# Patient Record
Sex: Female | Born: 1960 | Race: White | Hispanic: No | Smoking: Current every day smoker
Health system: Southern US, Community
[De-identification: ages and names within clinical notes are randomized; demographics above are authoritative.]

## PROBLEM LIST (undated history)

## (undated) DIAGNOSIS — R110 Nausea: Secondary | ICD-10-CM

## (undated) DIAGNOSIS — I209 Angina pectoris, unspecified: Secondary | ICD-10-CM

## (undated) DIAGNOSIS — K56609 Unspecified intestinal obstruction, unspecified as to partial versus complete obstruction: Secondary | ICD-10-CM

## (undated) DIAGNOSIS — G43909 Migraine, unspecified, not intractable, without status migrainosus: Secondary | ICD-10-CM

## (undated) DIAGNOSIS — IMO0002 Reserved for concepts with insufficient information to code with codable children: Secondary | ICD-10-CM

## (undated) DIAGNOSIS — Z9889 Other specified postprocedural states: Secondary | ICD-10-CM

## (undated) DIAGNOSIS — K589 Irritable bowel syndrome without diarrhea: Secondary | ICD-10-CM

## (undated) DIAGNOSIS — F4310 Post-traumatic stress disorder, unspecified: Secondary | ICD-10-CM

## (undated) DIAGNOSIS — F329 Major depressive disorder, single episode, unspecified: Secondary | ICD-10-CM

## (undated) DIAGNOSIS — F909 Attention-deficit hyperactivity disorder, unspecified type: Secondary | ICD-10-CM

## (undated) DIAGNOSIS — A4902 Methicillin resistant Staphylococcus aureus infection, unspecified site: Secondary | ICD-10-CM

## (undated) DIAGNOSIS — K449 Diaphragmatic hernia without obstruction or gangrene: Secondary | ICD-10-CM

## (undated) DIAGNOSIS — K219 Gastro-esophageal reflux disease without esophagitis: Secondary | ICD-10-CM

## (undated) DIAGNOSIS — F32A Depression, unspecified: Secondary | ICD-10-CM

## (undated) DIAGNOSIS — K623 Rectal prolapse: Secondary | ICD-10-CM

## (undated) DIAGNOSIS — J449 Chronic obstructive pulmonary disease, unspecified: Secondary | ICD-10-CM

## (undated) DIAGNOSIS — A0472 Enterocolitis due to Clostridium difficile, not specified as recurrent: Secondary | ICD-10-CM

## (undated) DIAGNOSIS — G8929 Other chronic pain: Secondary | ICD-10-CM

## (undated) DIAGNOSIS — K279 Peptic ulcer, site unspecified, unspecified as acute or chronic, without hemorrhage or perforation: Secondary | ICD-10-CM

## (undated) DIAGNOSIS — R109 Unspecified abdominal pain: Secondary | ICD-10-CM

## (undated) HISTORY — DX: Other chronic pain: G89.29

## (undated) HISTORY — DX: Methicillin resistant Staphylococcus aureus infection, unspecified site: A49.02

## (undated) HISTORY — PX: BACK SURGERY: SHX140

## (undated) HISTORY — PX: CERVICAL SPINE SURGERY: SHX589

## (undated) HISTORY — DX: Migraine, unspecified, not intractable, without status migrainosus: G43.909

## (undated) HISTORY — PX: BOWEL RESECTION: SHX1257

## (undated) HISTORY — DX: Diaphragmatic hernia without obstruction or gangrene: K44.9

## (undated) HISTORY — DX: Unspecified intestinal obstruction, unspecified as to partial versus complete obstruction: K56.609

## (undated) HISTORY — DX: Attention-deficit hyperactivity disorder, unspecified type: F90.9

## (undated) HISTORY — PX: JOINT REPLACEMENT: SHX530

## (undated) HISTORY — PX: PARTIAL HYSTERECTOMY: SHX80

## (undated) HISTORY — DX: Reserved for concepts with insufficient information to code with codable children: IMO0002

## (undated) HISTORY — DX: Other specified postprocedural states: Z98.890

## (undated) HISTORY — PX: TUBAL LIGATION: SHX77

## (undated) HISTORY — PX: UPPER GASTROINTESTINAL ENDOSCOPY: SHX188

## (undated) HISTORY — DX: Enterocolitis due to Clostridium difficile, not specified as recurrent: A04.72

## (undated) HISTORY — DX: Peptic ulcer, site unspecified, unspecified as acute or chronic, without hemorrhage or perforation: K27.9

## (undated) HISTORY — PX: ABDOMINAL HYSTERECTOMY: SHX81

## (undated) HISTORY — PX: APPENDECTOMY: SHX54

## (undated) HISTORY — PX: LAPAROSCOPIC LYSIS INTESTINAL ADHESIONS: SUR778

## (undated) HISTORY — DX: Post-traumatic stress disorder, unspecified: F43.10

## (undated) HISTORY — DX: Irritable bowel syndrome, unspecified: K58.9

## (undated) HISTORY — DX: Unspecified abdominal pain: R10.9

## (undated) HISTORY — DX: Rectal prolapse: K62.3

---

## 1998-02-19 ENCOUNTER — Inpatient Hospital Stay (HOSPITAL_COMMUNITY): Admission: AD | Admit: 1998-02-19 | Discharge: 1998-02-19 | Payer: Self-pay | Admitting: Obstetrics and Gynecology

## 1998-03-15 ENCOUNTER — Ambulatory Visit (HOSPITAL_COMMUNITY): Admission: RE | Admit: 1998-03-15 | Discharge: 1998-03-15 | Payer: Self-pay | Admitting: Gastroenterology

## 1998-05-01 ENCOUNTER — Emergency Department (HOSPITAL_COMMUNITY): Admission: EM | Admit: 1998-05-01 | Discharge: 1998-05-01 | Payer: Self-pay | Admitting: Emergency Medicine

## 1998-06-21 ENCOUNTER — Emergency Department (HOSPITAL_COMMUNITY): Admission: EM | Admit: 1998-06-21 | Discharge: 1998-06-21 | Payer: Self-pay | Admitting: Emergency Medicine

## 1998-06-23 ENCOUNTER — Emergency Department (HOSPITAL_COMMUNITY): Admission: EM | Admit: 1998-06-23 | Discharge: 1998-06-23 | Payer: Self-pay | Admitting: Emergency Medicine

## 1998-06-23 ENCOUNTER — Encounter: Payer: Self-pay | Admitting: Emergency Medicine

## 1998-06-27 ENCOUNTER — Inpatient Hospital Stay (HOSPITAL_COMMUNITY): Admission: EM | Admit: 1998-06-27 | Discharge: 1998-06-30 | Payer: Self-pay | Admitting: Critical Care Medicine

## 1998-07-02 ENCOUNTER — Emergency Department (HOSPITAL_COMMUNITY): Admission: EM | Admit: 1998-07-02 | Discharge: 1998-07-02 | Payer: Self-pay | Admitting: Emergency Medicine

## 1998-07-19 ENCOUNTER — Emergency Department (HOSPITAL_COMMUNITY): Admission: EM | Admit: 1998-07-19 | Discharge: 1998-07-19 | Payer: Self-pay | Admitting: Emergency Medicine

## 1998-07-25 ENCOUNTER — Emergency Department (HOSPITAL_COMMUNITY): Admission: EM | Admit: 1998-07-25 | Discharge: 1998-07-25 | Payer: Self-pay | Admitting: Emergency Medicine

## 1998-07-25 ENCOUNTER — Encounter: Payer: Self-pay | Admitting: Internal Medicine

## 1998-07-28 ENCOUNTER — Emergency Department (HOSPITAL_COMMUNITY): Admission: EM | Admit: 1998-07-28 | Discharge: 1998-07-28 | Payer: Self-pay | Admitting: Emergency Medicine

## 1998-08-23 ENCOUNTER — Encounter: Payer: Self-pay | Admitting: Emergency Medicine

## 1998-08-23 ENCOUNTER — Emergency Department (HOSPITAL_COMMUNITY): Admission: EM | Admit: 1998-08-23 | Discharge: 1998-08-23 | Payer: Self-pay | Admitting: Emergency Medicine

## 1998-10-20 ENCOUNTER — Emergency Department (HOSPITAL_COMMUNITY): Admission: EM | Admit: 1998-10-20 | Discharge: 1998-10-20 | Payer: Self-pay | Admitting: Emergency Medicine

## 1998-10-20 ENCOUNTER — Encounter: Payer: Self-pay | Admitting: Emergency Medicine

## 1998-10-24 ENCOUNTER — Emergency Department (HOSPITAL_COMMUNITY): Admission: EM | Admit: 1998-10-24 | Discharge: 1998-10-24 | Payer: Self-pay | Admitting: Emergency Medicine

## 1998-10-25 ENCOUNTER — Ambulatory Visit (HOSPITAL_COMMUNITY): Admission: RE | Admit: 1998-10-25 | Discharge: 1998-10-25 | Payer: Self-pay | Admitting: Gastroenterology

## 1998-10-25 ENCOUNTER — Encounter: Payer: Self-pay | Admitting: Emergency Medicine

## 1998-10-25 ENCOUNTER — Encounter: Payer: Self-pay | Admitting: Gastroenterology

## 1998-11-29 ENCOUNTER — Emergency Department (HOSPITAL_COMMUNITY): Admission: EM | Admit: 1998-11-29 | Discharge: 1998-11-29 | Payer: Self-pay | Admitting: Emergency Medicine

## 1998-12-25 ENCOUNTER — Encounter: Payer: Self-pay | Admitting: Emergency Medicine

## 1998-12-25 ENCOUNTER — Emergency Department (HOSPITAL_COMMUNITY): Admission: EM | Admit: 1998-12-25 | Discharge: 1998-12-25 | Payer: Self-pay | Admitting: Emergency Medicine

## 1999-01-11 ENCOUNTER — Ambulatory Visit (HOSPITAL_COMMUNITY): Admission: RE | Admit: 1999-01-11 | Discharge: 1999-01-11 | Payer: Self-pay | Admitting: *Deleted

## 1999-03-06 ENCOUNTER — Encounter: Payer: Self-pay | Admitting: Emergency Medicine

## 1999-03-06 ENCOUNTER — Emergency Department (HOSPITAL_COMMUNITY): Admission: EM | Admit: 1999-03-06 | Discharge: 1999-03-06 | Payer: Self-pay | Admitting: Emergency Medicine

## 1999-04-13 ENCOUNTER — Encounter: Payer: Self-pay | Admitting: Emergency Medicine

## 1999-04-13 ENCOUNTER — Emergency Department (HOSPITAL_COMMUNITY): Admission: EM | Admit: 1999-04-13 | Discharge: 1999-04-13 | Payer: Self-pay | Admitting: Emergency Medicine

## 1999-04-19 ENCOUNTER — Emergency Department (HOSPITAL_COMMUNITY): Admission: EM | Admit: 1999-04-19 | Discharge: 1999-04-19 | Payer: Self-pay | Admitting: *Deleted

## 1999-04-20 ENCOUNTER — Emergency Department (HOSPITAL_COMMUNITY): Admission: EM | Admit: 1999-04-20 | Discharge: 1999-04-20 | Payer: Self-pay | Admitting: Emergency Medicine

## 1999-04-20 ENCOUNTER — Encounter: Payer: Self-pay | Admitting: Emergency Medicine

## 1999-06-10 ENCOUNTER — Emergency Department (HOSPITAL_COMMUNITY): Admission: EM | Admit: 1999-06-10 | Discharge: 1999-06-10 | Payer: Self-pay

## 1999-06-25 ENCOUNTER — Emergency Department (HOSPITAL_COMMUNITY): Admission: EM | Admit: 1999-06-25 | Discharge: 1999-06-25 | Payer: Self-pay

## 1999-07-06 ENCOUNTER — Ambulatory Visit (HOSPITAL_COMMUNITY): Admission: RE | Admit: 1999-07-06 | Discharge: 1999-07-06 | Payer: Self-pay | Admitting: Sports Medicine

## 1999-07-06 ENCOUNTER — Encounter: Payer: Self-pay | Admitting: Sports Medicine

## 1999-07-21 ENCOUNTER — Encounter: Payer: Self-pay | Admitting: Emergency Medicine

## 1999-07-21 ENCOUNTER — Emergency Department (HOSPITAL_COMMUNITY): Admission: EM | Admit: 1999-07-21 | Discharge: 1999-07-21 | Payer: Self-pay | Admitting: Emergency Medicine

## 1999-07-22 ENCOUNTER — Emergency Department (HOSPITAL_COMMUNITY): Admission: EM | Admit: 1999-07-22 | Discharge: 1999-07-22 | Payer: Self-pay | Admitting: Emergency Medicine

## 1999-07-25 ENCOUNTER — Encounter: Payer: Self-pay | Admitting: Orthopedic Surgery

## 1999-07-25 ENCOUNTER — Observation Stay (HOSPITAL_COMMUNITY): Admission: RE | Admit: 1999-07-25 | Discharge: 1999-07-26 | Payer: Self-pay | Admitting: Orthopedic Surgery

## 1999-08-28 ENCOUNTER — Emergency Department (HOSPITAL_COMMUNITY): Admission: EM | Admit: 1999-08-28 | Discharge: 1999-08-28 | Payer: Self-pay | Admitting: Emergency Medicine

## 1999-08-29 ENCOUNTER — Inpatient Hospital Stay (HOSPITAL_COMMUNITY): Admission: EM | Admit: 1999-08-29 | Discharge: 1999-08-30 | Payer: Self-pay | Admitting: Emergency Medicine

## 1999-08-29 ENCOUNTER — Encounter: Payer: Self-pay | Admitting: Emergency Medicine

## 1999-09-11 ENCOUNTER — Encounter: Payer: Self-pay | Admitting: Obstetrics & Gynecology

## 1999-09-11 ENCOUNTER — Inpatient Hospital Stay (HOSPITAL_COMMUNITY): Admission: AD | Admit: 1999-09-11 | Discharge: 1999-09-11 | Payer: Self-pay | Admitting: Obstetrics & Gynecology

## 1999-09-22 ENCOUNTER — Inpatient Hospital Stay (HOSPITAL_COMMUNITY): Admission: EM | Admit: 1999-09-22 | Discharge: 1999-09-26 | Payer: Self-pay | Admitting: *Deleted

## 1999-10-12 ENCOUNTER — Other Ambulatory Visit: Admission: RE | Admit: 1999-10-12 | Discharge: 1999-10-12 | Payer: Self-pay | Admitting: *Deleted

## 1999-10-14 ENCOUNTER — Inpatient Hospital Stay (HOSPITAL_COMMUNITY): Admission: AD | Admit: 1999-10-14 | Discharge: 1999-10-14 | Payer: Self-pay | Admitting: Obstetrics and Gynecology

## 1999-10-15 ENCOUNTER — Encounter: Payer: Self-pay | Admitting: Obstetrics and Gynecology

## 1999-10-15 ENCOUNTER — Inpatient Hospital Stay (HOSPITAL_COMMUNITY): Admission: AD | Admit: 1999-10-15 | Discharge: 1999-10-15 | Payer: Self-pay | Admitting: Obstetrics and Gynecology

## 1999-11-30 ENCOUNTER — Inpatient Hospital Stay (HOSPITAL_COMMUNITY): Admission: AD | Admit: 1999-11-30 | Discharge: 1999-11-30 | Payer: Self-pay | Admitting: Obstetrics & Gynecology

## 1999-12-17 ENCOUNTER — Inpatient Hospital Stay (HOSPITAL_COMMUNITY): Admission: AD | Admit: 1999-12-17 | Discharge: 1999-12-17 | Payer: Self-pay | Admitting: Obstetrics and Gynecology

## 2000-01-01 ENCOUNTER — Inpatient Hospital Stay (HOSPITAL_COMMUNITY): Admission: AD | Admit: 2000-01-01 | Discharge: 2000-01-01 | Payer: Self-pay | Admitting: Obstetrics and Gynecology

## 2000-01-04 ENCOUNTER — Inpatient Hospital Stay (HOSPITAL_COMMUNITY): Admission: AD | Admit: 2000-01-04 | Discharge: 2000-01-04 | Payer: Self-pay | Admitting: Obstetrics and Gynecology

## 2000-02-05 ENCOUNTER — Inpatient Hospital Stay (HOSPITAL_COMMUNITY): Admission: AD | Admit: 2000-02-05 | Discharge: 2000-02-05 | Payer: Self-pay | Admitting: Obstetrics and Gynecology

## 2000-02-06 ENCOUNTER — Inpatient Hospital Stay (HOSPITAL_COMMUNITY): Admission: AD | Admit: 2000-02-06 | Discharge: 2000-02-10 | Payer: Self-pay | Admitting: Obstetrics and Gynecology

## 2000-02-07 ENCOUNTER — Encounter: Payer: Self-pay | Admitting: Obstetrics and Gynecology

## 2000-02-08 ENCOUNTER — Encounter: Payer: Self-pay | Admitting: Obstetrics and Gynecology

## 2000-02-11 ENCOUNTER — Encounter: Payer: Self-pay | Admitting: Obstetrics and Gynecology

## 2000-02-11 ENCOUNTER — Observation Stay (HOSPITAL_COMMUNITY): Admission: AD | Admit: 2000-02-11 | Discharge: 2000-02-12 | Payer: Self-pay | Admitting: Obstetrics and Gynecology

## 2000-02-22 ENCOUNTER — Inpatient Hospital Stay (HOSPITAL_COMMUNITY): Admission: AD | Admit: 2000-02-22 | Discharge: 2000-02-24 | Payer: Self-pay | Admitting: Obstetrics and Gynecology

## 2000-03-25 ENCOUNTER — Emergency Department (HOSPITAL_COMMUNITY): Admission: EM | Admit: 2000-03-25 | Discharge: 2000-03-25 | Payer: Self-pay

## 2000-03-30 ENCOUNTER — Inpatient Hospital Stay (HOSPITAL_COMMUNITY): Admission: EM | Admit: 2000-03-30 | Discharge: 2000-04-04 | Payer: Self-pay | Admitting: *Deleted

## 2000-04-04 ENCOUNTER — Inpatient Hospital Stay (HOSPITAL_COMMUNITY): Admission: AD | Admit: 2000-04-04 | Discharge: 2000-04-04 | Payer: Self-pay | Admitting: Obstetrics and Gynecology

## 2000-04-06 ENCOUNTER — Inpatient Hospital Stay (HOSPITAL_COMMUNITY): Admission: AD | Admit: 2000-04-06 | Discharge: 2000-04-06 | Payer: Self-pay | Admitting: Obstetrics and Gynecology

## 2000-04-09 ENCOUNTER — Observation Stay (HOSPITAL_COMMUNITY): Admission: AD | Admit: 2000-04-09 | Discharge: 2000-04-09 | Payer: Self-pay | Admitting: Obstetrics and Gynecology

## 2000-04-09 ENCOUNTER — Encounter: Payer: Self-pay | Admitting: Obstetrics and Gynecology

## 2000-04-10 ENCOUNTER — Inpatient Hospital Stay (HOSPITAL_COMMUNITY): Admission: AD | Admit: 2000-04-10 | Discharge: 2000-04-10 | Payer: Self-pay | Admitting: Obstetrics and Gynecology

## 2000-04-11 ENCOUNTER — Ambulatory Visit (HOSPITAL_COMMUNITY): Admission: RE | Admit: 2000-04-11 | Discharge: 2000-04-11 | Payer: Self-pay | Admitting: *Deleted

## 2000-04-11 ENCOUNTER — Inpatient Hospital Stay (HOSPITAL_COMMUNITY): Admission: AD | Admit: 2000-04-11 | Discharge: 2000-04-14 | Payer: Self-pay | Admitting: Obstetrics & Gynecology

## 2000-04-11 ENCOUNTER — Encounter (INDEPENDENT_AMBULATORY_CARE_PROVIDER_SITE_OTHER): Payer: Self-pay

## 2000-06-19 ENCOUNTER — Other Ambulatory Visit: Admission: RE | Admit: 2000-06-19 | Discharge: 2000-06-19 | Payer: Self-pay | Admitting: *Deleted

## 2000-06-30 ENCOUNTER — Encounter: Payer: Self-pay | Admitting: Emergency Medicine

## 2000-06-30 ENCOUNTER — Emergency Department (HOSPITAL_COMMUNITY): Admission: EM | Admit: 2000-06-30 | Discharge: 2000-06-30 | Payer: Self-pay | Admitting: Emergency Medicine

## 2000-07-11 ENCOUNTER — Emergency Department (HOSPITAL_COMMUNITY): Admission: EM | Admit: 2000-07-11 | Discharge: 2000-07-11 | Payer: Self-pay | Admitting: Emergency Medicine

## 2000-07-11 ENCOUNTER — Encounter: Payer: Self-pay | Admitting: Emergency Medicine

## 2000-09-02 ENCOUNTER — Inpatient Hospital Stay (HOSPITAL_COMMUNITY): Admission: AD | Admit: 2000-09-02 | Discharge: 2000-09-02 | Payer: Self-pay | Admitting: Obstetrics and Gynecology

## 2000-09-02 ENCOUNTER — Encounter: Payer: Self-pay | Admitting: Obstetrics and Gynecology

## 2000-09-03 ENCOUNTER — Encounter
Admission: RE | Admit: 2000-09-03 | Discharge: 2000-09-25 | Payer: Self-pay | Admitting: Physical Medicine and Rehabilitation

## 2000-09-21 ENCOUNTER — Encounter: Payer: Self-pay | Admitting: Emergency Medicine

## 2000-09-21 ENCOUNTER — Emergency Department (HOSPITAL_COMMUNITY): Admission: EM | Admit: 2000-09-21 | Discharge: 2000-09-22 | Payer: Self-pay | Admitting: Emergency Medicine

## 2000-12-16 ENCOUNTER — Encounter: Payer: Self-pay | Admitting: Emergency Medicine

## 2000-12-16 ENCOUNTER — Emergency Department (HOSPITAL_COMMUNITY): Admission: EM | Admit: 2000-12-16 | Discharge: 2000-12-16 | Payer: Self-pay | Admitting: Emergency Medicine

## 2000-12-29 ENCOUNTER — Emergency Department (HOSPITAL_COMMUNITY): Admission: EM | Admit: 2000-12-29 | Discharge: 2000-12-30 | Payer: Self-pay | Admitting: *Deleted

## 2000-12-30 ENCOUNTER — Encounter: Payer: Self-pay | Admitting: Emergency Medicine

## 2001-01-05 ENCOUNTER — Encounter: Payer: Self-pay | Admitting: Emergency Medicine

## 2001-01-05 ENCOUNTER — Emergency Department (HOSPITAL_COMMUNITY): Admission: EM | Admit: 2001-01-05 | Discharge: 2001-01-05 | Payer: Self-pay | Admitting: Emergency Medicine

## 2001-01-08 ENCOUNTER — Emergency Department (HOSPITAL_COMMUNITY): Admission: EM | Admit: 2001-01-08 | Discharge: 2001-01-09 | Payer: Self-pay | Admitting: Emergency Medicine

## 2001-01-13 ENCOUNTER — Emergency Department (HOSPITAL_COMMUNITY): Admission: EM | Admit: 2001-01-13 | Discharge: 2001-01-13 | Payer: Self-pay | Admitting: Emergency Medicine

## 2001-01-20 ENCOUNTER — Encounter: Payer: Self-pay | Admitting: Neurosurgery

## 2001-01-20 ENCOUNTER — Encounter: Admission: RE | Admit: 2001-01-20 | Discharge: 2001-01-20 | Payer: Self-pay | Admitting: Neurosurgery

## 2001-02-03 ENCOUNTER — Inpatient Hospital Stay (HOSPITAL_COMMUNITY): Admission: EM | Admit: 2001-02-03 | Discharge: 2001-02-12 | Payer: Self-pay | Admitting: *Deleted

## 2001-02-11 ENCOUNTER — Encounter: Payer: Self-pay | Admitting: Emergency Medicine

## 2001-02-14 ENCOUNTER — Inpatient Hospital Stay (HOSPITAL_COMMUNITY): Admission: EM | Admit: 2001-02-14 | Discharge: 2001-02-23 | Payer: Self-pay | Admitting: *Deleted

## 2001-02-25 ENCOUNTER — Emergency Department (HOSPITAL_COMMUNITY): Admission: EM | Admit: 2001-02-25 | Discharge: 2001-02-25 | Payer: Self-pay | Admitting: *Deleted

## 2001-03-05 ENCOUNTER — Emergency Department (HOSPITAL_COMMUNITY): Admission: EM | Admit: 2001-03-05 | Discharge: 2001-03-06 | Payer: Self-pay | Admitting: Emergency Medicine

## 2001-03-06 ENCOUNTER — Encounter: Payer: Self-pay | Admitting: Emergency Medicine

## 2001-03-07 ENCOUNTER — Emergency Department (HOSPITAL_COMMUNITY): Admission: EM | Admit: 2001-03-07 | Discharge: 2001-03-07 | Payer: Self-pay | Admitting: Emergency Medicine

## 2001-03-09 ENCOUNTER — Encounter: Payer: Self-pay | Admitting: Emergency Medicine

## 2001-03-09 ENCOUNTER — Inpatient Hospital Stay (HOSPITAL_COMMUNITY): Admission: EM | Admit: 2001-03-09 | Discharge: 2001-03-11 | Payer: Self-pay | Admitting: Emergency Medicine

## 2001-03-23 ENCOUNTER — Emergency Department (HOSPITAL_COMMUNITY): Admission: EM | Admit: 2001-03-23 | Discharge: 2001-03-24 | Payer: Self-pay | Admitting: *Deleted

## 2001-03-24 ENCOUNTER — Encounter: Payer: Self-pay | Admitting: Emergency Medicine

## 2001-03-26 ENCOUNTER — Emergency Department (HOSPITAL_COMMUNITY): Admission: EM | Admit: 2001-03-26 | Discharge: 2001-03-27 | Payer: Self-pay | Admitting: Orthopedic Surgery

## 2001-04-03 ENCOUNTER — Encounter: Payer: Self-pay | Admitting: Pediatrics

## 2001-04-03 ENCOUNTER — Inpatient Hospital Stay (HOSPITAL_COMMUNITY): Admission: EM | Admit: 2001-04-03 | Discharge: 2001-04-16 | Payer: Self-pay | Admitting: Emergency Medicine

## 2001-04-11 ENCOUNTER — Encounter: Payer: Self-pay | Admitting: *Deleted

## 2001-04-13 ENCOUNTER — Encounter: Payer: Self-pay | Admitting: Gastroenterology

## 2001-04-15 ENCOUNTER — Encounter: Payer: Self-pay | Admitting: Pediatrics

## 2001-04-23 ENCOUNTER — Encounter: Payer: Self-pay | Admitting: Emergency Medicine

## 2001-04-23 ENCOUNTER — Emergency Department (HOSPITAL_COMMUNITY): Admission: EM | Admit: 2001-04-23 | Discharge: 2001-04-23 | Payer: Self-pay | Admitting: Emergency Medicine

## 2001-04-25 ENCOUNTER — Emergency Department (HOSPITAL_COMMUNITY): Admission: EM | Admit: 2001-04-25 | Discharge: 2001-04-25 | Payer: Self-pay | Admitting: Emergency Medicine

## 2001-04-25 ENCOUNTER — Encounter: Payer: Self-pay | Admitting: Emergency Medicine

## 2001-04-25 ENCOUNTER — Ambulatory Visit (HOSPITAL_COMMUNITY): Admission: RE | Admit: 2001-04-25 | Discharge: 2001-04-25 | Payer: Self-pay | Admitting: Gastroenterology

## 2001-04-28 ENCOUNTER — Emergency Department (HOSPITAL_COMMUNITY): Admission: EM | Admit: 2001-04-28 | Discharge: 2001-04-28 | Payer: Self-pay | Admitting: Emergency Medicine

## 2001-04-30 ENCOUNTER — Encounter: Payer: Self-pay | Admitting: Gastroenterology

## 2001-04-30 ENCOUNTER — Ambulatory Visit (HOSPITAL_COMMUNITY): Admission: RE | Admit: 2001-04-30 | Discharge: 2001-04-30 | Payer: Self-pay | Admitting: Gastroenterology

## 2001-05-05 ENCOUNTER — Emergency Department (HOSPITAL_COMMUNITY): Admission: EM | Admit: 2001-05-05 | Discharge: 2001-05-05 | Payer: Self-pay | Admitting: Emergency Medicine

## 2001-05-12 ENCOUNTER — Encounter: Payer: Self-pay | Admitting: Emergency Medicine

## 2001-05-12 ENCOUNTER — Emergency Department (HOSPITAL_COMMUNITY): Admission: EM | Admit: 2001-05-12 | Discharge: 2001-05-12 | Payer: Self-pay | Admitting: Emergency Medicine

## 2001-05-22 ENCOUNTER — Emergency Department (HOSPITAL_COMMUNITY): Admission: EM | Admit: 2001-05-22 | Discharge: 2001-05-22 | Payer: Self-pay | Admitting: Internal Medicine

## 2001-06-01 ENCOUNTER — Encounter: Payer: Self-pay | Admitting: Emergency Medicine

## 2001-06-01 ENCOUNTER — Emergency Department (HOSPITAL_COMMUNITY): Admission: EM | Admit: 2001-06-01 | Discharge: 2001-06-01 | Payer: Self-pay | Admitting: Emergency Medicine

## 2001-06-02 ENCOUNTER — Emergency Department (HOSPITAL_COMMUNITY): Admission: EM | Admit: 2001-06-02 | Discharge: 2001-06-03 | Payer: Self-pay | Admitting: Emergency Medicine

## 2001-08-20 ENCOUNTER — Encounter: Payer: Self-pay | Admitting: Obstetrics

## 2001-08-20 ENCOUNTER — Inpatient Hospital Stay (HOSPITAL_COMMUNITY): Admission: AD | Admit: 2001-08-20 | Discharge: 2001-08-20 | Payer: Self-pay | Admitting: Obstetrics

## 2001-08-30 ENCOUNTER — Inpatient Hospital Stay (HOSPITAL_COMMUNITY): Admission: AD | Admit: 2001-08-30 | Discharge: 2001-08-30 | Payer: Self-pay | Admitting: *Deleted

## 2001-09-11 ENCOUNTER — Inpatient Hospital Stay (HOSPITAL_COMMUNITY): Admission: AD | Admit: 2001-09-11 | Discharge: 2001-09-11 | Payer: Self-pay | Admitting: Obstetrics and Gynecology

## 2001-09-18 ENCOUNTER — Other Ambulatory Visit: Admission: RE | Admit: 2001-09-18 | Discharge: 2001-09-18 | Payer: Self-pay | Admitting: Obstetrics and Gynecology

## 2001-10-04 ENCOUNTER — Inpatient Hospital Stay (HOSPITAL_COMMUNITY): Admission: EM | Admit: 2001-10-04 | Discharge: 2001-10-06 | Payer: Self-pay | Admitting: *Deleted

## 2001-10-04 ENCOUNTER — Encounter: Payer: Self-pay | Admitting: Emergency Medicine

## 2001-10-08 DIAGNOSIS — G8929 Other chronic pain: Secondary | ICD-10-CM

## 2001-10-08 HISTORY — DX: Other chronic pain: G89.29

## 2002-02-26 ENCOUNTER — Inpatient Hospital Stay (HOSPITAL_COMMUNITY): Admission: EM | Admit: 2002-02-26 | Discharge: 2002-03-10 | Payer: Self-pay | Admitting: Psychiatry

## 2002-05-15 ENCOUNTER — Emergency Department (HOSPITAL_COMMUNITY): Admission: EM | Admit: 2002-05-15 | Discharge: 2002-05-15 | Payer: Self-pay | Admitting: Emergency Medicine

## 2002-05-16 ENCOUNTER — Encounter: Payer: Self-pay | Admitting: Emergency Medicine

## 2002-06-04 ENCOUNTER — Emergency Department (HOSPITAL_COMMUNITY): Admission: EM | Admit: 2002-06-04 | Discharge: 2002-06-04 | Payer: Self-pay

## 2002-06-13 ENCOUNTER — Encounter: Payer: Self-pay | Admitting: Emergency Medicine

## 2002-06-13 ENCOUNTER — Emergency Department (HOSPITAL_COMMUNITY): Admission: EM | Admit: 2002-06-13 | Discharge: 2002-06-13 | Payer: Self-pay | Admitting: *Deleted

## 2002-07-04 ENCOUNTER — Emergency Department (HOSPITAL_COMMUNITY): Admission: EM | Admit: 2002-07-04 | Discharge: 2002-07-04 | Payer: Self-pay | Admitting: Emergency Medicine

## 2002-07-14 ENCOUNTER — Encounter: Payer: Self-pay | Admitting: Gastroenterology

## 2002-07-14 ENCOUNTER — Ambulatory Visit (HOSPITAL_COMMUNITY): Admission: RE | Admit: 2002-07-14 | Discharge: 2002-07-14 | Payer: Self-pay | Admitting: Gastroenterology

## 2002-07-14 HISTORY — PX: FLEXIBLE SIGMOIDOSCOPY: SHX1649

## 2002-07-18 ENCOUNTER — Encounter: Payer: Self-pay | Admitting: Emergency Medicine

## 2002-07-18 ENCOUNTER — Emergency Department (HOSPITAL_COMMUNITY): Admission: EM | Admit: 2002-07-18 | Discharge: 2002-07-18 | Payer: Self-pay | Admitting: Emergency Medicine

## 2002-08-01 ENCOUNTER — Encounter: Payer: Self-pay | Admitting: Emergency Medicine

## 2002-08-01 ENCOUNTER — Emergency Department (HOSPITAL_COMMUNITY): Admission: EM | Admit: 2002-08-01 | Discharge: 2002-08-01 | Payer: Self-pay | Admitting: Emergency Medicine

## 2002-08-07 ENCOUNTER — Emergency Department (HOSPITAL_COMMUNITY): Admission: EM | Admit: 2002-08-07 | Discharge: 2002-08-07 | Payer: Self-pay | Admitting: Emergency Medicine

## 2002-09-12 ENCOUNTER — Emergency Department (HOSPITAL_COMMUNITY): Admission: EM | Admit: 2002-09-12 | Discharge: 2002-09-13 | Payer: Self-pay | Admitting: Emergency Medicine

## 2002-10-04 ENCOUNTER — Emergency Department (HOSPITAL_COMMUNITY): Admission: EM | Admit: 2002-10-04 | Discharge: 2002-10-04 | Payer: Self-pay | Admitting: Emergency Medicine

## 2003-01-08 ENCOUNTER — Encounter: Payer: Self-pay | Admitting: Emergency Medicine

## 2003-01-08 ENCOUNTER — Emergency Department (HOSPITAL_COMMUNITY): Admission: EM | Admit: 2003-01-08 | Discharge: 2003-01-08 | Payer: Self-pay | Admitting: Emergency Medicine

## 2003-01-25 ENCOUNTER — Emergency Department (HOSPITAL_COMMUNITY): Admission: EM | Admit: 2003-01-25 | Discharge: 2003-01-26 | Payer: Self-pay

## 2003-02-01 ENCOUNTER — Inpatient Hospital Stay (HOSPITAL_COMMUNITY): Admission: EM | Admit: 2003-02-01 | Discharge: 2003-02-03 | Payer: Self-pay

## 2003-02-01 ENCOUNTER — Encounter: Payer: Self-pay | Admitting: General Surgery

## 2003-02-01 ENCOUNTER — Encounter: Payer: Self-pay | Admitting: Emergency Medicine

## 2003-02-09 ENCOUNTER — Other Ambulatory Visit: Admission: RE | Admit: 2003-02-09 | Discharge: 2003-02-09 | Payer: Self-pay | Admitting: Obstetrics and Gynecology

## 2003-02-14 ENCOUNTER — Emergency Department (HOSPITAL_COMMUNITY): Admission: EM | Admit: 2003-02-14 | Discharge: 2003-02-14 | Payer: Self-pay | Admitting: Emergency Medicine

## 2003-02-14 ENCOUNTER — Encounter: Payer: Self-pay | Admitting: Emergency Medicine

## 2003-02-16 ENCOUNTER — Emergency Department (HOSPITAL_COMMUNITY): Admission: EM | Admit: 2003-02-16 | Discharge: 2003-02-17 | Payer: Self-pay | Admitting: Emergency Medicine

## 2003-02-27 ENCOUNTER — Inpatient Hospital Stay (HOSPITAL_COMMUNITY): Admission: AD | Admit: 2003-02-27 | Discharge: 2003-02-27 | Payer: Self-pay | Admitting: Obstetrics and Gynecology

## 2003-03-04 ENCOUNTER — Emergency Department (HOSPITAL_COMMUNITY): Admission: EM | Admit: 2003-03-04 | Discharge: 2003-03-04 | Payer: Self-pay | Admitting: Emergency Medicine

## 2003-04-19 ENCOUNTER — Ambulatory Visit (HOSPITAL_COMMUNITY): Admission: RE | Admit: 2003-04-19 | Discharge: 2003-04-19 | Payer: Self-pay | Admitting: Obstetrics and Gynecology

## 2003-04-19 ENCOUNTER — Encounter (INDEPENDENT_AMBULATORY_CARE_PROVIDER_SITE_OTHER): Payer: Self-pay | Admitting: Specialist

## 2003-05-04 ENCOUNTER — Encounter: Payer: Self-pay | Admitting: Emergency Medicine

## 2003-05-04 ENCOUNTER — Emergency Department (HOSPITAL_COMMUNITY): Admission: EM | Admit: 2003-05-04 | Discharge: 2003-05-04 | Payer: Self-pay | Admitting: Emergency Medicine

## 2003-07-11 ENCOUNTER — Emergency Department (HOSPITAL_COMMUNITY): Admission: EM | Admit: 2003-07-11 | Discharge: 2003-07-11 | Payer: Self-pay | Admitting: Emergency Medicine

## 2003-07-11 ENCOUNTER — Encounter: Payer: Self-pay | Admitting: Emergency Medicine

## 2003-07-30 ENCOUNTER — Emergency Department (HOSPITAL_COMMUNITY): Admission: AD | Admit: 2003-07-30 | Discharge: 2003-07-30 | Payer: Self-pay | Admitting: Family Medicine

## 2003-09-17 ENCOUNTER — Inpatient Hospital Stay (HOSPITAL_COMMUNITY): Admission: EM | Admit: 2003-09-17 | Discharge: 2003-09-20 | Payer: Self-pay | Admitting: *Deleted

## 2003-12-13 ENCOUNTER — Ambulatory Visit (HOSPITAL_COMMUNITY): Admission: RE | Admit: 2003-12-13 | Discharge: 2003-12-13 | Payer: Self-pay | Admitting: Internal Medicine

## 2006-02-06 ENCOUNTER — Encounter: Payer: Self-pay | Admitting: Emergency Medicine

## 2006-10-08 DIAGNOSIS — K449 Diaphragmatic hernia without obstruction or gangrene: Secondary | ICD-10-CM

## 2006-10-08 HISTORY — PX: UMBILICAL HERNIA REPAIR: SHX196

## 2006-10-08 HISTORY — DX: Diaphragmatic hernia without obstruction or gangrene: K44.9

## 2006-12-07 ENCOUNTER — Emergency Department (HOSPITAL_COMMUNITY): Admission: EM | Admit: 2006-12-07 | Discharge: 2006-12-07 | Payer: Self-pay | Admitting: Emergency Medicine

## 2006-12-31 ENCOUNTER — Emergency Department (HOSPITAL_COMMUNITY): Admission: EM | Admit: 2006-12-31 | Discharge: 2006-12-31 | Payer: Self-pay | Admitting: Emergency Medicine

## 2007-01-07 ENCOUNTER — Inpatient Hospital Stay (HOSPITAL_COMMUNITY): Admission: EM | Admit: 2007-01-07 | Discharge: 2007-01-09 | Payer: Self-pay | Admitting: Emergency Medicine

## 2007-01-07 DIAGNOSIS — K279 Peptic ulcer, site unspecified, unspecified as acute or chronic, without hemorrhage or perforation: Secondary | ICD-10-CM

## 2007-01-07 HISTORY — DX: Peptic ulcer, site unspecified, unspecified as acute or chronic, without hemorrhage or perforation: K27.9

## 2007-01-08 ENCOUNTER — Ambulatory Visit: Payer: Self-pay | Admitting: Internal Medicine

## 2007-01-08 ENCOUNTER — Encounter (INDEPENDENT_AMBULATORY_CARE_PROVIDER_SITE_OTHER): Payer: Self-pay | Admitting: Specialist

## 2007-01-13 ENCOUNTER — Emergency Department (HOSPITAL_COMMUNITY): Admission: EM | Admit: 2007-01-13 | Discharge: 2007-01-14 | Payer: Self-pay | Admitting: Emergency Medicine

## 2007-02-01 ENCOUNTER — Emergency Department (HOSPITAL_COMMUNITY): Admission: EM | Admit: 2007-02-01 | Discharge: 2007-02-01 | Payer: Self-pay | Admitting: Emergency Medicine

## 2007-02-06 ENCOUNTER — Emergency Department (HOSPITAL_COMMUNITY): Admission: EM | Admit: 2007-02-06 | Discharge: 2007-02-07 | Payer: Self-pay | Admitting: Emergency Medicine

## 2007-05-03 ENCOUNTER — Emergency Department (HOSPITAL_COMMUNITY): Admission: EM | Admit: 2007-05-03 | Discharge: 2007-05-04 | Payer: Self-pay | Admitting: Emergency Medicine

## 2007-05-09 ENCOUNTER — Encounter (INDEPENDENT_AMBULATORY_CARE_PROVIDER_SITE_OTHER): Payer: Self-pay | Admitting: Family Medicine

## 2007-05-09 ENCOUNTER — Ambulatory Visit: Payer: Self-pay | Admitting: Internal Medicine

## 2007-05-09 DIAGNOSIS — Z8719 Personal history of other diseases of the digestive system: Secondary | ICD-10-CM | POA: Insufficient documentation

## 2007-05-09 DIAGNOSIS — M545 Low back pain, unspecified: Secondary | ICD-10-CM | POA: Insufficient documentation

## 2007-05-09 DIAGNOSIS — K219 Gastro-esophageal reflux disease without esophagitis: Secondary | ICD-10-CM | POA: Insufficient documentation

## 2007-05-09 DIAGNOSIS — M542 Cervicalgia: Secondary | ICD-10-CM | POA: Insufficient documentation

## 2007-05-09 DIAGNOSIS — F329 Major depressive disorder, single episode, unspecified: Secondary | ICD-10-CM

## 2007-05-09 DIAGNOSIS — J309 Allergic rhinitis, unspecified: Secondary | ICD-10-CM | POA: Insufficient documentation

## 2007-05-09 DIAGNOSIS — F418 Other specified anxiety disorders: Secondary | ICD-10-CM | POA: Insufficient documentation

## 2007-05-09 DIAGNOSIS — J45909 Unspecified asthma, uncomplicated: Secondary | ICD-10-CM | POA: Insufficient documentation

## 2007-05-09 DIAGNOSIS — K279 Peptic ulcer, site unspecified, unspecified as acute or chronic, without hemorrhage or perforation: Secondary | ICD-10-CM | POA: Insufficient documentation

## 2007-05-09 DIAGNOSIS — M25559 Pain in unspecified hip: Secondary | ICD-10-CM | POA: Insufficient documentation

## 2007-05-09 DIAGNOSIS — Z9119 Patient's noncompliance with other medical treatment and regimen: Secondary | ICD-10-CM | POA: Insufficient documentation

## 2007-05-09 DIAGNOSIS — Z91199 Patient's noncompliance with other medical treatment and regimen due to unspecified reason: Secondary | ICD-10-CM | POA: Insufficient documentation

## 2007-05-09 DIAGNOSIS — K589 Irritable bowel syndrome without diarrhea: Secondary | ICD-10-CM | POA: Insufficient documentation

## 2007-05-09 DIAGNOSIS — M129 Arthropathy, unspecified: Secondary | ICD-10-CM | POA: Insufficient documentation

## 2007-05-16 ENCOUNTER — Ambulatory Visit: Payer: Self-pay | Admitting: Internal Medicine

## 2007-05-16 ENCOUNTER — Telehealth (INDEPENDENT_AMBULATORY_CARE_PROVIDER_SITE_OTHER): Payer: Self-pay | Admitting: *Deleted

## 2007-05-19 ENCOUNTER — Encounter (INDEPENDENT_AMBULATORY_CARE_PROVIDER_SITE_OTHER): Payer: Self-pay | Admitting: Internal Medicine

## 2007-05-23 ENCOUNTER — Emergency Department (HOSPITAL_COMMUNITY): Admission: EM | Admit: 2007-05-23 | Discharge: 2007-05-23 | Payer: Self-pay | Admitting: Emergency Medicine

## 2007-05-24 ENCOUNTER — Emergency Department (HOSPITAL_COMMUNITY): Admission: EM | Admit: 2007-05-24 | Discharge: 2007-05-25 | Payer: Self-pay | Admitting: Anesthesiology

## 2007-05-27 ENCOUNTER — Encounter (INDEPENDENT_AMBULATORY_CARE_PROVIDER_SITE_OTHER): Payer: Self-pay | Admitting: Internal Medicine

## 2007-06-21 ENCOUNTER — Emergency Department (HOSPITAL_COMMUNITY): Admission: EM | Admit: 2007-06-21 | Discharge: 2007-06-21 | Payer: Self-pay | Admitting: Emergency Medicine

## 2007-06-24 ENCOUNTER — Ambulatory Visit: Payer: Self-pay | Admitting: Internal Medicine

## 2007-06-27 ENCOUNTER — Emergency Department (HOSPITAL_COMMUNITY): Admission: EM | Admit: 2007-06-27 | Discharge: 2007-06-27 | Payer: Self-pay | Admitting: Emergency Medicine

## 2007-07-04 ENCOUNTER — Encounter (INDEPENDENT_AMBULATORY_CARE_PROVIDER_SITE_OTHER): Payer: Self-pay | Admitting: Internal Medicine

## 2007-07-07 ENCOUNTER — Encounter (INDEPENDENT_AMBULATORY_CARE_PROVIDER_SITE_OTHER): Payer: Self-pay | Admitting: Family Medicine

## 2007-07-29 ENCOUNTER — Emergency Department (HOSPITAL_COMMUNITY): Admission: EM | Admit: 2007-07-29 | Discharge: 2007-07-29 | Payer: Self-pay | Admitting: Emergency Medicine

## 2007-08-01 ENCOUNTER — Emergency Department (HOSPITAL_COMMUNITY): Admission: EM | Admit: 2007-08-01 | Discharge: 2007-08-01 | Payer: Self-pay | Admitting: Emergency Medicine

## 2007-08-05 ENCOUNTER — Encounter (INDEPENDENT_AMBULATORY_CARE_PROVIDER_SITE_OTHER): Payer: Self-pay | Admitting: Internal Medicine

## 2007-08-31 ENCOUNTER — Emergency Department (HOSPITAL_COMMUNITY): Admission: EM | Admit: 2007-08-31 | Discharge: 2007-08-31 | Payer: Self-pay | Admitting: Emergency Medicine

## 2007-09-04 ENCOUNTER — Emergency Department (HOSPITAL_COMMUNITY): Admission: EM | Admit: 2007-09-04 | Discharge: 2007-09-04 | Payer: Self-pay | Admitting: Emergency Medicine

## 2007-09-09 ENCOUNTER — Inpatient Hospital Stay (HOSPITAL_COMMUNITY): Admission: RE | Admit: 2007-09-09 | Discharge: 2007-09-10 | Payer: Self-pay | Admitting: General Surgery

## 2007-09-16 ENCOUNTER — Emergency Department (HOSPITAL_COMMUNITY): Admission: EM | Admit: 2007-09-16 | Discharge: 2007-09-16 | Payer: Self-pay | Admitting: Emergency Medicine

## 2007-09-27 ENCOUNTER — Emergency Department (HOSPITAL_COMMUNITY): Admission: EM | Admit: 2007-09-27 | Discharge: 2007-09-27 | Payer: Self-pay | Admitting: Emergency Medicine

## 2007-10-06 ENCOUNTER — Emergency Department (HOSPITAL_COMMUNITY): Admission: EM | Admit: 2007-10-06 | Discharge: 2007-10-06 | Payer: Self-pay | Admitting: Emergency Medicine

## 2007-10-09 HISTORY — PX: NECK SURGERY: SHX720

## 2007-11-08 ENCOUNTER — Emergency Department (HOSPITAL_COMMUNITY): Admission: EM | Admit: 2007-11-08 | Discharge: 2007-11-08 | Payer: Self-pay | Admitting: Emergency Medicine

## 2007-11-10 ENCOUNTER — Ambulatory Visit (HOSPITAL_COMMUNITY): Admission: RE | Admit: 2007-11-10 | Discharge: 2007-11-10 | Payer: Self-pay | Admitting: Family Medicine

## 2007-11-19 ENCOUNTER — Ambulatory Visit (HOSPITAL_COMMUNITY): Admission: RE | Admit: 2007-11-19 | Discharge: 2007-11-19 | Payer: Self-pay | Admitting: Family Medicine

## 2007-11-22 ENCOUNTER — Emergency Department (HOSPITAL_COMMUNITY): Admission: EM | Admit: 2007-11-22 | Discharge: 2007-11-22 | Payer: Self-pay | Admitting: Emergency Medicine

## 2007-12-31 ENCOUNTER — Emergency Department (HOSPITAL_COMMUNITY): Admission: EM | Admit: 2007-12-31 | Discharge: 2007-12-31 | Payer: Self-pay | Admitting: Emergency Medicine

## 2008-01-28 ENCOUNTER — Encounter: Admission: RE | Admit: 2008-01-28 | Discharge: 2008-01-28 | Payer: Self-pay | Admitting: Specialist

## 2008-02-11 ENCOUNTER — Ambulatory Visit (HOSPITAL_COMMUNITY): Admission: RE | Admit: 2008-02-11 | Discharge: 2008-02-11 | Payer: Self-pay | Admitting: Specialist

## 2008-02-28 ENCOUNTER — Ambulatory Visit: Payer: Self-pay | Admitting: Cardiology

## 2008-03-01 ENCOUNTER — Emergency Department (HOSPITAL_COMMUNITY): Admission: EM | Admit: 2008-03-01 | Discharge: 2008-03-01 | Payer: Self-pay | Admitting: Emergency Medicine

## 2008-03-20 ENCOUNTER — Emergency Department (HOSPITAL_COMMUNITY): Admission: EM | Admit: 2008-03-20 | Discharge: 2008-03-20 | Payer: Self-pay | Admitting: Emergency Medicine

## 2008-04-30 ENCOUNTER — Inpatient Hospital Stay (HOSPITAL_COMMUNITY): Admission: RE | Admit: 2008-04-30 | Discharge: 2008-05-03 | Payer: Self-pay | Admitting: Specialist

## 2008-05-04 ENCOUNTER — Emergency Department (HOSPITAL_COMMUNITY): Admission: EM | Admit: 2008-05-04 | Discharge: 2008-05-04 | Payer: Self-pay | Admitting: Emergency Medicine

## 2008-05-08 ENCOUNTER — Emergency Department (HOSPITAL_COMMUNITY): Admission: EM | Admit: 2008-05-08 | Discharge: 2008-05-08 | Payer: Self-pay | Admitting: Emergency Medicine

## 2008-05-21 ENCOUNTER — Emergency Department (HOSPITAL_COMMUNITY): Admission: EM | Admit: 2008-05-21 | Discharge: 2008-05-21 | Payer: Self-pay | Admitting: Emergency Medicine

## 2008-06-29 ENCOUNTER — Encounter: Admission: RE | Admit: 2008-06-29 | Discharge: 2008-06-29 | Payer: Self-pay | Admitting: Specialist

## 2008-07-05 ENCOUNTER — Encounter: Admission: RE | Admit: 2008-07-05 | Discharge: 2008-07-05 | Payer: Self-pay | Admitting: Specialist

## 2008-07-10 ENCOUNTER — Emergency Department (HOSPITAL_COMMUNITY): Admission: EM | Admit: 2008-07-10 | Discharge: 2008-07-10 | Payer: Self-pay | Admitting: Emergency Medicine

## 2008-07-19 ENCOUNTER — Ambulatory Visit (HOSPITAL_COMMUNITY): Admission: RE | Admit: 2008-07-19 | Discharge: 2008-07-19 | Payer: Self-pay | Admitting: Specialist

## 2008-08-06 ENCOUNTER — Emergency Department (HOSPITAL_COMMUNITY): Admission: EM | Admit: 2008-08-06 | Discharge: 2008-08-06 | Payer: Self-pay | Admitting: Emergency Medicine

## 2008-08-10 ENCOUNTER — Inpatient Hospital Stay (HOSPITAL_COMMUNITY): Admission: RE | Admit: 2008-08-10 | Discharge: 2008-08-14 | Payer: Self-pay | Admitting: Specialist

## 2008-08-18 ENCOUNTER — Emergency Department (HOSPITAL_COMMUNITY): Admission: EM | Admit: 2008-08-18 | Discharge: 2008-08-18 | Payer: Self-pay | Admitting: Emergency Medicine

## 2008-08-21 ENCOUNTER — Emergency Department (HOSPITAL_COMMUNITY): Admission: EM | Admit: 2008-08-21 | Discharge: 2008-08-21 | Payer: Self-pay | Admitting: Emergency Medicine

## 2008-09-15 ENCOUNTER — Ambulatory Visit: Payer: Self-pay | Admitting: Gastroenterology

## 2008-10-02 ENCOUNTER — Inpatient Hospital Stay (HOSPITAL_COMMUNITY): Admission: EM | Admit: 2008-10-02 | Discharge: 2008-10-06 | Payer: Self-pay | Admitting: Emergency Medicine

## 2008-10-03 ENCOUNTER — Encounter (INDEPENDENT_AMBULATORY_CARE_PROVIDER_SITE_OTHER): Payer: Self-pay | Admitting: *Deleted

## 2008-10-04 ENCOUNTER — Ambulatory Visit: Payer: Self-pay | Admitting: Gastroenterology

## 2008-10-05 ENCOUNTER — Encounter (INDEPENDENT_AMBULATORY_CARE_PROVIDER_SITE_OTHER): Payer: Self-pay | Admitting: *Deleted

## 2008-10-12 ENCOUNTER — Telehealth: Payer: Self-pay | Admitting: Gastroenterology

## 2008-10-12 ENCOUNTER — Emergency Department (HOSPITAL_COMMUNITY): Admission: EM | Admit: 2008-10-12 | Discharge: 2008-10-12 | Payer: Self-pay | Admitting: Emergency Medicine

## 2008-10-21 ENCOUNTER — Ambulatory Visit (HOSPITAL_BASED_OUTPATIENT_CLINIC_OR_DEPARTMENT_OTHER): Admission: RE | Admit: 2008-10-21 | Discharge: 2008-10-21 | Payer: Self-pay | Admitting: Orthopedic Surgery

## 2009-01-02 ENCOUNTER — Emergency Department (HOSPITAL_COMMUNITY): Admission: EM | Admit: 2009-01-02 | Discharge: 2009-01-02 | Payer: Self-pay | Admitting: Emergency Medicine

## 2009-02-25 ENCOUNTER — Emergency Department (HOSPITAL_COMMUNITY): Admission: EM | Admit: 2009-02-25 | Discharge: 2009-02-25 | Payer: Self-pay | Admitting: Emergency Medicine

## 2009-03-03 ENCOUNTER — Emergency Department (HOSPITAL_COMMUNITY): Admission: EM | Admit: 2009-03-03 | Discharge: 2009-03-03 | Payer: Self-pay | Admitting: Emergency Medicine

## 2009-03-04 ENCOUNTER — Emergency Department (HOSPITAL_COMMUNITY): Admission: EM | Admit: 2009-03-04 | Discharge: 2009-03-04 | Payer: Self-pay | Admitting: Emergency Medicine

## 2009-03-30 ENCOUNTER — Emergency Department (HOSPITAL_COMMUNITY): Admission: EM | Admit: 2009-03-30 | Discharge: 2009-03-30 | Payer: Self-pay | Admitting: Emergency Medicine

## 2009-04-08 ENCOUNTER — Encounter: Admission: RE | Admit: 2009-04-08 | Discharge: 2009-04-08 | Payer: Self-pay | Admitting: Specialist

## 2009-04-29 ENCOUNTER — Emergency Department (HOSPITAL_COMMUNITY): Admission: EM | Admit: 2009-04-29 | Discharge: 2009-04-29 | Payer: Self-pay | Admitting: Emergency Medicine

## 2009-05-30 ENCOUNTER — Emergency Department (HOSPITAL_COMMUNITY): Admission: EM | Admit: 2009-05-30 | Discharge: 2009-05-30 | Payer: Self-pay | Admitting: Emergency Medicine

## 2009-06-02 DIAGNOSIS — Z9889 Other specified postprocedural states: Secondary | ICD-10-CM

## 2009-06-02 HISTORY — DX: Other specified postprocedural states: Z98.890

## 2009-06-20 ENCOUNTER — Emergency Department (HOSPITAL_COMMUNITY): Admission: EM | Admit: 2009-06-20 | Discharge: 2009-06-20 | Payer: Self-pay | Admitting: Emergency Medicine

## 2009-07-21 ENCOUNTER — Emergency Department (HOSPITAL_COMMUNITY): Admission: EM | Admit: 2009-07-21 | Discharge: 2009-07-21 | Payer: Self-pay | Admitting: Emergency Medicine

## 2009-07-27 ENCOUNTER — Ambulatory Visit (HOSPITAL_BASED_OUTPATIENT_CLINIC_OR_DEPARTMENT_OTHER): Admission: RE | Admit: 2009-07-27 | Discharge: 2009-07-27 | Payer: Self-pay | Admitting: Orthopedic Surgery

## 2009-08-01 ENCOUNTER — Emergency Department (HOSPITAL_COMMUNITY): Admission: EM | Admit: 2009-08-01 | Discharge: 2009-08-01 | Payer: Self-pay | Admitting: Emergency Medicine

## 2009-09-02 ENCOUNTER — Emergency Department (HOSPITAL_COMMUNITY): Admission: EM | Admit: 2009-09-02 | Discharge: 2009-09-02 | Payer: Self-pay | Admitting: Emergency Medicine

## 2009-09-14 ENCOUNTER — Emergency Department (HOSPITAL_COMMUNITY): Admission: EM | Admit: 2009-09-14 | Discharge: 2009-09-14 | Payer: Self-pay | Admitting: Emergency Medicine

## 2009-09-16 ENCOUNTER — Emergency Department (HOSPITAL_COMMUNITY): Admission: EM | Admit: 2009-09-16 | Discharge: 2009-09-16 | Payer: Self-pay | Admitting: Emergency Medicine

## 2009-09-16 ENCOUNTER — Telehealth (INDEPENDENT_AMBULATORY_CARE_PROVIDER_SITE_OTHER): Payer: Self-pay

## 2009-09-18 ENCOUNTER — Emergency Department (HOSPITAL_COMMUNITY): Admission: EM | Admit: 2009-09-18 | Discharge: 2009-09-18 | Payer: Self-pay | Admitting: Emergency Medicine

## 2009-09-21 ENCOUNTER — Telehealth: Payer: Self-pay | Admitting: Gastroenterology

## 2009-09-23 ENCOUNTER — Emergency Department (HOSPITAL_COMMUNITY): Admission: EM | Admit: 2009-09-23 | Discharge: 2009-09-23 | Payer: Self-pay | Admitting: Emergency Medicine

## 2009-09-26 ENCOUNTER — Emergency Department (HOSPITAL_COMMUNITY): Admission: EM | Admit: 2009-09-26 | Discharge: 2009-09-26 | Payer: Self-pay | Admitting: Emergency Medicine

## 2009-10-14 ENCOUNTER — Emergency Department (HOSPITAL_COMMUNITY): Admission: EM | Admit: 2009-10-14 | Discharge: 2009-10-15 | Payer: Self-pay | Admitting: Emergency Medicine

## 2009-10-19 ENCOUNTER — Emergency Department (HOSPITAL_COMMUNITY): Admission: EM | Admit: 2009-10-19 | Discharge: 2009-10-19 | Payer: Self-pay | Admitting: Emergency Medicine

## 2009-10-21 ENCOUNTER — Encounter: Payer: Self-pay | Admitting: Gastroenterology

## 2009-10-21 ENCOUNTER — Telehealth (INDEPENDENT_AMBULATORY_CARE_PROVIDER_SITE_OTHER): Payer: Self-pay

## 2009-10-21 ENCOUNTER — Ambulatory Visit (HOSPITAL_COMMUNITY): Admission: RE | Admit: 2009-10-21 | Discharge: 2009-10-21 | Payer: Self-pay | Admitting: Gastroenterology

## 2009-10-21 DIAGNOSIS — R1110 Vomiting, unspecified: Secondary | ICD-10-CM | POA: Insufficient documentation

## 2009-10-21 DIAGNOSIS — R109 Unspecified abdominal pain: Secondary | ICD-10-CM | POA: Insufficient documentation

## 2009-10-21 LAB — CONVERTED CEMR LAB
ALT: 52 units/L — ABNORMAL HIGH (ref 0–35)
CO2: 28 meq/L (ref 19–32)
Calcium: 9 mg/dL (ref 8.4–10.5)
Chloride: 107 meq/L (ref 96–112)
Creatinine, Ser: 0.93 mg/dL (ref 0.40–1.20)
Eosinophils Absolute: 0.5 10*3/uL (ref 0.0–0.7)
HCT: 34.1 % — ABNORMAL LOW (ref 36.0–46.0)
Hemoglobin: 11.3 g/dL — ABNORMAL LOW (ref 12.0–15.0)
Leukocytes, UA: NEGATIVE
Lymphs Abs: 1.8 10*3/uL (ref 0.7–4.0)
MCV: 93.5 fL (ref 78.0–100.0)
Monocytes Absolute: 0.8 10*3/uL (ref 0.1–1.0)
Monocytes Relative: 10 % (ref 3–12)
Neutrophils Relative %: 58 % (ref 43–77)
Nitrite: NEGATIVE
Protein, ur: NEGATIVE mg/dL
RBC: 3.65 M/uL — ABNORMAL LOW (ref 3.87–5.11)
Total Protein: 5.8 g/dL — ABNORMAL LOW (ref 6.0–8.3)
WBC: 7.5 10*3/uL (ref 4.0–10.5)

## 2009-10-25 ENCOUNTER — Emergency Department (HOSPITAL_COMMUNITY): Admission: EM | Admit: 2009-10-25 | Discharge: 2009-10-25 | Payer: Self-pay | Admitting: Emergency Medicine

## 2009-10-25 ENCOUNTER — Encounter: Payer: Self-pay | Admitting: Internal Medicine

## 2009-10-25 ENCOUNTER — Encounter: Payer: Self-pay | Admitting: Gastroenterology

## 2009-12-16 ENCOUNTER — Emergency Department (HOSPITAL_COMMUNITY): Admission: EM | Admit: 2009-12-16 | Discharge: 2009-12-16 | Payer: Self-pay | Admitting: Emergency Medicine

## 2010-03-19 ENCOUNTER — Emergency Department (HOSPITAL_COMMUNITY): Admission: EM | Admit: 2010-03-19 | Discharge: 2010-03-19 | Payer: Self-pay | Admitting: Emergency Medicine

## 2010-05-03 ENCOUNTER — Emergency Department (HOSPITAL_COMMUNITY): Admission: EM | Admit: 2010-05-03 | Discharge: 2010-05-03 | Payer: Self-pay | Admitting: Emergency Medicine

## 2010-08-11 ENCOUNTER — Emergency Department (HOSPITAL_COMMUNITY): Admission: EM | Admit: 2010-08-11 | Discharge: 2010-08-11 | Payer: Self-pay | Admitting: Emergency Medicine

## 2010-08-20 ENCOUNTER — Emergency Department (HOSPITAL_COMMUNITY): Admission: EM | Admit: 2010-08-20 | Discharge: 2010-08-21 | Payer: Self-pay | Admitting: Emergency Medicine

## 2010-09-05 ENCOUNTER — Emergency Department (HOSPITAL_COMMUNITY): Admission: EM | Admit: 2010-09-05 | Discharge: 2010-09-05 | Payer: Self-pay | Admitting: Emergency Medicine

## 2010-09-14 ENCOUNTER — Emergency Department (HOSPITAL_COMMUNITY): Admission: EM | Admit: 2010-09-14 | Discharge: 2009-10-17 | Payer: Self-pay | Admitting: Emergency Medicine

## 2010-09-15 ENCOUNTER — Emergency Department (HOSPITAL_COMMUNITY)
Admission: EM | Admit: 2010-09-15 | Discharge: 2010-09-15 | Payer: Self-pay | Source: Home / Self Care | Admitting: Emergency Medicine

## 2010-09-19 ENCOUNTER — Emergency Department (HOSPITAL_COMMUNITY)
Admission: EM | Admit: 2010-09-19 | Discharge: 2010-09-19 | Payer: Self-pay | Source: Home / Self Care | Admitting: Emergency Medicine

## 2010-09-20 ENCOUNTER — Emergency Department (HOSPITAL_COMMUNITY)
Admission: EM | Admit: 2010-09-20 | Discharge: 2010-09-20 | Payer: Self-pay | Source: Home / Self Care | Admitting: Emergency Medicine

## 2010-10-03 ENCOUNTER — Emergency Department (HOSPITAL_COMMUNITY)
Admission: EM | Admit: 2010-10-03 | Discharge: 2010-10-03 | Payer: Self-pay | Source: Home / Self Care | Admitting: Emergency Medicine

## 2010-10-07 ENCOUNTER — Emergency Department (HOSPITAL_COMMUNITY)
Admission: EM | Admit: 2010-10-07 | Discharge: 2010-10-07 | Payer: Self-pay | Source: Home / Self Care | Admitting: Emergency Medicine

## 2010-10-18 ENCOUNTER — Emergency Department (HOSPITAL_COMMUNITY)
Admission: EM | Admit: 2010-10-18 | Discharge: 2010-10-18 | Payer: Self-pay | Source: Home / Self Care | Admitting: Emergency Medicine

## 2010-10-22 ENCOUNTER — Emergency Department (HOSPITAL_COMMUNITY)
Admission: EM | Admit: 2010-10-22 | Discharge: 2010-10-22 | Payer: Self-pay | Source: Home / Self Care | Admitting: Emergency Medicine

## 2010-10-23 LAB — CBC
HCT: 40.1 % (ref 36.0–46.0)
Hemoglobin: 13.8 g/dL (ref 12.0–15.0)
MCH: 29.9 pg (ref 26.0–34.0)
MCHC: 34.4 g/dL (ref 30.0–36.0)
MCV: 87 fL (ref 78.0–100.0)
Platelets: 369 10*3/uL (ref 150–400)
RBC: 4.61 MIL/uL (ref 3.87–5.11)
RDW: 14.9 % (ref 11.5–15.5)
WBC: 11 10*3/uL — ABNORMAL HIGH (ref 4.0–10.5)

## 2010-10-23 LAB — DIFFERENTIAL
Basophils Absolute: 0 10*3/uL (ref 0.0–0.1)
Basophils Relative: 1 % (ref 0–1)
Eosinophils Absolute: 1 10*3/uL — ABNORMAL HIGH (ref 0.0–0.7)
Eosinophils Relative: 6 % — ABNORMAL HIGH (ref 0–5)
Lymphocytes Relative: 36 % (ref 12–46)
Lymphs Abs: 3.9 10*3/uL (ref 0.7–4.0)
Monocytes Absolute: 1 10*3/uL (ref 0.1–1.0)
Monocytes Relative: 7 % (ref 3–12)
Neutro Abs: 5.5 10*3/uL (ref 1.7–7.7)
Neutrophils Relative %: 50 % (ref 43–77)

## 2010-10-23 LAB — URINALYSIS, ROUTINE W REFLEX MICROSCOPIC
Bilirubin Urine: NEGATIVE
Hgb urine dipstick: NEGATIVE
Ketones, ur: NEGATIVE mg/dL
Nitrite: NEGATIVE
Protein, ur: NEGATIVE mg/dL
Specific Gravity, Urine: >1.03 — ABNORMAL HIGH (ref 1.005–1.030)
Urine Glucose, Fasting: NEGATIVE mg/dL
Urobilinogen, UA: 0.2 mg/dL (ref 0.0–1.0)
pH: 5.5 (ref 5.0–8.0)

## 2010-10-23 LAB — BASIC METABOLIC PANEL
BUN: 15 mg/dL (ref 6–23)
CO2: 25 mEq/L (ref 19–32)
Calcium: 9.6 mg/dL (ref 8.4–10.5)
Chloride: 108 mEq/L (ref 96–112)
Creatinine, Ser: 0.77 mg/dL (ref 0.4–1.2)
GFR calc Af Amer: >60 mL/min (ref >60)
GFR calc non Af Amer: >60 mL/min (ref >60)
Glucose, Bld: 104 mg/dL — ABNORMAL HIGH (ref 70–99)
Potassium: 3.2 mEq/L — ABNORMAL LOW (ref 3.5–5.1)
Sodium: 143 mEq/L (ref 135–145)

## 2010-10-23 LAB — LIPASE, BLOOD: Lipase: 33 U/L (ref 11–59)

## 2010-10-23 LAB — HEPATIC FUNCTION PANEL
ALT: 20 U/L (ref 0–35)
AST: 22 U/L (ref 0–37)
Albumin: 4.2 g/dL (ref 3.5–5.2)
Alkaline Phosphatase: 101 U/L (ref 39–117)
Bilirubin, Direct: <0.1 mg/dL (ref 0.0–0.3)
Total Bilirubin: 0.2 mg/dL — ABNORMAL LOW (ref 0.3–1.2)
Total Protein: 6.8 g/dL (ref 6.0–8.3)

## 2010-10-29 ENCOUNTER — Encounter: Payer: Self-pay | Admitting: Family Medicine

## 2010-10-29 ENCOUNTER — Encounter: Payer: Self-pay | Admitting: Internal Medicine

## 2010-10-30 ENCOUNTER — Encounter: Payer: Self-pay | Admitting: Specialist

## 2010-11-09 NOTE — Letter (Signed)
Summary: CT SCAN ORDER  CT SCAN ORDER   Imported By: Ave Filter 10/25/2009 12:56:04  _____________________________________________________________________  External Attachment:    Type:   Image     Comment:   External Document

## 2010-11-09 NOTE — Progress Notes (Signed)
  Phone Note Call from Patient   Caller: Patient Summary of Call: Pt called and said that she is having vomiting  and paind for the last 4 days. She has been to the ER x 2. She cannot get food to stay down.  She can tolerate liquids. Having alot of pain in diaphragm. She has had blockages and she is very worried. Woud like a call from Dr. Darrick Penna, or advice on what to do. Initial call taken by: Cloria Spring LPN,  October 21, 2009 11:25 AM     Appended Document:  Please call pt. She needs a CT ABD/PELVIS w/ oral contrast STAT: RE: abd pain, vomiting, PSHX: lysis of adhesions. STAT CMP/CBC/UA-MICRO TODAY.  Appended Document:  Initial consult/EGD performed by Dr. Jena Gauss. Pt seen by me in scheduling error in 2009. Pt needs to have subsequent care by [facility].  Appended Document:  Called pt and she requests to follow-up with Dr. Darrick Penna.  Appended Document:  I spoke with pt and instructed her to go to [facility] for her CT Scan.  Order faxed to Radiology.

## 2010-11-09 NOTE — Letter (Signed)
Summary: UGI SERIES ORDER  UGI SERIES ORDER   Imported By: Ave Filter 10/25/2009 12:52:40  _____________________________________________________________________  External Attachment:    Type:   Image     Comment:   External Document

## 2010-11-09 NOTE — Miscellaneous (Signed)
Summary: Orders Update  Clinical Lists Changes  Problems: Added new problem of VOMITING (ICD-787.03) Added new problem of ABDOMINAL PAIN (ICD-789.00) Orders: Added new Test order of T-CBC w/Diff [phone]) - Signed Added new Test order of T-Comprehensive Metabolic Panel [phone]) - Signed Added new Test order of T-Urine Microscopic ([id number]) - Signed  Appended Document: Orders Update Labs ordered STAT.  Appended Document: Orders Update Pt said her cell number if needed is [phone].  Appended Document: Orders Update T/C from [facility], said pt came by for labs. Could not leave specimen. Went to hospital for CT. Spoke with Darel Hong in radiology and she said she it there now, not feeling very well.  She will have her try to go back to [facility] for the Micro-urine when she is better. Order being faxed to [facility].  Appended Document: Orders Update Per Durward Mallard, fax lab order to [phone], at the hospital. Pt was dehydrated.

## 2010-11-09 NOTE — Miscellaneous (Signed)
Summary: Orders Update  Clinical Lists Changes  Orders: Added new Test order of T-Urine Microscopic [phone]) - Signed

## 2010-11-11 ENCOUNTER — Emergency Department (HOSPITAL_COMMUNITY): Payer: MEDICARE

## 2010-11-11 ENCOUNTER — Emergency Department (HOSPITAL_COMMUNITY)
Admission: EM | Admit: 2010-11-11 | Discharge: 2010-11-11 | Disposition: A | Discharge status: Home / Self Care | Payer: MEDICARE | Attending: Emergency Medicine | Admitting: Emergency Medicine

## 2010-11-11 DIAGNOSIS — H53149 Visual discomfort, unspecified: Secondary | ICD-10-CM | POA: Insufficient documentation

## 2010-11-11 DIAGNOSIS — K219 Gastro-esophageal reflux disease without esophagitis: Secondary | ICD-10-CM | POA: Insufficient documentation

## 2010-11-11 DIAGNOSIS — R112 Nausea with vomiting, unspecified: Secondary | ICD-10-CM | POA: Insufficient documentation

## 2010-11-11 DIAGNOSIS — R51 Headache: Secondary | ICD-10-CM | POA: Insufficient documentation

## 2010-11-11 LAB — DIFFERENTIAL
Lymphs Abs: 2.8 10*3/uL (ref 0.7–4.0)
Monocytes Relative: 8 % (ref 3–12)
Neutro Abs: 7.3 10*3/uL (ref 1.7–7.7)
Neutrophils Relative %: 63 % (ref 43–77)

## 2010-11-11 LAB — BASIC METABOLIC PANEL
CO2: 29 mEq/L (ref 19–32)
Calcium: 9.5 mg/dL (ref 8.4–10.5)
Chloride: 106 mEq/L (ref 96–112)
GFR calc Af Amer: >60 mL/min (ref >60)
Sodium: 143 mEq/L (ref 135–145)

## 2010-11-11 LAB — CBC
Hemoglobin: 11.7 g/dL — ABNORMAL LOW (ref 12.0–15.0)
MCH: 28.7 pg (ref 26.0–34.0)
MCV: 89.4 fL (ref 78.0–100.0)
RBC: 4.07 MIL/uL (ref 3.87–5.11)

## 2010-11-11 LAB — PROTIME-INR: Prothrombin Time: 11.6 seconds (ref 11.6–15.2)

## 2010-11-11 IMAGING — CT CT PELVIS W/ CM
2 of 5 series · 17 of 46 positions shown, 19 images · IV contrast (water/omni  & 80 ml omni 300)
Comparison: CT lumbar spine 08/18/2008.  CT abdomen without
contrast 05/08/2008.

CT ABDOMEN

CLINICAL DATA: Abdominal pain and nausea.  Fever.  Status post
recent lumbar surgery.

CT ABDOMEN AND PELVIS WITH CONTRAST
TECHNIQUE: Multidetector CT imaging of the abdomen and pelvis was
performed using the standard protocol following bolus
administration of intravenous contrast.
Contrast: 80 ml Omnipaque 300

[Series 2: routine abdomen · axial · 0.70mm/px · z∈[-469,-84]mm · 14 of 87 slices shown, 16 images]
[im 5/87  soft-tissue]
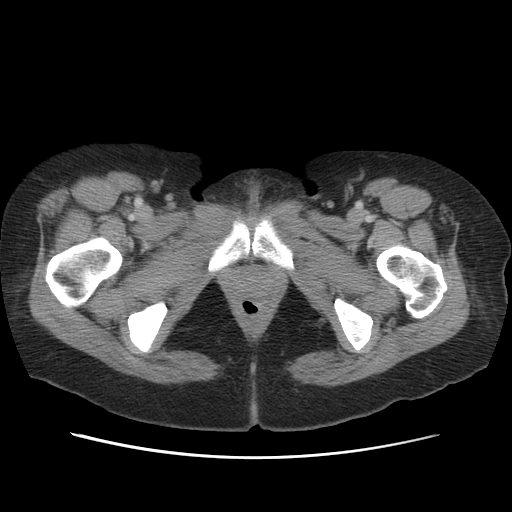
[im 5/87  bone]
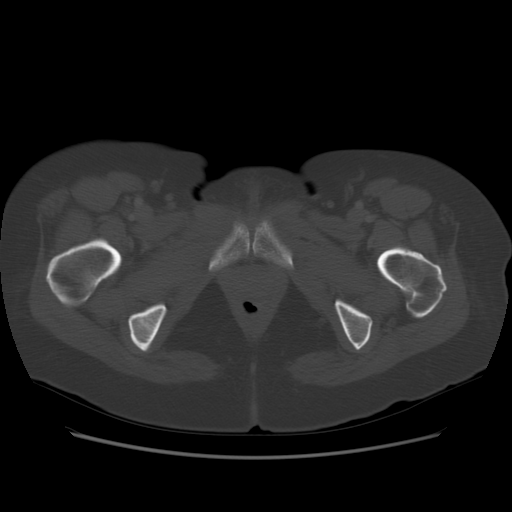
[im 13/87  soft-tissue]
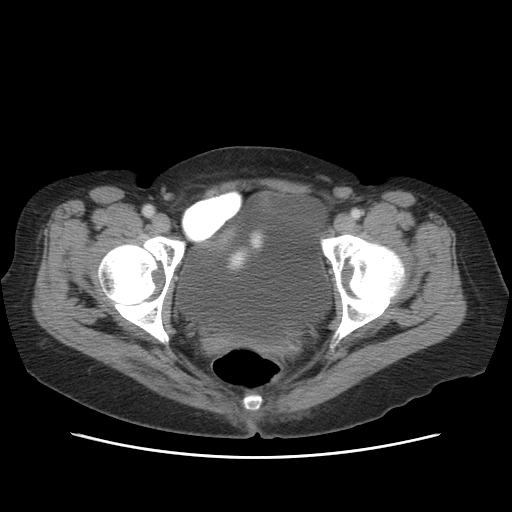
[im 18/87  soft-tissue]
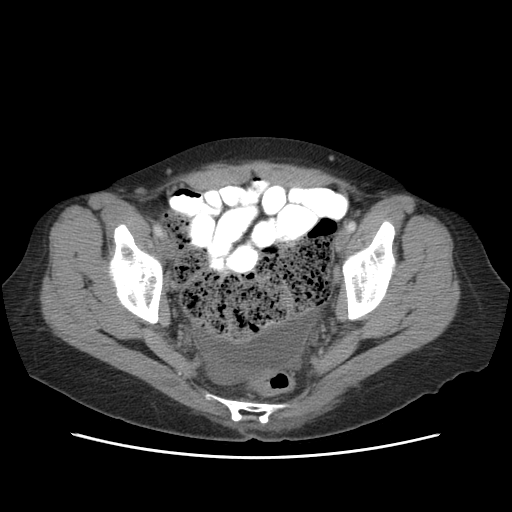
[im 22/87  soft-tissue]
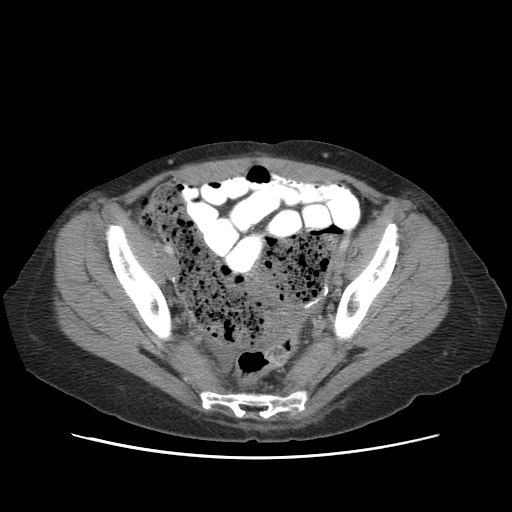
[im 31/87  soft-tissue]
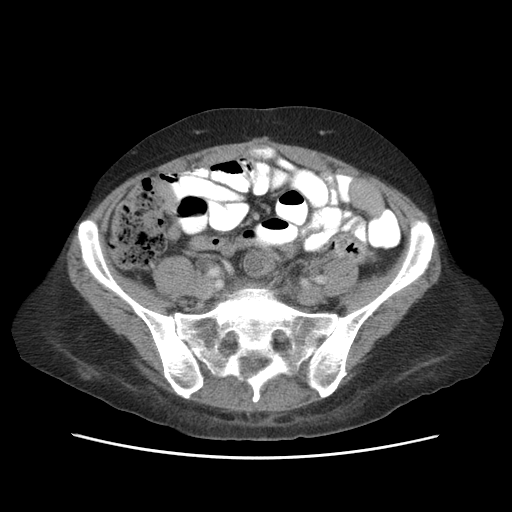
[im 35/87  soft-tissue]
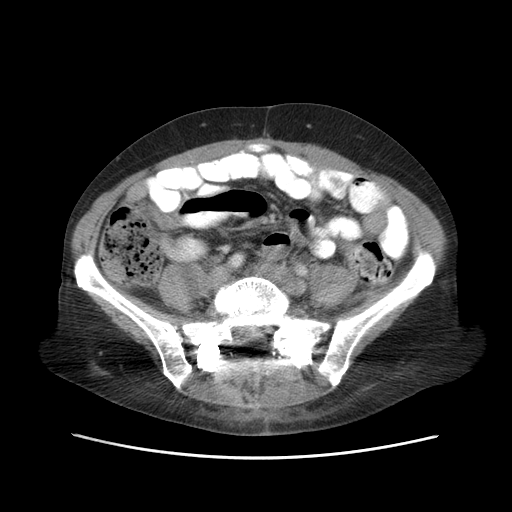
[im 39/87  soft-tissue]
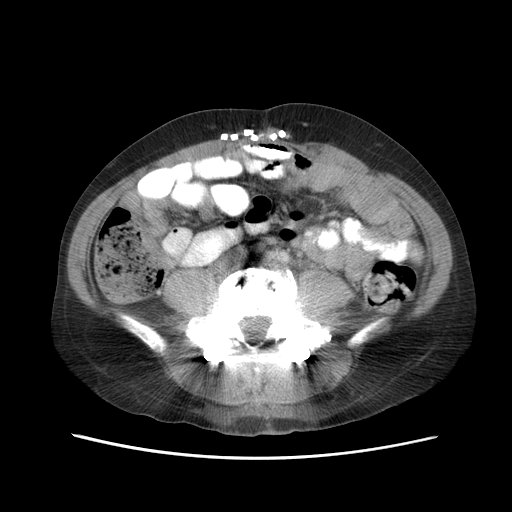
[im 48/87  soft-tissue]
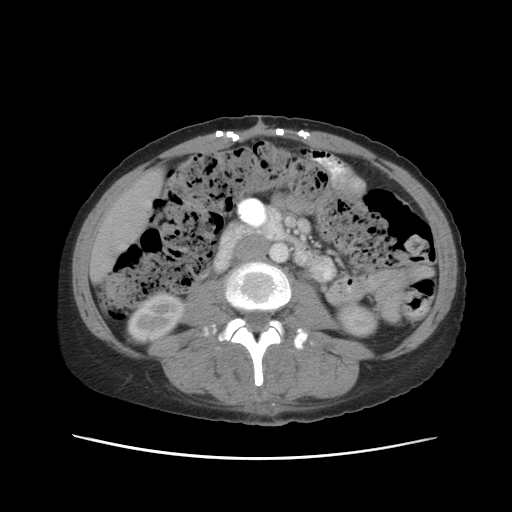
[im 52/87  soft-tissue]
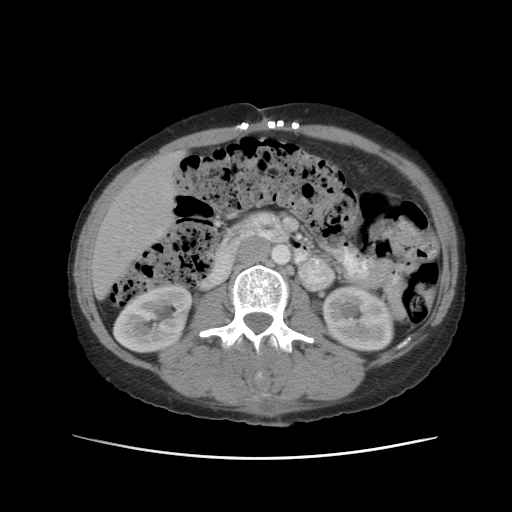
[im 52/87  bone]
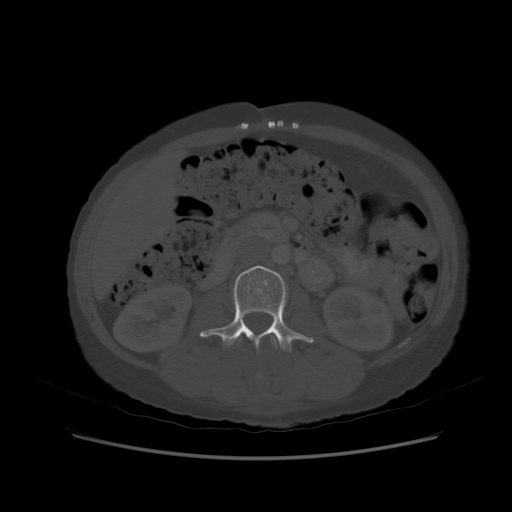
[im 56/87  soft-tissue]
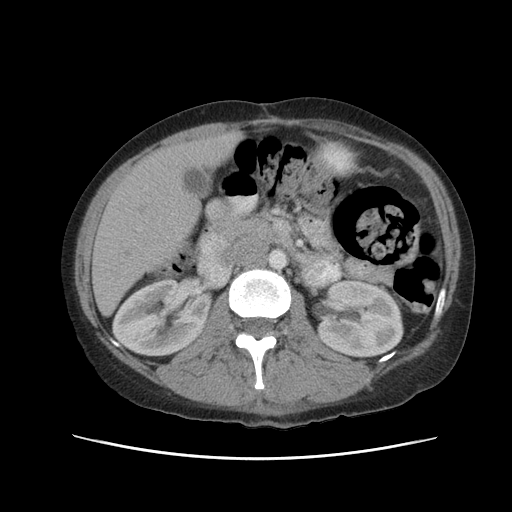
[im 65/87  soft-tissue]
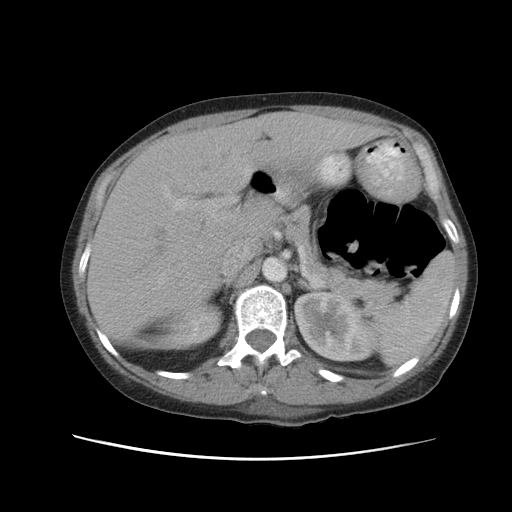
[im 69/87  soft-tissue]
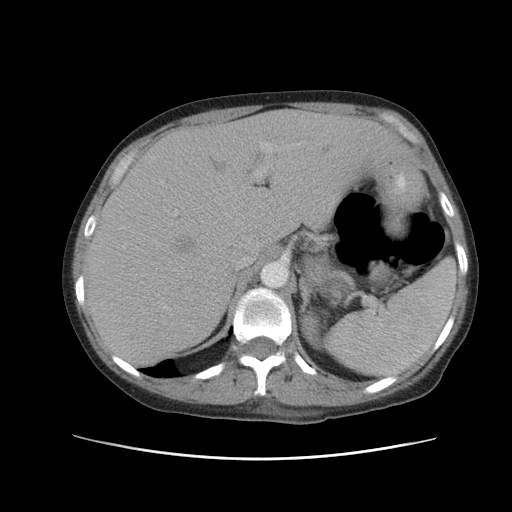
[im 74/87  soft-tissue]
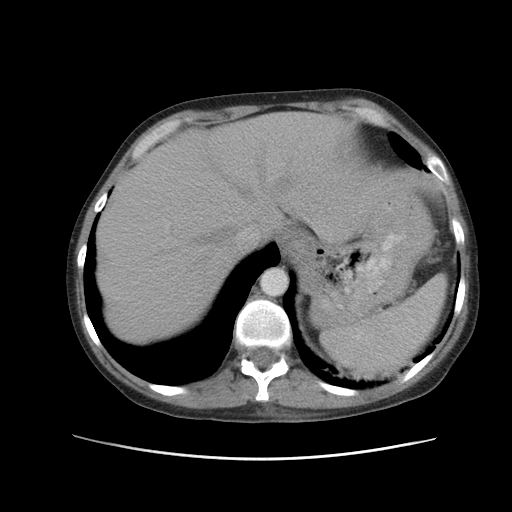
[im 82/87  soft-tissue]
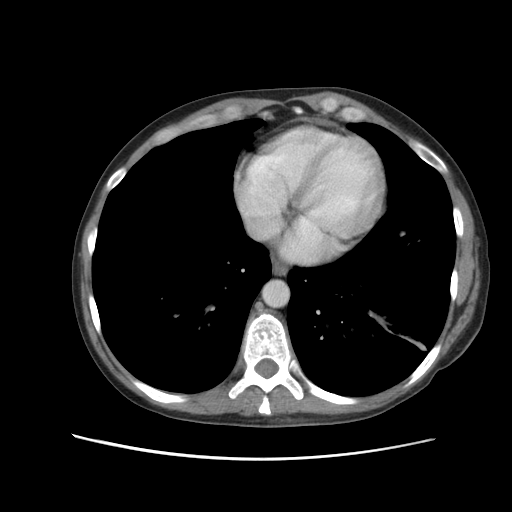

[Series 400: cor a/p · coronal · 0.90mm/px · 3 of 86 slices shown]
[im 29/86  soft-tissue]
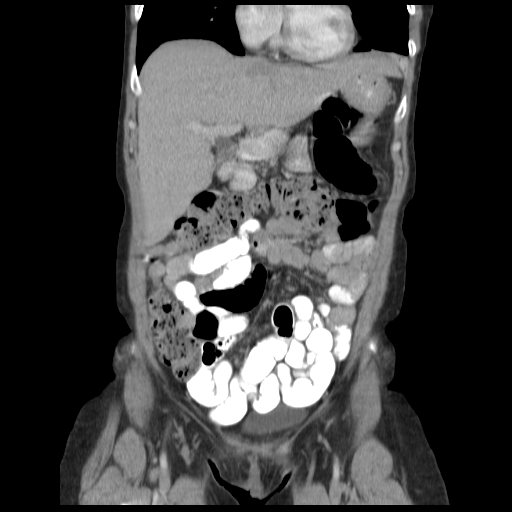
[im 38/86  soft-tissue]
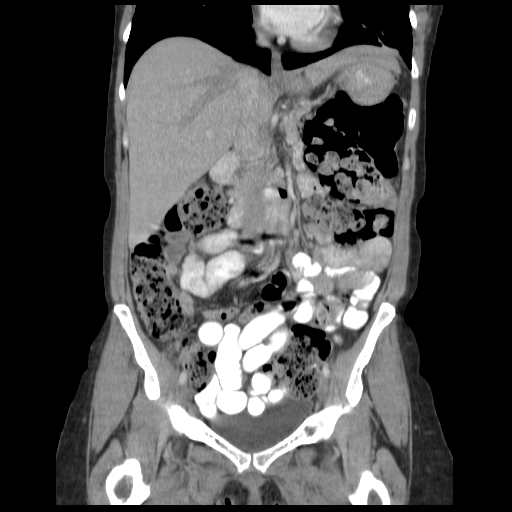
[im 48/86  soft-tissue]
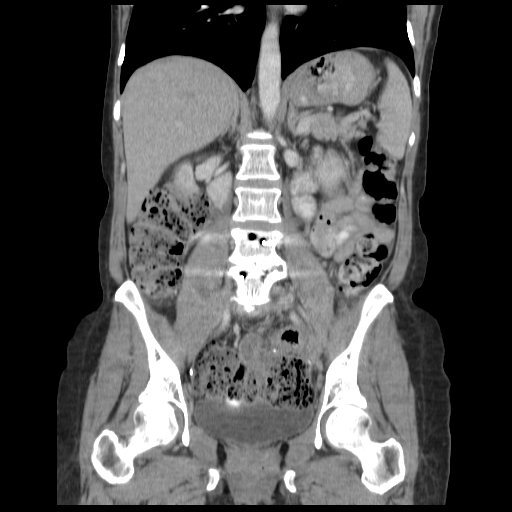

[17 of 46 positions shown; findings below may reference images not displayed]

FINDINGS: Areas of linear atelectasis are present at the left lung
base.  The heart size is normal.  There is no significant pleural
or pericardial effusion.

The infused appearance of the liver and spleen is normal.  The
pancreas, common bile duct and gallbladder are normal.  The adrenal
glands are within normal limits bilaterally.  Kidneys are
unremarkable.  The patient is status post PLIF L4-S1.  There is no
significant interval change in the hardware.  There remains some
fluid gas on the left at the L4-5 level.  Minimal gas is seen on
the right at L5-S1.  These collections are decreased compared the
prior study.
IMPRESSION: 1.  Postoperative changes of ventral hernia repair and L4-S1
fusion.
2.  Large amount stool throughout the colon.
3.  No other acute abnormality.

CT PELVIS
FINDINGS: The rectosigmoid colon is within normal limits.  As
stated above, there is a large amount stool throughout the majority
of the colon.  A small amount of free fluid is present.  Urinary
bladder is mildly distended.  Bone windows are unremarkable.
IMPRESSION: 1.  Small amount of free fluid is likely physiologic.
2.  Large amount of stool throughout the colon.

## 2010-11-14 ENCOUNTER — Emergency Department (HOSPITAL_COMMUNITY)
Admission: EM | Admit: 2010-11-14 | Discharge: 2010-11-14 | Disposition: A | Discharge status: Home / Self Care | Payer: MEDICARE | Attending: Emergency Medicine | Admitting: Emergency Medicine

## 2010-11-14 DIAGNOSIS — R112 Nausea with vomiting, unspecified: Secondary | ICD-10-CM | POA: Insufficient documentation

## 2010-11-14 DIAGNOSIS — G8929 Other chronic pain: Secondary | ICD-10-CM | POA: Insufficient documentation

## 2010-11-14 DIAGNOSIS — J3489 Other specified disorders of nose and nasal sinuses: Secondary | ICD-10-CM | POA: Insufficient documentation

## 2010-11-14 DIAGNOSIS — Z79899 Other long term (current) drug therapy: Secondary | ICD-10-CM | POA: Insufficient documentation

## 2010-11-14 DIAGNOSIS — K219 Gastro-esophageal reflux disease without esophagitis: Secondary | ICD-10-CM | POA: Insufficient documentation

## 2010-11-14 DIAGNOSIS — R51 Headache: Secondary | ICD-10-CM | POA: Insufficient documentation

## 2010-12-07 ENCOUNTER — Emergency Department (HOSPITAL_COMMUNITY)
Admission: EM | Admit: 2010-12-07 | Discharge: 2010-12-07 | Disposition: A | Discharge status: Home / Self Care | Payer: MEDICARE | Attending: Emergency Medicine | Admitting: Emergency Medicine

## 2010-12-07 DIAGNOSIS — Z79899 Other long term (current) drug therapy: Secondary | ICD-10-CM | POA: Insufficient documentation

## 2010-12-07 DIAGNOSIS — G43909 Migraine, unspecified, not intractable, without status migrainosus: Secondary | ICD-10-CM | POA: Insufficient documentation

## 2010-12-19 LAB — COMPREHENSIVE METABOLIC PANEL
ALT: 18 U/L (ref 0–35)
AST: 29 U/L (ref 0–37)
Alkaline Phosphatase: 86 U/L (ref 39–117)
CO2: 25 mEq/L (ref 19–32)
Chloride: 108 mEq/L (ref 96–112)
Creatinine, Ser: 0.62 mg/dL (ref 0.4–1.2)
GFR calc Af Amer: >60 mL/min (ref >60)
GFR calc non Af Amer: >60 mL/min (ref >60)
Potassium: 3.4 mEq/L — ABNORMAL LOW (ref 3.5–5.1)
Total Bilirubin: 0.5 mg/dL (ref 0.3–1.2)

## 2010-12-19 LAB — DIFFERENTIAL
Basophils Relative: 0 % (ref 0–1)
Basophils Relative: 1 % (ref 0–1)
Eosinophils Absolute: 0.3 10*3/uL (ref 0.0–0.7)
Eosinophils Absolute: 0.6 10*3/uL (ref 0.0–0.7)
Monocytes Absolute: 0.7 10*3/uL (ref 0.1–1.0)
Monocytes Relative: 10 % (ref 3–12)
Monocytes Relative: 10 % (ref 3–12)
Neutrophils Relative %: 60 % (ref 43–77)

## 2010-12-19 LAB — CBC
HCT: 40.2 % (ref 36.0–46.0)
Hemoglobin: 10.5 g/dL — ABNORMAL LOW (ref 12.0–15.0)
Hemoglobin: 13.6 g/dL (ref 12.0–15.0)
MCH: 29.4 pg (ref 26.0–34.0)
MCH: 29.7 pg (ref 26.0–34.0)
MCHC: 33.8 g/dL (ref 30.0–36.0)
MCV: 86.8 fL (ref 78.0–100.0)
Platelets: 329 10*3/uL (ref 150–400)
RBC: 3.55 MIL/uL — ABNORMAL LOW (ref 3.87–5.11)
RBC: 4.63 MIL/uL (ref 3.87–5.11)
RDW: 13.7 % (ref 11.5–15.5)
WBC: 7.4 10*3/uL (ref 4.0–10.5)

## 2010-12-19 LAB — URINALYSIS, ROUTINE W REFLEX MICROSCOPIC
Bilirubin Urine: NEGATIVE
Glucose, UA: NEGATIVE mg/dL
Ketones, ur: 15 mg/dL — AB
Leukocytes, UA: NEGATIVE
Nitrite: NEGATIVE
Protein, ur: NEGATIVE mg/dL
Specific Gravity, Urine: 1.015 (ref 1.005–1.030)
Urobilinogen, UA: 0.2 mg/dL (ref 0.0–1.0)
pH: 6 (ref 5.0–8.0)

## 2010-12-19 LAB — BASIC METABOLIC PANEL
CO2: 25 mEq/L (ref 19–32)
Glucose, Bld: 95 mg/dL (ref 70–99)
Potassium: 3.9 mEq/L (ref 3.5–5.1)
Sodium: 136 mEq/L (ref 135–145)

## 2010-12-19 LAB — LIPASE, BLOOD: Lipase: 23 U/L (ref 11–59)

## 2010-12-23 LAB — DIFFERENTIAL
Basophils Absolute: 0 10*3/uL (ref 0.0–0.1)
Eosinophils Absolute: 0.6 10*3/uL (ref 0.0–0.7)
Eosinophils Relative: 5 % (ref 0–5)
Eosinophils Relative: 7 % — ABNORMAL HIGH (ref 0–5)
Lymphocytes Relative: 25 % (ref 12–46)
Lymphocytes Relative: 25 % (ref 12–46)
Lymphs Abs: 2.1 10*3/uL (ref 0.7–4.0)
Lymphs Abs: 2.1 10*3/uL (ref 0.7–4.0)
Monocytes Absolute: 0.7 10*3/uL (ref 0.1–1.0)
Monocytes Relative: 9 % (ref 3–12)
Monocytes Relative: 8 % (ref 3–12)
Neutrophils Relative %: 60 % (ref 43–77)

## 2010-12-23 LAB — COMPREHENSIVE METABOLIC PANEL
ALT: 34 U/L (ref 0–35)
AST: 40 U/L — ABNORMAL HIGH (ref 0–37)
Calcium: 8.5 mg/dL (ref 8.4–10.5)
Creatinine, Ser: 0.72 mg/dL (ref 0.4–1.2)
GFR calc Af Amer: >60 mL/min (ref >60)
Sodium: 140 mEq/L (ref 135–145)
Total Protein: 6.2 g/dL (ref 6.0–8.3)

## 2010-12-23 LAB — URINALYSIS, ROUTINE W REFLEX MICROSCOPIC
Bilirubin Urine: NEGATIVE
Protein, ur: NEGATIVE mg/dL
Specific Gravity, Urine: 1.02 (ref 1.005–1.030)
Urobilinogen, UA: 0.2 mg/dL (ref 0.0–1.0)

## 2010-12-23 LAB — HEPATIC FUNCTION PANEL
ALT: 32 U/L (ref 0–35)
Alkaline Phosphatase: 111 U/L (ref 39–117)
Bilirubin, Direct: <0.1 mg/dL (ref 0.0–0.3)
Total Bilirubin: 0.7 mg/dL (ref 0.3–1.2)

## 2010-12-23 LAB — LIPASE, BLOOD: Lipase: 26 U/L (ref 11–59)

## 2010-12-23 LAB — BASIC METABOLIC PANEL
BUN: 18 mg/dL (ref 6–23)
Chloride: 103 mEq/L (ref 96–112)
Potassium: 4.5 mEq/L (ref 3.5–5.1)

## 2010-12-23 LAB — CBC
HCT: 43.8 % (ref 36.0–46.0)
Hemoglobin: 14.8 g/dL (ref 12.0–15.0)
MCHC: 34 g/dL (ref 30.0–36.0)
MCV: 89.3 fL (ref 78.0–100.0)
MCV: 93 fL (ref 78.0–100.0)
RDW: 13.5 % (ref 11.5–15.5)
RDW: 14.6 % (ref 11.5–15.5)
WBC: 8.4 10*3/uL (ref 4.0–10.5)

## 2010-12-23 LAB — URINE MICROSCOPIC-ADD ON

## 2010-12-24 LAB — COMPREHENSIVE METABOLIC PANEL
ALT: 48 U/L — ABNORMAL HIGH (ref 0–35)
ALT: 51 U/L — ABNORMAL HIGH (ref 0–35)
AST: 39 U/L — ABNORMAL HIGH (ref 0–37)
Alkaline Phosphatase: 118 U/L — ABNORMAL HIGH (ref 39–117)
Alkaline Phosphatase: 124 U/L — ABNORMAL HIGH (ref 39–117)
BUN: 17 mg/dL (ref 6–23)
CO2: 27 mEq/L (ref 19–32)
CO2: 32 mEq/L (ref 19–32)
Calcium: 9.2 mg/dL (ref 8.4–10.5)
GFR calc Af Amer: >60 mL/min (ref >60)
GFR calc non Af Amer: >60 mL/min (ref >60)
GFR calc non Af Amer: >60 mL/min (ref >60)
Glucose, Bld: 68 mg/dL — ABNORMAL LOW (ref 70–99)
Glucose, Bld: 89 mg/dL (ref 70–99)
Potassium: 3.8 mEq/L (ref 3.5–5.1)
Potassium: 3.9 mEq/L (ref 3.5–5.1)
Sodium: 136 mEq/L (ref 135–145)
Sodium: 138 mEq/L (ref 135–145)
Total Bilirubin: 0.4 mg/dL (ref 0.3–1.2)
Total Protein: 7.1 g/dL (ref 6.0–8.3)

## 2010-12-24 LAB — BASIC METABOLIC PANEL
CO2: 26 mEq/L (ref 19–32)
Calcium: 9.6 mg/dL (ref 8.4–10.5)
Creatinine, Ser: 0.69 mg/dL (ref 0.4–1.2)
GFR calc Af Amer: >60 mL/min (ref >60)
GFR calc non Af Amer: >60 mL/min (ref >60)
Glucose, Bld: 89 mg/dL (ref 70–99)

## 2010-12-24 LAB — DIFFERENTIAL
Basophils Absolute: 0.1 10*3/uL (ref 0.0–0.1)
Basophils Absolute: 0.1 10*3/uL (ref 0.0–0.1)
Basophils Relative: 1 % (ref 0–1)
Basophils Relative: 1 % (ref 0–1)
Basophils Relative: 1 % (ref 0–1)
Eosinophils Absolute: 0.7 10*3/uL (ref 0.0–0.7)
Eosinophils Absolute: 0.7 10*3/uL (ref 0.0–0.7)
Eosinophils Relative: 8 % — ABNORMAL HIGH (ref 0–5)
Lymphs Abs: 2.1 10*3/uL (ref 0.7–4.0)
Monocytes Relative: 10 % (ref 3–12)
Neutro Abs: 12.3 10*3/uL — ABNORMAL HIGH (ref 1.7–7.7)
Neutro Abs: 4 10*3/uL (ref 1.7–7.7)
Neutrophils Relative %: 82 % — ABNORMAL HIGH (ref 43–77)
Neutrophils Relative %: 60 % (ref 43–77)

## 2010-12-24 LAB — CBC
HCT: 36.8 % (ref 36.0–46.0)
HCT: 37.9 % (ref 36.0–46.0)
Hemoglobin: 12.7 g/dL (ref 12.0–15.0)
MCHC: 33.9 g/dL (ref 30.0–36.0)
Platelets: 362 10*3/uL (ref 150–400)
RBC: 4.09 MIL/uL (ref 3.87–5.11)
RDW: 14.4 % (ref 11.5–15.5)
RDW: 14.5 % (ref 11.5–15.5)
RDW: 14.2 % (ref 11.5–15.5)
WBC: 8.7 10*3/uL (ref 4.0–10.5)

## 2010-12-24 LAB — URINALYSIS, ROUTINE W REFLEX MICROSCOPIC
Glucose, UA: NEGATIVE mg/dL
Ketones, ur: NEGATIVE mg/dL
Nitrite: NEGATIVE
Protein, ur: NEGATIVE mg/dL
Urobilinogen, UA: 0.2 mg/dL (ref 0.0–1.0)

## 2010-12-24 LAB — LIPASE, BLOOD: Lipase: 20 U/L (ref 11–59)

## 2010-12-24 LAB — HEPATIC FUNCTION PANEL
AST: 28 U/L (ref 0–37)
Albumin: 4 g/dL (ref 3.5–5.2)
Total Protein: 6.4 g/dL (ref 6.0–8.3)

## 2010-12-24 LAB — AMYLASE: Amylase: 52 U/L (ref 0–105)

## 2011-01-02 ENCOUNTER — Emergency Department (HOSPITAL_COMMUNITY)
Admission: EM | Admit: 2011-01-02 | Discharge: 2011-01-02 | Disposition: A | Discharge status: Home / Self Care | Payer: MEDICARE | Attending: Emergency Medicine | Admitting: Emergency Medicine

## 2011-01-02 DIAGNOSIS — J019 Acute sinusitis, unspecified: Secondary | ICD-10-CM | POA: Insufficient documentation

## 2011-01-02 DIAGNOSIS — R51 Headache: Secondary | ICD-10-CM | POA: Insufficient documentation

## 2011-01-04 ENCOUNTER — Emergency Department (HOSPITAL_COMMUNITY)
Admission: EM | Admit: 2011-01-04 | Discharge: 2011-01-04 | Disposition: A | Discharge status: Home / Self Care | Payer: MEDICARE | Attending: Emergency Medicine | Admitting: Emergency Medicine

## 2011-01-04 DIAGNOSIS — R42 Dizziness and giddiness: Secondary | ICD-10-CM | POA: Insufficient documentation

## 2011-01-04 DIAGNOSIS — R51 Headache: Secondary | ICD-10-CM | POA: Insufficient documentation

## 2011-01-09 LAB — COMPREHENSIVE METABOLIC PANEL
AST: 22 U/L (ref 0–37)
AST: 27 U/L (ref 0–37)
Alkaline Phosphatase: 109 U/L (ref 39–117)
BUN: 12 mg/dL (ref 6–23)
BUN: 18 mg/dL (ref 6–23)
CO2: 27 mEq/L (ref 19–32)
CO2: 26 mEq/L (ref 19–32)
Calcium: 9 mg/dL (ref 8.4–10.5)
Chloride: 102 mEq/L (ref 96–112)
Chloride: 102 mEq/L (ref 96–112)
Creatinine, Ser: 0.67 mg/dL (ref 0.4–1.2)
Creatinine, Ser: 0.56 mg/dL (ref 0.4–1.2)
Creatinine, Ser: 0.7 mg/dL (ref 0.4–1.2)
GFR calc Af Amer: >60 mL/min (ref >60)
GFR calc Af Amer: >60 mL/min (ref >60)
GFR calc non Af Amer: >60 mL/min (ref >60)
GFR calc non Af Amer: >60 mL/min (ref >60)
Glucose, Bld: 132 mg/dL — ABNORMAL HIGH (ref 70–99)
Potassium: 4.3 mEq/L (ref 3.5–5.1)
Total Bilirubin: 0.5 mg/dL (ref 0.3–1.2)
Total Bilirubin: 0.3 mg/dL (ref 0.3–1.2)
Total Protein: 5.8 g/dL — ABNORMAL LOW (ref 6.0–8.3)

## 2011-01-09 LAB — DIFFERENTIAL
Basophils Absolute: 0.1 10*3/uL (ref 0.0–0.1)
Basophils Absolute: 0 10*3/uL (ref 0.0–0.1)
Basophils Relative: 1 % (ref 0–1)
Eosinophils Absolute: 0.5 10*3/uL (ref 0.0–0.7)
Eosinophils Absolute: 0.5 10*3/uL (ref 0.0–0.7)
Eosinophils Relative: 8 % — ABNORMAL HIGH (ref 0–5)
Eosinophils Relative: 8 % — ABNORMAL HIGH (ref 0–5)
Lymphocytes Relative: 28 % (ref 12–46)
Lymphocytes Relative: 24 % (ref 12–46)
Lymphs Abs: 1.5 10*3/uL (ref 0.7–4.0)
Monocytes Relative: 11 % (ref 3–12)
Neutro Abs: 2.4 10*3/uL (ref 1.7–7.7)
Neutrophils Relative %: 51 % (ref 43–77)

## 2011-01-09 LAB — CBC
HCT: 34 % — ABNORMAL LOW (ref 36.0–46.0)
HCT: 33.1 % — ABNORMAL LOW (ref 36.0–46.0)
HCT: 37.5 % (ref 36.0–46.0)
Hemoglobin: 11.5 g/dL — ABNORMAL LOW (ref 12.0–15.0)
Hemoglobin: 10.9 g/dL — ABNORMAL LOW (ref 12.0–15.0)
MCHC: 33.1 g/dL (ref 30.0–36.0)
MCV: 92.3 fL (ref 78.0–100.0)
MCV: 92.3 fL (ref 78.0–100.0)
MCV: 92.3 fL (ref 78.0–100.0)
RBC: 3.68 MIL/uL — ABNORMAL LOW (ref 3.87–5.11)
RBC: 4.07 MIL/uL (ref 3.87–5.11)
RDW: 15.3 % (ref 11.5–15.5)
WBC: 6.4 10*3/uL (ref 4.0–10.5)
WBC: 6.7 10*3/uL (ref 4.0–10.5)

## 2011-01-09 LAB — URINALYSIS, ROUTINE W REFLEX MICROSCOPIC
Bilirubin Urine: NEGATIVE
Ketones, ur: NEGATIVE mg/dL
Nitrite: NEGATIVE
Protein, ur: NEGATIVE mg/dL
Specific Gravity, Urine: <1.005 — ABNORMAL LOW (ref 1.005–1.030)
Urobilinogen, UA: 0.2 mg/dL (ref 0.0–1.0)

## 2011-01-09 LAB — LIPASE, BLOOD
Lipase: 16 U/L (ref 11–59)
Lipase: 18 U/L (ref 11–59)

## 2011-01-09 LAB — POCT CARDIAC MARKERS: CKMB, poc: 1.6 ng/mL (ref 1.0–8.0)

## 2011-01-10 LAB — COMPREHENSIVE METABOLIC PANEL
ALT: 153 U/L — ABNORMAL HIGH (ref 0–35)
AST: 50 U/L — ABNORMAL HIGH (ref 0–37)
Albumin: 4.3 g/dL (ref 3.5–5.2)
CO2: 27 mEq/L (ref 19–32)
Calcium: 10.1 mg/dL (ref 8.4–10.5)
Chloride: 95 mEq/L — ABNORMAL LOW (ref 96–112)
Creatinine, Ser: 0.61 mg/dL (ref 0.4–1.2)
GFR calc Af Amer: >60 mL/min (ref >60)
GFR calc non Af Amer: >60 mL/min (ref >60)
Sodium: 132 mEq/L — ABNORMAL LOW (ref 135–145)
Total Bilirubin: 0.5 mg/dL (ref 0.3–1.2)

## 2011-01-10 LAB — URINALYSIS, ROUTINE W REFLEX MICROSCOPIC
Glucose, UA: NEGATIVE mg/dL
Leukocytes, UA: NEGATIVE
Specific Gravity, Urine: 1.025 (ref 1.005–1.030)
pH: 5.5 (ref 5.0–8.0)

## 2011-01-10 LAB — CBC
MCV: 92.6 fL (ref 78.0–100.0)
Platelets: 367 10*3/uL (ref 150–400)
RBC: 4.81 MIL/uL (ref 3.87–5.11)
WBC: 13.8 10*3/uL — ABNORMAL HIGH (ref 4.0–10.5)

## 2011-01-10 LAB — DIFFERENTIAL
Eosinophils Relative: 5 % (ref 0–5)
Lymphocytes Relative: 20 % (ref 12–46)
Lymphs Abs: 2.8 10*3/uL (ref 0.7–4.0)
Monocytes Relative: 7 % (ref 3–12)

## 2011-01-10 LAB — URINE MICROSCOPIC-ADD ON

## 2011-01-10 LAB — LIPASE, BLOOD: Lipase: 20 U/L (ref 11–59)

## 2011-01-11 LAB — POCT HEMOGLOBIN-HEMACUE: Hemoglobin: 12.3 g/dL (ref 12.0–15.0)

## 2011-01-13 LAB — DIFFERENTIAL
Eosinophils Relative: 3 % (ref 0–5)
Lymphocytes Relative: 26 % (ref 12–46)
Lymphs Abs: 1.6 10*3/uL (ref 0.7–4.0)
Monocytes Absolute: 0.5 10*3/uL (ref 0.1–1.0)

## 2011-01-13 LAB — URINALYSIS, ROUTINE W REFLEX MICROSCOPIC
Bilirubin Urine: NEGATIVE
Glucose, UA: NEGATIVE mg/dL
Hgb urine dipstick: NEGATIVE
Ketones, ur: NEGATIVE mg/dL
pH: 6 (ref 5.0–8.0)

## 2011-01-13 LAB — TYPE AND SCREEN

## 2011-01-13 LAB — BASIC METABOLIC PANEL
Chloride: 108 mEq/L (ref 96–112)
GFR calc non Af Amer: >60 mL/min (ref >60)
Glucose, Bld: 100 mg/dL — ABNORMAL HIGH (ref 70–99)
Potassium: 3.1 mEq/L — ABNORMAL LOW (ref 3.5–5.1)
Sodium: 142 mEq/L (ref 135–145)

## 2011-01-13 LAB — CBC
HCT: 37.6 % (ref 36.0–46.0)
Hemoglobin: 12.6 g/dL (ref 12.0–15.0)
WBC: 6.1 10*3/uL (ref 4.0–10.5)

## 2011-01-18 LAB — DIFFERENTIAL
Eosinophils Relative: 3 % (ref 0–5)
Lymphocytes Relative: 22 % (ref 12–46)
Lymphs Abs: 1.8 10*3/uL (ref 0.7–4.0)
Monocytes Absolute: 0.7 10*3/uL (ref 0.1–1.0)

## 2011-01-18 LAB — CBC
HCT: 33.9 % — ABNORMAL LOW (ref 36.0–46.0)
Hemoglobin: 11.4 g/dL — ABNORMAL LOW (ref 12.0–15.0)
Platelets: 252 10*3/uL (ref 150–400)
WBC: 8.1 10*3/uL (ref 4.0–10.5)

## 2011-01-18 LAB — POCT I-STAT, CHEM 8
BUN: 28 mg/dL — ABNORMAL HIGH (ref 6–23)
Creatinine, Ser: 0.7 mg/dL (ref 0.4–1.2)
Sodium: 140 mEq/L (ref 135–145)
TCO2: 26 mmol/L (ref 0–100)

## 2011-01-29 ENCOUNTER — Emergency Department (HOSPITAL_COMMUNITY)
Admission: EM | Admit: 2011-01-29 | Discharge: 2011-01-29 | Disposition: A | Discharge status: Home / Self Care | Payer: MEDICARE | Attending: Emergency Medicine | Admitting: Emergency Medicine

## 2011-01-29 DIAGNOSIS — K219 Gastro-esophageal reflux disease without esophagitis: Secondary | ICD-10-CM | POA: Insufficient documentation

## 2011-01-29 DIAGNOSIS — G43909 Migraine, unspecified, not intractable, without status migrainosus: Secondary | ICD-10-CM | POA: Insufficient documentation

## 2011-01-29 DIAGNOSIS — Z79899 Other long term (current) drug therapy: Secondary | ICD-10-CM | POA: Insufficient documentation

## 2011-02-08 ENCOUNTER — Emergency Department (HOSPITAL_COMMUNITY)
Admission: EM | Admit: 2011-02-08 | Discharge: 2011-02-08 | Disposition: A | Discharge status: Home / Self Care | Payer: MEDICARE | Attending: Emergency Medicine | Admitting: Emergency Medicine

## 2011-02-08 DIAGNOSIS — Z0389 Encounter for observation for other suspected diseases and conditions ruled out: Secondary | ICD-10-CM | POA: Insufficient documentation

## 2011-02-11 ENCOUNTER — Emergency Department (HOSPITAL_COMMUNITY)
Admission: EM | Admit: 2011-02-11 | Discharge: 2011-02-11 | Disposition: A | Discharge status: Home / Self Care | Payer: MEDICARE | Attending: Emergency Medicine | Admitting: Emergency Medicine

## 2011-02-11 DIAGNOSIS — K219 Gastro-esophageal reflux disease without esophagitis: Secondary | ICD-10-CM | POA: Insufficient documentation

## 2011-02-11 DIAGNOSIS — G43909 Migraine, unspecified, not intractable, without status migrainosus: Secondary | ICD-10-CM | POA: Insufficient documentation

## 2011-02-11 DIAGNOSIS — Z79899 Other long term (current) drug therapy: Secondary | ICD-10-CM | POA: Insufficient documentation

## 2011-02-17 ENCOUNTER — Emergency Department (HOSPITAL_COMMUNITY)
Admission: EM | Admit: 2011-02-17 | Discharge: 2011-02-17 | Disposition: A | Discharge status: Home / Self Care | Payer: MEDICARE | Attending: Emergency Medicine | Admitting: Emergency Medicine

## 2011-02-17 DIAGNOSIS — J019 Acute sinusitis, unspecified: Secondary | ICD-10-CM | POA: Insufficient documentation

## 2011-02-17 DIAGNOSIS — R51 Headache: Secondary | ICD-10-CM | POA: Insufficient documentation

## 2011-02-17 DIAGNOSIS — H53149 Visual discomfort, unspecified: Secondary | ICD-10-CM | POA: Insufficient documentation

## 2011-02-20 NOTE — Discharge Summary (Signed)
[name], [name]               ACCOUNT NO.:  [id number]   MEDICAL RECORD NO.:  [id number]          PATIENT TYPE:  INP   LOCATION:  3035                         FACILITY:  [facility]   PHYSICIAN:  Kerrin Champagne, M.D.   DATE OF BIRTH:  November 20, 1960   DATE OF ADMISSION:  08/10/2008  DATE OF DISCHARGE:  08/14/2008                               DISCHARGE SUMMARY   ADMISSION DIAGNOSES:  1. Painful degenerative disk disease at L4-5 and L5-S1.  2. Diskography demonstrating concordant pain at L4-5 and L5-S1.  3. Status post anterior cervical diskectomy and fusion at C5-6 and C6-      7.  4. Tobacco abuse.  5. History of peptic ulcer disease and gastroesophageal reflux      disease.   DISCHARGE DIAGNOSES:  1. Painful degenerative disk disease at L4-5 and L5-S1.  2. Diskography demonstrating concordant pain at L4-5 and L5-S1.  3. Status post anterior cervical diskectomy and fusion at C5-6 and C6-      7.  4. Tobacco abuse.  5. History of peptic ulcer disease and gastroesophageal reflux      disease.  6. Acute blood loss anemia, requiring blood transfusion.   PROCEDURE:  On August 10, 2008, the patient underwent:  1. Transforaminal lumbar interbody fusion at right L5-S1, left L4-5      transforaminal lumbar interbody fusion.  Posterolateral fusion at      L4-S1 with combination of Vitoss and local bone graft.  2. Facet ablation, right side at L4-5 and left side at L5-S1.      Instrumentation L5-S1 posteriorly using pedicle screws and rod      instrumentation.  3. Aspiration of bone marrow, left S1 pedicle and S1 vertebral body.      This was performed by Dr. Otelia Sergeant, assisted by Maud Deed, [facility],      under general anesthesia.   CONSULTATIONS:  None.   BRIEF HISTORY:  The patient is a 49 year old female with a prolonged  history of both cervical and lumbar back pain.  She is status post  previous anterior cervical diskectomy and fusion at C5-6 and C6-7 in  2007.  She required a  revision of the fusion and vertebrectomy in 2009.  She has done well from a standpoint of this surgery, but now continues  to have lumbar discomfort.  Evaluation including MRI scan has shown  significant degenerative disk disease with lateral recess narrowing.  No  specific spinal stenosis or nerve root compression noted.  Diskography  was performed indicating a normal L3-4 level with non-concordant pain.  Injections of L4-5 and L5-S1 did replicate the patient's pain.  She was  noted to have Modic changes at the L5-S1 level consistent with this  being the source of her pain and also rents in the disk at the left side  at L4 and the right side at L5-S1 corresponding tears injuring the  neuroforamen near the nerve root at each level.  It was felt that she  would benefit from surgical intervention and was admitted for the  procedure as stated above.   BRIEF HOSPITAL COURSE:  The patient tolerated the procedure under  general anesthesia without complications.  Postoperatively,  neurovascular and motor function in the lower extremities was noted to  be intact.  Hemovac drain was discontinued on the first postoperative  day and dressing changes were done daily thereafter with the wound  healing without signs of infection or drainage.  The patient was started  on physical therapy and occupational therapy.  She was slow to progress  with ambulation.  She did develop some symptoms of dizziness which was  related to acute blood loss anemia.  Hemoglobin and hematocrit dropped  to 7.4 and 21.9 at which time she received 1 unit of autologous blood  and 1 unit of banked blood with packed red blood cells.  The patient's  symptoms improved after the blood transfusion and she was able to  ambulate as much as 210 feet with a rolling walker.  She did have severe  reflux during the hospital stay requiring use of Protonix and Reglan.  Prior to discharge, she was taking a regular diet and having large   amounts of flatus and she was able to have a bowel movement.  The  patient's Foley catheter discontinued and she was able to void without  difficulty.  Neurovascular motor function remained intact in the lower  extremities.  On August 14, 2008, she was stable for discharge home.   OTHER PERTINENT LABORATORY VALUES:  Hemoglobin and hematocrit after  transfusion returned to 8.4 and 24.9 respectively.  On admission,  coagulation study was within normal limits as was chemistry studies.  Urinalysis on admission negative for urinary tract infection.  On  admission, total protein noted to be 5.8 with ALT 123.  No chest x-ray  or EKG on the chart at the time of this dictation.   PLAN:  The patient was discharged to her home.  Arrangements were made  for all necessary durable medical equipment.  The patient was instructed  to change her dressing daily.  She will be allowed to shower if there is  no wound drainage.  The patient will continue to wear her Aspen LSA at  all times.  Prior to discharge, she did demonstrate the ability to don  and doff the brace independently.  She will ambulate with a walker.  She  will continue on a regular diet.  She is instructed to follow up in the  office 2 weeks from surgery.  She will resume home medications as taken  prior to admission and given a medication reconciliation form with these  instructions.  Prescriptions at discharge include OxyContin 20 mg q.12  h. and Percocet 5/325 one-two every 4-6 hours as needed for pain.  Robaxin 500 mg 1 every 8 hours as needed for spasm.  She is instructed  to ambulate as much as tolerated.  She will avoid bending, lifting, and  twisting.  No driving until return office visit.  She may have her brace  off for sleeping purposes only.   CONDITION ON DISCHARGE:  Stable.      Wende Neighbors, P.A.      Kerrin Champagne, M.D.  Electronically Signed    SMV/MEDQ  D:  10/14/2008  T:  10/15/2008  Job:  [id number]

## 2011-02-20 NOTE — Op Note (Signed)
[name], [name]               ACCOUNT NO.:  [id number]   MEDICAL RECORD NO.:  [id number]          PATIENT TYPE:  INP   LOCATION:  3035                         FACILITY:  [facility]   PHYSICIAN:  Kerrin Champagne, M.D.   DATE OF BIRTH:  01/14/1961   DATE OF PROCEDURE:  08/10/2008  DATE OF DISCHARGE:                               OPERATIVE REPORT   PREOPERATIVE DIAGNOSES:  1. Painful degenerative disk disease, L4-L5 and L5-S1 with Modic      changes end-plates at U0-A5.  2. Black disk at the L4-L5 level.  3. Diskography demonstrating concordant pain at L4-L5 and L5-S1.  4. Non-concordant pain with normal diskogram at L3-4.   POSTOPERATIVE DIAGNOSES:  1. Painful degenerative disk disease, L4-L5 and L5-S1 with Modic      changes end-plates at W0-J8.  2. Black disk at the L4-L5 level.  3. Diskography demonstrating concordant pain at L4-L5 and L5-S1.  4. Non-concordant pain with normal diskogram at L3-4.   PROCEDURE:  1. Transforaminal lumbar interbody fusion, right L5-S1 with 8-mm Dupuy      lordotic cage and local bone graft.  2. Left L4-5 transforaminal lumbar interbody fusion with a Dupuy 7 mm      parallel cage and local bone graft.  3. Posterior lateral fusion L4-S1 with combination of VITOSS and local      bone graft.  4. Facet ablation on the right-sided L4-L5, left side L5-S1.  5. Instrumentation L4-S1 posteriorly using pedicle screws and rods,      Monarch type.  6. Aspiration of bone marrow, left S1 pedicle and S1 vertebral body.   SURGEON:  Kerrin Champagne, MD   ASSISTANT:  Wende Neighbors, PA-C   ANESTHESIA:  General via orotracheal intubation by Dr. Sondra Come, right  central line placed by Dr. Sondra Come in the preop holding area.  Local  infiltration of Marcaine 0.5% with 1:200,000 epinephrine 20 mL.   FINDINGS:  As above.  Severe degenerative disk disease at L4-5 and L5-  S1.   ESTIMATED BLOOD LOSS:  550 mL.  Cell Saver returned hypercrit blood 150  mL.   COMPLICATIONS:  None.   DRAINS:  Foley to straight drain.  Hemovac x1 right lower lumbar.   CONDITION:  The patient returned to the PACU in good condition.   HISTORY OF PRESENT ILLNESS:  The patient is a 50 year old female with  prolonged history of both neck and lower back pain.  She has undergone  previous anterior cervical diskectomy and fusion in 2007.  This went on  to failure with loosening of cages at the anterior diskectomy and fusion  site at C5-6, C6-7.  She underwent removal of cages and vertebrectomy at  the C6 level with strut graft and local bone graft and plating at this  level nearly 4 months ago.  She has done well, went on to heal the  fusion site and decompression.  Following this surgery she has had  significant relief of neck, shoulder, and arm pain with persistent pain  that is significant into her lower back, continues to require  significant doses of narcotic  medicines and medicines for depression.  She underwent preoperative evaluation.  She has been through physical  therapy on multiple occasions, has been treated with antiinflammatory  agents without relief and has significant allergies to most nonsteroidal  antiinflammatory agents.  She has had multiple abdominal procedures.  Her preoperative evaluation including MRI study has shown that she has  significant degenerative disk changes, some areas of lateral recess  narrowing but no significant spinal stenosis and no significant nerve  root compression.  Preoperative diskography indicated a normal L3-4  level with non-concordant pain.  Injections above L4-5 and L5-S1 did  replicate the patient's pain in usual fashion as at home.  She also  demonstrated Modic changes at the L5-S1 level consistent with source for  her pain.  She was found to also have rents in the disk on the left side  at L4-5 level, on the right side at L5-S1 corresponding tears entering  the neuroforamen, near the nerve root at each level.    INTRAOPERATIVE FINDINGS:  As above.   DESCRIPTION OF PROCEDURE:  The patient underwent first placement of the  central line as there was difficulty obtaining any peripheral access.  She then had neuro-electro monitoring leads placed.  She was then  brought to the operating room after undergoing standard preoperative  antibiotics with vancomycin.  After adequate general anesthesia, the  patient was then turned to a prone position in the [place] spinal table.  Radiolucent table was used, arms to the side were well padded 90 and 90  in all positions.  Iliac crest and all pressure points were well padded.  PAS stockings were used in both lower extremities.  She had standard  prep with DuraPrep solution from the lower dorsal spine to the mid  sacral segment.  It was draped in the usual manner.  Iodine Vi-Drape was  used.  Standard midline incision was made and extended proximately at  the L2-3 level extended to the mid sacral level after infiltration with  Marcaine 0.5% with 1:200,000 epinephrine.  Incision was carried sharply  through skin and subcu layers all the way down to the spinous processes.  Incisions on the both sides of the spinous process carried down at the  L2, L3, L4, L5, S1, and S2 levels bilaterally preserving the  interspinous ligament and supraspinous ligament were possible.  Cobb  used to elevate the paralumbar muscles both sides exposing the sacrum  inferiorly and then continuing superiorly using electrocautery to  incise.  The paralumbar muscles off the posterior elements both sides  exposing the posterior elements to the facets at the lumbosacral joint,  L4-5, L5-S1, and L3-4 preserving the facet capsules at L3-4 and L2-3.  Bleeders were controlled using monopolar and bipolar electrocautery.  Initially cerebellar retractors were used.  Two clamps placed on the low  spinous process expected to be in L5-L4 and intraoperative lateral C-arm  and AP C-arm demonstrated the  clamps on this spinous process of L5 and  of L4.  These were marked by electrocautery.  A single incision on top  of each of the spinous process for continued identification.  Exposure  was then carried out exposing the sacral ala bilaterally releasing and  removing the facet capsules at L5-S1 and at L4-5 bilaterally extending  the exposure out to the transverse process of L5 bilaterally dividing  the paralumbar muscles to the intertransverse membrane both sides.  This  was carried up to the L4 level and the L4 transverse process.  Preserving  the facet capsule at L3-4 and cauterizing facet bleeders were  necessary both sides exposing the tips of the transverse process of L4  bilaterally, L5 bilaterally, sacral ala bilaterally.  Harmonic retractor  was then placed, bleeding well controlled, packing of the lateral  gutters were necessary.  A 1/4-inch osteotome was then used to resect  the inferior articular process on the left side of the L4, inferior  articular process and portion of the lamina laterally.  This was then  removed and preserved for later bone grafting purposes.  Additional bone  graft was obtained from the superior articular process of L5 over the  left side of neuroforamen, foraminotomized using 3-mm Kerrison after  resection with osteotome.  Partial hemilaminectomy was carried out over  left L4 lamina up to the attachment of the ligament of flavum here.  Then out over the pars area to the inferior aspect of the pedicle of L4  on the left side to ensure adequate decompression of the left L4 nerve  root.  Foraminotomy performed over the left L5 nerve root and resection  performed with a small amount of superior aspect of the lamina of the L5  on the left side in order to perform foraminotomy.  Then resect the  medial portion of the facet of the superior articular process of L5 on  the left side.  With this, the ligament of flavum was easily lifted and  resected using 3-mm  Kerrison.  Kerrison was used over the foraminotomy  portion of resection, so the residual remaining portions of the superior  and inferior articular process of L5 and L4.  Ligament of flavum was  resected out laterally decompressing the L4 nerve root on the left side  nicely.  The L4 nerve root was retracted superiorly as well as thecal  sac at this level retracted medially in the L4-5 disk entered using a 15-  blade scalpel following biopolar electrocautery evading epidural veins.  A 15-blade scalpel was used to incise the disk space.  Pituitary rongeur  was used then to debride the disk space or any residual disk material.  A vertebral disk space then carefully debrided using pituitary rongeurs.  Dilation was carried out first with 7-mm then 8-mm dilator, 9-mm could  not be inserted or returned to the 9-mm position.  At this point,  bleeders were controlled using bipolar electrocautery.  Gelfoam thrombin-  soaked placed into the lateral aspect of the spinal canal of the L4-5  level and cottonoid.  Attention was then turned to the right side of the  L5-S1 where similarly the inferior articular process of L5 on the right  side, superior articular process of S1 were resected using osteotome  mallet.  Preserving the ligament of flavum then using 3 mm Kerrison to  resect a portion of the inferior aspect of the lamina of L5 on the right  side up to the pars area resecting the pars over the right L5-S1  neuroforamen.  The superior articular process of S1 was resected as  noted and residual was then debrided using the 3-mm Kerrison.  All bone  was carefully preserved for later bone grafting purposes.  The ligament  of flavum left in place, a 3-mm Kerrison then used to make an entry  point over the superior aspect of S1 and foraminotomy performed over the  S1 nerve root resecting the medial aspect of the superior articular  process of S1 on the right side up along the pedicle of S1 and  then at   the neuroforamen for L5.  The L5 nerve root completely decompressed.  Half of the ligament of flavum medial portion was left intact.  The  lateral half over 75% was resected using 3-mm Kerrison and Cushing.  At  this point, attention turned to the right L5-S1 disk space, Penfield #4  used carefully, mobilized the thecal sac in the L5 nerve root.  The 15-  blade scalpel was then used to incise the disk protecting the thecal sac  at the L5 nerve root with D'Errico retractors.  Disk space was then  entered and pituitary rongeurs were used to debride disk space for any  degenerative disk material.  C-arm fluoro was then used to carefully  position on the left side of the L4 intersection of the pedicle of L4  with the transverse process of L4 and the lateral aspect of the facet  joint at L3-4.  High-speed burr was used to carefully remove a small  portion of the medial aspect of the pedicle of L4 and inferior articular  process of L3 and L4 for positioning of the awl in the correct location  at the intersection of these marks.  A hand-held straight pedicle finder  was then used to probe the pedicle at this level converging correct  degree entering the marrow cavity for the pedicle on the left side at  L4.  Observed on C-arm fluoro to be in excellent position alignment  measured at about 40-45 mm, 40 mm x 6.25 screw was chosen to the small  size of the patient's pedicle.  Tapping performed using a 5.5 tap,  unable to aspirate bone marrow at this because of the small opening and  inability to introduce the aspiration equipment for the VITOSS.  High-  speed burr was then used to decorticate transverse process of L4 in the  left following tapping with 5.5 tap and using a ball-tip probe to ensure  patency of the pedicle and no sign of broaching.  A 40 mm x 6.25 screws  were then introduced and screwed into place and good position alignment  observed on C-arm fluoro.  Next, turning to the L5 level  similarly an  awl was used to make an entry point over the lateral aspect of the  pedicle of L5 at its intersection of the superior articular process of  L5 and lateral portion of the pedicle here.  High-speed burr was used to  carefully remove portion of the facet on the left side at this level in  order to allow for the entry point.  A straight pedicle finder then used  to probe the pedicle on the left side at L5 and correct degree of  convergence lordosis observed on C-arm fluoro to be in good position  alignment at 40 mm measured by 7 mm expected width.  Tapping with a 6.25  tap, attempts for aspiration for bone marrow unsuccessful due to  inability to obtain air tight closure between the wall of the tapped  pedicle.  Therefore, decortication was carried out at the transverse  process of L5 in the left side and appropriate-size screw 40 mm x 7.0  was then introduced into the pedicle on the left side of L5.  This  obtained excellent purchase over the screw.  Fastener were then  carefully loosened to allow for accepting the rod.  Finally at the S1  level in the left side an awl was used to make an entry point after  osteotomizing  the superior articular process of S1 in order to obtain  the entry point at the intersection of the superior articular process  with the ala.  The awl was used to make an entry point under C-arm  fluoro to correct position alignment, then a hand-held straight pedicle  finder introduced and probed to 30 mm.  A 7.0 x 30 mm screw was chosen.  Tapping carried out and following aspiration with aspiration equipment  nearly 10 mL of bone marrow over multiple level while inserting.  This  obtained adequate bone marrow and this was applied to the VITOSS  charging it.  Tapping was then carried out on left S1 pedicle using a  6.25 tap.  A 7.0 x 30 mm screw was introduced on the left side following  decortication in the sacral ala reduction of bone graft.  Once the screw   was placed and observed to be in excellent position alignment of C-arm  fluoro then VITOSS was then applied from the sacral ala, left-sided S1  to the transverse process of L5, to the transverse process of L4.  Attention then turned to the right side and at this level similar to  that of the left the patient then had pedicle screws placed first on the right side at L4 using a high-speed burr to remove a small portion of  inferior aspect of the facet laterally at the L4 level then introducing  it all for an entry point over the lateral aspect of the pedicle, than  using a straight pedicle probe, probing the pedicle to 14 mm with an  expected width of 6.25 tapping with a 5.5 tap observing with ball-tip  probe.  There was no broaching of cortex as was carried out on the  opposite site at all 3 levels.  This was similarly carried out on the  right side, first at L4.  This was then tapped with a 5.5 tap and a 40  mm x 6.25 screw placed on the right side at L4, obtaining excellent  purchase.  Decortication was carried over the transverse process of L4  on the right and VITOSS applied prior to the placement of the screw.  Then on the right side the L5 level similarly an awl was used to make an  entry point after some decortication at lateral aspect of facet of L4-5  was carried out; initial entry port was high.  This was then carried  down to lower and an awl was used to make an initial entry point, a  straight pedicle probe was then used to probe the pedicle, correct  position alignment, correct lordosis, and convergence.  This was checked  with ball-tip probe, tapped with a 6.25 tap and a 40 mm x 7.0 screw  placed on the right side at the L5 level obtaining excellent purchase.  Final screw placed on the right side at S1 level was carried out.  Note  the decortication was carried out over the transverse process of L5 on  the right side.  VITOSS applied here as well.  At the S1 level on the   right side the facet on this side was decorticated and osteotomized in  order to find the intersection of the superior articular process of L5  with the sacral ala most inferior aspect of the superior articular  process.  A awl was used to make an additional entry point and observed  on C-arm fluoro to be in good position and alignment.  This was  then  exchanged for a straight pedicle finder, probed to 35 mm on the right  side, a 7.0 x 35 mm screw was used at this level tapping with a 6.25 tab  using a ball-tip probe to ensure patency of the pedicle to ensure no  broaching of cortex.  The 35 x 7.0 screw were then placed on the right  side of the S1 level obtaining moderate purchase.  Note that  decortication was carried down over the ala and additional VITOSS was  supplied to the right side.  The S1 ala carried to the transverse  process of L5 and to the transverse process of L4.  Each of the pedicle  screw heads were then carefully loosened using a pedicle fastener,  breaker.  These were then measured for soft tissue resistance using the  neuromonitoring equipment.  All levels measured greater than 33 with the  right L5 and S1 screws measuring greater than 60.  The right L4  measuring 61, left 32, left L5 34, and the left S1 measuring in the 34  degree range as well.  With these areas bone grafted, the testing having  been carried out, attention was turned to perform a TLIF first on the  right side of the L5-S1 level where the disk space was dilated using  dilator to provide first 7 mm then 8 mm, attempts of placing 9 were  successful.  Disk space was debrided using a laminar spreader between  the lamina of S1 and lamina of L5 and L4 on the right.  Dilating the  disk space allowing for introduction of curette, curettaged the end  plates using the straight curette and then ring curettes were used to  further debride this.  The disk was quite degenerated.  There was very  little remaining  residual disk.  All that was found to be present was  quite dehydrated, degenerated in appearance.  Observing with the Loupe  magnification and headlamp, the disk space appeared to be well down the  bleeding end-plate bone surfaces.  Then trialing the disk space 7 and 8  mm trial was able to be placed.  A 8-mm lordotic cage was chosen.  This  was packed with bone graft that had been harvested locally.  Additional  bone graft was then placed into the intervertebral disk space.  This was  impacted using the trial guide in order to impact the bone graft to  provide space for the cage.  An 8 mm x 23 mm lordotic cage, smaller cage  was then packed with bone graft.  This was then introduced into the disk  space and packed in into place approximately 35 degrees of angulation of  convergence distracting the disk space.  Sub-setting it beneath the  posterior aspect of the vertebral bodies of L5 and S1 by approximately 4  mm.  I observed on C-arm fluoro to be in excellent position of  alignment.  The insertion device was then carefully removed, note that  the tie-ins that were removed were placed medially for the insertion of  this lordotic concord cage.  With this end, the distraction was  released, careful inspection of the spinal canal demonstrated no bone  within the canal, hockey stick nerve probe could be easily passed via S1  and L5 neuroforamen.  Then attention was turned to the left side after  first countering a 65 mm rod and placing it within the fasteners on the  right side placing the fastener caps.  Then turning to the left side the  patient's TLIF on the left side at L4-5 was carried out after dilating  the disk space using 7 and 8 mm dilators.  The intervertebral disk space  was debrided.  Attempts to use a curette was unsuccessful due to the  narrowness of the disk space, so the ring curettes were used both  straight and upbiting and this provided adequate debridement of the disk   material.  Pituitary rongeur was used to debride and degenerate disk  material and end plate were necessary, was debrided down to the bony end  plates.  Trialing carried out.  A 7-mm trial could be introduced.  No  attempt was made to place a 8-mm trial after C-arm fluoro demonstrated  this cage well seated and there was difficulty extracting the cage.  A  slap hammer was used to remove this.  A 7-mm parallel cage was chosen.  There is no 7-mm lordotic cage available to the size.  The 7 mm parallel  cage was then packed with bone graft locally obtained.  Additional local  bone graft was then packed into the intervertebral disk space and packed  into place using a 7 mm trial.  This allowed for enough space, also the  lamina spreader was used to spread the lamina to allow and reduction of  the cage with less stress on the walls posteriorly.  A 23 mm x 7 mm cage  was then placed into the vertebral disk space and packed into place  about 30-35 degrees of convergence.  The lamina spreader was then  removed after C-arm fluoro demonstrated the cage subset nicely beneath  posterior aspect of the disk space by 3-4 mm well within the central  portion of the disk space.  After removing lamina spreader, careful  inspection of the spinal canal demonstrates small portions of bone that  were residual and these were removed.  These represented bone graft that  had come out of either of the cage or from the bone graft placed in the  intervertebral disk place.  Care was taken to ensure the spinal canal, was well decompressed using hockey stick nerve probe passing both the L4  and L5 neuroforamen demonstrating the patency.  Thrombin-soaked Gelfoam  was then placed over the posterior aspect of the laminotomy region on  the left side here as well as L5-S1 on the right.  A 65-mm rod then  placed within the fastener heads on the left side, then the caps then  placed for these fasteners at each level.  The rods  were then carefully  fastened to the fastener by tightening the cap head to 80 foot-pound at  the L4 level first on the left side then on the right side.  Compression  obtained between the fastener heads on the left side at the level of  TLIF and the left fastener at L5 was then carefully tightened to 80 foot-  pounds.  Then on the right side the compression was obtained between  fastener heads, L4, and L5, and fastener at L5 was then tightened at 80  foot-pounds.  On the right side the fastener between L5 and S1 was then  compressed using compressor and the cap tightened at 80 foot-pound at S1  compressing the L5-S1 level on the right side.  Similarly this was  carried out then on the left side compressing between the L5 fastener  head and the S1 head on the left side and then  tightening the cap at the  S1 level to 80 foot-pounds.  This completed again.  Careful inspection  demonstrated patency of the neuroforamen of L4 on the left, L5 on the  right, S1 on the right and L5 on the left.  These demonstrated well-  decompressed spinal canal.  No bone retropulsed at any of the segments,  every level well decompressed.  No active bleeding was felt to be  present.  A residual bone graft was then applied to the areas of  resected facet on the right side at L4-5, on the left side at L5-S1.  These facets have been resected using osteotomes as well high-speed  burr.  The bone material was used for bone grafting purposes.  VITOSS  was also applied in both of these areas.  Some additional local bone  graft was placed over the left side between L5 and S1 to reinforce this  area, posterolateral fusion.  Irrigation was carried out.  Permanent C-  arm images obtained in AP and lateral planes.  Medium Hemovac drain  placed on the right side of the lower lumbar spine exiting out the right  lower lumbar level.  Soft tissues were then allowed to fall back into  place.  Care was taken to note there was no  soft tissue or paralumbar  muscle requiring excision for devitalized appearance.  The lumbar dorsal  fascia was reapproximated in the midline to the supraspinous ligament  using interrupted #1 Vicryl sutures.  Deep subcu layers approximated  with interrupted 0 Vicryl sutures, more superficial with interrupted 2-0  Vicryl sutures.  Skin closed with a running subcu stitch of 4-0 Vicryl  and Dermabond applied.  A 4 x 4s, ABD pad fixed to the skin with Hypafix  tape.  The patient was then returned to supine position, reactivated,  extubated, and returned to the recovery room in satisfactory condition.  All instruments and sponge counts were correct.  Note that,  preoperatively the patient had marking of the right side at L5-S1 level  and left side at L4-5 for preoperative localization of her surgery.  Intraoperatively time-out was carried out identifying the patient,  procedure, the participants in the operation.      Kerrin Champagne, M.D.  Electronically Signed     JEN/MEDQ  D:  08/10/2008  T:  08/11/2008  Job:  [id number]

## 2011-02-20 NOTE — H&P (Signed)
[name], MONFILS NO.:  [id number]   MEDICAL RECORD NO.:  [id number]          PATIENT TYPE:  AMB   LOCATION:  DAY                           FACILITY:  [facility]   PHYSICIAN:  Dalia Heading, M.D.  DATE OF BIRTH:  [birth year]   DATE OF ADMISSION:  09/09/2007  DATE OF DISCHARGE:  LH                              HISTORY & PHYSICAL   CHIEF COMPLAINT:  Recurrent incisional hernia.   HISTORY OF PRESENT ILLNESS:  The patient is a 50 year old white female  who was referred for evaluation and treatment of a recurrent incisional  hernia.  She has had multiple abdominal surgeries and herniorrhaphies in  the past.  She is presenting with pain and swelling along the superior  aspect of a midline incision.  No vomiting is noted.  Some nausea is  noted.   PAST MEDICAL HISTORY:  Includes anxiety, chronic back pain.   PAST SURGICAL HISTORY:  Multiple abdominal surgeries for prolapsed  bowel, adhesive disease, recurrent hernias, a partial hysterectomy, neck  fusion.   CURRENT MEDICATIONS:  Aciphex, Valium, Premarin, Neurontin.   ALLERGIES:  PENICILLIN, ASPIRIN PRODUCTS, AVELOX, TORADOL, THOUGH SHE  CAN TAKE KEFLEX WITHOUT DIFFICULTY.   SOCIAL HISTORY:  The patient smokes a pack of cigarettes a day.  She  denies any alcohol use.   She denies any other cardiopulmonary difficulties or bleeding disorders.   PHYSICAL EXAMINATION:  GENERAL:      The patient is a well-developed,  well-nourished white female in no acute distress.  LUNGS:  Clear to auscultation with equal breath sounds bilaterally.  HEART:  Examination reveals a regular rate and rhythm without S3, S4, or  murmurs.  ABDOMEN:  Soft and nondistended.  She is tender in the superior aspect  of a midline incision with a reducible hernia present. Several old  sutures are palpable.  No hepatosplenomegaly or masses are noted.   IMPRESSION:  Recurrent incisional hernia.   PLAN:  The patient is scheduled for a  laparoscopic recurrent incisional  herniorrhaphy with mesh on September 08, 2007.  The risks and benefits of  the procedure including bleeding, infection, bowel injury, recurrence of  the hernia, and the possibility of an open procedure were fully  explained to the patient, who gave informed consent.      Dalia Heading, M.D.  Electronically Signed     MAJ/MEDQ  D:  09/02/2007  T:  09/02/2007  Job:  [id number]   cc:   Melvyn Novas, MD  Fax: [phone]   Short Stay [facility]

## 2011-02-20 NOTE — Assessment & Plan Note (Signed)
NAMEMarland Kitchen  [name], [name]                CHART#:  [id number]   DATE:  09/15/2008                       DOB:  1961/04/14   REASON FOR VISIT:  Intermittent diarrhea, constipation, and nausea.   HISTORY OF PRESENT ILLNESS:  The patient is a 50 year old female who 5  years ago had rectal prolapse.  She reports they took 3-4 feet of colon  out.  She also had an appendectomy at that time.  She subsequently  developed 2 bowel obstructions and required abdominal surgery for lysis  of adhesions.  She also reports she was diagnosed with irritable bowel  syndrome.  She had back surgery 3-4 weeks ago and the brace is really  irritating her bowels.  She felt like her bowels are ruining her life.  She has so much abdominal cramping that it feels like a baby is  kicking.  She has persistent left lower quadrant pain.  Her last  colonoscopy was at [facility] within the last 5 years.  Her  abdominal pain is associated with the sweat.  Actually, bowel movement  makes them better.  She has 1-2 bowel movements daily.  She has had  episodes where she has just watery stool.  This is followed in the  following days by 8-9 bowel movements, which are formed.  She stopped  having bowel movement because it causes her abdomen hurt.  She did have  a CT scan of the abdomen and pelvis with contrast in November 2009,  which revealed a normal liver, spleen, pancreas, bile duct, and  gallbladder.  She had a large amount of stool throughout her colon.   PAST MEDICAL HISTORY:  1. Irritable bowel syndrome.  2. GERD.  3. Asthma.  4. Degenerative disk disease in her back.   PAST SURGICAL HISTORY:  1. Back surgery.  2. Hysterectomy in 1984 for excessive bleeding.   ALLERGIES:  Penicillin causes anaphylactic shock.   MEDICATIONS:  1. Proventil as needed.  2. Kapidex daily.  3. Percocet 3 a day.  4. Flexeril 2 a day.  5. Multivitamin.  6. Iron once a day.  7. Calcium.   FAMILY HISTORY:  She was adopted.   SOCIAL HISTORY:  She is single and has 1 child, who is 98.  She is  disabled from back disease.  She denies any tobacco or alcohol use.   REVIEW OF SYSTEMS:  Per the HPI, otherwise all systems are negative.   PHYSICAL EXAMINATION:  VITAL SIGNS:  Weight 127 pounds, height 5 feet 4  inches, BMI 21.8 (healthy), temperature 98, blood pressure 110/80, and  pulse 80.  GENERAL:  She is in no apparent distress.  Alert and oriented x4.HEENT:  Atraumatic, normocephalic.  Pupils equal and reactive to light.  Mouth,  no oral lesions.  Posterior pharynx without erythema or exudate.NECK:  Full range of motion.  No lymphadenopathy.LUNGS:  Clear to auscultation  bilaterally.  CARDIOVASCULAR:  Regular rhythm.  No murmur.  Normal S1 and S2.ABDOMEN:  Bowel sounds are present, soft, nondistended, mild tenderness to  palpation in the left lower quadrant and left upper quadrant without  rebound or guarding.EXTREMITIES:  No cyanosis or edema.NEUROLOGIC:  She  has no focal neurologic deficits.  BACK:  Incision well healed.   LABORATORY DATA:  November 2009, white count 9.8, hemoglobin 10.1, and  platelets 829.  INR of 1, BUN 6, creatinine 0.59, albumin 3.1, total  bilirubin 0.1, alk phos 112, AST 19, ALT 17, and lipase 22.  Urine HCG  negative.   ASSESSMENT:  The patient is a 50 year old female who appears to have  overflow incontinence as a reason for her liquid stool.  She likely has  irritable bowel syndrome and constipation predominant.  The differential  diagnosis would include constipation exacerbated by narcotic use and  immobility.  She has no warning signs.  She recently had endoscopy and  abdominal imaging.  Thank you for allowing me to see the patient in  consultation.  My recommendations follow.   RECOMMENDATIONS:  1. She is given discharge instructions in writing.  She is asked to      drink only 6-8 cups of water daily.  She should follow a high-fiber      diet.  He is given a handout on  high-fiber diet.  2. She is to add FiberSure on equivalent 2 times a day and add MiraLax      once a day.  She may use Levsin 0.125 mg sublingual (under her      tongue) as needed for her bowel spasms and cramping, which causes      her to sweat.  I did discuss the side effects, which include      drowsiness, dry eyes, dry mouth, and urinary retention.  3. Will also add imipramine 10 mg 1 p.o. at bedtime for 2 days, then 2      p.o. at bedtime for 2 days, then 3 at bedtime.  She was warned that      it may also cause drowsiness, dry eyes, dry mouth, and urinary      retention.  4. She has a follow up appointment to see me in 6 weeks.  She is also      asked to hold her iron, which can be associated with GI upset.  5. Will also repeat her CBC and if her anemia persists, then we could      consider a reevaluation      of her GI tract in light of the fact that she did require      transfusion during her prior hospitalization.       Kassie Mends, M.D.  Electronically Signed     SM/MEDQ  D:  09/15/2008  T:  09/16/2008  Job:  [id number]   cc:   Melvyn Novas, MD

## 2011-02-20 NOTE — Op Note (Signed)
[name], [name] NO.:  [id number]   MEDICAL RECORD NO.:  [id number]          PATIENT TYPE:  INP   LOCATION:  A302                          FACILITY:  [facility]   PHYSICIAN:  Dalia Heading, M.D.  DATE OF BIRTH:  December 09, 1960   DATE OF PROCEDURE:  09/08/2007  DATE OF DISCHARGE:                               OPERATIVE REPORT   PREOPERATIVE DIAGNOSIS:  Recurrent incisional hernia.   POSTOPERATIVE DIAGNOSIS:  Recurrent incisional hernia, small-bowel  enterotomy.   PROCEDURE:  Recurrent incisional herniorrhaphy with mesh, repair of  small-bowel enterotomy.   SURGEON:  Dr. Franky Macho.   ASSISTANT:  Dr. Tilford Pillar.   ANESTHESIA:  General endotracheal.   INDICATIONS:  The patient is a 50 year old white female status post  multiple abdominal surgeries and repair of an incisional hernias in the  past who now presents for a recurrent incisional hernia repair.  The  risks and benefits of the procedure including bleeding, infection, bowel  injury, and the possibility of an open procedure were fully explained to  the patient who gave informed consent.   PROCEDURE NOTE:  The patient was placed in the supine position.  After  induction of general endotracheal anesthesia, the abdomen was prepped  and draped in the usual sterile technique with Betadine.  Surgical site  confirmation was performed.   The Veress needle was inserted in the epigastric region.  The abdomen  was then insufflated to 16 mmHg pressure.  An 11-mm trocar was  introduced in the left upper quadrant under direct visualization.  As  the trocar were was being introduced into the abdominal cavity, a small-  bowel enterotomy was noted.  It was elected to proceed with an open  procedure.   An incision was made through the previous surgical scar from the  epigastric region down to the umbilicus.  Old polypropylene mesh was  found and this was excised without difficulty.  Peritoneal cavity was  entered  into with moderate difficulty due to multiple adhesions.  On  exploration, multiple small bowel loops were noted along the left side  of the abdomen along the lower portion of the abdomen.  The adhesions  were freed away.  Two small bowel perforations were noted.  These were  less than three-quarters of a centimeter in size.  Both were repaired in  a two-layer fashion using 3-0 silk sutures.  No compromise of the lumen  was noted.  No spillage of small bowel contents was noted.   Given the multiple adhesions in her abdomen, it is strongly suggested  that no further laparoscopic attempts should be performed in the future  for any abdominal surgeries.  The adhesions that were present to the old  mesh repair were freed away.  Next, a 5 x 10 cm Proceed was tacked to  the abdominal wall using 2-0 Novofil interrupted sutures.  The fascia  overlying this was also closed using O PDS sutures.  The subcutaneous  layer was irrigated with normal saline and the skin was closed using  skin staples.  Of note was that the abdominal cavity was  copiously  irrigated with normal saline prior to the hernia repair.  0.5 cm  Sensorcaine was instilled into the surrounding wounds.  Betadine  ointment and dry sterile dressings were applied.   All tape and needle counts were correct at the end of the procedure.  The patient was extubated in the operating room and went back to the  recovery room awake and in stable condition.   COMPLICATIONS:  Small-bowel enterotomy.   BLOOD LOSS:  Minimal.   SPECIMEN:  None      Dalia Heading, M.D.  Electronically Signed     MAJ/MEDQ  D:  09/08/2007  T:  09/08/2007  Job:  [id number]   cc:   Dalia Heading, M.D.  Fax: [phone]   Delbert Harness, MD

## 2011-02-20 NOTE — Consult Note (Signed)
[name], [name] NO.:  [id number]   MEDICAL RECORD NO.:  [id number]          PATIENT TYPE:  INP   LOCATION:  5003                         FACILITY:  [facility]   PHYSICIAN:  Anselmo Rod, M.D.  DATE OF BIRTH:  14-Apr-1961   DATE OF CONSULTATION:  10/03/2008  DATE OF DISCHARGE:                                 CONSULTATION   REASON FOR CONSULTATION:  Acute abdominal pain x 3 days.   ASSESSMENT:  1. Abdominal pain of unclear etiology, pain predominantly in the right      upper quadrant as per the patient.  Abdominal ultrasound and CT are      unrevealing.  2. History of chronic constipation (constipation predominant irritable      bowel syndrome).  3. Gastroesophageal reflux disease on proton pump inhibitors.  4. Asthma.  5. Chronic back pain.  The patient had back surgery with L4-L5 fusion      about 6 weeks ago.  6. Bilateral neck fusion in the past.  7. Ventral hernia repair in the past.  8. Status post appendectomy, complicated ? bowel obstruction.  9. Arthritis.  10.Multiple drug allergies including PENICILLIN, CODEINE, KETOROLAC,      MOTRIN, VIOXX, ASPIRIN, AVELOX, MORPHINE.   RECOMMENDATIONS:  1. MiraLax 2-3 times per day, start Align 4 mg per day.  2. Continue PPIs.  3. Decrease the usage of narcotics as this may worsen her      constipation.  4. Encourage ambulation.   DISCUSSION:  [name] is a 50 year old white female admitted to  the [facility] with complaints of severe right upper quadrant  pain that started about 2-3 days ago.  The patient tells me the pain is  severe with intensive 10/10.  It is associated with mild nausea, but no  chills or rigors at this time according to the dictation by the  [facility] hospitalist.  The patient had some chills when she first  presented.  She claims she has had diverticulitis in the past, but I  could not find evidence on previous CT.  There is no history of melena  or hematochezia.   Appetite is fair.  Weight has been stable.  The  patient was found to have abnormal LFTs and, therefore, was admitted for  observation.  An abdominal ultrasound and a CT scan of the abdomen have  been unrevealing.  The patient claims she has 1 bowel movement every 3-4  days, and then takes a laxative, and upon doing so she has 2-3 loose  bowel movements per day followed by another cycle of constipation.  This  has been the pattern of bowel habits for most of her adult life.  There  is no history of unusual weight loss or weight gain.  The patient denies  family history of stomach or colon cancer.  She is adopted and,  therefore, is not sure of her family history in her biological parents.  The patient denies any sick contacts or foreign travel.   PAST MEDICAL HISTORY:  See list above.   ALLERGIES:  1. PENICILLIN causes anaphylaxis.  2.  CODEINE KETOROLAC, MOTRIN all of which causes itches and hives.  3. VIOXX causes nausea.  4. ASPIRIN causes GI upset.  5. AVELOX causes hives.  6. MORPHINE causes itching.   SOCIAL HISTORY:  She is disabled for several years now.  She denies the  use of alcohol, tobacco, or drugs.   FAMILY HISTORY:  The patient is adopted and, therefore, not aware of her  family history.   PHYSICAL EXAMINATION:  GENERAL:  The patient is a middle-aged white  female lying in bed.  The patient seems very comfortable.  VITAL SIGNS:  Temperature is 97.5.  Blood pressure 106/67.  Pulse of 70  per minute.  Respiratory rate of 14.  HEENT:  Reveals atraumatic/normocephalic head.  The patient's facial  symmetry is preserved.  NECK:  Supple with surgical scars present at the base of the neck  bilaterally.  CHEST:  Clear to auscultation, S1 and S2 regular.  No rales.  No  wheezes.  .  ABDOMEN:  Soft with surgical scars present in the midline below the  umbilicus from a previous appendectomy and question bowel obstruction.  There is some tenderness in the right upper  quadrant on deep palpation.  The patient's reaction seems to be out of proportion to the  abnormalities listed on physical exam.  The patient has severe pain on  palpation with significant guarding which just seems somewhat  inappropriate as she seems otherwise comfortable lying in bed.  There is  no rebound or rigidity.  RECTAL:  Deferred.   LABORATORY DATA:  Reveals a white count of 5200, hemoglobin 10.9,  platelets 282, amylase of 60, lipase 23.  Acute hepatitis panel is  negative for hepatitis A, B, and C.  CMP done revealed a sodium of 142,  potassium 4.1, chloride 103, CO2 30, glucose 96, BUN 7, creatinine 153,  alkaline phosphatase 99, AST 161, ALT 218, total protein 5.7, albumin  2.4, calcium 9.2, magnesium 2.5, phosphorus 5.4.   PLAN:  As above with recommendations made and followup with Dr. Yancey Flemings.  She will follow up with [facility] tomorrow.      Anselmo Rod, M.D.  Electronically Signed     JNM/MEDQ  D:  10/03/2008  T:  10/03/2008  Job:  [id number]   cc:   Wilhemina Bonito. Marina Goodell, MD

## 2011-02-20 NOTE — Op Note (Signed)
NAMEJERYN, BERTONI               ACCOUNT NO.:  [id number]   MEDICAL RECORD NO.:  [id number]          PATIENT TYPE:  INP   LOCATION:  5019                         FACILITY:  [facility]   PHYSICIAN:  Kerrin Champagne, M.D.   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   PROCEDURES:  1. Removal of cervical cages C5-C6 and C6-C7 with corpectomy of the      residual C6 vertebral body.  2. Decompression bilateral foramen at the C6 and C7 levels.  3. Anterior fusion C5-C7 with allograft fibular strut graft and local      bone graft with OPL 1.  4. Trestle plate internal fixation from C5-C7 of 32 mm in length with      12-mm screws.  A microscope was used during the procedure.   SURGEON:  Kerrin Champagne, MD.   ASSISTANTGloris Manchester. Lazarus Salines, MD assisted.  The other assistant during  the case was Wende Neighbors, PA-C.   ANESTHESIA:  General via orotracheal intubation, Dr. Gelene Mink.   FINDINGS:  1. Biforaminal stenosis to C5-C6 level affecting the C6 nerve roots.  2. Foraminal stenosis right side C6-C7 affecting the right C7 nerve      root.  3. 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   ESTIMATED BLOOD LOSS:  150-200 mL.   COMPLICATIONS:  None.   The patient returned to the PACU in good condition.   HISTORY OF PRESENT ILLNESS:  This patient is a 50 year old female who  underwent an anterior cervical  diskectomy and fusion at 2 levels nearly  2-1/2 to 3 years ago in [place] and since then has had persistent neck  pain and radiation into her shoulders and arms. She underwent attempts  at conservative management including pain management without significant  relief of pain.  She was seen for evaluation this year.  Plain  radiographs demonstrating motion across the C5-C6 and C6-C7 spinous  process consistent with nonunion.  She had a retrolisthesis of C5 and C6  and persistent foraminal stenosis on MRI studies.  She has propensity to  take pain medications unfortunately but nonetheless with findings of  persistent entrapment of her nerve roots and nonunions of 2 segments  felt that surgery was warranted in regards to rearthrodesing the  anterior aspect of the cervical spine at C5-C6 and C6-C7 and performing  decompression of the foramen at these levels.   INTRAOPERATIVE FINDINGS:  The patient underwent surgery and was found to  have foraminal entrapment at bilateral C5-C6 and the right  side of C6-  C7.  Loose grafts at C5-C6 and C6-C7 loose cages.   DESCRIPTION OF PROCEDURE:  After adequate general anesthesia, the  patient had Foley catheter placed in beach chair position and  the neck  in slight extension with a Mayfield horseshoe with a cervical Halter  traction.  All pressure points well padded with standard TED hose and  PAS stockings.  Standard prep with  DuraPrep solution over the anterior  neck and upper chest.  Draped in the usual manner.  Iodine Vi-Drape was  used.  The patient had previous incisions of surgery on the right side  of C5-C6 and C6-C7.   The patient had previous surgery in [place] and because of her  previous surgery it was felt that it was important that exposure be  careful with ENT present.  Dr. Flo Shanks assisted on the initial  exposure of the cervical spine of  left side.  The incision at the level  of the carotid tuberosity, carotid tubercle, and  in line with the  cricoid thyroid approximately 5-6 cm in length tranverse in line with  the patient's skin incision.  Through the skin of subcutaneous layers,  platysma was identified and incised in line with the skin incision.  Interval between the trachea and esophagus medially, carotid sheath  laterally was then developed.  Blunt dissection to the anterior aspect  of the cervical spine palpated the carotid sheath during this portion  and the previous cervical plate was identified.  Incision on to the  plate with a #16-XWRUE scalpel and then the prevertebral soft tissue  carefully removed off the anterior aspect of the plate of cervical spine  using key elevator and #15-blade scalpel.   The patient had removal of plate and screws without difficulty using the  aforementioned screwdrivers. The plate easily lifted away actually with  the remaining screw after loosening on the lower right hand side  attached to the blade.  Next, the fusion sites were identified and  showed motion across both the  C5-C6 and C6-C7 levels.   The patient's anterior aspect of the disk space were carefully debrided  using curettage in the interval between the bony surfaces.  The cages  were then carefully developed first with a #15-blade scalpel then the  quarter-inch of straight osteotome, then the quarter-inch curved  osteotome used to carefully lift the cages and to bring them forward.  Cages then were eventually able to be grasped with a Kocher clamp and  removed to both C5-C6 and C6-C7.  Distraction clamp using 14-mm screw  post were then place screw posts into the vertebral body of C5 and C7 in  order to maintain this distance throughout the remainder of the  procedure.  There was very little bone remaining between the two cages  perhaps 5-6 mm at most and then this was removed using a Leksell rongeur  centrally.  High-speed bur was then used to carefully thin the inferior  portion of the vertebral body  of C5 and the residual portions of the  inferior endplate using high-speed bur of superior aspect of C7.  The  vertebrectomy was then carried and expanded centrally to nearly 16 mm  with carried back to the cortex of the vertebral body of C6.  The disk  space at the C6-C7 level was then carefully examined and determined  using a curettage removing fibrous tissue from the disk space back to  the posterior lips of the C6-C7 level.  Then  1-mm and 2-mm Kerrison's  were then able to be used to remove posterior lip osteophytes at this  level.  Performed foraminotomy over the C7 nerve root on the right side  and left side.  The posterior shell of the C6 vertebral body was left  intact above this level and then at the C5-C6 level.  Again, the  posterior aspect of disk space was identified using curettage and then 1-  mm Kerrison was used to resect both the posterior superior aspect of C6  and posterior inferior aspect of C5 in order to decompress the canal at  this level, then performed foraminotomies of the right neural foramen of  C5 and left side as well decompressing the C6 nerve roots here.  When  this was completed, then a portion of fibular allograft bone graft was  obtained.  It was made just slightly longer than the length the end of  C5 to the top of C7 and rounded over both ends.  The patient's local  bone graft was then packed into the central portions of the fibular  graft and then the graft and placed over the intervertebral disk space  and  then packed into place.  Intraoperative radiographs demonstrated  the graft in good position and alignment and then following the  application of plates and screws and placement of screws in the  vertebral body of C5 into C7, this was done and provided excellent  fixation.  Additional second radiograph then demonstrated the screws in  good position and alignment without evidence of retro portion of bone  graft material or of screws posterior to  the endplates and posterior  vertebral margin.  Irrigation was then carried out.  Note that at the  beginning of the case, Dr. Zola Button T. Wolicki, assisted with both the  exposure as well as retraction during this procedure.  Wende Neighbors,  then assisted with further decompression.  Operating room microscope was  used during the case.  It was brought in for the resection of the  posterior aspects of the vertebral body of C6 at the area of the disk  spaces as well as foraminotomies.  It was used sterilely.  Following  this, then a 10-French drain was placed to the depth of the incision and  sewn into the skin in the area below the incision site.  Irrigation was  carried out.  FloSeal and Thrombin-soaked Gelfoam as well as bone wax  was used throughout the case in order to maintain hemostasis.  When  there was no further active bleeding present and note that OPL1 was  mixed with local bone graft also placed in the gutters along the area of  the graft centrally.  When no further bleeding was present, then the  drain in place, the platysma layer was reapproximated with interrupted 2-  0 Vicryl sutures, subcutaneous layers approximated with interrupted 3-0  Vicryl sutures and the skin closed with  a running subcutaneous stitch  of 4-0 Vicryl.  The skin then closed with Dermabond.  The patient had  [place] collar applied.  The patient was then reactivated,  extubated, and returned to recovery room in satisfactory condition.  All  instruments and sponge counts were correct.      Kerrin Champagne, M.D.  Electronically Signed     JEN/MEDQ  D:  04/30/2008  T:  05/01/2008  Job:  [id number]

## 2011-02-20 NOTE — Discharge Summary (Signed)
NAME:  [name], [name] NO.:  [id number]   MEDICAL RECORD NO.:  [id number]          PATIENT TYPE:  INP   LOCATION:  A302                          FACILITY:  [facility]   PHYSICIAN:  Dalia Heading, M.D.  DATE OF BIRTH:  August 10, 1961   DATE OF ADMISSION:  09/08/2007  DATE OF DISCHARGE:  12/03/2008LH                               DISCHARGE SUMMARY   HOSPITAL COURSE SUMMARY:  The patient is a 50 year old white female who  has had multiple abdominal surgeries in the past who presented with an  incisional hernia.  She presented to the operating room on September 08, 2007.  Initially, I attempted a laparoscopic approach, but the abdomen  could not be entered into, and a small-bowel enterotomy was found.  I  then proceeded with an incisional herniorrhaphy with mesh and repair of  small-bowel enterotomy.  There was no spillage of bowel contents during  the procedure.  The patient tolerated the procedure well.  Postoperative  course was remarkable for some anxiety and nausea which have since  resolved.  Pain control was also an issue, but this has improved.  The  patient is being discharged home on postoperative day #2 in good and  improving condition.   DISCHARGE INSTRUCTIONS:  The patient is to follow up with Dr. Franky Macho on September 18, 2007.   DISCHARGE MEDICATIONS:  1. Aciphex 20 mg p.o. daily.  2. Valium 5 mg p.o. 4 times a day.  3. Premarin 1 tablet p.o. daily.  4. Neurontin 100 mg p.o. 4 times a day.  5. Percocet 325/7.5 mg 1-2 tablets p.o. q. 4 h p.r.n. pain.  6. Phenergan 25 mg p.o. q. 6 h p.r.n. nausea.  7. Albuterol inhaler p.r.n.  8. Claritin D 1 tablet p.o. daily.  9. Reglan as needed for nausea.   PRINCIPAL DIAGNOSES:  1. Incisional hernia.  2. Small bowel enterotomy.  3. Chronic obstructive pulmonary disease.  4. Anxiety disorder.   PRINCIPAL PROCEDURE:  Incisional herniorrhaphy with mesh, repair of  small-bowel enterotomy on September 08, 2007.      Dalia Heading, M.D.  Electronically Signed     MAJ/MEDQ  D:  09/10/2007  T:  09/10/2007  Job:  [id number]   cc:   Melvyn Novas, MD  Fax: [phone]

## 2011-02-20 NOTE — Discharge Summary (Signed)
[name], WEVER NO.:  [id number]   MEDICAL RECORD NO.:  [id number]          PATIENT TYPE:  INP   LOCATION:  5003                         FACILITY:  [facility]   PHYSICIAN:  Monte Fantasia, MD  DATE OF BIRTH:  [birth year]   DATE OF ADMISSION:  10/02/2008  DATE OF DISCHARGE:                               DISCHARGE SUMMARY   DISCHARGE DIAGNOSES:  1. Functional abdominal pain.  2. Irritable bowel syndrome.  3. Transaminitis.  4. Gastroesophageal reflux disease.  5. Asthma.  6. Anemia.   MEDICATIONS:  1. Albuterol 2 puffs inhalation q.6 h. p.r.n. breathlessness.  2. Xanax 0.5 mg p.o. q.6 h.  3. Dicyclomine 10 mg p.o. t.i.d.  4. Neurontin 400 mg p.o. at bedtime.  5. Claritin 10 mg p.o. daily.  6. Protonix 40 mg p.o. q.12 h.  7. MiraLax 17 g p.o. at bedtime.  8. Florastor 250 mg p.o. daily.   COURSE DURING THE HOSPITAL STAY:  This is a 50 year old Caucasian lady  patient was admitted on October 02, 2008, with complaints of severe  right upper quadrant pain which was 10/10 in intensity with nausea and  chills.  The patient was admitted for evaluation and was found to have  high liver function test, AST, and ALT on admission.  One of the  medication for ADHD was on hold due to possible liver toxicity.  The  patient underwent the CT of the abdomen and pelvis on admission which  showed no acute abnormality.  Ultrasound of the abdomen also was normal.  The patient was, however, still persistently complained of the pain in  the abdomen and GI evaluation was asked for.  As per the recommendation,  she was started on dicyclomine and Levsin for her possible irritable  bowel syndrome and status post multiple abdominal surgeries.  The  patient at present still complains of pain in the abdomen, however, the  episodes of spasm have much decreased compared to those on admission.  We will repeat labs in the morning for the liver enzymes.  If the  patient has no more  episodes of pain, can be planned for discharge in  a.m.   RADIOLOGICAL INVESTIGATIONS:  Radiological investigations done during  the stay of the hospital.  1. Abdominal x-ray done on admission October 02, 2008, impression,      non-specific bowel gas pattern, bilateral lung scarring probably      atelectasis, postoperative changes.  2. CT of the abdomen done on October 02, 2008, impression, benign-      appearing abdomen.  3. CT of the pelvis, impression, no acute abnormality, moderate amount      of stools throughout the colon.  4. Ultrasound of the abdomen October 02, 2008, impression was a      normal exam.  5. Abdominal x-ray done on October 04, 2008, showed no evidence of      any acute abnormality.   LABORATORY DATA:  Labs done during the stay in the hospital, total WBC  5.2, hemoglobin 10.9, hematocrit 33.5, platelets of 282.  Sodium 141,  potassium 4.5, chloride 104, bicarb 30,  glucose 109, BUN 11, creatinine  0.6, total bilirubin 0.6, alkaline phosphatase 111, AST 88, ALT 174,  total protein 6.6, albumin 3.9, calcium 9.9, and TSH 1.629.  Anemia  panel; total iron 134, TIBC 368, present saturation is 36%, folate is  982, ferritin is 56.  Hepatitis B surface antigen, negative.  Hepatitis  B core antibody, negative.  Hepatitis A antibody, negative.  Hepatitis C  antibody, negative.  Urinalysis has been negative.  Fecal occult blood  has been negative.  Urine cultures have no growth to date.   DISPOSITION:  The patient still complains of pain in the abdomen,  however, the frequency and intensity is decreased.  If the patient  improves symptomatically, then stable to be discharged and can be  discharged home.      Monte Fantasia, MD  Electronically Signed     MP/MEDQ  D:  10/05/2008  T:  10/06/2008  Job:  [id number]

## 2011-02-20 NOTE — Discharge Summary (Signed)
NAMESHERYN, ALDAZ NO.:  [id number]   MEDICAL RECORD NO.:  [id number]          PATIENT TYPE:  INP   LOCATION:  5128                         FACILITY:  [facility]   PHYSICIAN:  Kerrin Champagne, M.D.   DATE OF BIRTH:  11/01/1960   DATE OF ADMISSION:  04/30/2008  DATE OF DISCHARGE:  05/03/2008                               DISCHARGE SUMMARY   ADMISSION DIAGNOSES:  1. Nonunion anterior cervical diskectomy and fusion site C5-C6 and C6-      C7 with cervical cage fusion in each segment persistent by      foraminal stenosis at C5-C6 and C6-C7.  2. Gastroesophageal reflux disease.  3. Migraine headaches.  4. History of asthma.  5. History of bowel obstruction.  6. History of peptic ulcer disease.  7. Degenerative disk disease.   DISCHARGE DIAGNOSES:  1. Nonunion anterior cervical diskectomy and fusion site C5-C6 and C6-      C7 with cervical cage fusion in each segment persistent by      foraminal stenosis at C5-C6 and C6-C7.  2. Gastroesophageal reflux disease.  3. Migraine headaches.  4. History of asthma.  5. History of bowel obstruction.  6. History of peptic ulcer disease.  7. Degenerative disk disease.   PROCEDURE:  On April 30, 2008, the patient underwent:  1. Removal of cervical cages C5-C6 and C6-C7 with corpectomy of the      residual C6 vertebral body.  2. Decompression bilateral foramen at the C6 and C7 levels.  3. Anterior fusion C5-C7 with allograft fibular strut graft and local      bone graft with OPI I.  4. Trestle plate internal fixation from C5-C7 of 32-mm in length with      12-mm screws.  Microscope used during the procedure.  This was      performed by Dr. Otelia Sergeant assisted by Dr. Lazarus Salines and Maud Deed      [facility] under general anesthesia.   CONSULTATIONS:  None.   BRIEF HISTORY:  Ms. Veracruz is a 50 year old female status post anterior  cervical diskectomy and fusion nearly 3 years ago at C5-C6 and C6-C7.  She has had persistent neck pain  with radiation into her shoulders  bilaterally.  She has failed conservative treatment including pain  management.  Plain radiographs demonstrated motion across the C5-C6 and  C6-C7 spinous process consistent with nonunion.  On MRI studies,  retrolisthesis of C5 on C6 and persistent foraminal stenosis was noted.  It was felt that she would benefit from surgical intervention and was  admitted for the procedure as stated above.   BRIEF HOSPITAL COURSE:  The patient tolerated the procedure under  general anesthesia without complications.  Postoperatively,  neurovascular motor function of the upper extremities were intact.  On  the first postoperative day, the patient's drain was removed from her  wound and her dressing was changed.  The patient tolerated PCA  analgesics initially.  She did have itching from morphine and her PCA  was changed to Dilaudid.  She was then weaned p.o. analgesics without  difficulties.  The patient developed muscle  spasms in her neck and  shoulder area and was treated with Robaxin.  She had some mild nausea,  which resolved.  On her third postoperative day, she was able to  ambulate in the hallway.  She was tolerating a regular diet without any  swallowing difficulties and her wound was healing well.  The patient was  afebrile with neurovascular motor function of the upper extremities  intact.  She was discharged to her home in stable condition.   PERTINENT LABORATORY VALUES:  Admission labs within normal limits.  H  and H postoperatively 10.2 and 30.0 respectively.  Coagulation studies,  routine chemistry studies, and urinalysis were all within normal limits.  EKG on admission, normal sinus rhythm.   PLAN:  The patient was discharged to her home.  She was advised to keep  her incision dry and clean for 4 days and at the fifth postoperative  day, if she has no wound drainage, she can shower utilizing her  [place] collar.  Otherwise, she should wear her  Aspen collar at all  times.  She will avoid overhead lifting.  No range of motion of the  cervical spine.  She is encouraged to continue a soft diet if she has  sore throats.  No lifting.  No driving.  She will follow up with Dr.  Otelia Sergeant 2 weeks from surgery.   MEDICATIONS AT DISCHARGE:  1. Percocet 5/325 one to two every 4 to 6 hours as needed for pain.  2. Robaxin 500 mg one every 8 hours as needed for spasm.  She was      advised to discontinue the use of Soma or Flexeril while utilizing      her Robaxin.  All other home medications will be resumed and the      patient was given a medication reconciliation form with these      instructions.   CONDITION ON DISCHARGE:  Stable.      Wende Neighbors, P.A.      Kerrin Champagne, M.D.  Electronically Signed    SMV/MEDQ  D:  06/17/2008  T:  06/17/2008  Job:  [id number]

## 2011-02-20 NOTE — H&P (Signed)
[name], PUEBLA NO.:  [id number]   MEDICAL RECORD NO.:  [id number]          PATIENT TYPE:  INP   LOCATION:  5003                         FACILITY:  [facility]   PHYSICIAN:  Joylene John, MD       DATE OF BIRTH:  1961/06/18   DATE OF ADMISSION:  10/02/2008  DATE OF DISCHARGE:                              HISTORY & PHYSICAL   REASON FOR ADMISSION:  Severe abdominal pain.   HISTORY OF PRESENT ILLNESS:  This is a 50 year old white female coming  in with sharp, severe right upper quadrant abdominal pain which started  yesterday according to the patient.  The patient tells me that the pain  is 10/10 in severity associated with nausea and chills.  No vomiting.  The patient tells me that the pain is similar to the episode of  diverticulitis that she had approximately 4 years ago.  She denies any  recent travels or sick contacts.  Her over-the-counter pills only  include vitamins and calcium.  The patient has been having bowel  movements daily.  She has extensive history of abdominal surgery in the  past and has been diagnosed with IBS.  In the ER, her LFTs were found to  be elevated and the patient was admitted to [facility] for further  management.   PAST MEDICAL HISTORY:  1. IBS.  2. GERD.  3. Asthma.  4. 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  5. Back surgery.  6. Also surgery for appendectomy which developed and resulted into      small bowel obstruction and required abdominal lysis.  7. Hysterectomy.   SOCIAL HISTORY:  She is disabled.  Denies any tobacco or alcohol.  Single, has one child.   FAMILY HISTORY:  Noncontributory.  She was adopted.   ALLERGIES:  1. PENICILLIN which causes anaphylaxis.  2. CODEINE which causes itching.  3. KETOROLAC, which is TORADOL which causes itching as well.  4. MOTRIN which causes hives.  5. VIOXX which causes nausea, vomiting, and diarrhea.  6. ASPIRIN which causes GI upset.  7. AVELOX which causes hives.  8. MORPHINE which causes  itching.   REVIEW OF SYSTEMS:  Positive for abdominal pain and nausea.  Otherwise,  a 14-point review of system is negative.   PHYSICAL EXAMINATION:  VITAL SIGNS:  Temperature is 98.2, pulse 88,  respirations 20, blood pressure 111/70, and O2 saturation 92% on room  air.  GENERAL:  The patient is in no acute distress.  She is awake and alert,  talking in full sentences.  HEENT:  No icterus or pallor appreciated.  NECK:  No JVD appreciated.  LUNGS:  Clear to auscultation.  CARDIOVASCULAR:  Regular rate and rhythm.  ABDOMEN;  She does have right upper quadrant tenderness and some  fullness with rebound.  No guarding appreciated.  Increased bowel sounds  noted.  LOWER EXTREMITIES:  No edema appreciated.   PERTINENT LABORATORY DATA:  Sodium 141, potassium 3.1, chloride 110,  bicarb 24, BUN 13, creatinine 0.52, glucose 76, calcium 8.8, and albumin  3.6.  AST is increased to 220, ALT 211, lipase is 23, and  amylase is 58.  Urinalysis negative for UTI.  T-bili is 0.3.  Hemoglobin 11.1, crit  34.5, white count 5.7, and platelets 286.   ASSESSMENT AND PLAN:  This is a 50 year old female coming with severe  abdominal pain, only laboratory abnormality noted is increased AST and  ALT.  The patient did have CT and ultrasound in the ER which did not  show any obstruction or gallstones and there was no bile duct  dilatation.  However, there was extensive stool that was noted.  1. We will give MiraLax for constipation.  2. There supposed to have been a stone which has past causing      increased LFT and abdominal pain.  However, there is no evidence of      residual stones on imaging.  We will monitor the patient and repeat      labs in the morning.  3. We will give Dilaudid for pain.  4. We will put the patient on liquid diet and advance diet as      tolerated.  If LFTs continue to be raised, then other labs such as      hep serologies can be considered.  Otherwise, once pain is       controlled and labs look okay, she can be discharged with      outpatient follow up.      Joylene John, MD  Electronically Signed     RP/MEDQ  D:  10/02/2008  T:  10/03/2008  Job:  [id number]

## 2011-02-20 NOTE — Op Note (Signed)
[name], [name]               ACCOUNT NO.:  [id number]   MEDICAL RECORD NO.:  [id number]          PATIENT TYPE:  AMB   LOCATION:  DSC                          FACILITY:  [facility]   PHYSICIAN:  Cindee Salt, M.D.       DATE OF BIRTH:  [birth year]   DATE OF PROCEDURE:  10/21/2008  DATE OF DISCHARGE:                               OPERATIVE REPORT   PREOPERATIVE DIAGNOSIS:  Malunion, right little finger metacarpal.   POSTOPERATIVE DIAGNOSIS:  Malunion, right little finger metacarpal.   OPERATION:  Rotational extension osteotomy, right little finger  metacarpal.   SURGEON:  Cindee Salt, MD   ASSISTANTCarolyne Fiscal, RN   ANESTHESIA:  General with local supplementation.   ANESTHESIOLOGIST:  Zenon Mayo, MD   HISTORY:  The patient is a 50 year old female with a history of a  fracture of the metacarpal, right hand.  She has had this healed in a  rotated position with underlap to the ring finger.  She is desirous of  having this released.  She shows a significant flexion contracture to  the PIP joint, extension contracture to the metacarpophalangeal joint.  In addition, this has not responded to conservative treatment for  mobilization of the finger joints.  She is desirous of attempted  reconstruction.  She is aware of the possibility of nonunion, infection,  injury to arteries, nerves, and tendons, incomplete relief of symptoms,  some continuation of the rotatory deformity, but has elected to proceed  to have this treated.  In the preoperative area, the patient is seen.  The extremity is marked by both the patient and surgeon.  Antibiotic is  given.   PROCEDURE:  The patient was brought to the operating room and general  anesthetic was carried out without difficulty under the direction of Dr.  Sampson Goon.  She was prepped using DuraPrep, supine position, right arm  free.  A time-out was taken.  The limb was exsanguinated with an Esmarch  bandage.  Tourniquet placed on the forearm  was inflated to 250 mmHg.  The old incision was used and carried down through the subcutaneous  tissue.  Bleeders were electrocauterized.  Dorsal sensory nerve was  identified and protected.  Significant scarring was present about the  extensor tendons.  A tenolysis was performed.  The periosteum was  incised.  The malunion was immediately identified.  This was confirmed  at its position with [facility] [employer] image intensification.  A 4-hole 2.4-mm modular  handset plate was then placed.  The position was checked.  This was then  fixed in the most distal screw on the flat aspect of the metacarpal,  drilled with a 1.8-mm drill, a 11-mm screw was inserted and this was  removed after checking position with [facility] image intensification.  The  osteotomy site was marked so as to intersect the central aspect of the  plate and this was directly over the area of the malunion.  A small  oscillating saw was then used to osteotomize the metacarpal transversely  to allow extension in addition to rotation to correct both deformities  at once.  The  plate was then reattached, fixed with the second screw  which was 9 mm after drilling with a 1.5-mm drill.  The fracture  osteotomy site was then rotated to extend it.  The fingers were placed  in full flexion to maintain rotation.  The drill holes were marked.  Then, two proximal screws were applied.  These were 8 and 10 mm.  These  were first drilled with a 1.5-mm drill and fixed.  The finger was placed  through its much mobility as possible with no rotational deformity being  present.  X-rays confirmed the positioning of the fracture.  The wound  was copiously irrigated with saline.  The periosteum was then closed  with figure-of-eight 4-0 Vicryl sutures, the subcutaneous tissue with  interrupted 4-0 Vicryl, and the skin with interrupted 5-0 Vicryl Rapide  sutures.  Sterile compressive dressing and splint was applied.  X-rays  at the end of the case revealed the plate and  the position of the  metacarpal in good position.  On deflation of tourniquet, all fingers  immediately pinked.  She was taken to the recovery room for observation  in satisfactory condition.  She would be discharged home to return the  [facility] [place] in 1 week on [place]win NX prior to closure of the  wound.  The wound was infiltrated with 0.25% Marcaine without  epinephrine, 3 mL was used.  The patient tolerated the procedure well.  She would return in 1 week.           ______________________________  Cindee Salt, M.D.     GK/MEDQ  D:  10/21/2008  T:  10/21/2008  Job:  [id number]   cc:   Delbert Harness, MD
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
## 2011-02-23 NOTE — Op Note (Signed)
NAME:  [name], [name]                         ACCOUNT NO.:  [id number]   MEDICAL RECORD NO.:  [id number]                   PATIENT TYPE:  AMB   LOCATION:  ENDO                                 FACILITY:  [facility]   PHYSICIAN:  John C. Madilyn Fireman, M.D.                 DATE OF BIRTH:  [birth year]   DATE OF PROCEDURE:  07/14/2002  DATE OF DISCHARGE:                                 OPERATIVE REPORT   PROCEDURE:  Flexible sigmoidoscopy.   INDICATIONS FOR PROCEDURE:  The patient seen for the first time with  abdominal pain and constipation with several phone calls relating that she  was seeing pus in her stools with a small amount of blood and no response  to Levbid. She basically insisted on a colonoscopy when calling in distress  yesterday. She had a CT scan of the abdomen done about a month ago which was  negative.   DESCRIPTION OF PROCEDURE:  The patient was placed in the left lateral  decubitus position then placed on the pulse monitor with continuous low flow  oxygen delivered by nasal cannula. She was sedated with 100 mg IV Demerol  and 10 mg IV Versed. The Olympus video colonoscope was inserted into the  rectum and after visualizing about the first 6-8 cm, there was solid stool  above which around the proximal rectum there was another area of clear  mucosa followed by solid stool of the rectosigmoid junction that I could not  traverse or see around. Therefore it did not appear likely I would be able  to image her proximal colon. She was also difficult to sedate and despite  the amount of medication given was exhibiting significant pain responses  even with minimal pressure of the colonoscopy around the rectosigmoid area.  The scope was then withdrawn and the patient returned to the recovery room  in stable condition. The patient tolerated the procedure reasonably well and  there were no immediate complications.   IMPRESSION:  Normal limited flexible sigmoidoscopy with stool in the  rectum  and rectosigmoid precluding a full colonoscopy.   PLAN:  Remain on clear liquid diet another day to see if she clears from  CoLyte prep which she claims to have drank the day before the procedure and  had absolutely no results from. Will also recommend that she try Dulcolax  suppositories or Fleets enemas until she clears her bowel. I suspect  significant constipation is causing the majority of her pain. Will obtain a  KUB today to assess stool pattern.                                                John C. Madilyn Fireman, M.D.    JCH/MEDQ  D:  07/14/2002  T:  07/15/2002  Job:  [id number]   cc:   Ollen Gross. Vernell Morgans, M.D.  Fax: [phone]

## 2011-02-23 NOTE — Discharge Summary (Signed)
[facility]  Patient:    [name], [name]                      MRN: [id number] Adm. Date:  [id number] Disc. Date: [id number] Attending:  Minette Headland Dictator:   Danie Chandler, R.N.                           Discharge Summary  ADMITTING DIAGNOSES:          1. Intrauterine pregnancy at 29 weeks with                                  preterm contractions.                               2. 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                               3. Positive group B beta strep.  DISCHARGE DIAGNOSES:          1. Intrauterine pregnancy at 29 weeks with                                  preterm contractions.                               2. 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                               3. Positive group B beta strep.  PROCEDURE:                    Magnesium sulfate tocolysis.  REASON FOR ADMISSION:         The patient is a 50 year old gravida 3 para 1 white female with an estimated date of confinement of April 25, 2000, placing her at approximately 29 weeks estimated gestational age.  Her prenatal care has been complicated by preterm labor.  She has been at home on Procardia and terbutaline.  She was sent to triage on Feb 06, 2000 for increased contractions.  In triage, regular uterine activity was noted, and she was admitted for magnesium sulfate tocolysis.  Her prenatal care has been complicated by:  1. Positive group B beta strep.  2. Carrier of hepatitis C. 3. Tobacco usage.  HOSPITAL COURSE:              The patient was admitted, received magnesium sulfate, betamethasone, and IV Unasyn.  On hospital day #1, the patient remained on magnesium sulfate.  She had an ultrasound performed which showed estimated fetal weight at less than 10%.  She was continued on magnesium sulfate, and on hospital day #2 she was feeling no contractions.  Her vital signs were stable.  Her magnesium sulfate was decreased and she was again on a terbutaline pump.  On hospital  day #3, the patient was having a rare uterine contraction.  Fetal heart rate was reactive.  She was stable on the terbutaline pump and her magnesium sulfate was discontinued on this day.  The plan was  also made to follow serial ultrasounds and monitor fetus due to small for gestational age.  The patient was discharged home on Feb 10, 2000 with no complaints.  She was having minimal contractions.  The fetal heart rate was okay and terbutaline pump was in place.  CONDITION ON DISCHARGE:       Stable.  FOLLOW-UP:                    She is to follow up in the office this week for ultrasound.  She is to call for any signs or symptoms of preterm labor.  DISCHARGE MEDICATIONS:        1. Prenatal vitamin 1 p.o. q.d.                               2. Ceftin as directed by M.D.                               3. Vicodin #60 1-2 p.o. q.4h. p.r.n. pain.  ACTIVITY:                     She is to be on bedrest with bathroom privileges only. DD:  04/15/00 TD:  04/15/00 Job: [id number]
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
## 2011-02-23 NOTE — Op Note (Signed)
NAME:  [name], [name]                         ACCOUNT NO.:  [id number]   MEDICAL RECORD NO.:  [id number]                   PATIENT TYPE:  AMB   LOCATION:  SDC                                  FACILITY:  [facility]   PHYSICIAN:  Naima A. Dillard, M.D.              DATE OF BIRTH:  1960-10-27   DATE OF PROCEDURE:  DATE OF DISCHARGE:                                 OPERATIVE REPORT   PREOPERATIVE DIAGNOSIS:  Chronic pelvic pain with bilateral ovarian cysts.   POSTOPERATIVE DIAGNOSES:  1. Chronic pelvic pain.  2. Pelvic adhesions.  3. Right ovarian simple cyst.  4. Normal ovaries bilaterally.  5. Normal fallopian tubes bilaterally.  6. Right paratubal cyst.  7. Pelvic pseudocyst.   PROCEDURES:  1. Diagnostic laparoscopy.  2. Lysis of adhesions.  3. Removal of pelvic pseudocyst.  4. Removal or right ovarian cyst and paratubal cyst.   SURGEON:  Naima A. Normand Sloop, M.D.   ASSISTANT:  Dois Davenport A. Rivard, M.D.   ANESTHESIA:  General endotracheal tube.   ESTIMATED BLOOD LOSS:  Minimal.   IV FLUIDS:  1,900 mL of crystalloid.   URINE OUTPUT:  450 mL clear urine.   COMPLICATIONS:  None.   FINDINGS:  Normal ovaries bilaterally, left pelvic side wall adhesions,  normal left fallopian tube, pelvic pseudocyst, and pelvic floor adhesions.  There is a right pelvic side wall and pelvic floor adhesion.  There were two  simple right ovarian cysts, each about a half cm in size.  Normal appearing  appendix and there is a 1 cm right paratubal cyst.   PROCEDURE IN DETAIL:  The patient was taken to the operating room where she  was given general anesthesia, placed in the dorsal lithotomy position, and  prepped and draped in a normal sterile fashion.  A Foley catheter was  placed.  A sponge on a stick was placed in the vagina.  Attention was then  turned to the abdomen where 0.25% Marcaine with epinephrine, 3 mL, was  placed into the umbilical incision.  The incision was made with a scalpel  and  carried down to the fascia.  The fascia was then incised and extended  bilaterally.  The fascia was stitched.  The Hasson was placed and anchored  with the suture from the stitching of 0 Vicryl in the fascia.  The abdomen  was insufflated with three liters of CO2 gas.  Findings noted above were  seen.  Attention was then turned to 2 cm above the symphysis pubis in the  midline where 2 mL of 0.25% Marcaine with epinephrine was injected and a 5-  mm trocar was placed after the incision was made under direct visualization  with the laparoscope.  Findings noted above were seen.  Attention was then  turned to the patient's left ovary.  The left pelvic side wall adhesion was  lysed.  The ovary and tube was noted to be  normal.  There was minimal  bleeding from the adhesion which was cauterized with Kleppingers and made  hemostatic.  Attention was then turned to the right ovary where the patient  had several ovarian pelvic side wall and floor adhesions which were easily  lysed.  Attention was then turned to the pseudocyst which was removed and  sent to pathology.  The patient had two simple right ovarian cyst, less than  a half cm.  One was removed and sent to pathology.  The other one was  cauterized.  There was a small 1-cm right paratubal cyst which was removed.  The appendix was noted to be stable.  The pelvis and abdomen was irrigated  and all areas were noted to be hemostatic.  The instruments were removed  under direct visualization after the air was allowed to leave the abdomen.  The sutures were tied.  There was a small opening filled with fascia which a  figure-of-eight stitch was made with 0 Vicryl.  The skin was closed and  subcuticular fascia with 3-0 Vicryl, both skin incisions.  Sponge, lap, and  needle counts were correct x2.  The patient went to recovery room in stable  condition.                                                Naima A. Normand Sloop, M.D.    NAD/MEDQ  D:   04/19/2003  T:  04/19/2003  Job:  [id number]
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
## 2011-02-23 NOTE — Discharge Summary (Signed)
[facility]  Patient:    [name], [name]                      MRN: [id number] Adm. Date:  [id number] Disc. Date: 04/16/01 Attending:  Mick Sell CC:         Excell Seltzer. [name], M.D.  Izell [place]. Elesa Hacker, M.D.  Dr. Ileana Ladd, Division of Urology, [facility]  Department of Neurology, [facility]. [facility]   Discharge Summary  DATE OF BIRTH:  [birth year]  FINAL DIAGNOSES: 1. Cervical spondylosis with noncritical spinal stenosis. 2. Lumbar spondylosis with radiographic evidence of right S1 radiculopathy,    electrodiagnostic evidence of left S1 radiculopathy at L5-S1 disk, minor    bulges at T10-11 and L4 and L5 without significance. 3. Neurogenic bladder with normal rectal tone and vaginal tone, etiology    unknown. 4. Constipation. 5. Borderline thyroid-stimulating hormone. 6. Dissociative personality disorder. 7. Gait, fine motor movement, and sensory disorders embellished.  PROCEDURES: 1. MRI of brain, MRI of cervical spine, MRI of thoracic spine, prior lumbar    spine MRI carried out as an outpatient. 2. EMG.  COMPLICATIONS:  None.  SUMMARY OF HOSPITALIZATION:  The patient is a 50 year old single woman with a longstanding psychiatric disorder who injured her back two years ago with progressive deterioration over the past two months, walking with a walker. The patient fell at home in her kitchen unwitnessed and complained of difficulty with vision, headache, back pain, variable numbness, and inability to walk.  On examination, the patient gave poor effort with lower extremity strength.  She showed poor quality fine motor movement.  She has inconsistent sensory examination that made evaluation very difficult.  I suspected a hysterical conversion reaction.  Dr. Geoffery Lyons who had seen her previously was called to evaluate her and he noted that she was at baseline psychiatrically with  history of bipolar affective disorder and dissociative personality disorder.  He noted that the patient was working closely with a P.A.-C. team in [facility], and that she had been very difficult.  She has been in the [facility] at [facility] for three to four weeks.  He felt that her psychiatric medications should remain the same.  I had the patient seen by Dr. Frederic Jericho who had previously seen the patient for evaluation January 13, 2001, with the complaint of back and leg pain. Workup included a lumbosacral MRI study which did not demonstrate definite neurosurgical problem.  I personally reviewed this study and it showed evidence of disk at L4-5 and L5-S1 that were central with the L5-S1 pushing towards the right, impinging slightly on the S1 nerve root.  There was no evidence of cord compression.  Dr. Lucia Gaskins conclusion after examining the patient was that she had a multitude of complaints involving motor and sensory symptoms which were wide spread, and that she complained of incontinence of bowel and bladder.  He was not able to find a satisfactory underlying neurosurgical explanation.  I had the patient also seen by Dr. Bjorn Pippin because of her urinary incontinence.  Postvoid residuals were carried out and showed up to 450 cc of postvoid residual.  He believes that in her complicated situation that a formal video urodynamics with EMG would be the most appropriate study, and recommended that this take place at [facility].  He suggested that Dr. Ileana Ladd would be an appropriate person to evaluate her.  Urinalysis carried out failed to show evidence of leukocyte esterase or urine nitrites, and therefore, urine microscopic study was not done.  The patient occasionally had very low grade fevers, but did not complain of dysuria.  On some nights, she would have single episodes of incontinence.  She had several documented elevated postvoid  residuals that were greater than 100 cc.  This came about in the setting of normal rectal tone and normal vaginal tone, positive anal wink.  In order to evaluate her neurologic disorder, I ordered MRI scan of the brain with and without contrast which was normal.  MRI of the cervical spine which showed evidence of central disk at C4-5, 5-6, and 6-7 with ligamentum flavum, hypertrophy at C5-6 and C6-7, creating moderate spinal stenosis at C5-6 and 6-7 without poor compromise.  I saw no evidence of nerve root compression. The radiologist mentioned that the patient had slight impingement bilaterally at the extraforamina at C5-6 and C6-7, and at the uncovertebral, hypertrophy contributed to this process at C6-7.  At C4-5, there was a right paracentral annular disk bulge.  Herniations are present at C5-6 and 6-7.  A thoracic MRI was carried out showing T10-11 mild disk degeneration with minimal bulge, but no evidence of cord compression or focal cord abnormality. Conus medullaris was included in the lumbar study that I viewed from outside which showed a normal conus.  The patient had very significant complaints of overflow fecal incontinence, and we found evidence of stool in the transverse, descending, and sigmoid colon, but the rectum had little stool in it.  The patient did not respond to suppositories and enemas, and therefore was treated with NuLytely one-half gallon and responded with excellent stool output.  KUB showed interval clearing of a large amount of stool previously seen with significant intervals, decrease in stool within the sigmoid and descending colon, and a normal bowel gas pattern.  There was no evidence of obstruction or constipation.  Laboratory studies looking for an etiology for the spinal cord disease showed RPR nonreactive.  TSH was low at 0.32.  Free T4 was low at 0.82 and 0.81.  T4 normal at 6.1.  B12 584, normal.  Folic acid 531, normal.  I spoke with Dr. Reather Littler concerning possible pituitary dysfunction.  I reviewed the MRI of the brain which failed to show evidence of the micro or macroadenoma, or disease of the pituitary or ________ regions.  Hormonal studies included ACTH which was less than 10 which was normal.  Serum estradiol which was 195 in the normal range for a nonpregnant female.  FSH 11.8 which was also in the normal range.  Growth hormone 0.16, normal.  Other laboratory studies on admission with white blood cell count of 7700, hemoglobin 12.3, hematocrit 36.6, MCV 90.8, platelet count 368,000 with 73 neutrophils, 15 lymphs, 7 monocytes, 5 eosinophils.  Comprehensive metabolic with sodium 131, potassium 3.9, chloride 111, CO2 29, glucose 95, BUN 14, creatinine 0.6, calcium 9.3, total protein 6.6.  Albumin 3.4, AST 28, ALT 37, ALP 82, total bilirubin 0.2.  Sedimentation rate 3.  Lumbar puncture was considered, but without any evidence of demyelination or multiple radiculopathies.  I believe that this was a low yield study and that it would more likely result in complications with post lumbar puncture headache which would further complicate the patients course.  Throughout the hospitalization, the patient had stable vital signs.  She had occasional mid afternoon low grade fevers, but overall had a _______ fever occur.  Her  examination today with blood pressure 120/71, resting pulse 90, respirations 21, temperature 97.3.  Lungs clear.  Heart no murmurs.  Pulses normal.  Abdomen soft, bowel sounds normal and nontender.  Extremities no edema.  No deformities.  Neurologic examination - the patient is awake and alert.  This is unusual for her as most of the time I have to awaken her early in the morning.  Cranial nerves - round and reactive pupils.  Fundi normal. Visual fields full to double simultaneous stimuli.  Extraocular movements full.  Symmetric facial strength.  Midline tongue and uvula.  Air conduction greater than bone  conduction bilaterally.  Motor examination - no drift. Normal strength in her upper extremities.  She has clumsiness in her fine motor movement.  She is able to carry out all activities of daily living including hair bands on her hair, carrying out daily hygiene, putting on facial makeup, but then she complains that she cannot hold on to things and then to demonstrate that she drops things for me.  Lower extremity examination - hip flexors 4/5 if her legs are lifted for her.  She shows a positive Hoover sign, meaning that she does not bear down with the contralateral leg when being tested for strength.  This is a sign of both of lack of effort and of embellishment.  The patient had normal strength in her psoas, knee flexor and extensor, foot dorsiflexor and plantar flexor.  Sensation remains inconsistent.  Deep tendon reflexes are brisk.  She had bilateral flexor plantar responses.  I observed her gait.  She is able to walk with a walker.  She pushes down quite hard with her arms and is able to pick up her legs.  There is at times shuffling.  She talks about the left foot rotating outward, but I did not observe that today.  The toes were pointing straight ahead.  She is able to walk to the doorway, turn around, and go back, and get into bed.  At other times, she had lifted her legs into the bed, but she was able to lift them (with her hands).  Today, she lifted her legs definitely into the bed without any difficulty.  Her current medications include Seroquel 100 mg q.i.d., Valium 10 mg q.i.d., Neurontin 300 mg t.i.d., Risperdal 0.25 mg b.i.d., albuterol two puffs as needed, Pulmicort two puffs as needed, Serevent two puffs as needed, Milk of Magnesia as needed, Dulcolax suppository as needed, Fleets enema as needed (none of these works), MiraLax 17 g daily dissolved in 8 ounces of water. Other p.r.n. medications included promethazine and Darvocet-N 100.  She can take this up to six times  a day.  Never took it more than five in the last several days she has been taking only two or three with her lowest day two days ago, one.  She shows allergies to a wide variety of nonsteroidal anti-inflammatory agents and COX-2 inhibitor like Vioxx, Celebrex, penicillins, trimethoprim sulfa, codeine, Nardil, steroids make her manic, and there may be others that I am neglecting to mention.  The patient is discharged in stable condition to go to [facility] at her request.  She is under the care of Dr. Lauree Chandler from the P.A.-C. team.  She will have appointments at [facility] with Dr. Beverely Pace on April 23, 2001, and a second opinion on the neurology service at [facility] on May 14, 2001.  We will endeavor to give Dr. Beverely Pace discharge summary  this visit as well as Dr. Aaron Edelman note and send the neurologist consult notes from Dr. [name] and Dr. Elesa Hacker discharge summary, films from [facility], lumbosacral spinal films, nerve conductions, and EMGs, as well as laboratory studies so that the decision can be made to whether or not if anything else needs to be done.  Much will hinge on whether the urodynamic study shows true neurogenic bladder or whether it shows some other finding. DD:  04/16/01 TD:  04/16/01 Job: [id number]
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
## 2011-02-23 NOTE — H&P (Signed)
[facility]  Patient:    [name], [name]                      MRN: [id number] Adm. Date:  [id number] Attending:  Denny Peon Dictator:   Young Berry Scott, N.P.                         History and Physical  DATE OF EXAM:  Feb 05, 2001.  REVIEW OF SYSTEMS:  Patient reports history of asthma with no recent problems with shortness of breath.  Patient denies any cardiac problems.  No problems with hypertension, palpitations, post nocturnal dyspnea or shortness of breath. Has never had any swelling in her feet or ankles.  NEURO:  Patient denies any history of seizures or syncope.  No memory problems.  No blackouts. MUSCULOSKELETAL:  Patient reports that she has a herniated disk and has been seeing Dr. Elesa Hacker, the neurosurgeon, for this.  She wears a compression brace around her waist covering from mid-thoracic to lower lumbar and sacral area. She wears this constantly.  She also reports that she has had some loss of bowel control and some loss of motor control of her left foot, both of which Dr. Elesa Hacker are aware and she last saw him approximately one month ago.  She does not have surgery planned at this time but Dr. Elesa Hacker is evaluating her for that.  REPRODUCTIVE:  Patient reports that she has a history of partial hysterectomy.  She does have both ovaries in place.  She thinks she may be going into menopause because she does complain of occasional periods of hot and cold flashes.  PHYSICAL EXAMINATION:  GENERAL:  This is a well-nourished, well-developed, white female.  She has somewhat yellowish tone to her complexion, more notable over her face.  Her sclerae, however, are nonicteric.  She is small-framed, thin, generally healthy and strong in appearance.  Her gait is somewhat restricted and she walks gingerly but otherwise is generally normal in appearance.  Has an erect posture.  There appears to be no obvious alterations in her gait.  VITAL  SIGNS:  This morning, temperature 98, pulse 67, respirations 18, blood pressure 106/53.  SKIN:  Slightly yellowish in tone.  Smooth, dry and warm.  Patient is generally free of rashes, although she does have a little erythema at the base of her scalp at her occiput without any flaking.  She reports that this itches.  She also reports itching in her external air canals bilaterally for which she takes Claritin and gets good relief.  Patient has a large amount of thick black hair that appears to be artificially colored that hangs down to the middle of her back, which she is wearing straight.  She also has somewhat excessive facial hair growth around her chin which she keeps shaved but is evident.  HEAD:  Normocephalic without tenderness.  No facial tenderness over the sinuses.  EYES:  PERRLA.  Fundi show no vessel changes bilaterally.  Disks are not visualized.  EARS:  External ear canals are patent without erythema.  Bony landmarks are visualized bilaterally with good light reflex.  NOSE:  Mucosa is pink and moist.  No rhinorrhea.  No swelling of turbinates. Septum is intact.  MOUTH:  Oral mucosa is pink and moist.  Teeth show no evidence of dental work. No leukoplakia noted.  Tongue is midline with very fine fasciculation. Patient reports that she uses oral tobacco  and manual exam of her mouth is negative.  No masses found, no tenderness, no leukoplakia.  NECK:  Supple without thyromegaly.  Patient does have some restriction of range of motion to the right and to the left with rotation but more markedly to the right.  She complains of muscle stiffness in her neck but she is without tenderness or masses.  AC PC nodes are negative.  CARDIOVASCULAR:  Regular rate and rhythm.  No clicks, murmurs or gallops. Radial pulse is synchronous with apical pulse.  Radial and pedal pulses are 2+.  Extremities are pink and warm.  No pedal edema.  Carotids are negative for bruit.  CHEST:   Symmetrical.  LUNGS:  Clear to auscultation.  BREASTS:  Exam deferred.  No CVA tenderness.  ABDOMEN:  Bowel sounds present throughout.  Patient has a two inch scar just above her umbilicus from a previous hernia repair.  No tenderness or masses. No hepatomegaly by percussion.  MUSCULOSKELETAL:  Patients posture is rigid.  She is restricted primarily in range of motion of her left leg primarily by pain.  She is unable to do heel to shin maneuvers using the left leg due to pain.  Generally, her strength is 5/5 throughout.  However, she does show some slight motor weakness barely evident in the left leg.  She is unable to do tiptoe walk.  She is able to do heel walk with difficulty.  Patient complains of pain when bending over.  Is able to reach to her knees but not to her toes.  NEURO:  Cranial nerves II-XII are intact.  Extraocular movements are intact with no nystagmus.  Sensory is intact.  Motor movements show fine motor tremor in both hands at rest, very mild, difficult to detect when they are at rest. Disappears with intention.  Patient notes family history of benign familial tremor.  Gait itself is fairly smooth.  Deep tendon reflexes; it is noted that patient is quite hyperreflexive in her biceps, patellar and Achilles reflexes. They are bilaterally symmetrical and she is 3+ out of 5.  Her Romberg is without findings.  Her Babinski is equivocal. DD:  02/05/01 TD:  02/07/01 Job: [id number]

## 2011-02-23 NOTE — Consult Note (Signed)
[facility]  Patient:    [name], [name]                      MRN: [id number] Proc. Date: 04/07/01 Adm. Date:  [id number] Attending:  Mick Sell CC:         Deanna Artis. Sharene Skeans, M.D.   Consultation Report  CHIEF COMPLAINT:  Urinary and fecal incontinence.  HISTORY OF PRESENT ILLNESS:  This is a 50 year old white female I initially saw in March 2002 for right flank pain.  She had a questionable history of gross hematuria.  She had a negative GU workup at that time with an IVP and cystoscopy.  She was complaining of some frequency and urgency then, but no incontinence.  She was referred for a neurosurgical followup at that time for concern about radicular pain.  She reports that she has had progressive lower extremity weakness and now requires a walker.  She reports the onset of fecal incontinence approximately two months ago and now reports urinary incontinence without urge both while standing and while in bed.  She denies stress incontinence type symptoms, it is more an overflow type picture.  She had a residual urine of 150 cc today, but has recorded voids greater than 200 cc x 3 today.  She denies bowel movement in four days.  ALLERGIES:  Intolerance to PENICILLIN, TORADOL, CODEINE, and NONSTEROIDALS  CURRENT MEDICATIONS:  Seroquel, Valium, Neurontin, Risperdal, Ventolin, Pulmicort, Serevent, Nicoderm, Darvocet, and Ibuprofen.  PAST MEDICAL HISTORY:  Pertinent for COPD and asthma, as well as bipolar disorder and dissociative disorder.  PAST SURGICAL HISTORY:  She had a hysterectomy at age 11 or 62.  She has had a ventral hernia repair x 2 with retained sponge after one procedure and development of abdominal abscess that required another procedure for correction.  SOCIAL HISTORY:  She smokes 1/2 pack-per-day.  She denies alcohol, she is single, and works as a Clinical biochemist.  FAMILY HISTORY:  Noncontributory.  REVIEW OF SYSTEMS:  She has had  some headaches and blurred vision, leg and arm weakness.  She is otherwise without complaint, except as above.  She did report falling a couple of weeks ago and potential possible loss of consciousness.  PHYSICAL EXAMINATION:  VITAL SIGNS:  Temperature 97.8, pulse 86, respirations 18, and blood pressure 114/81.  GENERAL:  She is a well-developed, well-nourished, white female in no acute distress.  Alert and oriented x 3 with flat affect.  ABDOMEN:  Soft, flat, and nontender with normal abdominal tone.  There is no flaccidity noted.  No masses or hepatosplenomegaly are noted.  She has no abdominal or inguinal hernias.  Her bladder is not palpable.  She has no palpable inguinal adenopathy.  GU:  Normal external genitalia.  Urethral meatus and urethra are unremarkable. She has excellent vaginal tone.  The bladder is palpable and unremarkable without mass.  Cervix and uterus are absent.  She has a normal bulbocavernosus reflex.  Anal exam reveals normal wink, normal tone.  Rectal exam reveals no mass.  She has soft stool in the vault.  NEUROLOGIC:  She seems to have diminished strength in lower extremities, 1-2/5 bilaterally, but she is not truly flaccid.  There is some muscular activity, sort of a spastic fashion detected when asking her to flex and extend various areas.  Remarkably she has normal to brisk deep tendon reflexes.  LABORATORY:  Urine negative. Residual urine by catheter today was 150 cc.  IMPRESSION: 1. Urinary and fecal  incontinence of uncertain etiology with an atypical    neurological presentation. 2. She does have some bulging discs, but to this point there has been no    indication of any surgical abnormality. 3. Mildly elevated residual urine.  Will recheck after true void.  RECOMMENDATIONS:  She would probably benefit from video urodynamics and EMG to further clarify her incontinence picture at some point.  That would need to be done at [facility], [place], or [place], and will probably take some time to arrange.  This should not preclude discharge, unless it would other change her management. DD:  04/07/01 TD:  04/08/01 Job: [id number]

## 2011-02-23 NOTE — Op Note (Signed)
NAME:  [name], [name]                         ACCOUNT NO.:  [id number]   MEDICAL RECORD NO.:  [id number]                   PATIENT TYPE:  INP   LOCATION:  5705                                 FACILITY:  [facility]   PHYSICIAN:  Adolph Pollack, M.D.            DATE OF BIRTH:  1961-01-04   DATE OF PROCEDURE:  02/01/2003  DATE OF DISCHARGE:                                 OPERATIVE REPORT   PREOPERATIVE DIAGNOSIS:  Right lower quadrant abdominal pain.   POSTOPERATIVE DIAGNOSIS:  Ruptured left ovarian cyst.   OPERATION PERFORMED:  Diagnostic laparoscopy.   SURGEON:  Adolph Pollack, M.D.   ANESTHESIA:  General.   OPERATIVE FINDINGS:  There was some bloody type fluid in the right gutter  area, the right lower quadrant as well as in the right pelvic area.  The  right ovary and tube appeared normal; however, the left ovary demonstrated  multiple cysts and was slightly inflamed with some bloody clot adherent to a  cystic structure.   INDICATIONS FOR PROCEDURE:  The patient is a 50 year old female who has not  been feeling well for the past two weeks with some intermittent abdominal  pain that got severe, cramping type, right lower quadrant pain with nausea,  and low grade fever prompting her to come to the emergency room.  Ultrasound  was negative.  CT scan could not visualize the appendix, did not show an  obvious inflammatory process.  White cell count was 19,000 and she had right  lower quadrant focal tenderness to palpation and percussion.  She is now  brought to the operating room for diagnostic laparoscopy.   DESCRIPTION OF PROCEDURE:  The patient was brought to the operating room and  placed supine on the operating table and general anesthetic was  administered.  A Foley catheter had already been placed in the bladder.  Her  abdomen was sterilely prepped and draped.  A previous subumbilical incision  was reincised and this incision carried down to the fascia where a  small  incision was made in the midline fascia and the peritoneal cavity was  entered bluntly and under direct vision.  A pursestring suture of 0 Vicryl  was placed around the fascial edges.  A Hasson trocar was introduced into  the peritoneal cavity and pneumoperitoneum created by insufflation of CO2  gas.  Next, under direct vision, a 5 mm trocar was placed in the lower  abdomen.  I  placed her in Trendelenburg position and identified the cecum  which was low-lying and brought this up to the upper pelvis and identified a  normal appendix.  I examined the distal small bowel and ascending colon was  normal.  The gallbladder appeared normal.  The right ovary and tube were  examined and appeared normal.  I noticed a fair amount of bloody fluid in  the right gutter area as well as the right pelvis and some in  the left  pelvis.  When I examined the left tube and ovary, the tube was slightly  inflamed and the ovary had what appeared to be a ruptured cyst with some  blood clotted here and some blood fluid.   Next, I examined the liver and noted no lesions in its.  I irrigated the  abdominal cavity with 1L of saline solution.  I then evacuated as much or  this as possible.  I did not remove the appendix as it was completely  normal.  I subsequently removed the 5 mm trocar and the Hasson trocar and  released the pneumoperitoneum.  I then closed the subumbilical fascial  defect by tightening up and tying down the pursestring suture.  The skin  incisions were then  closed with 4-0 Monocryl subcuticular sutures.  Steri-Strips and sterile  dressings were applied.  The patient tolerated the procedure well without  any apparent complications and was taken to the recovery room in  satisfactory condition.  When she recovers, we will have to get her a gyn  follow-up as an outpatient.                                                Adolph Pollack, M.D.    Kari Baars  D:  02/01/2003  T:  02/02/2003   Job:  [id number]

## 2011-02-23 NOTE — Discharge Summary (Signed)
[name], [name] NO.:  [id number]   MEDICAL RECORD NO.:  [id number]          PATIENT TYPE:  INP   LOCATION:  A225                          FACILITY:  [facility]   PHYSICIAN:  [name], MD   DATE OF BIRTH:  1960/11/12   DATE OF ADMISSION:  01/07/2007  DATE OF DISCHARGE:  04/03/2008LH                               DISCHARGE SUMMARY   ADMISSION AND HOSPITAL COURSE:  [name] is a 50 year old female  patient who presented on January 07, 2007, with melena of three days'  duration and frank red rectal bleeding of one day's duration and she was  admitted with GI bleed with unspecified origin and cause.  She was put  on IV fluid and monitored her hemoglobin and hematocrit and GI was  consulted.  EGD was done by Dr. Jena Gauss and was found two antral ulcers on  the stomach, otherwise there was no other finding and tissue for  pathology was sent.  His assessment was NSAID induced bleeding.  Protonix was continued and outpatient colonoscopy was planned, but the  patient was stable and the only thing she was complaining of was nausea  and vomiting for which she was getting Phenergan and Reglan.  CAT scan  of the abdomen and pelvis was done while she was in the hospital as  well, which showed small to moderate free fluid in the pelvis area of  uncertain etiology, otherwise it was normal.  On January 08, 2007, I was  told that the patient decided to leave against medical advice and she  left without proper advise for follow-up and medications.      [name], MD  Electronically Signed     MT/MEDQ  D:  01/23/2007  T:  01/24/2007  Job:  [id number]

## 2011-02-23 NOTE — Op Note (Signed)
[facility]  Patient:    [name], [name]                      MRN: [id number] Proc. Date: 04/13/00 Adm. Date:  [id number] Disc. Date: [id number] Attending:  Minette Headland                           Operative Report  PREOPERATIVE DIAGNOSIS:       Voluntary sterilization.  POSTOPERATIVE DIAGNOSIS:      Voluntary sterilization.  OPERATION:                    Bilateral tubal ligation.  SURGEON:                      Willey Blade, M.D.  ASSISTANT:  ANESTHESIA:                   Epidural plus local supplementation.  ESTIMATED BLOOD LOSS:         Less than 20 cc.  OPERATIVE FINDINGS:  Normal pelvis.  DESCRIPTION OF PROCEDURE: The patient was taken to the operating room where an adequate level of epidural anesthetic was administered. The patient was placed on the operating table in the supine position. The abdomen was prepped and draped in the usual sterile fashion with Betadine and sterile drapes. The abdomen was entered through a small transverse infraumbilical skin incision and carried down sharply in the usual fashion. The peritoneum was atraumatically entered. Two Army-Navy retractors were then placed inside the abdomen and the tubes were located. First, the left tube was identified and traced through its fimbriated ends to assure its positive identification. The tube was then grasped approximately 2 cm from the uterine fundus. Next, through an avascular region of the mesosalpinx, the Mayo needle carrying one long strand of #1 plain suture was passed atraumatically. The #1 plain suture was cut and the then two segments of #1 plain suture were then used to tie off a 2 cm segment of tube. An approximately 1.5 cm segment of tube was then excised between the two existing #1 plain ligatures. The tube, as mentioned, between the two ligatures was excised and submitted for pathological confirmation. A single free tie of 0 silk was placed on the  medial tubal stump. The same procedure was repeated upon the right tube. Good hemostasis was noted from the operative areas.  Next, attention was turned to closure. The rectus muscle and anterior peritoneum was closed with a running stitch of 0 Vicryl. The subfascial and subcutaneous tissues were hemostatic. The skin was reapproximated with a running subcuticular stitch of 3-0 Vicryl rapide. A sterile dressing applied. Final sponge, needle, and instrument counts were correct x 3. There were no perioperative complications. DD:  04/13/00 TD:  04/15/00 Job: [id number]

## 2011-02-23 NOTE — Discharge Summary (Signed)
[facility]  Patient:    [name], [name]                      MRN: [id number] Adm. Date:  [id number] Disc. Date: [id number] Attending:  Shelba Flake                           Discharge Summary  CHIEF COMPLAINT AND PRESENT ILLNESS:  This was one of several admissions to [facility] for this 50 year old female, who was admitted due to decompensating manic behavior.  She has a history of mania, decreased sleep, experiencing auditory hallucinations that she describes as jibberish. No visual hallucinations.  Patient was recently discharged two days prior to this admission for paranoid behavior and hallucinations.  Patient was to have the ACT team from [facility] help with services.  They did not materialize within few days, found herself enough food and she claimed having a nervous-type breakdown.  Reports she has been eating dog food until her neighbor took her to get food from a church.  She denied any current suicidal or homicidal ideation.  Feeling angry, though her normal dose of Valium had not been recorded since she has been readmitted.  Having no paranoid feelings or behaviors at this time.  Her appetite has been poor.  She said that she has little food at her home.  She feels like she needs to dole it out in small amounts.  She is afraid that it will be gone soon.  PAST PSYCHIATRIC HISTORY:  Recent admission to [facility].  Discharged Feb 12, 2001.  History of bipolar disorder and borderline personality disorder with multiple inpatient hospitalizations.  Follow-up by [facility].  ALCOHOL/DRUG HISTORY:  Chews tobacco.  Denies any alcohol or substance use.  PAST MEDICAL HISTORY:  Chronic asthma and chronic pain.  MEDICATIONS:  Seroquel 25 mg three times a day, 300 mg at bedtime, Neurontin 300 mg three times a day and Valium 5 mg four times a day.  PHYSICAL EXAMINATION:  Performed on the  last admission.  No new findings.  MENTAL STATUS EXAMINATION:  Upon admission, revealed an alert, middle-aged, thin white female, unkempt, dyed jet-black hair, chewing tobacco.  She is lying in bed.  Little eye contact.  Cooperative but seems somewhat irritable. Speech is somewhat pressured.  Normal tone.  Mood is depressed and angry.  Her affect is irritable and blunt.  Thought process is positive auditory hallucinations.  No visual hallucinations.  No current suicidal ideation or homicidal ideation.  No flight of ideas.  Cognitive function is intact. Memory is good.  Judgment is impaired.  Her insight is limited.  ADMITTING DIAGNOSES: Axis I:    Bipolar disorder, manic, with depressed features. Axis II:   Borderline personality disorder. Axis III:  1. Asthma.            2. Chronic back pain. Axis IV:   Moderate. Axis V:    Global Assessment of Functioning:  Upon admission 30; highest            Global Assessment of Functioning in the last year 60.  HOSPITAL COURSE:  She was admitted and started individual and group psychotherapy.  Her medications were adjusted as follows:  She did complain that the Seroquel was too sedating and Valium was not strong enough to help her with the paranoia.  So we went ahead and tried Risperdal 0.125 mg  twice a day and worked to optimize treatment with it.  She claimed several episodes where she feels dissociated, felt her ex-husband was near her.  Also reported some different issues of disturbance of perceptions ______________ hallucination, in feeling like in the twilight zone.  Eventually, she settled down.  Her mood did improve.  Her thinking process improved with family and with Risperdal 0.5 mg, 1 twice a day, Seroquel 400 mg per day, Neurontin 400 mg four times a day, Valium 5 mg four times a day, Ambien 10 mg at bedtime. On this dose, she definitely improved to the point that, on Feb 23, 2001, it was deemed that she was stable enough to be  discharged.  There was still some baseline paranoia and some nightmares but there were not acute and she was willing to follow with [facility] and they were going to provide the ACT team for her.  There were no suicidal ideation, no homicidal ideation.  DISCHARGE DIAGNOSES: Axis I:    Blood pressure with psychotic features. Axis II:   Borderline personality disorder. Axis III:  1. Asthma.            2. Chronic back pain. Axis IV:   Moderate. Axis V:    Upon discharge 50-55.  FOLLOW-UP:  Discharged to [facility] [place] team. DD:  04/30/01 TD:  05/03/01 Job: [id number]

## 2011-02-23 NOTE — H&P (Signed)
NAME:  [name], [name]                         ACCOUNT NO.:  [id number]   MEDICAL RECORD NO.:  [id number]                   PATIENT TYPE:  AMB   LOCATION:  SDC                                  FACILITY:  [facility]   PHYSICIAN:  Naima A. Dillard, M.D.              DATE OF BIRTH:  17-Nov-1960   DATE OF ADMISSION:  04/18/2003  DATE OF DISCHARGE:                                HISTORY & PHYSICAL   CHIEF COMPLAINT:  Chronic pelvic pain.   HISTORY OF PRESENT ILLNESS:  The patient is a 50 year old white female,  gravida 1, para 1, who has been complaining of pain for the last 1-1/2  years.  She reports that the pain is probably due to a history of ruptured  cyst.  The patient had just had surgery on Feb 07, 2003 for right lower  quadrant pain by Dr. Zoila Shutter.  His final analysis was a ruptured ovarian  cyst.  The patient denies having any vaginal discharge or odor.  She says  she does have occasional fever.  She denies having any irregular periods,  painful intercourse, or urinary tract infection symptoms.  She does have a  history of kidney stones.  She denies any diarrhea or rectal bleeding.  She  does have occasional nausea and vomiting.  She denies any history of  endometriosis or fibroids.  She did have an ultrasound on May 25 of this  year which showed two simple ovarian cysts on the right ovary, the largest  measuring 1.5 cm and one left ovarian simple cyst measuring 1.9 x 1.5 cm.  No signs of torsion.  The patient states that she did have a PID and an IUD  removed in 1983.  She also had a hysterectomy for which she does not know at  age 72.  She denies having any gallbladder or appendectomy.  The patient had  a recent right upper quadrant ultrasound in May, which was found to be  negative.  She had diagnostic laparoscopy in 2004.  She also had removal of  kidney stones.   PAST MEDICAL HISTORY:  1. Significant for chronic pelvic and abdominal pain.  2. Chronic constipation.  3. Mitral  valve prolapse.  4. Asthma requiring no intubations.  5. Depression.  6. Degenerative joint disease.  7. Chronic lower back pain.  8. Nephrolithiasis.  9. Abdominal wall hernia.  10.      Left ovarian cyst.  11.      Rectal prolapse per patient.  12.      Peptic ulcer disease.   PAST SURGICAL HISTORY:  1. Total abdominal hysterectomy for unknown cause.  2. Umbilical hernia repair.  3. She also had epigastric hernia repairs that were complicated by infection     and cystoscopies.   ALLERGIES:  PENICILLIN, which causes anaphylaxis, and also questionable to  TORADOL.   MEDICATIONS:  1`.  Neurontin.  1. Lexapro.  2. Albuterol.  SOCIAL HISTORY:  Significant for tobacco use.  She smokes half a pack per  day for 1-1/2 years, denies any alcohol or drug use.   FAMILY HISTORY:  Unremarkable, in that the patient is adopted and does not  really know her family history.   REVIEW OF SYSTEMS:  CARDIOVASCULAR:  No known heart disease or hypertension.  PULMONARY:  No history of pneumonia.  She does, however, have asthma.  GASTROINTESTINAL:  No hepatitis or diverticulosis.  The patient does have a  constant abdominal pain and constipation.  She does have a bowel movement,  though, at least every other day.  ENDOCRINE:  No diabetes or thyroid  disease.  NEUROLOGIC:  No strokes or seizures.  HEMATOLOGIC:  No bleeding disorders.  PSYCHIATRIC:  Significant for depression.   PHYSICAL EXAMINATION:  VITAL SIGNS:  The patient weighed 155 pounds.  Her  blood pressure was 108/60.  HEENT:  Pupils are equal.  Hearing is normal.  Throat is clear.  NECK:  Her thyroid is not enlarged.  HEART:  Regular rate and rhythm.  CHEST:  Clear to auscultation bilaterally.  BREASTS:  Have no masses, skin discharge, or nipple retraction bilaterally.  BACK:  Has no CVA tenderness.  ABDOMEN:  Her incisions from her previous surgery were clean, dry, and  intact.  She had a small hematoma below the umbilicus.   EXTREMITIES:  No cyanosis, clubbing, or edema.  NEUROLOGIC: Exam is within normal limits  GENITALIA:  Her vulvovaginal exam is within normal limits.  Her cervix and  uterus are absent.  Her adnexa were not tender at the time of exam.  She had  no rectovaginal masses.  The patient has chronic pelvic pain and a history  of a ruptured left ovarian cyst.  The patient was offered Lupron and Depo-  Provera.  She first stated that she would get it and then declined.  She has  been back and forth just for pain medicines.  She states that she wants her  left ovary removed because she believes it the cause of her pain.  I told  the patient we will do a diagnostic laparoscopy, possible laparotomy,  removal of ovarian cyst.  I will only remove the ovary if any pathology is  present.  The patient understands the risks are, but not limited to,  bleeding, infection, damage to internal organs, or to bowel, bladder,  ureters, and major blood vessels.  She also understands that the surgery may  not help her pain.  She refuses to try any medical treatment at this time.                                                  Naima A. Normand Sloop, M.D.    NAD/MEDQ  D:  04/18/2003  T:  04/18/2003  Job:  [id number]

## 2011-02-23 NOTE — Discharge Summary (Signed)
[facility]  Patient:    [name], [name]                      MRN: [id number] Adm. Date:  [id number] Attending:  Mick Sell                           Discharge Summary  Please disregard this dictation as patient was discharged by Dr. Dub Mikes.  Manic, lying behavior.  She has history of manic symptoms like decreased sleep, experiencing auditory hallucinations and not being able to think straight.  Patient was discharged only a few days prior to admission from DD:  04/09/01 TD:  04/10/01 Job: [id number]

## 2011-02-23 NOTE — Discharge Summary (Signed)
NAME:  DEANE, MELICK                         ACCOUNT NO.:  [id number]   MEDICAL RECORD NO.:  [id number]                   PATIENT TYPE:  INP   LOCATION:  0443                                 FACILITY:  [facility]   PHYSICIAN:  Deirdre Peer. Polite, M.D.              DATE OF BIRTH:  12-15-1960   DATE OF ADMISSION:  09/17/2003  DATE OF DISCHARGE:  09/20/2003                                 DISCHARGE SUMMARY   DISCHARGE DIAGNOSES:  1. Chronic abdominal pain.  Admitted with partial small bowel obstruction,     now resolved.  2. Gastroesophageal reflux disease.  3. Gastritis.  4. Depression.  5. Tobacco use.  6. Leukocytosis.   DISCHARGE MEDICATIONS:  1. Percocet one to two tabs q.4-6h. p.r.n. pain.  2. Zelnorm 6 mg one p.o. b.i.d.  3. Nexium one p.o. q. day.  4. Lexapro, home dosage.  5. Seroquel, home dosage.   STUDIES:  The patient had an abdominal series which showed a partial small  bowel obstruction, bilateral atelectasis.  CT scan of the abdomen and pelvis  which only showed a small amount of fluid.  No bowel obstruction.  Bowel  pattern normal.  Two tiny left ovarian cysts, otherwise, no acute  abnormalities.  Followup obstruction series showed resolution of partial  small bowel obstruction.   HISTORY OF PRESENT ILLNESS:  A 50 year old female with a history of GERD,  gastritis, depression, with recent GYN surgery for pelvic adhesions  presented to the ED with complaints of crampy abdominal pain, vomiting,  fever, and chills.  Evaluation in the ED revealed partial small bowel  obstruction by x-ray.  CBC with leukocytosis, white count of 8.3.  BMET  within normal limits.  Because of the above, admission was deemed necessary  for further evaluation and treatment.   PAST MEDICAL HISTORY:  As stated above.   MEDICATIONS ON ADMISSION:  1. Nexium.  2. Zelnorm.  3. Neurontin.  4. Lexapro.  5. Seroquel.   SOCIAL HISTORY:  Positive for tobacco, negative for alcohol or drugs.   PAST SURGICAL HISTORY:  1. Ovarian cyst removal approximately four years ago.  2. Lysis of adhesions.   ALLERGIES:  PENICILLIN.   FAMILY HISTORY:  Noncontributory.   HOSPITAL COURSE:  #1-  The patient was admitted to a medical floor bed for  evaluation and treatment of partial small bowel obstruction.  The patient  was given judicious IV fluids and had a CT scan ordered.  Followup CBC  showed resolution of leukocytosis from 18.3 down to 11.1.  CT scan did not  reveal any small bowel obstruction.  The patient still had some complaints  of abdominal pain.  She was continued with analgesia.  On the following day,  the patient request to start eating, that she was hungry.  She said she was  without nausea or vomiting.  A clear liquid diet was instituted.  The  patient tolerated this well,  but still complained of mild abdominal  discomfort.  Followup labs were basically within normal limits.  At this  time, the patient is deemed stable for discharge with the diagnosis of  partial small bowel obstruction, now resolved.  The patient will be  discharged with analgesia, and will be asked to resume her Zelnorm and other  outpatient medications.  She will also be asked to follow up with her  primary M.D.   #2 -  LEUKOCYTOSIS:  This was most likely on the basis of stress  demargination as the patient's white count resolved with just IV fluids.  The patient remained afebrile throughout the remainder of this  hospitalization.  In fact on admission, only had temperature of 97.4.  No  signs for infection had been identified either by physical examination or by  testing.   UA was negative for bacteria.  LFTs were within normal limits.  TSH was  0.68.                                               Deirdre Peer. Polite, M.D.    RDP/MEDQ  D:  09/20/2003  T:  09/20/2003  Job:  [id number]   cc:   Candyce Churn. Allyne Gee, M.D.  [address]  [address]  [place]  [place] [address]  Fax: [phone]

## 2011-02-23 NOTE — Op Note (Signed)
NAMEMCKENA, CHERN               ACCOUNT NO.:  [id number]   MEDICAL RECORD NO.:  [id number]          PATIENT TYPE:  INP   LOCATION:  A225                          FACILITY:  [facility]   PHYSICIAN:  R. Roetta Sessions, M.D. DATE OF BIRTH:  Dec 19, 1960   DATE OF PROCEDURE:  01/08/2007  DATE OF DISCHARGE:                                PROCEDURE NOTE   PROCEDURE PERFORMED:  Esophagogastroduodenoscopy with biopsy.   INDICATIONS FOR PROCEDURE:  The patient is a 50 year old lady admitted  to the hospital with abdominal pain, melena, and hematochezia.  CT of  the abdomen and pelvis is unrevealing for any significant process.  Serial hemoglobin and hematocrit have remained normal.  She has remained  hemodynamically stable.  She takes aspirin powders on a regular basis.  H. pylori serologies were reportedly negative in the past.  EGD is now  being done.  This procedure was discussed with the patient and any  potential risks, benefits, alternatives have been reviewed, questions  answered, and she is agreeable.  Please see documentation in the medical  record.   PROCEDURE DATA:  O2 saturation, blood pressure, pulse, and respiration  were monitored throughout the entire procedure.   CONSCIOUS SEDATION:  Phenergan 25 mg diluted slow IV push, followed by  versed 6 mg IV and fentanyl 150 mcg IV in divided doses.  Cetacaine  spray for topical pharyngeal anesthesia.   INSTRUMENT:  Pentax video __________  .   FINDINGS:  Examination of the tubular esophagus revealed no mucosal  abnormalities.  The G-E junction was easily traversed.  Stomach:  The  gastric cavity was empty.  __________  thorough examination of the  gastric mucosa.  Retroflexion in the proximal stomach, esophagogastric  junction, demonstrated 2 prepyloric gastric ulcers in the antrum; one  was a 5-mm triangular-shaped ulcer and the other was more of a 5-mm  elliptical ulcer.  They both had clean bases.  There was no blood in the  stomach.  These appeared to be benign lesions.  The patient also had a  moderate-sized hiatal hernia.  At the E-G junction, the mucosa almost  had a wrapped appearance, somewhat reminiscent of prior antireflux  surgery.  Otherwise, this area appeared entirely normal.  The pylorus  was patent and easily traversed.  Examination of the bulb and second  portion revealed no abnormalities.  Therapeutic/diagnostic maneuvers  were performed.  These ulcers were biopsied for histologic study.  The  patient tolerated the procedure well __________  endoscopy.   IMPRESSION:  1. Normal esophagus.  2. Moderate-sized hiatal hernia.  3. Two antral ulcers, as described above, appear benign--biopsied.      The remainder of the gastric mucosa appeared normal, normal D1 and      D2.   I suspect NSAID-induced peptic ulcer disease as the cause of melena,  although an NSAID-induced small bowel lesion further downstream could  also have bled recently.   RECOMMENDATIONS:  1. A clear liquid diet.  2. Follow up on pathology.  3. Continue Protonix.  4. Add Carafate 1 gram __________  q.i.d.  5. Would plan for  an outpatient colonoscopy to evaluate hematochezia      once she is over this acute illness.      Jonathon Bellows, M.D.  Electronically Signed     RMR/MEDQ  D:  01/08/2007  T:  01/08/2007  Job:  [id number]   cc:   [facility] P Team, Hospitalist Team
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
## 2011-02-23 NOTE — Procedures (Signed)
[facility]  Patient:    [name], [name]                        MRN: [id number] Proc. Date: 04/25/01 Attending:  Everardo All. Madilyn Fireman, M.D.                           Procedure Report  PROCEDURE:  Esophagogastroduodenoscopy with biopsy.  ENDOSCOPIST:  Everardo All. Madilyn Fireman, M.D.  INDICATIONS FOR PROCEDURE:  Patient with history of hepatitis C with reasonably normal liver function tests who has had a six to seven-day history of epigastric abdominal pain.  Biliary studies, amylase, lipase, and CBC two nights ago were unremarkable.  She has not responded to Zantac given by the emergency room physician.  This procedure is to assess for acid peptic disease of the upper GI tract.  DESCRIPTION OF PROCEDURE:   The patient was placed in the left lateral decubitus position and placed on the pulse monitor with continuous low-flow oxygen delivered by nasal cannula.  She was sedated with 100 mg IV Demerol and 10 mg IV Versed.  The Olympus video endoscope was advanced under direct vision into the oropharynx and esophagus.  The esophagus was straight and of normal caliber with the squamocolumnar line at 38 cm.  There was no visible hiatal hernia, ring, stricture, or other abnormality of the GE junction.  The stomach was entered, and a small amount of liquid secretion was suctioned from the fundus.  Retroflexed view of the cardia was unremarkable.  The fundus body, antrum, and pylorus all appeared essentially normal.  The duodenum was entered and both bulb and second portion were well inspected and appeared to be within normal limits.  The endoscope was then withdrawn back into the stomach and CLOtest obtained.  The scope was then withdrawn, and the patient returned to the recovery room in stable condition.  She tolerated the procedure well, and there were no immediate complications.  IMPRESSION: Essentially normal endoscopy.  PLAN:  Await CLOtest.  Will begin a proton pump  inhibitor.  Further recommendations to follow. DD:  04/25/01 TD:  04/28/01 Job: [id number]

## 2011-02-23 NOTE — H&P (Signed)
[facility]  Patient:    [name], [name]                      MRN: [id number] Adm. Date:  [id number] Disc. Date: [id number] Attending:  Armanda Heritage                         History and Physical  IDENTIFYING DATA:  [name] is a 50 year old divorced white female who was admitted via the [facility] Emergency Department with a history of psychosis and suicidal ideation.  HISTORY OF PRESENT ILLNESS:  The patient has a history of bipolar disorder and reports being noncompliant with her medications over the past month.  She reports that she had intermittently been hearing voices which told her to stop the medicine.  She reports over the past two weeks that she has had worsening auditory hallucinations, paranoid ideations, and suicidal ideation.  The voices have been telling her she does not need the medication and they have been telling her to harm herself.  She has been paranoid, being afraid to eat food, and thinking that others were talking about her or wanting to hurt her. She also feels that people could read her mind.  She has had increasing suicidal thoughts with intention to overdose.  She reports feeling increasingly depressed with decreased sleep, decreased appetite, increased irritability, and feelings of confusion.  PAST PSYCHIATRIC HISTORY:  The patient has had multiple hospitalizations since age 26.  She was last hospitalized at [facility] in December of 2000.  She is followed by Dr. _________ at the [facility].  She has been tried on a variety of medications in the past.  She had been discharged from [facility] on Zyprexa, Neurontin, Topamax, and Valium.  Recently, she apparently had been switched from Topamax to Tegretol. As noted, she has been noncompliant with her medications recently.  PAST MEDICAL HISTORY:  Patient has been followed by Dr. Delford Field.  She has a history of asthma.  She has been off of  her medications as noted.  She has taken albuterol in the past.  ALLERGIES:  She reports being allergic to PENICILLIN.  SOCIAL HISTORY:  The patient has been divorced for eight years.  She has a 13 year old daughter.  The patient lives alone.  She has a high school education and had worked as a Emergency planning/management officer in the past.  Her mother had died in the fall of Mar 04, 1999 and her father had subsequently remarried shortly thereafter.  FAMILY HISTORY:  There is no known family history of psychiatric illness.  ALCOHOL AND DRUG HISTORY:  The patient denies any history of alcohol or drug abuse.  REVIEW OF SYSTEMS/PHYSICAL EXAMINATION:  Performed at [facility] Emergency Department with no significant findings.  MENTAL STATUS EXAMINATION:  The patient presents as a casually dressed middle-aged white female.  Speech is normal.  Thought processes show evidence of auditory hallucinations, paranoid ideation, and some mild disorganization. She has suicidal ideation with thoughts of overdosing.  Mood is depressed. Affect is sad and anxious.  Oriented x 3.  Cognitive functioning is intact.  ADMITTING DIAGNOSES: Axis I:    Bipolar disorder, depressed. Axis II:   Borderline personality disorder. Axis III:  Asthma. Axis IV:   Psychosocial stressors, moderate. Axis V:    Global assessment of functioning, current 20, highest in past            year was 59.  TREATMENT PLAN:  The patient will be placed back on a combination of Tegretol, Neurontin, Zyprexa, and Valium.  When stabilized she will be referred back to the [facility]. DD:  03/31/00 TD:  03/31/00 Job: [id number]

## 2011-02-23 NOTE — Discharge Summary (Signed)
[facility]  Patient:    [name], [name]                      MRN: [id number] Adm. Date:  [id number] Disc. Date: [id number] Attending:  Ephriam Knuckles H                           Discharge Summary  INTRODUCTION:  [name] is a 50 year old divorced white female who lives alone and has a long history of mental problems.  The patient was admitted on a voluntary basis after she started experiencing command type hallucinations telling her to stop her medications, stop eating, and this way die.  At the initial examination, she still wanted to die.  There were multiple stressors in her life prior to her admission.  In addition to these, she was convinced that people were against her, watching her and telling things behind her back.  She told me that she rejects the idea of any sexual behavior as being dirty and she wanted to remove her sex organs altogether.  In the past she was hospitalized several times including the last time at [facility] in June 2001.  She has a history of mood swings but rejected any medication, especially lithium and Depakote.  Initial impression was bipolar disorder, depression, rule out schizoaffective disorder, bipolar type, depressed, and borderline personality disorder.  HOSPITAL COURSE:  After admission to the ward, the patient was placed on special observation and medications were encouraged.  On May 1, she still had a lot of somatic preoccupation but much better, agreed to eat breakfast and agreed to take her medications.  She still experienced some auditory hallucinations but did not feel she was as disturbed by their content as before.  On May 2, more depressed and more somatic; however, appetite was better.  The patient agreed to use Neurontin and Risperdal, which both could have a potential to stabilize her mood.  Unfortunately on May 3, she refused Risperdal and almost immediately started having more  psychotic features.  She requested discontinuation of Risperdal but agreed to take a higher dose of Seroquel, which was accomplished.  The patient agreed also to increase dose of Neurontin.  Because of signs of anemia with low iron level, she was started on an iron pill twice a day 325 mg daily.  On May 6, she still felt depressed and telling that if discharged, she has nothing to live for.  She denied straight suicidal thoughts but sometimes felt that suicide was the only option for her. I further increased the patients Seroquel and Neurontin.  On May 7, she was briefly seen in the emergency room due to abdominal pain but apparently nothing was found.  On May 8, she was doing better with cleaner sensorium, bright affect, slept well, no dangerous ideations, agreeable for discharge, able to promise safety, tolerating medication without side effects.  It was the teams feeling that the patient will be safe to be discharged home and Amanda Pea contacted [facility] about change of diagnosis to schizoaffective disorder, which was done just prior to the patients discharge.  MEDICAL PROBLEMS:  As mentioned, she had an episode of abdominal pain which proved to be benign.  Vital signs were stable with normal blood pressure, respiration rate, and temperature.  Review of blood work showed mild anemia with hemoglobin 11, hematocrit 33.3, red blood cells 3.6 million.  Chem 17 was  normal.  Thyroid function tests were normal with the exception of borderline free T4.  Urinalysis was normal.  DISCHARGE DIAGNOSES: Axis I:    Schizoaffective disorder, bipolar type, depressed. Axis II:   Borderline personality disorder. Axis III:  1. Asthma.            2. Chronic back pain.            3. Iron-deficiency anemia. Axis IV:   Psychosocial stressors are moderate to severe related to money,            social environment, occupational problems. Axis V:    Global assessment of functioning  at the time of admission was 25,            maximum for the past year was 60, upon discharge 55.  DISCHARGE MEDICATIONS: 1. Valium 5 mg four times a day. 2. Pulmovent inhaler two puffs twice a day. 3. Serevent inhaler two puffs three times a day as needed. 4. Ferrous sulfate 325 mg one tablet daily. 5. Seroquel 25 mg a.m. and noon and 150 mg at bedtime. 6. Neurontin 300 mg two capsules four times a day. 7. Zoloft 25 mg daily. 8. Zyrtec one tablet p.r.n. allergies.  DISCHARGE INSTRUCTIONS:  If any problems, the patient should call emergency number.  FOLLOWUP: 1. [facility] on May 22 at 11:30 in the morning. 2. Family physician for further treatment of anemia. DD:  03/24/01 TD:  03/24/01 Job: [id number]

## 2011-02-23 NOTE — H&P (Signed)
[facility]  Patient:    [name], [name]                        MRN: [id number] Adm. Date:  02/14/01 Attending:  Madie Reno A. Dub Mikes, M.D. Dictator:   Landry Corporal, N.P.                   Psychiatric Admission Assessment  IDENTIFYING INFORMATION:  This is a 50 year old divorced white female, voluntarily admitted to [facility] on Feb 14, 2001, for decompensating manic behavior.  HISTORY OF PRESENT ILLNESS:  Patient presents with a history of mania symptoms, decreased sleep, experiencing auditory hallucinations that she describes as "gibberish."  No visual hallucinations.  Patient was recently discharged 2 days ago from [facility] for paranoid behavior and hallucinations.  Patient was to have ACT from [facility] help with services.  They had not materialized within those few days.  Patient found herself with no food, having a nervous-type breakdown.  She reports she has been eating dog food until her neighbor took her to get food from a church.  She is denying any current suicidal or homicidal ideations.  She is feeling angry though that her normal dose of Valium has not been reordered since she has been readmitted.  Patient having no paranoid feelings or behaviors at this time.  Her appetite has been poor as she states she has little food in her home and the little that she has she feels like she needs to dole it out in small amounts.  She is afraid that it will be gone soon.  PAST PSYCHIATRIC HISTORY:  She has had a recent admission to [facility].  Discharge was Feb 12, 2001.  She has a history of bipolar disorder and borderline personality disorder.  Patient has had multiple other inpatient hospitalizations.  Patient is followed up at [facility].  SOCIAL HISTORY:  She is a 50 year old divorced white female.  She has a 65 year old daughter.  She lives alone.  She is disabled.  She has  a 12th education.  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  She has no legal problems at this time.  FAMILY HISTORY:  None.  ALCOHOL DRUG HISTORY:  She chews tobacco.  She denies any alcohol or substance abuse.  PAST MEDICAL HISTORY:  Primary care Notnamed Croucher is Dr. Delford Field near [place]. Medical problems are asthma and chronic pain.  Medications are Seroquel 25 mg t.i.d., 300 at bedtime, Neurontin 300 mg t.i.d. and Valium 5 mg q.i.d.  DRUG ALLERGIES:  PENICILLIN, TORADOL and CODEINE.  PHYSICAL EXAMINATION:  Performed at last admission.  We have a urine drug screen pending.  MENTAL STATUS EXAMINATION:  She is an alert, middle-aged, thin white female. She is unkempt.  She has died jet-black hair.  She is chewing tobacco.  She is lying in bed.  She has little eye contact.  She is cooperative but seems somewhat irritable.  Speech is somewhat pressured, normal tone.  Mood is depressed and angry.  Her affect is irritable and blunt.  Thought processes are positive auditory hallucinations, no visual hallucinations, no current suicidal ideation or homicidal ideation, no paranoia, no flight of ideas. Cognitive function is intact.  Her memory is good, her judgment is impaired, her insight is limited.  Questionable reliability.  ADMISSION DIAGNOSES: Axis I:    Bipolar disorder, manic, with psychotic features.  Axis II:   Borderline personality disorder by history. Axis III:  Asthma and chronic back pain. Axis IV:   Moderate, with problems related to social environment, economics,            and access to health care services. Axis V:    Current 30, this past year 51.  INITIAL PLAN OF CARE:  Voluntary admission for decompensating, decreased sleep and decreased appetite, with psychotic behavior, and she is to contract for safety.  We will monitor her every 15 minutes.  Patient agrees to be safe. Will resume her routine medications.  Will  add Ambien  for sleep.  Caseworker is to see if they can expedite services for this client.  Our plan is to return her to prior living arrangement, to follow up with [facility] and for her to be compliant with her medications.  TENTATIVE LENGTH OF STAY:  Three to four days, pending expediency of services for this client. DD:  02/15/01 TD:  02/15/01 Job: [id number]

## 2011-02-23 NOTE — Discharge Summary (Signed)
   NAME:  [name], [name]                         ACCOUNT NO.:  [id number]   MEDICAL RECORD NO.:  [id number]                   PATIENT TYPE:  AMB   LOCATION:  SDC                                  FACILITY:  [facility]   PHYSICIAN:  Adolph Pollack, M.D.            DATE OF BIRTH:  Dec 26, 1960   DATE OF ADMISSION:  02/01/2003  DATE OF DISCHARGE:  02/03/2003                                 DISCHARGE SUMMARY   PRINCIPLE DISCHARGE DIAGNOSIS:  Ruptured left ovarian cyst.   SECONDARY DIAGNOSES:  1. Chronic abdominal pain.  2. Chronic constipation.  3. Mitral valve prolapse.  4. Asthma.  5. Depression.  6. Degenerative joint disease.  7. Left ovarian cyst.  8. Rectal prolapse.  9. Peptic ulcer disease.   PROCEDURE:  Diagnostic laparoscopy.   REASON FOR ADMISSION:  This is a 50 year old female with chronic abdominal  pain and constipation who presented to the emergency department complaining  of increased right mid abdominal pain and lower abdominal pain with nausea  and low grade fever for the past two weeks.  She was seen in the emergency  department and noted to have a white blood cell count of 19,200.  She had  right mid and lower abdominal tenderness.  A CT scan was equivocal for  appendicitis.  She subsequently was admitted.   HOSPITAL COURSE:  She was admitted and taken to the operating room for  diagnostic laparoscopy where a ruptured left ovarian cyst was found with  some free blood in the peritoneal cavity.  She was irrigated out.  Postoperatively she was stable and the white blood cell count normalized.  She was started on some Celebrex and diet was advanced.  She was given a  laxative and had a very large bowel movement on her second postoperative day  and was felt to be ready for discharge.   DISPOSITION:  Discharge to home, 02/03/03.   She was told to contact her gynecologist about the left ovarian cyst.  She  was given Tylox for pain and told to resume all her home  medicines.  She  will call me back in 2 weeks for followup or sooner for any problems.  She  was discharged in satisfactory condition.                                               Adolph Pollack, M.D.    Kari Baars  D:  03/30/2003  T:  03/31/2003  Job:  [id number]

## 2011-02-23 NOTE — Consult Note (Signed)
[facility]  Patient:    [name], [name]                      MRN: [id number] Proc. Date: 04/07/01 Adm. Date:  [id number] Attending:  Mick Sell                          Consultation Report  HISTORY OF PRESENT ILLNESS:  [name] is a 50 year old female admitted through the emergency room by [name].  I saw [name] in my office January 13, 2001 for a chief complaint of back and leg pain.  A workup at that time included a lumbosacral MRI study which did not demonstrate a definite significant neurosurgical problem.  She did have degenerative disk changes, worse at the two lower levels, with some bulging of the disks, but no definite surgical problem at that time.  She was treated conservatively with a lumbosacral corset.  Since then, she apparently has had difficulty with a number of things that include visual aberrations that include blurring vision and double vision. She has had trouble with motor function of her left upper extremity.  She has developed incontinence of bowel and bladder and has had difficulty with motor function of both legs.  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  All of this led to her being seen in the emergency room and then subsequently admitted on April 03, 2001.  I should also note that she has been followed by [name]. [name] because of some difficulty with bladder function.  REVIEW OF SYSTEMS:  As recorded in the chart.  FAMILY HISTORY:  As recorded in the chart.  PAST MEDICAL HISTORY:  Pertinent for asthma.  Past medical history is also pertinent for psychiatric problems with what has been described as a bipolar disorder requiring hospitalizations once or twice per year.  PAST SURGICAL HISTORY:  She has had an abdominal hernia repaired on two occasions and says that on one occasion, a sponge was left in and had to be reoperated upon and she had septic shock as a  result of that.  PHYSICAL EXAMINATION:  NEUROLOGIC:  She is alert and cooperative.  She seems to be oriented, although she did not remember where she had seen me but then did when I reminded her. Her response in conversation is relatively appropriate at this time. Cranial nerves appears to be intact.  Motor examination reveals a varying amount of motor ability involving the left upper extremity.  She describes changes in her hands that she calls drawing or clawing and notes that particularly the fourth and fifth fingers have been causing her difficulty so far as function is concerned.  She has trouble with grip and demonstrated this by dropping small objects.  The position and attitude and the amount of movement seen spontaneously in the left hand is greater than when one tries to test it.  There is no drift of the outstretched extremities.  The motor examination of the lower extremities was unusual in that she said she could not lift her legs off the bed but if I would lift them past the sticking point, then she would be able to take them on off, and indeed this was the case.  If I passively moved her legs up six inches or so off the edge of the bed, she could then maintain that position and raise them from then on  up.  Her deep tendon reflexes are 1 to 2+ and symmetric in both the upper and lower extremities with no evidence of pathologic reflexes.  She has no clonus and she has no Hoffmanns.  Sensory examination was difficult to evaluate but there were spotty areas of hypesthesia here and there.  Gait was not tested.  She told me she could not walk without a walker and most of the time had to have additional help.  IMAGING STUDIES:  I reviewed a cervical MRI and cranial MRI that had been done recently; those were done in this hospital.  [name] was sure that she had a repeat lumbosacral MRI, which I did not find in the jacket, nor do I see where a lumbosacral MRI was ever  ordered.  A review of the cervical MRI does show some narrowing of the canal at C5-C6 and C6-C7 with the presence of a minimal amount of subarachnoid space at these two levels.  There is no frank spinal cord compression at this time.  I felt that the cranial MRI was normal.  IMPRESSION:  A multitude of complaints involving various motor and sensory symptoms, wide spread.  She also complains of incontinence of bowel and bladder.  I am not able to find a satisfactory underlying neurosurgical explanation for all of these problems at this particular time.  I think she has been adequately worked up and I am in agreement with the idea of a discharge to an assisted living facility and conservative followup as seems appropriate. DD:  04/07/01 TD:  04/08/01 Job: [id number]

## 2011-02-23 NOTE — H&P (Signed)
NAMEKIMMIE, Lewis               ACCOUNT NO.:  [id number]   MEDICAL RECORD NO.:  [id number]          PATIENT TYPE:  INP   LOCATION:  A225                          FACILITY:  [facility]   PHYSICIAN:  [name], MD   DATE OF BIRTH:  05/30/1961   DATE OF ADMISSION:  01/07/2007  DATE OF DISCHARGE:  [facility]                              HISTORY & PHYSICAL   CHIEF COMPLAINT:  Rectal bleeding.   HISTORY OF PRESENT ILLNESS:  [name] is a 50 year old female patient  with no significant past medical history who presented with frank red  rectal bleeding since yesterday.  The patient stated that she had one  episode of vomiting and she had repeated of bright red rectal bleeding,  not associated with defecation.  The patient noticed also darkening of  her stool for the last 3 days.  Associated with that she has lower  abdominal pain, but she denied any fever.  The patient denied any  history of such episodes before.  She has no urinary complaints.  She  had history of surgery 1 month ago for hernia repair which was a smooth  procedure and she was taking pain medication, aspirin-related  medications since 1 month.  The patient denied any chest symptoms.  She  has no headache.  She has no lightheadedness.  She has no visual  disturbance.   REVIEW OF SYSTEMS:  Ten-point review of systems is dictated on the HPI,  otherwise noncontributory.   ALLERGIES:  SHE IS ALLERGIC FOR PENICILLIN AND TORADOL.   SOCIAL HISTORY:  She smokes one pack per day for a long time.  She  stated that she is a social drinker and she denied any drug use.   FAMILY HISTORY:  Noncontributory.   PAST MEDICAL HISTORY:  1. Degenerative disk disease on the spine.  2. Headaches.  3. Posttraumatic syndrome.   SURGICAL HISTORY:  1. She has multiple hernia repairs, the last is 1 month ago.  2. Hysterectomy.   HOME MEDICATIONS:  1. Ativan 0.5 mg three times a day.  2. Albuterol 2 puffs p.r.n.  3. Prevacid 30 mg once a  day.  4. Reglan p.r.n., unspecified dose.   LABORATORIES:  White blood cell 8.9, hemoglobin is 12.8 and hematocrit  is 37.5 and platelet count is 295.  On the chemistry, sodium is 137 and  potassium is 4.4, chloride is 106, bicarb 24, glucose 103 and creatinine  0.59.  Amylase and lipase are within normal range.  Urinalysis is  negative.   PHYSICAL EXAMINATION:  The patient is lying in the ER bed, she is  complaining of abdominal pain, no respiratory distress.  VITAL SIGNS:  Blood pressure is 108/68 and temperature is 98.2, pulse is  83 and respiratory rate is 18 per minute, and she is saturating 99%.  HEENT:  She has pink conjunctivae.  Nonicteric sclera, moist oral  mucosa.  NECK:  Supple.  CHEST:  Good air entry bilaterally, clear.  CVS: S1-S2 regular.  No murmur.  ABDOMEN:  Flat, normoactive bowel sounds.  There is a diffuse area of  tenderness more on  the right lower side of the abdomen.  There is no  guarding or rigidity.  PR was done in emergency room by emergency room  staff and it was frank blood. EXTREMITIES:  No pedal edema.  Peripheral  pulses positive.  CNS:  She is alert and well-oriented.   ASSESSMENT:  1. Gastrointestinal bleed, most likely lower gastrointestinal even      though she gave also dark stools and the patient mentioned that she      was taking for pain aspirin related medications and probably that      might be also as a causative agent.  The plan is I will admit her      and I will rehydrate her with IV fluid and we will monitor CBC, and      we will call GI for evaluation and management as well.  2. Abdominal pain.  It could be related to problem number one, but      there is tenderness more on the right side and I will do CAT scan      of the abdomen to rule out any cause of the tenderness including      any infected fluid collection secondary to the surgery.  In      addition we will put her on PPI and will manage her pain as well.       [name], MD  Electronically Signed     MT/MEDQ  D:  01/07/2007  T:  01/07/2007  Job:  [id number]

## 2011-02-23 NOTE — H&P (Signed)
NAME:  [name], [name]                         ACCOUNT NO.:  [id number]   MEDICAL RECORD NO.:  [id number]                   PATIENT TYPE:  INP   LOCATION:  5705                                 FACILITY:  [facility]   PHYSICIAN:  Adolph Pollack, M.D.            DATE OF BIRTH:  03/15/1961   DATE OF ADMISSION:  02/01/2003  DATE OF DISCHARGE:                                HISTORY & PHYSICAL   CHIEF COMPLAINT:  Right lower quadrant and lower abdominal pain.   HISTORY OF PRESENT ILLNESS:  This is a 50 year old female who has got  chronic intermittent abdominal pain with constipation.  She presented to the  emergency department with severe right mid and lower abdominal pain with  nausea and low-grade fever.  She states she has been having a little fever  over the past two weeks.  She has not been feeling well over the past two  weeks in general.  She has had a little nausea.  The pain was constant in  the right lower quadrant area with intermittent cramping at time.  She  reports increased fatigue over the past two weeks.  She had two normal bowel  movements yesterday.  No urinary symptoms.  Apparently, she had a little bit  of left adnexal tenderness on an EDP exam prior to my arrival.   PAST MEDICAL HISTORY:  1. Chronic abdominal pain.  2. Chronic constipation.  3. Mitral valve prolapse.  4. Asthma.  5. Depression.  6. Degenerative joint disease with chronic lower back pain.  7. Nephrolithiasis.  8. Abdominal wall herniae.  9. Left ovarian cyst.  10.      Rectal prolapse.  11.      Peptic ulcer disease.   PREVIOUS OPERATIONS:  Hysterectomy; umbilical hernia repair; epigastric  hernia repairs that were complicated by infection; cystoscopies.   ALLERGIES:  PENICILLIN causes anaphylaxis and questionable allergy to  TORADOL.   MEDICATIONS:  Neurontin, Lexapro, albuterol p.r.n.   SOCIAL HISTORY:  She does smoke cigarettes.  No tobacco use.   FAMILY HISTORY:  She is adopted so  it is noncontributory.   REVIEW OF SYSTEMS:  CARDIOVASCULAR:  No known heart disease or hypertension.  PULMONARY:  No pneumonia or TB.  She does have reports of COPD on her chest  x-ray.  GI:  No hepatitis or diverticulitis.  She does have chronic  constipation and intermittently has to strain to have a bowel movement and  notes bright red blood per rectum.  ENDOCRINE:  No diabetes or thyroid  disease.  NEUROLOGIC:  No strokes or seizures.  HEMATOLOGIC:  No known  bleeding disorders, transfusions or deep venous thrombosis.   PHYSICAL EXAMINATION:  GENERAL:  Generally, a well-developed, well-nourished  female in no acute distress.  VITAL SIGNS:  Temperature is 97.7, blood pressure 107/54, pulse 75.  EYES:  Extraocular motions intact.  No icterus.  SKIN:  No jaundice.  NECK:  Neck supple without palpable masses or thyroid enlargement.  CARDIOVASCULAR:  Heart demonstrates a regular rate and rhythm with no murmur  heard.  RESPIRATORY:  Breath sounds equal and clear, respirations nonlabored.  ABDOMEN:  Abdomen is soft with lower transverse scar, subumbilical scar and  epigastric scar.  She has tenderness to palpation and percussion in the  right lower quadrant at McBurney's point.  Mild tenderness in the right  lower abdominal area, very mildly in the left lower quadrant region.  RECTAL:  Normal tone, no prolapse, no masses, no gross blood.  EXTREMITIES:  Extremities with full range of motion and no cyanosis or  edema.   LABORATORY DATA:  White count is 19,200.  Hemoglobin 13.4.  Electrolytes  normal.  Liver functions and lipase normal.  Urease negative.   Abdominal ultrasound demonstrates normal gallbladder.   CT of the abdomen and pelvis:  Appendix was not seen; gallbladder was  normal.  No free fluid was noted.  No obvious inflammatory process is seen.   IMPRESSION:  Right lower quadrant pain with leukocytosis -- unfortunately,  appendix not well seen on CT scan.  I have a concern  that she may have  appendicitis not seen by the CT scan.  Other differential diagnoses could  include a nonspecific enteritis or ovarian cyst that had ruptured.   PLAN:  I have discussed options with her including observation or going to  the operating room for diagnostic laparoscopy to make sure she does not have  appendicitis and she preferred the latter option.  I went over the procedure  and the risks including, but not limited to, bleeding, infection, risks of  anesthesia, and accidental injury to intra-abdominal organs.  She seems to  understand this and wished to proceed.                                               Adolph Pollack, M.D.    Kari Baars  D:  02/01/2003  T:  02/02/2003  Job:  [id number]

## 2011-03-03 ENCOUNTER — Emergency Department (HOSPITAL_COMMUNITY)
Admission: EM | Admit: 2011-03-03 | Discharge: 2011-03-03 | Disposition: A | Discharge status: Home / Self Care | Payer: PRIVATE HEALTH INSURANCE | Attending: Emergency Medicine | Admitting: Emergency Medicine

## 2011-03-03 DIAGNOSIS — J3489 Other specified disorders of nose and nasal sinuses: Secondary | ICD-10-CM | POA: Insufficient documentation

## 2011-03-03 DIAGNOSIS — Z7982 Long term (current) use of aspirin: Secondary | ICD-10-CM | POA: Insufficient documentation

## 2011-03-03 DIAGNOSIS — K219 Gastro-esophageal reflux disease without esophagitis: Secondary | ICD-10-CM | POA: Insufficient documentation

## 2011-03-03 DIAGNOSIS — R51 Headache: Secondary | ICD-10-CM | POA: Insufficient documentation

## 2011-03-03 DIAGNOSIS — Z79899 Other long term (current) drug therapy: Secondary | ICD-10-CM | POA: Insufficient documentation

## 2011-03-03 DIAGNOSIS — A4902 Methicillin resistant Staphylococcus aureus infection, unspecified site: Secondary | ICD-10-CM | POA: Insufficient documentation

## 2011-03-13 ENCOUNTER — Other Ambulatory Visit: Payer: Self-pay | Admitting: Urgent Care

## 2011-03-13 ENCOUNTER — Ambulatory Visit (INDEPENDENT_AMBULATORY_CARE_PROVIDER_SITE_OTHER): Payer: PRIVATE HEALTH INSURANCE | Admitting: Urgent Care

## 2011-03-13 ENCOUNTER — Encounter: Payer: Self-pay | Admitting: Urgent Care

## 2011-03-13 VITALS — BP 119/71 | HR 77 | Temp 97.8°F | Ht 65.0 in | Wt 159.4 lb

## 2011-03-13 DIAGNOSIS — R109 Unspecified abdominal pain: Secondary | ICD-10-CM

## 2011-03-13 DIAGNOSIS — R110 Nausea: Secondary | ICD-10-CM

## 2011-03-13 DIAGNOSIS — K219 Gastro-esophageal reflux disease without esophagitis: Secondary | ICD-10-CM

## 2011-03-13 MED ORDER — OMEPRAZOLE 20 MG PO CPDR: 20.0000 mg | DELAYED_RELEASE_CAPSULE | Freq: Two times a day (BID) | ORAL | Status: DC

## 2011-03-13 MED ORDER — ONDANSETRON HCL 4 MG PO TABS: 4.0000 mg | ORAL_TABLET | Freq: Three times a day (TID) | ORAL | Status: DC | PRN

## 2011-03-13 NOTE — Assessment & Plan Note (Addendum)
[name] is a 50 y.o. caucasian female w/ chronic nausea x several months along w/ epigastric pain that is worse post-prandially & Intermittent vomiting.  Hx of SBO's requiring surgery yrs ago, as well as GERD & PUD.  No evidence of acute obstruction at this time.  She appears non-toxic/stable.  Differentials include PUD in setting of NSAIDS, NSAID-induced gastritis, intermittent ileus/partial SBO, occult gallbladder disease, or gastroparesis.    EGD w/ Dr Darrick Penna for further evaluation.  I have discussed risks & benefits which include, but are not limited to, bleeding, infection, perforation & drug reaction.  The patient agrees with this plan & written consent will be obtained.    Zofran 4mg  q 8hrs prn N/V NO IBUPROFEN or ASPIRIN INCREASE OMEPRAZOLE 20MG  TWICE PER DAY BEFORE BREAKFAST/DINNER

## 2011-03-13 NOTE — Assessment & Plan Note (Signed)
?   Culprit of epigastric pain.  On PPI, will increase to BID.  Further evaluation w/ EGD.

## 2011-03-13 NOTE — Assessment & Plan Note (Signed)
Epigastric pain.  See above.

## 2011-03-13 NOTE — Patient Instructions (Signed)
NO IBUPROFEN or ASPIRIN INCREASE OMEPRAZOLE 20MG  TWICE PER DAY BEFORE BREAKFAST/DINNER

## 2011-03-13 NOTE — Progress Notes (Signed)
Referring Provider: Isabella Stalling, MD Primary Care Physician:  Elby Showers, MD Primary Gastroenterologist:  Dr. Darrick Penna  Chief Complaint  Patient presents with  . Abdominal Pain  . Nausea    HPI:  [name] is a 50 y.o. female here as a referral from Dr. Janna Arch for constant nausea past several months.  Last seen by Dr. Jonette Eva in 09/2008 with IBS & GERD.  Her nausea is worse after eating.  Worse in AM.  Vomiting couple times a week.  Using zofran 4mg  q 8hrs prn, seems to help.  Also takes omeprazole 20mg  daily.  Recent treated for MRSA sinus cavity s/p drainage and antibiotics.  Seems like doxy & clindamycin exacerbated abd pain.  "Felt like fire" in abd & points to epigastrum.  Denies diarrhea or constipation.  Denies rectal bleeding or melena.  +Chills, sweats.  No fever.  Takes IBU 800mg  once daily of needed.  Also ASA 325mg  1-2 per day for chronic back pain.  Takes w/ TUMs.  Hx PUD as below.  Weight has steadily increased over the yrs.  Past Medical History  Diagnosis Date  . S/P colonoscopy 2011    Pt reports Dr Linna Darner for rectal bleeding  . PUD (peptic ulcer disease) 01/2007    EGD Dr Jena Gauss, 2 antral ulcers, negative h pylori  . MRSA infection     Dr. Lenice Pressman, currently under treatment  . Hiatal hernia 2008    on EGD above  . Asthma   . PTSD (post-traumatic stress disorder)   . Migraines   . Bipolar affective disorder   . Degenerative disc disease   . Rectal prolapse   . SBO (small bowel obstruction)   . ADHD (attention deficit hyperactivity disorder)     Past Surgical History  Procedure Date  . Bowel resection     x2, secondary to adhesions  . Appendectomy   . Laparoscopic lysis intestinal adhesions   . Back surgery   . Abdominal hysterectomy   . Tubal ligation   . Umbilical hernia repair     x5    Current Outpatient Prescriptions  Medication Sig Dispense Refill  . ALPRAZolam (XANAX) 1 MG tablet Take 1 mg by mouth 3 (three) times  daily. 1/2 pill       . DULoxetine (CYMBALTA) 60 MG capsule Take 60 mg by mouth 2 (two) times daily.        Marland Kitchen omeprazole (PRILOSEC) 20 MG capsule Take 1 capsule (20 mg total) by mouth 2 (two) times daily.  60 capsule  0  . ondansetron (ZOFRAN) 4 MG tablet Take 1 tablet (4 mg total) by mouth every 8 (eight) hours as needed.  30 tablet  0  . topiramate (TOPAMAX) 50 MG tablet Take 25 mg by mouth 3 (three) times daily.        Marland Kitchen DISCONTD: omeprazole (PRILOSEC) 20 MG capsule Take 20 mg by mouth daily.        Marland Kitchen DISCONTD: ondansetron (ZOFRAN) 4 MG tablet Take 4 mg by mouth every 8 (eight) hours as needed.        Marland Kitchen DISCONTD: dicloxacillin (DYNAPEN) 250 MG capsule Take 250 mg by mouth 4 (four) times daily.          Allergies as of 03/13/2011 - Review Complete 03/13/2011  Allergen Reaction Noted  . Aspirin    . Penicillins      Family History: There is no known family history of colorectal carcinoma , liver disease, or inflammatory bowel disease.  Problem  Relation Age of Onset  . Adopted: Yes    History   Social History  . Marital Status: Divorced    Spouse Name: N/A    Number of Children: 1  . Years of Education: N/A   Occupational History  . disabled    Social History Main Topics  . Smoking status: Current Everyday Smoker -- 0.5 packs/day    Types: Cigarettes  . Smokeless tobacco: Not on file  . Alcohol Use: No  . Drug Use: No  . Sexually Active: Not on file   Review of Systems: Gen: c/o  anorexia, fatigue, weakness, malaise.  Denies weight loss. CV: Denies chest pain, angina, palpitations, syncope, orthopnea, PND, peripheral edema, and claudication. Resp: Denies dyspnea at rest, dyspnea with exercise, cough, sputum, wheezing, coughing up blood, and pleurisy. GI: Denies vomiting blood, jaundice, and fecal incontinence.   Denies dysphagia or odynophagia. GU : Denies urinary burning, blood in urine, urinary frequency, urinary hesitancy, nocturnal urination, and urinary  incontinence. MS: Denies joint pain, limitation of movement, and swelling, stiffness, low back pain, extremity pain. Denies muscle weakness, cramps, atrophy.  Derm: Denies rash, itching, dry skin, hives, moles, warts, or unhealing ulcers.  Psych: Denies depression, anxiety, memory loss, suicidal ideation, hallucinations, paranoia, and confusion. Heme: Denies bruising, bleeding, and enlarged lymph nodes.  Physical Exam: BP 119/71  Pulse 77  Temp(Src) 97.8 F (36.6 C) (Temporal)  Ht 5\' 5"  (1.651 m)  Wt 159 lb 6.4 oz (72.303 kg)  BMI 26.53 kg/m2 General:   Alert,  Well-developed, well-nourished, pleasant and cooperative in NAD Head:  Normocephalic and atraumatic. Eyes:  Sclera clear, no icterus.   Conjunctiva pink. Ears:  Normal auditory acuity. Nose:  No deformity, discharge,  or lesions. Mouth:  No deformity or lesions, dentition poor. Neck:  Supple; no masses or thyromegaly. Lungs:  Clear throughout to auscultation.   No wheezes, crackles, or rhonchi. No acute distress. Heart:  Regular rate and rhythm; no murmurs, clicks, rubs,  or gallops. Abdomen:  Soft, nontender and nondistended. No masses, hepatosplenomegaly or hernias noted. Normal bowel sounds, without guarding, and without rebound.   Msk:  Symmetrical without gross deformities. Normal posture. Pulses:  Normal pulses noted. Extremities:  Without clubbing or edema. Neurologic:  Alert and  oriented x4;  grossly normal neurologically. Skin:  Intact without significant lesions or rashes. Cervical Nodes:  No significant cervical adenopathy. Psych:  Alert and cooperative. Normal mood and affect.

## 2011-03-14 ENCOUNTER — Emergency Department (HOSPITAL_COMMUNITY)
Admission: EM | Admit: 2011-03-14 | Discharge: 2011-03-14 | Disposition: A | Discharge status: Home / Self Care | Payer: PRIVATE HEALTH INSURANCE | Attending: Emergency Medicine | Admitting: Emergency Medicine

## 2011-03-14 ENCOUNTER — Telehealth: Payer: Self-pay

## 2011-03-14 DIAGNOSIS — R197 Diarrhea, unspecified: Secondary | ICD-10-CM

## 2011-03-14 DIAGNOSIS — Z8614 Personal history of Methicillin resistant Staphylococcus aureus infection: Secondary | ICD-10-CM | POA: Insufficient documentation

## 2011-03-14 DIAGNOSIS — F17200 Nicotine dependence, unspecified, uncomplicated: Secondary | ICD-10-CM | POA: Insufficient documentation

## 2011-03-14 DIAGNOSIS — R1011 Right upper quadrant pain: Secondary | ICD-10-CM | POA: Insufficient documentation

## 2011-03-14 LAB — URINE MICROSCOPIC-ADD ON

## 2011-03-14 LAB — COMPREHENSIVE METABOLIC PANEL
Albumin: 4.2 g/dL (ref 3.5–5.2)
BUN: 16 mg/dL (ref 6–23)
Calcium: 10 mg/dL (ref 8.4–10.5)
Creatinine, Ser: 0.56 mg/dL (ref 0.4–1.2)
Total Protein: 7.2 g/dL (ref 6.0–8.3)

## 2011-03-14 LAB — DIFFERENTIAL
Basophils Absolute: 0.1 10*3/uL (ref 0.0–0.1)
Eosinophils Relative: 4 % (ref 0–5)
Monocytes Relative: 8 % (ref 3–12)
Neutrophils Relative %: 63 % (ref 43–77)

## 2011-03-14 LAB — CBC
HCT: 37.8 % (ref 36.0–46.0)
MCV: 88.7 fL (ref 78.0–100.0)
RBC: 4.26 MIL/uL (ref 3.87–5.11)
WBC: 9.7 10*3/uL (ref 4.0–10.5)

## 2011-03-14 LAB — URINALYSIS, ROUTINE W REFLEX MICROSCOPIC
Glucose, UA: NEGATIVE mg/dL
Ketones, ur: NEGATIVE mg/dL
Leukocytes, UA: NEGATIVE
Specific Gravity, Urine: 1.025 (ref 1.005–1.030)
pH: 6 (ref 5.0–8.0)

## 2011-03-14 NOTE — Telephone Encounter (Signed)
Pt called and said her nausea is worsening. Zofran not working as well. Scheduled for EGD on 03/23/2011. York Spaniel was there anything else she could take for the nausea. She has taken phenergan in the past that helped. She also asked about Reglan. She has had that IV before when she was inpatient. Please advise! ( Uses [facility])

## 2011-03-14 NOTE — Telephone Encounter (Signed)
Pt called back and told Darl Pikes she was going to the hospital the nausea was so bad.

## 2011-03-14 NOTE — Telephone Encounter (Signed)
Noted  

## 2011-03-14 NOTE — Progress Notes (Signed)
Cc to PCP 

## 2011-03-15 ENCOUNTER — Other Ambulatory Visit: Payer: Self-pay | Admitting: Gastroenterology

## 2011-03-15 ENCOUNTER — Encounter: Payer: Self-pay | Admitting: Urgent Care

## 2011-03-15 NOTE — Telephone Encounter (Signed)
Pt scheduled for CT scan on 03/16/11 @ 4- pt was instructed to go by and pick up her contrast and instructions- pt stated her understanding

## 2011-03-15 NOTE — Telephone Encounter (Signed)
CT abd/pelvis w/ IV/oral contrast [id number] abd pain, nausea, vomiting as soon as possible Cannot give her pain meds until we determine cause of her problems Phenergan 25mg  supp, 1PR q8hrs prn N/V, #10, 0RF

## 2011-03-15 NOTE — Telephone Encounter (Signed)
Pt was informed of the stool studies needed. She will try to get the specimen bottles/orders or send someone to get them.

## 2011-03-15 NOTE — Telephone Encounter (Signed)
Pt called back and asked that the suppositories be called to [facility]. Called to [employer] @ the pharmacy.

## 2011-03-15 NOTE — Telephone Encounter (Signed)
Pt informed. Declined phenergan suppositories, said she has had diarrhea for the past couple of days, just like water. Said she is drinking [employer], but not much help. Said they tried to start IV at the hospital but was unable to, and she is feeling dehydrated. OK to schedule the CT.

## 2011-03-15 NOTE — Progress Notes (Signed)
Past Medical History  Diagnosis Date  . S/P colonoscopy 06/02/09    normal (Dr. Linna Darner)  . PUD (peptic ulcer disease) 01/2007    EGD Dr Jena Gauss, 2 antral ulcers, negative h pylori  . MRSA infection     Dr. Lenice Pressman, currently under treatment  . Hiatal hernia 2008    on EGD above  . Asthma   . PTSD (post-traumatic stress disorder)   . Migraines   . Bipolar affective disorder   . Degenerative disc disease   . Rectal prolapse   . SBO (small bowel obstruction)   . ADHD (attention deficit hyperactivity disorder)    Colonoscopy updated

## 2011-03-15 NOTE — Telephone Encounter (Signed)
LMOM for pt to call. Told her i will be at lunch, to please ask for Raynelle Fanning or Ginger if I am away at lunch.

## 2011-03-15 NOTE — Telephone Encounter (Signed)
Pt called and said she went to the ED yesterday. Was having abd pain and nausea. She got a shot and some meds there. Feels some better today. Wants to know what she can do for the upper right quad pain that she has until her procedure  On 03/23/2011. She is taking the Omeprazole bid and the Zofran. Nausea is some better since she went to the hospital. Please advise! Uses [facility].

## 2011-03-15 NOTE — Telephone Encounter (Signed)
Pt needs c diff pcr, stool cx, lactoferrin, o&p [id number] Plenty fluids

## 2011-03-16 ENCOUNTER — Other Ambulatory Visit: Payer: Self-pay | Admitting: Gastroenterology

## 2011-03-16 ENCOUNTER — Ambulatory Visit (HOSPITAL_COMMUNITY)
Admission: RE | Admit: 2011-03-16 | Discharge: 2011-03-16 | Disposition: A | Discharge status: Home / Self Care | Payer: PRIVATE HEALTH INSURANCE | Source: Ambulatory Visit | Attending: Gastroenterology | Admitting: Gastroenterology

## 2011-03-16 ENCOUNTER — Encounter (HOSPITAL_COMMUNITY): Payer: Self-pay

## 2011-03-16 DIAGNOSIS — R109 Unspecified abdominal pain: Secondary | ICD-10-CM | POA: Insufficient documentation

## 2011-03-17 ENCOUNTER — Emergency Department (HOSPITAL_COMMUNITY)
Admission: EM | Admit: 2011-03-17 | Discharge: 2011-03-17 | Disposition: A | Discharge status: Home / Self Care | Payer: PRIVATE HEALTH INSURANCE | Attending: Emergency Medicine | Admitting: Emergency Medicine

## 2011-03-17 ENCOUNTER — Emergency Department (HOSPITAL_COMMUNITY): Payer: PRIVATE HEALTH INSURANCE

## 2011-03-17 DIAGNOSIS — R197 Diarrhea, unspecified: Secondary | ICD-10-CM | POA: Insufficient documentation

## 2011-03-17 DIAGNOSIS — K219 Gastro-esophageal reflux disease without esophagitis: Secondary | ICD-10-CM | POA: Insufficient documentation

## 2011-03-17 DIAGNOSIS — Z9049 Acquired absence of other specified parts of digestive tract: Secondary | ICD-10-CM | POA: Insufficient documentation

## 2011-03-17 DIAGNOSIS — R112 Nausea with vomiting, unspecified: Secondary | ICD-10-CM | POA: Insufficient documentation

## 2011-03-17 DIAGNOSIS — R109 Unspecified abdominal pain: Secondary | ICD-10-CM | POA: Insufficient documentation

## 2011-03-17 LAB — BASIC METABOLIC PANEL
BUN: 18 mg/dL (ref 6–23)
CO2: 26 mEq/L (ref 19–32)
Chloride: 104 mEq/L (ref 96–112)
Creatinine, Ser: 0.65 mg/dL (ref 0.4–1.2)
GFR calc Af Amer: >60 mL/min (ref >60)
Potassium: 3.9 mEq/L (ref 3.5–5.1)

## 2011-03-17 LAB — DIFFERENTIAL
Basophils Absolute: 0 10*3/uL (ref 0.0–0.1)
Lymphocytes Relative: 32 % (ref 12–46)
Lymphs Abs: 2.6 10*3/uL (ref 0.7–4.0)
Neutro Abs: 4.2 10*3/uL (ref 1.7–7.7)
Neutrophils Relative %: 53 % (ref 43–77)

## 2011-03-17 LAB — CBC
HCT: 36.3 % (ref 36.0–46.0)
Hemoglobin: 11.9 g/dL — ABNORMAL LOW (ref 12.0–15.0)
MCV: 89 fL (ref 78.0–100.0)
RBC: 4.08 MIL/uL (ref 3.87–5.11)
RDW: 14.4 % (ref 11.5–15.5)
WBC: 7.9 10*3/uL (ref 4.0–10.5)

## 2011-03-23 ENCOUNTER — Ambulatory Visit (HOSPITAL_COMMUNITY)
Admission: RE | Admit: 2011-03-23 | Discharge: 2011-03-23 | Disposition: A | Discharge status: Home / Self Care | Payer: PRIVATE HEALTH INSURANCE | Source: Ambulatory Visit | Attending: Gastroenterology | Admitting: Gastroenterology

## 2011-03-23 ENCOUNTER — Encounter: Payer: PRIVATE HEALTH INSURANCE | Admitting: Gastroenterology

## 2011-03-23 ENCOUNTER — Other Ambulatory Visit: Payer: Self-pay | Admitting: Gastroenterology

## 2011-03-23 DIAGNOSIS — R197 Diarrhea, unspecified: Secondary | ICD-10-CM | POA: Insufficient documentation

## 2011-03-23 DIAGNOSIS — R109 Unspecified abdominal pain: Secondary | ICD-10-CM | POA: Insufficient documentation

## 2011-03-23 DIAGNOSIS — K2990 Gastroduodenitis, unspecified, without bleeding: Secondary | ICD-10-CM

## 2011-03-23 DIAGNOSIS — K2970 Gastritis, unspecified, without bleeding: Secondary | ICD-10-CM

## 2011-03-23 DIAGNOSIS — K449 Diaphragmatic hernia without obstruction or gangrene: Secondary | ICD-10-CM | POA: Insufficient documentation

## 2011-03-23 DIAGNOSIS — K319 Disease of stomach and duodenum, unspecified: Secondary | ICD-10-CM | POA: Insufficient documentation

## 2011-03-23 HISTORY — PX: ESOPHAGOGASTRODUODENOSCOPY: SHX1529

## 2011-03-23 NOTE — Progress Notes (Signed)
Chronic abd pain/nausea 2o to non-ulcer dyspepsia, needs Pain clinic referral for peri-incision pain, chronic abd pain. Evaluate for pain management and abd wall injections.

## 2011-03-26 DIAGNOSIS — Z9889 Other specified postprocedural states: Secondary | ICD-10-CM

## 2011-03-26 HISTORY — DX: Other specified postprocedural states: Z98.890

## 2011-03-26 NOTE — Progress Notes (Signed)
Notes faxed to Dr Gerilyn Pilgrim

## 2011-03-29 NOTE — Progress Notes (Signed)
Notes faxed to [facility]

## 2011-03-30 NOTE — Telephone Encounter (Signed)
noncompliant

## 2011-03-30 NOTE — Progress Notes (Signed)
Cc path to PCP 

## 2011-04-04 NOTE — Op Note (Signed)
NAME:  [name], CRIBB NO.:  [id number]  MEDICAL RECORD NO.:  [id number]  LOCATION:  [facility]                          FACILITY:  [facility]  PHYSICIAN:  Jonette Eva, M.D.     DATE OF BIRTH:  Dec 29, 1960  DATE OF PROCEDURE:  03/23/2011 DATE OF DISCHARGE:                              OPERATIVE REPORT   REFERRING PROVIDER:  Elby Showers, MD, in the office with Dr. Oval Linsey.  PROCEDURE:  Esophagogastroduodenoscopy with cold forceps biopsy OF THE gastric mucosa.  INDICATION FOR EXAMINATION:  [name] is a 50 year old female who has a longstanding history of chronic abdominal pain.  She was last seen and evaluated by me in December 2009 for diarrhea, constipation, and nausea. She does have a significant past medical history of rectal prolapse and bowel resection secondary to obstruction from adhesions.  She has a known history of irritable bowel syndrome and gastroesophageal reflux disease.  In December 2009, she weighed 127 pounds.  She was seen as an inpatient by Dr. Charna Elizabeth in December 2009.  Since 2009, she has had 6 CT scans of the abdomen and pelvis.  Her last evaluation was in June 2012 for severe right upper quadrant pain.  The examination was without contrast.  She had no evidence of small bowel obstruction.  She had no acute intraabdominal process.  She also had a lab evaluation which showed a hemoglobin of 11.9, platelets of 314, mildly elevated lipase of 65, and a complete metabolic panel that showed normal creatinine and hepatic function panel.  Her albumin is 4.2.  Her urinalysis was normal.  She presents complaining of pain in her peri-incisional area.  She does have a history of using ibuprofen and aspirin.  She did not receive symptomatic relief with omeprazole twice a day or Dexilant.  Phenergan helps for her nausea.  FINDINGS: 1. Normal esophagus without evidence of Barrett's, mass, erosions, or     ulcerations. 2. Patchy erythema  with occasional erosion in the antrum.  Biopsies     obtained via cold forceps to evaluate for H. pylori gastritis. 3. Small hiatal hernia. 4. Normal duodenal bulb and second portion of the duodenum.  DIAGNOSES: 1. Mild abdominal pain, likely secondary to gastritis. 2. Peri-incisional pain. 3. Small hiatal hernia.  RECOMMENDATIONS: 1. The patient is to stop the omeprazole.  She should start Aciphex 20     mg every morning.  She will also begin a prescription for Phenergan     rectal suppositories per her request to use every 4-6 hours as     needed for nausea and vomiting. 2. We will call with the results of her biopsies. 3. She can follow up in 3 months regarding her abdominal pain. 4. Recommend pain clinic referral for pain management as well as     consider abdominal wall injections. 5. She is given a handout on gastritis and hiatal hernia. 6. NEXT ENDOSCOPY WITH PROPOFOL.  MEDICATIONS: 1. Demerol 125 mg IV. 2. Versed 7 mg IV. 3. Phenergan 25 mg IV.  PROCEDURE TECHNIQUE:  Physical exam was performed and informed consent was obtained from the patient after explaining the benefits, risks, and alternatives to the procedure.  The patient was connected to the monitor and placed in the left lateral position.  Continuous oxygen was provided by nasal cannula and IV medicine administered through an indwelling cannula.  After administration of sedation, the patient's esophagus was intubated and the scope was advanced under direct visualization to the second portion of the duodenum.  The patient was extremely agitated and needed to be gently restrained during the procedure.  The scope was removed slowly by careful examining the color, texture, anatomy, and integrity of mucosa on the way out.  The patient was recovered in Endoscopy and discharged home in satisfactory condition.  The patient requested Percocet and I declined to give her a prescription.  I explained we did not give  narcotics for chronic abdominal pain.  PATH: GASTRITIS   Jonette Eva, M.D.     SF/MEDQ  D:  03/23/2011  T:  03/24/2011  Job:  [id number]  cc:   Melvyn Novas, MD Fax: [phone]  Elby Showers, MD Fax: [phone]  Electronically Signed by Jonette Eva M.D. on 04/04/2011 02:55:46 PM

## 2011-04-05 ENCOUNTER — Other Ambulatory Visit (HOSPITAL_COMMUNITY): Payer: Self-pay | Admitting: Specialist

## 2011-04-05 DIAGNOSIS — M542 Cervicalgia: Secondary | ICD-10-CM

## 2011-04-09 ENCOUNTER — Other Ambulatory Visit: Payer: Self-pay | Admitting: Urgent Care

## 2011-04-09 ENCOUNTER — Telehealth: Payer: Self-pay

## 2011-04-09 ENCOUNTER — Other Ambulatory Visit: Payer: Self-pay

## 2011-04-09 DIAGNOSIS — K625 Hemorrhage of anus and rectum: Secondary | ICD-10-CM

## 2011-04-09 NOTE — Telephone Encounter (Signed)
Return stool studies as ordered several weeks ago! NO NSAIDS! Add H/H to labs [id number] bleeding Does pt have appt w/ pain clinic?

## 2011-04-09 NOTE — Telephone Encounter (Signed)
Pt informed. She will try to do the stools soon. The lab order was faxed to [facility].

## 2011-04-09 NOTE — Telephone Encounter (Signed)
Pt did say that she has had an appt at the pain clinic and has another one soon.

## 2011-04-09 NOTE — Telephone Encounter (Signed)
Pt left VM that she has been very nauseated and she is having some diarrhea  That looks very tarry. She said she thinks she is passing blood. She took phenergan for nausea and it helped. She has not been able to do the stool tests yet because she was sick. Please advise!

## 2011-04-10 ENCOUNTER — Ambulatory Visit (HOSPITAL_COMMUNITY)
Admission: RE | Admit: 2011-04-10 | Discharge: 2011-04-10 | Disposition: A | Discharge status: Home / Self Care | Payer: PRIVATE HEALTH INSURANCE | Source: Ambulatory Visit | Attending: Specialist | Admitting: Specialist

## 2011-04-10 ENCOUNTER — Telehealth: Payer: Self-pay

## 2011-04-10 DIAGNOSIS — M542 Cervicalgia: Secondary | ICD-10-CM | POA: Insufficient documentation

## 2011-04-10 DIAGNOSIS — M5380 Other specified dorsopathies, site unspecified: Secondary | ICD-10-CM | POA: Insufficient documentation

## 2011-04-10 DIAGNOSIS — M5020 Other cervical disc displacement, unspecified cervical region: Secondary | ICD-10-CM | POA: Insufficient documentation

## 2011-04-10 NOTE — Telephone Encounter (Signed)
Pt called and was informed.  

## 2011-04-10 NOTE — Telephone Encounter (Signed)
Needs to address w/ Pain Clinic. She should get all her meds from there without exception.

## 2011-04-10 NOTE — Telephone Encounter (Signed)
LMOM to call.

## 2011-04-10 NOTE — Telephone Encounter (Signed)
Pt called and requested Tramadol for pain. Said she took her stool samples in and had blood work done. Wants to know if she can have something for pain until she gets those results back.

## 2011-04-11 LAB — FECAL LACTOFERRIN, QUANT: Lactoferrin: NEGATIVE

## 2011-04-12 ENCOUNTER — Telehealth: Payer: Self-pay | Admitting: Urgent Care

## 2011-04-12 MED ORDER — METRONIDAZOLE 500 MG PO TABS: 500.0000 mg | ORAL_TABLET | Freq: Three times a day (TID) | ORAL | Status: AC

## 2011-04-12 NOTE — Telephone Encounter (Signed)
Please call pt- she has c diff colitis to explain her diarrhea, nausea & abd pain Probably caused by previous antibiotics she was on for infection Verify that she is no longer on any antibiotics Take all flagyl as directed Align 1 po daily (not within 2 hrs of flagyl) Plenty fluids Wash hands & give instructions to clean bathrooms etc w/ bleach Give c diff handout OV 4 weeks or call sooner if no better Thanks

## 2011-04-12 NOTE — Telephone Encounter (Signed)
Pt informed. Will start Flagyl. (not taking antibiotics at present)

## 2011-04-23 ENCOUNTER — Encounter (HOSPITAL_COMMUNITY): Payer: Self-pay | Admitting: *Deleted

## 2011-04-23 ENCOUNTER — Emergency Department (HOSPITAL_COMMUNITY)
Admission: EM | Admit: 2011-04-23 | Discharge: 2011-04-23 | Disposition: A | Discharge status: Home / Self Care | Payer: PRIVATE HEALTH INSURANCE | Attending: Emergency Medicine | Admitting: Emergency Medicine

## 2011-04-23 DIAGNOSIS — R040 Epistaxis: Secondary | ICD-10-CM

## 2011-04-23 DIAGNOSIS — F17200 Nicotine dependence, unspecified, uncomplicated: Secondary | ICD-10-CM | POA: Insufficient documentation

## 2011-04-23 DIAGNOSIS — J3489 Other specified disorders of nose and nasal sinuses: Secondary | ICD-10-CM | POA: Insufficient documentation

## 2011-04-23 DIAGNOSIS — R0982 Postnasal drip: Secondary | ICD-10-CM | POA: Insufficient documentation

## 2011-04-23 MED ORDER — HYDROCODONE-ACETAMINOPHEN 5-325 MG PO TABS: 1.0000 | ORAL_TABLET | ORAL | Status: AC | PRN

## 2011-04-23 MED ORDER — HYDROCODONE-ACETAMINOPHEN 5-325 MG PO TABS
1.0000 | ORAL_TABLET | Freq: Once | ORAL | Status: AC
  Administered 2011-04-23: 1 via ORAL
  Filled 2011-04-23: qty 1

## 2011-04-23 MED ORDER — OXYMETAZOLINE HCL 0.05 % NA SOLN
1.0000 | Freq: Once | NASAL | Status: AC
  Administered 2011-04-23: 1 via NASAL
  Filled 2011-04-23: qty 15

## 2011-04-23 NOTE — ED Notes (Signed)
Pt states nosebleed x 8 since 0300. Pt states she is due to have surgery on nose soon. NAD at this time. Not bleeding at this time. Last bled at ~1130

## 2011-04-26 NOTE — ED Provider Notes (Signed)
History     Chief Complaint  Patient presents with  . Epistaxis   Patient is a 50 y.o. female presenting with nosebleeds. The history is provided by the patient.  Epistaxis  This is a recurrent problem. The current episode started 12 to 24 hours ago. The problem has been resolved. The bleeding has been from the left nare. She has tried applying pressure for the symptoms. The treatment provided significant relief. Her past medical history is significant for sinus problems and frequent nosebleeds. Past medical history comments: She has chronic sinus pain and history of sinus surgery with need for repair of her septum which is scheduled with Dr. Lazarus Salines next month..    Past Medical History  Diagnosis Date  . S/P colonoscopy 06/02/09    normal (Dr. Linna Darner)  . PUD (peptic ulcer disease) 01/2007    EGD Dr Jena Gauss, 2 antral ulcers, negative h pylori  . MRSA infection     Dr. Lenice Pressman, currently under treatment  . Hiatal hernia 2008    on EGD above  . Asthma   . PTSD (post-traumatic stress disorder)   . Migraines   . Bipolar affective disorder   . Degenerative disc disease   . Rectal prolapse   . SBO (small bowel obstruction)   . ADHD (attention deficit hyperactivity disorder)     Past Surgical History  Procedure Date  . Bowel resection     x2, secondary to adhesions  . Appendectomy   . Laparoscopic lysis intestinal adhesions   . Back surgery   . Abdominal hysterectomy   . Tubal ligation   . Umbilical hernia repair     x5    Family History  Problem Relation Age of Onset  . Adopted: Yes    History  Substance Use Topics  . Smoking status: Current Everyday Smoker -- 0.5 packs/day    Types: Cigarettes  . Smokeless tobacco: Not on file  . Alcohol Use: No    OB History    Grav Para Term Preterm Abortions TAB SAB Ect Mult Living                  Review of Systems  Constitutional: Negative for fever.  HENT: Positive for nosebleeds, congestion, rhinorrhea and  postnasal drip. Negative for sore throat and neck pain.   Eyes: Negative.   Respiratory: Negative for chest tightness and shortness of breath.   Cardiovascular: Negative for chest pain.  Gastrointestinal: Negative for nausea and abdominal pain.  Genitourinary: Negative.   Musculoskeletal: Negative for joint swelling and arthralgias.  Skin: Negative.  Negative for rash and wound.  Neurological: Negative for dizziness, weakness, light-headedness, numbness and headaches.  Hematological: Negative.   Psychiatric/Behavioral: Negative.     Physical Exam  BP 142/80  Pulse 75  Temp(Src) 98.2 F (36.8 C) (Oral)  Resp 18  Ht 5\' 5"  (1.651 m)  Wt 155 lb (70.308 kg)  BMI 25.79 kg/m2  SpO2 98%  Physical Exam  Vitals reviewed. Constitutional: She is oriented to person, place, and time. She appears well-developed and well-nourished.  HENT:  Head: Normocephalic and atraumatic.  Right Ear: External ear normal.  Left Ear: External ear normal.  Nose: Epistaxis is observed. Left sinus exhibits frontal sinus tenderness.  Mouth/Throat: Oropharynx is clear and moist.       Dried blood in left nare with no active bleeding identified.  Eyes: Conjunctivae are normal.  Neck: Normal range of motion.  Cardiovascular: Normal rate, regular rhythm, normal heart sounds and intact distal pulses.  Pulmonary/Chest: Effort normal and breath sounds normal. She has no wheezes.  Abdominal: Soft. Bowel sounds are normal. There is no tenderness.  Musculoskeletal: Normal range of motion.  Neurological: She is alert and oriented to person, place, and time.  Skin: Skin is warm and dry.  Psychiatric: She has a normal mood and affect.    ED Course  Procedures  MDM        Candis Musa, [place] 04/26/11 2248

## 2011-04-28 NOTE — ED Provider Notes (Signed)
Medical screening examination/treatment/procedure(s) were performed by non-physician practitioner and as supervising physician I was immediately available for consultation/collaboration. Devoria Albe, MD, Armando Gang   Ward Givens, MD 04/28/11 [phone]

## 2011-05-01 ENCOUNTER — Encounter (HOSPITAL_COMMUNITY): Payer: Self-pay

## 2011-05-01 ENCOUNTER — Emergency Department (HOSPITAL_COMMUNITY)
Admission: EM | Admit: 2011-05-01 | Discharge: 2011-05-01 | Disposition: A | Discharge status: Home / Self Care | Payer: PRIVATE HEALTH INSURANCE | Attending: Emergency Medicine | Admitting: Emergency Medicine

## 2011-05-01 DIAGNOSIS — G43909 Migraine, unspecified, not intractable, without status migrainosus: Secondary | ICD-10-CM | POA: Insufficient documentation

## 2011-05-01 DIAGNOSIS — R040 Epistaxis: Secondary | ICD-10-CM

## 2011-05-01 DIAGNOSIS — J329 Chronic sinusitis, unspecified: Secondary | ICD-10-CM

## 2011-05-01 DIAGNOSIS — F4310 Post-traumatic stress disorder, unspecified: Secondary | ICD-10-CM | POA: Insufficient documentation

## 2011-05-01 DIAGNOSIS — K279 Peptic ulcer, site unspecified, unspecified as acute or chronic, without hemorrhage or perforation: Secondary | ICD-10-CM | POA: Insufficient documentation

## 2011-05-01 DIAGNOSIS — J45909 Unspecified asthma, uncomplicated: Secondary | ICD-10-CM | POA: Insufficient documentation

## 2011-05-01 DIAGNOSIS — F17200 Nicotine dependence, unspecified, uncomplicated: Secondary | ICD-10-CM | POA: Insufficient documentation

## 2011-05-01 DIAGNOSIS — F909 Attention-deficit hyperactivity disorder, unspecified type: Secondary | ICD-10-CM | POA: Insufficient documentation

## 2011-05-01 MED ORDER — HYDROCODONE-ACETAMINOPHEN 5-325 MG PO TABS: 2.0000 | ORAL_TABLET | ORAL | Status: AC | PRN

## 2011-05-01 NOTE — ED Notes (Signed)
Pt says had sinus surgery last October.  Says has been having frequent nosebleeds since.   Has been using afrin for nosebleeds.  Pt says sniffed last night and felt something go down back of throat.  Reports coughed and it came out.  Pt says it looked like cartilage.  C/O pain under left eye and bridge of nose.

## 2011-05-01 NOTE — ED Provider Notes (Signed)
History     Chief Complaint  Patient presents with  . Epistaxis   HPI  Past Medical History  Diagnosis Date  . S/P colonoscopy 06/02/09    normal (Dr. Linna Darner)  . PUD (peptic ulcer disease) 01/2007    EGD Dr Jena Gauss, 2 antral ulcers, negative h pylori  . MRSA infection     Dr. Lenice Pressman, currently under treatment  . Hiatal hernia 2008    on EGD above  . Asthma   . PTSD (post-traumatic stress disorder)   . Migraines   . Degenerative disc disease   . Rectal prolapse   . SBO (small bowel obstruction)   . ADHD (attention deficit hyperactivity disorder)     Past Surgical History  Procedure Date  . Bowel resection     x2, secondary to adhesions  . Appendectomy   . Laparoscopic lysis intestinal adhesions   . Back surgery   . Abdominal hysterectomy   . Tubal ligation   . Umbilical hernia repair     x5  . Neck surgery     Family History  Problem Relation Age of Onset  . Adopted: Yes    History  Substance Use Topics  . Smoking status: Current Everyday Smoker -- 0.5 packs/day    Types: Cigarettes  . Smokeless tobacco: Not on file  . Alcohol Use: No    OB History    Grav Para Term Preterm Abortions TAB SAB Ect Mult Living                  Review of Systems  Physical Exam  BP 126/88  Pulse 79  Temp(Src) 98.7 F (37.1 C) (Oral)  Resp 20  Ht 5\' 5"  (1.651 m)  Wt 155 lb (70.308 kg)  BMI 25.79 kg/m2  SpO2 97%  Physical Exam  ED Course  Procedures  MDM Eval. C/w chronic sinusitis and pain d/t congestion. Doubt acute infection.      Flint Melter, MD 05/05/11 [phone]

## 2011-05-01 NOTE — ED Provider Notes (Signed)
History     Chief Complaint  Patient presents with  . Epistaxis   Patient is a 50 y.o. female presenting with nosebleeds. The history is provided by the patient. No language interpreter was used.  Epistaxis  This is a chronic problem. Episode onset: several months ago but worse last night. The problem has been gradually improving. The problem is associated with an unknown factor. Treatments tried: Afrin, Netipot. The treatment provided no relief. Her past medical history is significant for sinus problems and frequent nosebleeds.  Patient c/o persistent nose bleeds onset several months ago but worse last night with associated sinus pain. Patient states she used a Netipot last night and afterwards felt "something hanging" within the back of her nose and "tickling" her nose. Patient notes she swallowed to try to get the hanging object out of nose and felt the object move to her throat. Patient then reports she spit the object out and found a hard yellow object which she describes as similar to cartilage. States epistaxis has been worse since object was removed from her nose. Denies fever, sore throat, ear pain, SOB, cough and vomiting. Patient reports she had surgery performed on her sinuses several months ago and nose bleeds have been frequent since. Reports she has an appointment scheduled with Dr. Lenon Curt on May 22, 2011 regarding the persistent nose bleeds. Reports history of MRSA infection of sinuses which she recently finished a round of antibiotics for several weeks ago. Also notes history of C-diff which was treated with Flagyl.   Past Medical History  Diagnosis Date  . S/P colonoscopy 06/02/09    normal (Dr. Linna Darner)  . PUD (peptic ulcer disease) 01/2007    EGD Dr Jena Gauss, 2 antral ulcers, negative h pylori  . MRSA infection     Dr. Lenice Pressman, currently under treatment  . Hiatal hernia 2008    on EGD above  . Asthma   . PTSD (post-traumatic stress disorder)   . Migraines   .  Degenerative disc disease   . Rectal prolapse   . SBO (small bowel obstruction)   . ADHD (attention deficit hyperactivity disorder)     Past Surgical History  Procedure Date  . Bowel resection     x2, secondary to adhesions  . Appendectomy   . Laparoscopic lysis intestinal adhesions   . Back surgery   . Abdominal hysterectomy   . Tubal ligation   . Umbilical hernia repair     x5  . Neck surgery     Family History  Problem Relation Age of Onset  . Adopted: Yes    History  Substance Use Topics  . Smoking status: Current Everyday Smoker -- 0.5 packs/day    Types: Cigarettes  . Smokeless tobacco: Not on file  . Alcohol Use: No    OB History    Grav Para Term Preterm Abortions TAB SAB Ect Mult Living                  Review of Systems  Constitutional: Negative for fever and chills.  HENT: Positive for nosebleeds and sinus pressure. Negative for ear pain, sore throat and neck pain.   Respiratory: Negative for cough and shortness of breath.   Gastrointestinal: Negative for nausea and vomiting.  All other systems reviewed and are negative.    Physical Exam  BP 126/88  Pulse 79  Temp(Src) 98.7 F (37.1 C) (Oral)  Resp 20  Ht 5\' 5"  (1.651 m)  Wt 155 lb (70.308 kg)  BMI  25.79 kg/m2  SpO2 97%  Physical Exam  Nursing note and vitals reviewed. Constitutional: She is oriented to person, place, and time. She appears well-developed and well-nourished. No distress.  HENT:  Head: Normocephalic and atraumatic.  Mouth/Throat: Uvula is midline, oropharynx is clear and moist and mucous membranes are normal.       Dry blood and small amount of d/c from middle turbinate of nose. Previous tonsillectomy performed.   Eyes: Conjunctivae and EOM are normal. Pupils are equal, round, and reactive to light.  Neck: Normal range of motion and phonation normal. Neck supple.  Cardiovascular: Normal rate, regular rhythm and intact distal pulses.   Pulmonary/Chest: Effort normal and  breath sounds normal. She exhibits no tenderness.  Musculoskeletal: Normal range of motion.  Neurological: She is alert and oriented to person, place, and time. She has normal strength and normal reflexes. She exhibits normal muscle tone.  Skin: Skin is warm and dry.  Psychiatric: She has a normal mood and affect. Her behavior is normal. Judgment and thought content normal.    ED Course  Procedures  MDM Eval. C/w chronic sinusitis. No indication for ABX at this time. Pt has short-term f/u with her ENT.    Chart written by Clarita Crane acting as scribe for Flint Melter, MD  I personally performed the services described in this documentation, which was scribed in my presence. The recorded information has been reviewed and considered.   Flint Melter, MD 05/03/11 1014

## 2011-05-01 NOTE — ED Notes (Signed)
Pt c/o nosebleed, pain to left side of face at sinus area, no active bleeding noted, pt states tha t she has had problems with nosebleeds on her left side since having nose surgery this past October, pt states that she coughed up what she thinks is a piece of " cartilage" from her nose,

## 2011-05-09 NOTE — Telephone Encounter (Signed)
Pt is aware of OV for 9/17 at 0800 with AS

## 2011-05-18 ENCOUNTER — Emergency Department (HOSPITAL_COMMUNITY)
Admission: EM | Admit: 2011-05-18 | Discharge: 2011-05-18 | Disposition: A | Discharge status: Home / Self Care | Payer: PRIVATE HEALTH INSURANCE | Attending: Emergency Medicine | Admitting: Emergency Medicine

## 2011-05-18 DIAGNOSIS — IMO0002 Reserved for concepts with insufficient information to code with codable children: Secondary | ICD-10-CM | POA: Insufficient documentation

## 2011-05-18 DIAGNOSIS — K219 Gastro-esophageal reflux disease without esophagitis: Secondary | ICD-10-CM | POA: Insufficient documentation

## 2011-05-24 ENCOUNTER — Telehealth: Payer: Self-pay

## 2011-05-24 ENCOUNTER — Emergency Department (HOSPITAL_COMMUNITY)
Admission: EM | Admit: 2011-05-24 | Discharge: 2011-05-24 | Disposition: A | Discharge status: Home / Self Care | Payer: PRIVATE HEALTH INSURANCE | Attending: Emergency Medicine | Admitting: Emergency Medicine

## 2011-05-24 ENCOUNTER — Encounter (HOSPITAL_COMMUNITY): Payer: Self-pay

## 2011-05-24 DIAGNOSIS — M79609 Pain in unspecified limb: Secondary | ICD-10-CM | POA: Insufficient documentation

## 2011-05-24 DIAGNOSIS — M79621 Pain in right upper arm: Secondary | ICD-10-CM

## 2011-05-24 DIAGNOSIS — K279 Peptic ulcer, site unspecified, unspecified as acute or chronic, without hemorrhage or perforation: Secondary | ICD-10-CM | POA: Insufficient documentation

## 2011-05-24 DIAGNOSIS — F17200 Nicotine dependence, unspecified, uncomplicated: Secondary | ICD-10-CM | POA: Insufficient documentation

## 2011-05-24 DIAGNOSIS — K449 Diaphragmatic hernia without obstruction or gangrene: Secondary | ICD-10-CM | POA: Insufficient documentation

## 2011-05-24 DIAGNOSIS — J45909 Unspecified asthma, uncomplicated: Secondary | ICD-10-CM | POA: Insufficient documentation

## 2011-05-24 DIAGNOSIS — F909 Attention-deficit hyperactivity disorder, unspecified type: Secondary | ICD-10-CM | POA: Insufficient documentation

## 2011-05-24 MED ORDER — CELECOXIB 200 MG PO CAPS: 200.0000 mg | ORAL_CAPSULE | Freq: Two times a day (BID) | ORAL | Status: AC

## 2011-05-24 NOTE — Telephone Encounter (Signed)
Called pt. LVM call me at [phone] to discuss. Pt CDIFF pos JUL 2012. Need FLAGYl during Abx plus 10 days to prevent relapse. Start [facility] probiotics daily.

## 2011-05-24 NOTE — ED Notes (Signed)
Pt has boil in her rt axillary.  Pt states that she has been taking tramadol and ibuprofen at home w/out relief.  Area is raised and red.  Pt reports having it drained at [facility] last Tuesday but did not take her prescribed antibiotics.  nad noted

## 2011-05-24 NOTE — Telephone Encounter (Signed)
Pt left Vm, she is at the ED for a boil under her arm, has nercer and needs to take antibiotics. She fears taking them since she had C-diff. Wants some advice as to what she should do. She  can be reached at [phone].

## 2011-05-24 NOTE — ED Notes (Signed)
Pt reports having a boil under her rt arm drained at [facility] last Tuesday.  Pt was prescribed an antibiotic but did not fill it.  Pt noticed last night that the boil has gotten bigger and more painful.  nad noted

## 2011-05-24 NOTE — ED Provider Notes (Signed)
History     CSN: [id number] Arrival date & time: 05/24/2011 11:48 AM  Chief Complaint  Patient presents with  . Recurrent Skin Infections   Patient is a 50 y.o. female presenting with rash. The history is provided by the patient.  Rash  This is a new problem. The current episode started more than 1 week ago. The problem has been gradually worsening. Associated with: She had an abscess lanced in her right axilla 9 days ago and has developed worse pain. There has been no fever. The pain is at a severity of 8/10. The pain is severe. The pain has been constant since onset. Associated symptoms include pain. Pertinent negatives include no blisters and no itching. She has tried OTC analgesics (tramadol) for the symptoms. The treatment provided no relief.    Past Medical History  Diagnosis Date  . S/P colonoscopy 06/02/09    normal (Dr. Linna Darner)  . PUD (peptic ulcer disease) 01/2007    EGD Dr Jena Gauss, 2 antral ulcers, negative h pylori  . MRSA infection     Dr. Lenice Pressman, currently under treatment  . Hiatal hernia 2008    on EGD above  . Asthma   . PTSD (post-traumatic stress disorder)   . Migraines   . Degenerative disc disease   . Rectal prolapse   . SBO (small bowel obstruction)   . ADHD (attention deficit hyperactivity disorder)     Past Surgical History  Procedure Date  . Bowel resection     x2, secondary to adhesions  . Appendectomy   . Laparoscopic lysis intestinal adhesions   . Back surgery   . Abdominal hysterectomy   . Tubal ligation   . Umbilical hernia repair     x5  . Neck surgery     Family History  Problem Relation Age of Onset  . Adopted: Yes    History  Substance Use Topics  . Smoking status: Current Everyday Smoker -- 0.5 packs/day    Types: Cigarettes  . Smokeless tobacco: Not on file  . Alcohol Use: No    OB History    Grav Para Term Preterm Abortions TAB SAB Ect Mult Living                  Review of Systems  Skin: Positive for rash.  Negative for itching.  All other systems reviewed and are negative.    Physical Exam  BP 119/78  Pulse 85  Temp(Src) 98.3 F (36.8 C) (Oral)  Resp 20  Ht 5\' 5"  (1.651 m)  Wt 155 lb (70.308 kg)  BMI 25.79 kg/m2  SpO2 99%  Physical Exam  Nursing note and vitals reviewed. Constitutional: She is oriented to person, place, and time. She appears well-developed and well-nourished.  HENT:  Head: Normocephalic and atraumatic.  Eyes: Conjunctivae are normal.  Neck: Normal range of motion.  Cardiovascular: Normal rate, regular rhythm, normal heart sounds and intact distal pulses.   Pulmonary/Chest: Effort normal and breath sounds normal. She has no wheezes.  Abdominal: Soft. Bowel sounds are normal. There is no tenderness.  Musculoskeletal: Normal range of motion.  Neurological: She is alert and oriented to person, place, and time.  Skin: Skin is warm and dry. No rash noted. No erythema.       Sealed I &D site noted right axilla with no surrounding erythema,  Fluctuance or induration.  No axillary adenopathy appreciated.  Psychiatric: She has a normal mood and affect.    ED Course  Procedures  MDM Patient  with multiple prior ed visits for various pain complaint issues.   Todays exam revealing for pain out of proportion to findings.  No evidence for ongoing abscess,  Cellulitis or other skin or deeper structure infection.  VSS.  Does not tolerate ibuprofen secondary to stomach irritation.  Will switch to celebrex,  Encourage f/u with pcp.  Medical screening examination/treatment/procedure(s) were performed by non-physician practitioner and as supervising physician I was immediately available for consultation/collaboration.    Candis Musa, PA 05/24/11 1245  Suzi Roots, MD 06/02/11 [phone]

## 2011-05-28 MED ORDER — METRONIDAZOLE 500 MG PO TABS: 500.0000 mg | ORAL_TABLET | Freq: Three times a day (TID) | ORAL | Status: DC

## 2011-05-28 NOTE — Telephone Encounter (Signed)
Called pt, LMOM for pt to call. Called [facility] and they have not received the Prescription for Flagy. Routing to [facility] to complete the order for the Flagy so I can call in.

## 2011-05-30 NOTE — Telephone Encounter (Signed)
Please call pharm to see if pt has picked up abx or flagyl I RF'd flagyl for 10 days, but I need to know how long she is on abx to cover the rest of the time. Thanks

## 2011-05-30 NOTE — Telephone Encounter (Signed)
I called Scott at [facility]. He said pt has not picked up the Flagyl. I called pt and she said she did not start antibiotics for the boil. She was afraid. It was lanced and is doing better. However, she will have sinus surgery on 06/07/2011. She will be on antibiotics then. She will call us back and let us know what she is on and how many days she is to take it so that we can call in enough Flagyl to complete 10 days after the antibiotics. She is taking a probiotic daily.

## 2011-06-07 ENCOUNTER — Other Ambulatory Visit: Payer: Self-pay | Admitting: Otolaryngology

## 2011-06-25 ENCOUNTER — Telehealth: Payer: Self-pay

## 2011-06-25 ENCOUNTER — Ambulatory Visit: Payer: PRIVATE HEALTH INSURANCE | Admitting: Urgent Care

## 2011-06-25 ENCOUNTER — Ambulatory Visit: Payer: PRIVATE HEALTH INSURANCE | Admitting: Gastroenterology

## 2011-06-25 ENCOUNTER — Ambulatory Visit (INDEPENDENT_AMBULATORY_CARE_PROVIDER_SITE_OTHER): Payer: PRIVATE HEALTH INSURANCE | Admitting: Urgent Care

## 2011-06-25 ENCOUNTER — Encounter: Payer: Self-pay | Admitting: Urgent Care

## 2011-06-25 VITALS — BP 96/66 | HR 69 | Temp 98.0°F | Ht 65.0 in | Wt 157.0 lb

## 2011-06-25 DIAGNOSIS — K219 Gastro-esophageal reflux disease without esophagitis: Secondary | ICD-10-CM

## 2011-06-25 DIAGNOSIS — A0472 Enterocolitis due to Clostridium difficile, not specified as recurrent: Secondary | ICD-10-CM

## 2011-06-25 MED ORDER — METRONIDAZOLE 500 MG PO TABS: 500.0000 mg | ORAL_TABLET | Freq: Three times a day (TID) | ORAL | Status: DC

## 2011-06-25 NOTE — Patient Instructions (Signed)
Continue flagyl while you are on antibiotics Call if you develop fever or diarrhea worsens  Clostridium Difficile (C. Difficile) Clostridium difficile (C. diff) is a germ found in the intestines (colon). Most of the time, C. diff does not cause a problem. Sometimes, C. diff causes an infection in the colon. This infection causes:  Watery stool (diarrhea).   Belly pain or cramping.   Nausea.   Loss of appetite.   Fever.  The C. diff infection may occur after taking medicines that kill germs (antibiotics). C. diff diarrhea usually does not clear up by itself. The C.diff bacteria can easily spread to other people. HOME CARE INSTRUCTIONS  Drink plenty of fluids. AVOID milk, caffeine, and alcohol.   Eat a BRAT diet (banana, rice, applesauce, toast) to try to slow the diarrhea.   Eat small meals more often rather than large meals.   Take medicines that kill germs (antibiotic) only as prescribed by your doctor.   DO NOT use medications to slow the diarrhea.   Wash your hands with soap and water for at least 15 seconds after using the toilet.   As long as the diarrhea lasts, the toilet must be cleaned several times a day. Clean the toilet seat and handle, bathroom sink and bathroom door knobs with a bleach mix.   Mix 1 ounce of bleach with 3 cups of water, OR 1 tablespoon of bleach with 1 1/2 cups of water.   Make this bleach mix fresh every day.   If you use the same mix all day, keep it in a jar that you cannot see through. Light will break down the bleach.   Use a new paper towel or cloth for each object or area that is cleaned.   Never put the used towel or cloth back into the bleach mix.   Keep the bleach mix away from children.   Always label the bleach container!   Use waterproof gloves to protect your hands from the bleach mix.   As long as the diarrhea lasts, wash all towels, bed sheets and clothing daily in warm or hot water. This laundry should be washed apart from  other laundry.  GET HELP RIGHT AWAY IF:  You have a temperature by mouth above 102 F (38.9 C).   You develop more belly pain or tenderness.   You develop blood in the stool, or the stool is dark black and tar-like.   You cannot hold down food or liquids.   You cannot pee or are not peeing as much as you normally do.  MAKE SURE YOU:  Understand these instructions.   Will watch your condition.   Will get help right away if you are not doing well or get worse.  Document Released: 07/22/2009  [facility] Patient Information 2011 [place], [place].

## 2011-06-25 NOTE — Telephone Encounter (Signed)
I had a VM from pt this AM. Pt said she had sinus surgery last week and was confirmed she has MRSA again. She was put on antibiotics and she is requesting Flagyl due to the Hx of C-Diff. However, she was on schedule for appt at 8:00 Am this morning. So I called and left message for a return call. She called back and said she didn't feel like coming this AM she had a headache. I topld her she really needed to come in if she is needing some medication, and she was due for a follow up anyway. She agreed and was scheduled at 2:00 pm today with Lorenza Burton, NP.

## 2011-06-26 ENCOUNTER — Encounter: Payer: Self-pay | Admitting: Urgent Care

## 2011-06-26 NOTE — Progress Notes (Signed)
Primary Care Physician:  Elby Showers, MD Primary Gastroenterologist:  Dr. Darrick Penna  Chief Complaint  Patient presents with  . Follow-up    C DIFF    HPI:  [name] is a 50 y.o. female who has hx c diff colitis 04/2011.  She had been on chronic antibiotics for chronic MRSA sinusitis.  Recently underwent sinus surgery last week by Dr Lazarus Salines & is being treated w/ doxycycline 100mg  BID (started 9/7).    She is requesting coverage for c diff today.  She did start flagyl 500mg  TID at the same time, but ran out 2 days ago.  She has noticed cramps & lower back pain.  Denies any diarrhea.  Takes vicodin for pain prn.  C/o nausea.  No vomiting.  Denies fever.  Denies anorexia.  Past Medical History  Diagnosis Date  . S/P colonoscopy 06/02/09    normal (Dr. Linna Darner)  . PUD (peptic ulcer disease) 01/2007    EGD Dr Jena Gauss, 2 antral ulcers, negative h pylori  . MRSA infection     Dr. Lenice Pressman, currently under treatment  . Hiatal hernia 2008    on EGD above  . Asthma   . PTSD (post-traumatic stress disorder)   . Migraines   . Degenerative disc disease   . Rectal prolapse   . SBO (small bowel obstruction)   . ADHD (attention deficit hyperactivity disorder)   . S/P endoscopy 03/26/11    gastritis-Dr Darrick Penna  . Clostridium difficile colitis 04/10/11    tx w/ flagyl    Past Surgical History  Procedure Date  . Bowel resection     x2, secondary to adhesions  . Appendectomy   . Laparoscopic lysis intestinal adhesions   . Back surgery   . Abdominal hysterectomy   . Tubal ligation   . Umbilical hernia repair     x5  . Neck surgery     Current Outpatient Prescriptions  Medication Sig Dispense Refill  . albuterol (PROVENTIL,VENTOLIN) 90 MCG/ACT inhaler Inhale 2 puffs into the lungs every 6 (six) hours as needed. For shortness of breath       . ALPRAZolam (XANAX) 1 MG tablet Take 1 mg by mouth 4 (four) times daily as needed. Anxiety      . amphetamine-dextroamphetamine (ADDERALL  XR) 15 MG 24 hr capsule Take 15 mg by mouth 2 (two) times daily.        . baclofen (LIORESAL) 10 MG tablet Take 10 mg by mouth as needed. For migraine      . dexlansoprazole (DEXILANT) 60 MG capsule Take 60 mg by mouth daily.        . DULoxetine (CYMBALTA) 60 MG capsule Take 120 mg by mouth daily.       Marland Kitchen gabapentin (NEURONTIN) 300 MG capsule Take 600-900 mg by mouth 2 (two) times daily. Takes 600 mg every morning and 900 mg in the evening      . Lactobacillus (ACIDOPHILUS) CAPS Take 1 capsule by mouth daily. Patient unsure of correct strength, she buys this over the counter       . Multiple Vitamins-Minerals (MULTIVITAL) tablet Take 1 tablet by mouth daily.        . ondansetron (ZOFRAN) 4 MG tablet Take 4 mg by mouth every 8 (eight) hours as needed. For nausea       . topiramate (TOPAMAX) 25 MG tablet Take 25 mg by mouth 3 (three) times daily.       . traMADol (ULTRAM) 50 MG tablet Take 50 mg by  mouth every 6 (six) hours as needed. Pain       . UNKNOWN TO PATIENT Doxycycline po for chronic MRSA sinusitis        . metroNIDAZOLE (FLAGYL) 500 MG tablet Take 1 tablet (500 mg total) by mouth 3 (three) times daily.  42 tablet  0    Allergies as of 06/25/2011 - Review Complete 06/25/2011  Allergen Reaction Noted  . Aspirin    . Penicillins    . Sulfa antibiotics Itching 05/24/2011    Review of Systems: See HPI, otherwise negative ROS  Physical Exam: BP 96/66  Pulse 69  Temp(Src) 98 F (36.7 C) (Temporal)  Ht 5\' 5"  (1.651 m)  Wt 157 lb (71.215 kg)  BMI 26.13 kg/m2 General:   Alert,  Well-developed, well-nourished, pleasant and cooperative in NAD Head:  Normocephalic and atraumatic. Eyes:  Sclera clear, no icterus.   Conjunctiva pink. Mouth:  No deformity or lesions, OP pink/moist. Neck:  Supple; no masses or thyromegaly. Heart:  Regular rate and rhythm; no murmurs, clicks, rubs,  or gallops. Abdomen:  Soft, nontender and nondistended. No masses, hepatosplenomegaly or hernias noted.  Normal bowel sounds, without guarding, and without rebound.   Msk:  Symmetrical without gross deformities. Normal posture. Pulses:  Normal pulses noted. Extremities:  Without clubbing or edema. Neurologic:  Alert and  oriented x4;  grossly normal neurologically. Skin:  Intact without significant lesions or rashes. Cervical Nodes:  No significant cervical adenopathy. Psych:  Alert and cooperative. Normal mood and affect.

## 2011-06-26 NOTE — Assessment & Plan Note (Addendum)
GERD/gastritis well controlled on dexilant 60mg  daily  (Late entry secondary to systemwide downtime)

## 2011-06-26 NOTE — Assessment & Plan Note (Addendum)
Hx c diff colitis 04/2011.  Recent sinus surgery for chronic MRSA sinusitis on doxycycline until 06/29/11.  No diarrhea at this time.  Prophylactic coverage w/ flagyl 500mg  TID until 10 days after doxycycline complete. Call if diarrhea or any new problems

## 2011-06-27 NOTE — Telephone Encounter (Signed)
Opened in error

## 2011-06-27 NOTE — Telephone Encounter (Signed)
Agree 

## 2011-06-27 NOTE — Progress Notes (Signed)
Cc to PCP 

## 2011-07-02 LAB — DIFFERENTIAL
Lymphs Abs: 2.3
Monocytes Absolute: 0.8
Monocytes Relative: 9
Neutro Abs: 5.2
Neutrophils Relative %: 60

## 2011-07-02 LAB — URINALYSIS, ROUTINE W REFLEX MICROSCOPIC
Glucose, UA: NEGATIVE
Hgb urine dipstick: NEGATIVE
Protein, ur: NEGATIVE
Specific Gravity, Urine: 1.015
Urobilinogen, UA: 0.2

## 2011-07-02 LAB — CBC
Hemoglobin: 13.7
RBC: 4.2
WBC: 8.6

## 2011-07-02 LAB — BASIC METABOLIC PANEL
Calcium: 9.4
Chloride: 106
Creatinine, Ser: 0.72
GFR calc Af Amer: >60
Sodium: 140

## 2011-07-06 LAB — BASIC METABOLIC PANEL
Calcium: 10
Creatinine, Ser: 0.73
GFR calc Af Amer: >60
GFR calc non Af Amer: >60
Sodium: 136

## 2011-07-06 LAB — URINALYSIS, ROUTINE W REFLEX MICROSCOPIC
Bilirubin Urine: NEGATIVE
Bilirubin Urine: NEGATIVE
Glucose, UA: NEGATIVE
Ketones, ur: NEGATIVE
Ketones, ur: NEGATIVE
Nitrite: NEGATIVE
Specific Gravity, Urine: 1.025
Urobilinogen, UA: 0.2
pH: 5.5

## 2011-07-06 LAB — DIFFERENTIAL
Basophils Absolute: 0.3 — ABNORMAL HIGH
Basophils Absolute: 0
Basophils Relative: 4 — ABNORMAL HIGH
Basophils Relative: 0
Eosinophils Absolute: 0.4
Monocytes Relative: 9
Neutro Abs: 8.7 — ABNORMAL HIGH
Neutrophils Relative %: 76

## 2011-07-06 LAB — CBC
HCT: 40.6
HCT: 35.6 — ABNORMAL LOW
Hemoglobin: 13.7
Hemoglobin: 11.8 — ABNORMAL LOW
Hemoglobin: 11.9 — ABNORMAL LOW
MCHC: 33.7
MCHC: 33.2
MCV: 93.9
RBC: 4.32
RBC: 3.71 — ABNORMAL LOW
RBC: 3.77 — ABNORMAL LOW
RDW: 14.2
WBC: 7.3

## 2011-07-06 LAB — COMPREHENSIVE METABOLIC PANEL
ALT: 58 — ABNORMAL HIGH
ALT: 57 — ABNORMAL HIGH
AST: 46 — ABNORMAL HIGH
Albumin: 4.4
Alkaline Phosphatase: 147 — ABNORMAL HIGH
BUN: 15
CO2: 30
CO2: 29
Calcium: 10.3
Creatinine, Ser: 0.74
GFR calc Af Amer: >60
GFR calc non Af Amer: >60
Glucose, Bld: 117 — ABNORMAL HIGH
Potassium: 3.9
Sodium: 140
Total Bilirubin: 0.3
Total Protein: 7

## 2011-07-06 LAB — PROTIME-INR: INR: 0.9

## 2011-07-06 LAB — LIPASE, BLOOD: Lipase: 14

## 2011-07-06 LAB — APTT: aPTT: 27

## 2011-07-09 LAB — URINALYSIS, ROUTINE W REFLEX MICROSCOPIC
Bilirubin Urine: NEGATIVE
Glucose, UA: NEGATIVE
Hgb urine dipstick: NEGATIVE
Ketones, ur: NEGATIVE
Protein, ur: NEGATIVE
Urobilinogen, UA: 0.2

## 2011-07-09 LAB — TYPE AND SCREEN

## 2011-07-09 LAB — COMPREHENSIVE METABOLIC PANEL
ALT: 33
AST: 28
CO2: 25
Calcium: 10
Chloride: 105
Creatinine, Ser: 0.69
GFR calc Af Amer: >60
GFR calc non Af Amer: >60
Glucose, Bld: 79
Sodium: 138
Total Bilirubin: 0.4

## 2011-07-09 LAB — CBC
Hemoglobin: 12.2
MCHC: 32.9
MCV: 93.7
RBC: 3.95
WBC: 6.9

## 2011-07-09 LAB — DIFFERENTIAL
Basophils Absolute: 0.1
Eosinophils Absolute: 0.4
Eosinophils Relative: 6 — ABNORMAL HIGH
Lymphs Abs: 2.2
Neutrophils Relative %: 49

## 2011-07-09 LAB — PROTIME-INR
INR: 0.9
Prothrombin Time: 12.4

## 2011-07-09 LAB — ABO/RH: ABO/RH(D): A POS

## 2011-07-10 LAB — BASIC METABOLIC PANEL
BUN: 4 — ABNORMAL LOW
CO2: 28
CO2: 40 — ABNORMAL HIGH
CO2: 29
Calcium: 8.6
Calcium: 9.2
Chloride: 107
Creatinine, Ser: 0.47
Creatinine, Ser: 0.58
Creatinine, Ser: 0.54
GFR calc Af Amer: >60
GFR calc Af Amer: >60
GFR calc Af Amer: >60
GFR calc non Af Amer: >60
GFR calc non Af Amer: >60
Glucose, Bld: 107 — ABNORMAL HIGH
Glucose, Bld: 136 — ABNORMAL HIGH
Sodium: 138

## 2011-07-10 LAB — DIFFERENTIAL
Basophils Absolute: 0.3 — ABNORMAL HIGH
Basophils Absolute: 0
Basophils Relative: 3 — ABNORMAL HIGH
Basophils Relative: 0
Lymphocytes Relative: 22
Lymphocytes Relative: 20
Neutro Abs: 6.2
Neutro Abs: 6
Neutrophils Relative %: 64
Neutrophils Relative %: 65

## 2011-07-10 LAB — COMPREHENSIVE METABOLIC PANEL
Albumin: 3.1 — ABNORMAL LOW
Alkaline Phosphatase: 112
BUN: 6
CO2: 27
Chloride: 107
Creatinine, Ser: 0.59
GFR calc non Af Amer: >60
Glucose, Bld: 96
Potassium: 3.5
Total Bilirubin: 0.1 — ABNORMAL LOW

## 2011-07-10 LAB — CROSSMATCH
ABO/RH(D): A POS
Antibody Screen: NEGATIVE

## 2011-07-10 LAB — CBC
HCT: 31.2 — ABNORMAL LOW
Hemoglobin: 10.1 — ABNORMAL LOW
MCHC: 32.6
MCHC: 33.1
MCV: 89.1
MCV: 92.6
Platelets: 829 — ABNORMAL HIGH
RBC: 3.15 — ABNORMAL LOW
RDW: 12.6
RDW: 15.1
WBC: 9.8

## 2011-07-10 LAB — CLOSTRIDIUM DIFFICILE EIA

## 2011-07-10 LAB — URINALYSIS, ROUTINE W REFLEX MICROSCOPIC
Glucose, UA: NEGATIVE
Ketones, ur: NEGATIVE
Nitrite: NEGATIVE
Nitrite: NEGATIVE
Specific Gravity, Urine: 1.01
Urobilinogen, UA: 0.2
pH: 6.5
pH: 5.5

## 2011-07-10 LAB — APTT: aPTT: 32

## 2011-07-10 LAB — STOOL CULTURE

## 2011-07-10 LAB — URINE MICROSCOPIC-ADD ON

## 2011-07-10 LAB — URINE CULTURE

## 2011-07-10 LAB — LIPASE, BLOOD: Lipase: 22

## 2011-07-10 LAB — HEMOGLOBIN AND HEMATOCRIT, BLOOD
HCT: 21.9 — ABNORMAL LOW
HCT: 24.9 — ABNORMAL LOW

## 2011-07-10 LAB — PROTIME-INR
INR: 1
Prothrombin Time: 13.4

## 2011-07-10 LAB — OVA AND PARASITE EXAMINATION

## 2011-07-12 LAB — HEPATITIS PANEL, ACUTE
HCV Ab: NEGATIVE
Hep B C IgM: NEGATIVE
Hepatitis B Surface Ag: NEGATIVE

## 2011-07-12 LAB — COMPREHENSIVE METABOLIC PANEL
ALT: 174 U/L — ABNORMAL HIGH (ref 0–35)
ALT: 88 U/L — ABNORMAL HIGH (ref 0–35)
AST: 220 U/L — ABNORMAL HIGH (ref 0–37)
AST: 88 U/L — ABNORMAL HIGH (ref 0–37)
Albumin: 3.4 g/dL — ABNORMAL LOW (ref 3.5–5.2)
Albumin: 3.9 g/dL (ref 3.5–5.2)
BUN: 13 mg/dL (ref 6–23)
BUN: 7 mg/dL (ref 6–23)
CO2: 24 mEq/L (ref 19–32)
CO2: 30 mEq/L (ref 19–32)
Calcium: 8.8 mg/dL (ref 8.4–10.5)
Calcium: 9.2 mg/dL (ref 8.4–10.5)
Calcium: 9.8 mg/dL (ref 8.4–10.5)
Calcium: 9.9 mg/dL (ref 8.4–10.5)
Chloride: 103 mEq/L (ref 96–112)
Chloride: 110 mEq/L (ref 96–112)
Creatinine, Ser: 0.52 mg/dL (ref 0.4–1.2)
Creatinine, Ser: 0.53 mg/dL (ref 0.4–1.2)
GFR calc Af Amer: >60 mL/min (ref >60)
GFR calc Af Amer: >60 mL/min (ref >60)
GFR calc Af Amer: >60 mL/min (ref >60)
GFR calc non Af Amer: >60 mL/min (ref >60)
GFR calc non Af Amer: >60 mL/min (ref >60)
Glucose, Bld: 117 mg/dL — ABNORMAL HIGH (ref 70–99)
Glucose, Bld: 76 mg/dL (ref 70–99)
Potassium: 4.5 mEq/L (ref 3.5–5.1)
Sodium: 137 mEq/L (ref 135–145)
Sodium: 141 mEq/L (ref 135–145)
Total Bilirubin: 0.3 mg/dL (ref 0.3–1.2)
Total Bilirubin: 0.3 mg/dL (ref 0.3–1.2)
Total Protein: 6 g/dL (ref 6.0–8.3)
Total Protein: 6.6 g/dL (ref 6.0–8.3)

## 2011-07-12 LAB — CBC
HCT: 33.5 % — ABNORMAL LOW (ref 36.0–46.0)
HCT: 34.5 % — ABNORMAL LOW (ref 36.0–46.0)
Hemoglobin: 12.4 g/dL (ref 12.0–15.0)
MCHC: 32.2 g/dL (ref 30.0–36.0)
MCHC: 32.5 g/dL (ref 30.0–36.0)
MCHC: 32.7 g/dL (ref 30.0–36.0)
MCV: 89.8 fL (ref 78.0–100.0)
MCV: 90.3 fL (ref 78.0–100.0)
Platelets: 282 10*3/uL (ref 150–400)
Platelets: 332 10*3/uL (ref 150–400)
RBC: 3.83 MIL/uL — ABNORMAL LOW (ref 3.87–5.11)
RDW: 15.7 % — ABNORMAL HIGH (ref 11.5–15.5)
WBC: 5.2 10*3/uL (ref 4.0–10.5)

## 2011-07-12 LAB — IRON AND TIBC
Saturation Ratios: 36 % (ref 20–55)
UIBC: 234 ug/dL

## 2011-07-12 LAB — DIFFERENTIAL
Basophils Absolute: 0 10*3/uL (ref 0.0–0.1)
Basophils Absolute: 0 10*3/uL (ref 0.0–0.1)
Lymphocytes Relative: 33 % (ref 12–46)
Lymphocytes Relative: 38 % (ref 12–46)
Lymphs Abs: 1.9 10*3/uL (ref 0.7–4.0)
Lymphs Abs: 1.9 10*3/uL (ref 0.7–4.0)
Neutro Abs: 2.2 10*3/uL (ref 1.7–7.7)
Neutro Abs: 2.9 10*3/uL (ref 1.7–7.7)
Neutrophils Relative %: 52 % (ref 43–77)

## 2011-07-12 LAB — URINALYSIS, ROUTINE W REFLEX MICROSCOPIC
Glucose, UA: NEGATIVE mg/dL
Protein, ur: NEGATIVE mg/dL
Specific Gravity, Urine: 1.017 (ref 1.005–1.030)

## 2011-07-12 LAB — HEPATIC FUNCTION PANEL
ALT: 222 U/L — ABNORMAL HIGH (ref 0–35)
AST: 155 U/L — ABNORMAL HIGH (ref 0–37)
Albumin: 3.4 g/dL — ABNORMAL LOW (ref 3.5–5.2)
Bilirubin, Direct: <0.1 mg/dL (ref 0.0–0.3)
Total Bilirubin: 0.3 mg/dL (ref 0.3–1.2)

## 2011-07-12 LAB — LIPASE, BLOOD: Lipase: 23 U/L (ref 11–59)

## 2011-07-12 LAB — URINE CULTURE

## 2011-07-12 LAB — MAGNESIUM: Magnesium: 2.5 mg/dL (ref 1.5–2.5)

## 2011-07-12 LAB — PROTIME-INR: INR: 1 (ref 0.00–1.49)

## 2011-07-12 LAB — TSH: TSH: 1.629 u[IU]/mL (ref 0.350–4.500)

## 2011-07-12 LAB — AMYLASE: Amylase: 58 U/L (ref 27–131)

## 2011-07-12 LAB — FERRITIN: Ferritin: 56 ng/mL (ref 10–291)

## 2011-07-12 LAB — FOLATE RBC: RBC Folate: 982 ng/mL — ABNORMAL HIGH (ref 180–600)

## 2011-07-12 LAB — HEMOGLOBIN AND HEMATOCRIT, BLOOD: Hemoglobin: 12.1 g/dL (ref 12.0–15.0)

## 2011-07-13 LAB — COMPREHENSIVE METABOLIC PANEL
Albumin: 3.8
BUN: 15
BUN: 20
CO2: 27
Chloride: 108
Creatinine, Ser: 0.63
Creatinine, Ser: 0.64
GFR calc non Af Amer: >60
Total Bilirubin: 0.3
Total Protein: 6.7

## 2011-07-13 LAB — CBC
HCT: 38.8
HCT: 44
MCV: 94.7
MCV: 95.5
Platelets: 305
Platelets: 315
RBC: 4.06
RDW: 14.1
WBC: 11.8 — ABNORMAL HIGH

## 2011-07-13 LAB — DIFFERENTIAL
Basophils Absolute: 0
Eosinophils Absolute: 0.4
Eosinophils Relative: 3
Lymphocytes Relative: 13
Lymphocytes Relative: 23
Lymphs Abs: 1.5
Lymphs Abs: 2.7
Monocytes Absolute: 0.6
Monocytes Absolute: 0.7
Monocytes Relative: 5
Neutro Abs: 9 — ABNORMAL HIGH

## 2011-07-15 ENCOUNTER — Emergency Department (HOSPITAL_COMMUNITY)
Admission: EM | Admit: 2011-07-15 | Discharge: 2011-07-15 | Disposition: A | Discharge status: Home / Self Care | Payer: PRIVATE HEALTH INSURANCE | Attending: Emergency Medicine | Admitting: Emergency Medicine

## 2011-07-15 ENCOUNTER — Emergency Department (HOSPITAL_COMMUNITY): Payer: PRIVATE HEALTH INSURANCE

## 2011-07-15 ENCOUNTER — Encounter (HOSPITAL_COMMUNITY): Payer: Self-pay | Admitting: Emergency Medicine

## 2011-07-15 DIAGNOSIS — R350 Frequency of micturition: Secondary | ICD-10-CM | POA: Insufficient documentation

## 2011-07-15 DIAGNOSIS — R109 Unspecified abdominal pain: Secondary | ICD-10-CM | POA: Insufficient documentation

## 2011-07-15 DIAGNOSIS — M549 Dorsalgia, unspecified: Secondary | ICD-10-CM

## 2011-07-15 DIAGNOSIS — F17200 Nicotine dependence, unspecified, uncomplicated: Secondary | ICD-10-CM | POA: Insufficient documentation

## 2011-07-15 DIAGNOSIS — Z79899 Other long term (current) drug therapy: Secondary | ICD-10-CM | POA: Insufficient documentation

## 2011-07-15 DIAGNOSIS — R110 Nausea: Secondary | ICD-10-CM | POA: Insufficient documentation

## 2011-07-15 LAB — URINALYSIS, ROUTINE W REFLEX MICROSCOPIC
Bilirubin Urine: NEGATIVE
Hgb urine dipstick: NEGATIVE
Nitrite: NEGATIVE
Specific Gravity, Urine: 1.01 (ref 1.005–1.030)
pH: 7.5 (ref 5.0–8.0)

## 2011-07-15 MED ORDER — SODIUM CHLORIDE 0.9 % IV BOLUS (SEPSIS)
1000.0000 mL | Freq: Once | INTRAVENOUS | Status: AC
  Administered 2011-07-15: 1000 mL via INTRAVENOUS

## 2011-07-15 MED ORDER — HYDROCODONE-ACETAMINOPHEN 5-325 MG PO TABS: 1.0000 | ORAL_TABLET | Freq: Four times a day (QID) | ORAL | Status: AC | PRN

## 2011-07-15 MED ORDER — KETOROLAC TROMETHAMINE 30 MG/ML IJ SOLN
30.0000 mg | Freq: Once | INTRAMUSCULAR | Status: AC
  Administered 2011-07-15: 30 mg via INTRAVENOUS
  Filled 2011-07-15: qty 1

## 2011-07-15 MED ORDER — HYDROMORPHONE HCL 1 MG/ML IJ SOLN
1.0000 mg | Freq: Once | INTRAMUSCULAR | Status: AC
  Administered 2011-07-15: 1 mg via INTRAVENOUS
  Filled 2011-07-15: qty 1

## 2011-07-15 MED ORDER — ONDANSETRON HCL 4 MG/2ML IJ SOLN
4.0000 mg | Freq: Once | INTRAMUSCULAR | Status: AC
  Administered 2011-07-15: 4 mg via INTRAVENOUS
  Filled 2011-07-15: qty 2

## 2011-07-15 NOTE — ED Notes (Signed)
Patient c/o right side back pain that radiates into right groin with hesitance with urination and nausea. Patient reports having hx of kidneys stones and states "This is how it feels."

## 2011-07-15 NOTE — ED Provider Notes (Signed)
History  Scribed for Dr. Estell Harpin, the patient was seen in room APA11. The chart was scribed by Gilman Schmidt. The patients care was started at 1518. CSN: [id number] Arrival date & time: 07/15/2011 10:57 AM  Chief Complaint  Patient presents with  . Back Pain  . Groin Pain  . Nausea    HPI [name] is a 50 y.o. female with a history of multiple illnesses including kidney stones who presents to the Emergency Department complaining of flank pain that radiates into right groin with hesitance with urination and nausea. Additionally reports that symptoms feel similar to past kidney stones. Last kidney stone was nine years ago. Pain is exacerbated by standing. Pt has taken Tramadol for pain. There are no other associated symptoms and no other alleviating or aggravating factors.   PAST MEDICAL HISTORY:  Past Medical History  Diagnosis Date  . S/P colonoscopy 06/02/09    normal (Dr. Linna Darner)  . PUD (peptic ulcer disease) 01/2007    EGD Dr Jena Gauss, 2 antral ulcers, negative h pylori  . MRSA infection     Dr. Lenice Pressman, currently under treatment  . Hiatal hernia 2008    on EGD above  . Asthma   . PTSD (post-traumatic stress disorder)   . Migraines   . Degenerative disc disease   . Rectal prolapse   . SBO (small bowel obstruction)   . ADHD (attention deficit hyperactivity disorder)   . S/P endoscopy 03/26/11    gastritis-Dr Darrick Penna  . Clostridium difficile colitis 04/10/11    tx w/ flagyl  . Kidney stones      PAST SURGICAL HISTORY:  Past Surgical History  Procedure Date  . Bowel resection     x2, secondary to adhesions  . Appendectomy   . Laparoscopic lysis intestinal adhesions   . Back surgery   . Abdominal hysterectomy   . Tubal ligation   . Umbilical hernia repair     x5  . Neck surgery   . Partial hysterectomy      MEDICATIONS:  Previous Medications   ALBUTEROL (PROVENTIL,VENTOLIN) 90 MCG/ACT INHALER    Inhale 2 puffs into the lungs every 6 (six) hours as needed.  For shortness of breath    ALPRAZOLAM (XANAX) 1 MG TABLET    Take 1 mg by mouth 4 (four) times daily as needed. Anxiety   AMPHETAMINE-DEXTROAMPHETAMINE (ADDERALL XR) 15 MG 24 HR CAPSULE    Take 15 mg by mouth 2 (two) times daily.    AMPHETAMINE-DEXTROAMPHETAMINE (ADDERALL) 30 MG TABLET    Take 30 mg by mouth 2 (two) times daily as needed. When needed for ADHD    BACLOFEN (LIORESAL) 10 MG TABLET    Take 10 mg by mouth as needed. For migraine   DEXLANSOPRAZOLE (DEXILANT) 60 MG CAPSULE    Take 60 mg by mouth daily.    DULOXETINE (CYMBALTA) 60 MG CAPSULE    Take 120 mg by mouth daily.    GABAPENTIN (NEURONTIN) 300 MG CAPSULE    Take 600-900 mg by mouth 2 (two) times daily. Takes 600 mg every morning and 900 mg in the evening   LACTOBACILLUS (ACIDOPHILUS) CAPS    Take 2 capsules by mouth 2 (two) times daily with breakfast and lunch. Patient unsure of correct strength, she buys this over the counter   LIDOCAINE (LIDODERM) 5 %    Place 1 patch onto the skin daily as needed. Uses for pain in back. Not used regularly.    MULTIPLE VITAMINS-MINERALS (MULTIVITAL) TABLET  Take 1 tablet by mouth daily.     ONDANSETRON (ZOFRAN) 4 MG TABLET    Take 4 mg by mouth every 8 (eight) hours as needed. For nausea    PROMETHAZINE (PHENERGAN) 25 MG TABLET    Take 25 mg by mouth every 6 (six) hours as needed. For nausea    TOPIRAMATE (TOPAMAX) 25 MG TABLET    Take 25 mg by mouth 3 (three) times daily.    TRAMADOL (ULTRAM) 50 MG TABLET    Take 50 mg by mouth every 6 (six) hours as needed. Pain    UNKNOWN TO PATIENT    Doxycycline po for chronic MRSA sinusitis       ALLERGIES:  Allergies as of 07/15/2011 - Review Complete 07/15/2011  Allergen Reaction Noted  . Penicillins Anaphylaxis   . Sulfa antibiotics Anaphylaxis 05/24/2011  . Aspirin Other (See Comments)   . Morphine and related Itching 07/15/2011     FAMILY HISTORY:  Family History  Problem Relation Age of Onset  . Adopted: Yes     SOCIAL  HISTORY: History  Substance Use Topics  . Smoking status: Current Everyday Smoker -- 0.5 packs/day for 1 years    Types: Cigarettes  . Smokeless tobacco: Never Used  . Alcohol Use: No     Review of Systems  Gastrointestinal: Positive for nausea. Negative for vomiting and diarrhea.  Genitourinary: Positive for frequency, flank pain and difficulty urinating. Negative for hematuria.  All other systems reviewed and are negative.    Allergies  Penicillins; Sulfa antibiotics; Aspirin; and Morphine and related  Home Medications   Current Outpatient Rx  Name Route Sig Dispense Refill  . ALBUTEROL 90 MCG/ACT IN AERS Inhalation Inhale 2 puffs into the lungs every 6 (six) hours as needed. For shortness of breath     . ALPRAZOLAM 1 MG PO TABS Oral Take 1 mg by mouth 4 (four) times daily as needed. Anxiety    . AMPHETAMINE-DEXTROAMPHETAMINE 30 MG PO TABS Oral Take 30 mg by mouth 2 (two) times daily as needed. When needed for ADHD     . BACLOFEN 10 MG PO TABS Oral Take 10 mg by mouth as needed. For migraine    . DEXLANSOPRAZOLE 60 MG PO CPDR Oral Take 60 mg by mouth daily.     . DULOXETINE HCL 60 MG PO CPEP Oral Take 120 mg by mouth daily.     Marland Kitchen GABAPENTIN 300 MG PO CAPS Oral Take 600-900 mg by mouth 2 (two) times daily. Takes 600 mg every morning and 900 mg in the evening    . ACIDOPHILUS PO CAPS Oral Take 2 capsules by mouth 2 (two) times daily with breakfast and lunch. Patient unsure of correct strength, she buys this over the counter    . LIDOCAINE 5 % EX PTCH Transdermal Place 1 patch onto the skin daily as needed. Uses for pain in back. Not used regularly.     . MULTIVITAL PO TABS Oral Take 1 tablet by mouth daily.      Marland Kitchen PROMETHAZINE HCL 25 MG PO TABS Oral Take 25 mg by mouth every 6 (six) hours as needed. For nausea     . TOPIRAMATE 25 MG PO TABS Oral Take 25 mg by mouth 3 (three) times daily.     . TRAMADOL HCL 50 MG PO TABS Oral Take 50 mg by mouth every 6 (six) hours as needed. Pain      . AMPHETAMINE-DEXTROAMPHETAMINE 15 MG PO CP24 Oral Take 15 mg by  mouth 2 (two) times daily.     Marland Kitchen ONDANSETRON HCL 4 MG PO TABS Oral Take 4 mg by mouth every 8 (eight) hours as needed. For nausea     . UNKNOWN TO PATIENT  Doxycycline po for chronic MRSA sinusitis        BP 150/100  Pulse 58  Temp(Src) 98.6 F (37 C) (Oral)  Resp 58  Ht 5\' 5"  (1.651 m)  Wt 153 lb (69.4 kg)  BMI 25.46 kg/m2  SpO2 99%  Physical Exam  Nursing note and vitals reviewed. Constitutional: She is oriented to person, place, and time. She appears well-developed and well-nourished.  HENT:  Head: Normocephalic and atraumatic.  Eyes: Conjunctivae and EOM are normal. Pupils are equal, round, and reactive to light.  Neck: Normal range of motion. Neck supple.  Cardiovascular: Normal rate and regular rhythm.   Pulmonary/Chest: Effort normal and breath sounds normal.  Abdominal: Soft. Bowel sounds are normal.  Musculoskeletal: Normal range of motion. She exhibits tenderness.       Tenderness in right flank and RLQ  Neurological: She is alert and oriented to person, place, and time.  Skin: Skin is warm and dry.  Psychiatric: She has a normal mood and affect.    ED Course  Procedures  OTHER DATA REVIEWED: Nursing notes, vital signs, and past medical records reviewed.    DIAGNOSTIC STUDIES: Oxygen Saturation is 98% on room air, normal by my interpretation.    LABS:  Results for orders placed during the hospital encounter of 07/15/11  URINALYSIS, ROUTINE W REFLEX MICROSCOPIC      Component Value Range   Color, Urine YELLOW  YELLOW    Appearance CLEAR  CLEAR    Specific Gravity, Urine 1.010  1.005 - 1.030    pH 7.5  5.0 - 8.0    Glucose, UA NEGATIVE  NEGATIVE (mg/dL)   Hgb urine dipstick NEGATIVE  NEGATIVE    Bilirubin Urine NEGATIVE  NEGATIVE    Ketones, ur NEGATIVE  NEGATIVE (mg/dL)   Protein, ur NEGATIVE  NEGATIVE (mg/dL)   Urobilinogen, UA 0.2  0.0 - 1.0 (mg/dL)   Nitrite NEGATIVE  NEGATIVE     Leukocytes, UA NEGATIVE  NEGATIVE      RADIOLOGY:  CT Abdomen Pelvis. Reviewed by me.  IMPRESSION: 1. No acute findings. No evidence for hydronephrosis or nephrolithiasis. Original Report Authenticated By: Rosealee Albee, M.D.   ED COURSE / COORDINATION OF CARE: 1518:Patient evaluated by ED physician, Toradol, Zofran, CT abdomen, and UA ordered   RUE:[id number] back pain.  sciatica  IMPRESSION: Diagnoses that have been ruled out:  Diagnoses that are still under consideration:  Final diagnoses:    PLAN:  Home The patient is to return the emergency department if there is any worsening of symptoms. I have reviewed the discharge instructions with the patient.  CONDITION ON DISCHARGE: Stable  MEDICATIONS GIVEN IN THE E.D.  Medications  amphetamine-dextroamphetamine (ADDERALL) 30 MG tablet (not administered)  lidocaine (LIDODERM) 5 % (not administered)  promethazine (PHENERGAN) 25 MG tablet (not administered)    DISCHARGE MEDICATIONS: New Prescriptions   No medications on file    SCRIBE ATTESTATION: The chart was scribed for me under my direct supervision.  I personally performed the history, physical, and medical decision making and all procedures in the evaluation of this patient.[name], MD 07/15/11 [phone]

## 2011-07-17 LAB — BASIC METABOLIC PANEL
BUN: 14
CO2: 23
Chloride: 106
GFR calc non Af Amer: >60
Glucose, Bld: 99
Potassium: 3.4 — ABNORMAL LOW

## 2011-07-17 LAB — COMPREHENSIVE METABOLIC PANEL
BUN: 16
Calcium: 9.4
GFR calc non Af Amer: >60
Glucose, Bld: 109 — ABNORMAL HIGH
Total Protein: 5.7 — ABNORMAL LOW

## 2011-07-17 LAB — CBC
HCT: 38.3
Hemoglobin: 13.2
Hemoglobin: 14.6
MCHC: 32.8
MCHC: 34.3
MCV: 96.5
MCV: 96.8
RBC: 4.6
RDW: 14.4

## 2011-07-17 LAB — LIPASE, BLOOD: Lipase: 19

## 2011-07-17 LAB — URINALYSIS, ROUTINE W REFLEX MICROSCOPIC
Bilirubin Urine: NEGATIVE
Glucose, UA: NEGATIVE
Hgb urine dipstick: NEGATIVE
Ketones, ur: NEGATIVE
pH: 5.5

## 2011-07-17 LAB — DIFFERENTIAL
Basophils Relative: 0
Basophils Relative: 1
Lymphs Abs: 2.6
Monocytes Absolute: 0.7
Monocytes Relative: 7
Monocytes Relative: 7
Neutro Abs: 7.7
Neutro Abs: 8.6 — ABNORMAL HIGH
Neutrophils Relative %: 68

## 2011-07-18 LAB — DIFFERENTIAL
Eosinophils Relative: 2
Lymphocytes Relative: 16
Lymphs Abs: 1.9
Monocytes Absolute: 0.9 — ABNORMAL HIGH
Monocytes Relative: 7
Neutro Abs: 9.1 — ABNORMAL HIGH

## 2011-07-18 LAB — CSF CELL COUNT WITH DIFFERENTIAL
RBC Count, CSF: 320 — ABNORMAL HIGH
Tube #: 4
WBC, CSF: 1

## 2011-07-18 LAB — COMPREHENSIVE METABOLIC PANEL
AST: 22
Albumin: 3.6
BUN: 6
Chloride: 112
Creatinine, Ser: 0.66
GFR calc Af Amer: >60
Total Bilirubin: 0.3
Total Protein: 6.1

## 2011-07-18 LAB — CBC
MCV: 96
Platelets: 299
RDW: 13.6
WBC: 12.2 — ABNORMAL HIGH

## 2011-07-18 LAB — PROTEIN AND GLUCOSE, CSF: Total  Protein, CSF: 29

## 2011-07-18 LAB — AFB CULTURE WITH SMEAR (NOT AT ARMC)

## 2011-07-18 LAB — PROTEIN, BODY FLUID

## 2011-07-18 LAB — CULTURE, BLOOD (ROUTINE X 2): Report Status: 10262008

## 2011-07-18 LAB — GRAM STAIN

## 2011-07-20 LAB — URINALYSIS, ROUTINE W REFLEX MICROSCOPIC
Bilirubin Urine: NEGATIVE
Glucose, UA: NEGATIVE
Hgb urine dipstick: NEGATIVE
Specific Gravity, Urine: 1.01
pH: 6.5

## 2011-07-20 LAB — DIFFERENTIAL
Lymphocytes Relative: 11 — ABNORMAL LOW
Lymphs Abs: 1.4
Monocytes Absolute: 1.2 — ABNORMAL HIGH
Monocytes Relative: 9
Neutro Abs: 9.8 — ABNORMAL HIGH

## 2011-07-20 LAB — COMPREHENSIVE METABOLIC PANEL
Albumin: 3.7
Alkaline Phosphatase: 72
BUN: 23
Calcium: 9
Creatinine, Ser: 0.67
Potassium: 3.5
Total Protein: 6.1

## 2011-07-20 LAB — CBC
HCT: 37
Platelets: 347
RDW: 14.6 — ABNORMAL HIGH

## 2011-07-23 LAB — CBC
MCV: 92.1
Platelets: 283
RDW: 13.3
WBC: 7.1

## 2011-07-23 LAB — DIFFERENTIAL
Basophils Absolute: 0
Eosinophils Absolute: 0.3
Eosinophils Relative: 5
Lymphs Abs: 1.3
Neutrophils Relative %: 67

## 2011-07-23 LAB — BASIC METABOLIC PANEL
BUN: 15
Creatinine, Ser: 0.7
GFR calc non Af Amer: >60
Glucose, Bld: 66 — ABNORMAL LOW

## 2011-07-23 LAB — RAPID URINE DRUG SCREEN, HOSP PERFORMED
Amphetamines: POSITIVE — AB
Barbiturates: NOT DETECTED
Cocaine: NOT DETECTED
Opiates: NOT DETECTED
Tetrahydrocannabinol: NOT DETECTED

## 2011-07-28 ENCOUNTER — Encounter (HOSPITAL_COMMUNITY): Payer: Self-pay | Admitting: *Deleted

## 2011-07-28 ENCOUNTER — Emergency Department (HOSPITAL_COMMUNITY)
Admission: EM | Admit: 2011-07-28 | Discharge: 2011-07-28 | Disposition: A | Discharge status: Home / Self Care | Payer: PRIVATE HEALTH INSURANCE | Attending: Emergency Medicine | Admitting: Emergency Medicine

## 2011-07-28 DIAGNOSIS — Z87442 Personal history of urinary calculi: Secondary | ICD-10-CM | POA: Insufficient documentation

## 2011-07-28 DIAGNOSIS — G43909 Migraine, unspecified, not intractable, without status migrainosus: Secondary | ICD-10-CM | POA: Insufficient documentation

## 2011-07-28 DIAGNOSIS — K449 Diaphragmatic hernia without obstruction or gangrene: Secondary | ICD-10-CM | POA: Insufficient documentation

## 2011-07-28 DIAGNOSIS — F909 Attention-deficit hyperactivity disorder, unspecified type: Secondary | ICD-10-CM | POA: Insufficient documentation

## 2011-07-28 DIAGNOSIS — J45909 Unspecified asthma, uncomplicated: Secondary | ICD-10-CM | POA: Insufficient documentation

## 2011-07-28 DIAGNOSIS — Z8614 Personal history of Methicillin resistant Staphylococcus aureus infection: Secondary | ICD-10-CM | POA: Insufficient documentation

## 2011-07-28 DIAGNOSIS — R51 Headache: Secondary | ICD-10-CM

## 2011-07-28 DIAGNOSIS — F17200 Nicotine dependence, unspecified, uncomplicated: Secondary | ICD-10-CM | POA: Insufficient documentation

## 2011-07-28 DIAGNOSIS — IMO0002 Reserved for concepts with insufficient information to code with codable children: Secondary | ICD-10-CM | POA: Insufficient documentation

## 2011-07-28 MED ORDER — DIPHENHYDRAMINE HCL 50 MG/ML IJ SOLN
12.5000 mg | Freq: Once | INTRAMUSCULAR | Status: DC
  Filled 2011-07-28: qty 1

## 2011-07-28 MED ORDER — SODIUM CHLORIDE 0.9 % IV BOLUS (SEPSIS): 500.0000 mL | Freq: Once | INTRAVENOUS | Status: DC

## 2011-07-28 MED ORDER — DIPHENHYDRAMINE HCL 50 MG/ML IJ SOLN: 12.5000 mg | Freq: Once | INTRAMUSCULAR | Status: DC

## 2011-07-28 MED ORDER — PROMETHAZINE HCL 25 MG/ML IJ SOLN
25.0000 mg | Freq: Once | INTRAMUSCULAR | Status: DC
  Filled 2011-07-28: qty 1

## 2011-07-28 MED ORDER — KETOROLAC TROMETHAMINE 30 MG/ML IJ SOLN: 30.0000 mg | Freq: Once | INTRAMUSCULAR | Status: DC

## 2011-07-28 MED ORDER — KETOROLAC TROMETHAMINE 30 MG/ML IJ SOLN
30.0000 mg | Freq: Once | INTRAMUSCULAR | Status: DC
  Filled 2011-07-28: qty 1

## 2011-07-28 NOTE — ED Notes (Signed)
Patient refusing ordered medications-stating "Toradol doesn't work for me and I've had benadryl and phenergan at home. They haven't done anything. If I'm going to be stuck again. I want something that's going to work." EDP made aware, EDP to room to talk to patient.

## 2011-07-28 NOTE — ED Provider Notes (Signed)
History     CSN: [id number] Arrival date & time: 07/28/2011  1:23 PM   First MD Initiated Contact with Patient 07/28/11 1330      Chief Complaint  Patient presents with  . Migraine    (Consider location/radiation/quality/duration/timing/severity/associated sxs/prior treatment) Patient is a 50 y.o. female presenting with migraine. The history is provided by the patient.  Migraine This is a recurrent problem. The current episode started more than 2 days ago. Associated symptoms include headaches. Pertinent negatives include no chest pain, no abdominal pain and no shortness of breath. The symptoms are relieved by nothing.   patient comes to the ER for a headache. She has history of headaches that have been improved since she started on Topamax. She states that where she lives is getting redone using an approximate work on the floor. She states a box he is giving her headaches. His left-sided sharp throbbing. She has photophobia and nausea. No numbness or weakness. No relief with her medicines at home. This is a typical headache for her. No chest pain or trouble breathing. No fevers.  Past Medical History  Diagnosis Date  . S/P colonoscopy 06/02/09    normal (Dr. Linna Darner)  . PUD (peptic ulcer disease) 01/2007    EGD Dr Jena Gauss, 2 antral ulcers, negative h pylori  . MRSA infection     Dr. Lenice Pressman, currently under treatment  . Hiatal hernia 2008    on EGD above  . Asthma   . PTSD (post-traumatic stress disorder)   . Migraines   . Degenerative disc disease   . Rectal prolapse   . SBO (small bowel obstruction)   . ADHD (attention deficit hyperactivity disorder)   . S/P endoscopy 03/26/11    gastritis-Dr Darrick Penna  . Clostridium difficile colitis 04/10/11    tx w/ flagyl  . Kidney stones     Past Surgical History  Procedure Date  . Bowel resection     x2, secondary to adhesions  . Appendectomy   . Laparoscopic lysis intestinal adhesions   . Back surgery   . Abdominal  hysterectomy   . Tubal ligation   . Umbilical hernia repair     x5  . Neck surgery   . Partial hysterectomy     Family History  Problem Relation Age of Onset  . Adopted: Yes    History  Substance Use Topics  . Smoking status: Current Everyday Smoker -- 0.5 packs/day for 1 years    Types: Cigarettes  . Smokeless tobacco: Never Used  . Alcohol Use: No    OB History    Grav Para Term Preterm Abortions TAB SAB Ect Mult Living   1 1 1       1       Review of Systems  Constitutional: Negative for fever and fatigue.  HENT: Negative for neck pain and tinnitus.   Eyes: Positive for photophobia and pain.  Respiratory: Negative for chest tightness and shortness of breath.   Cardiovascular: Negative for chest pain.  Gastrointestinal: Positive for nausea. Negative for abdominal pain and abdominal distention.  Genitourinary: Negative for dysuria.  Musculoskeletal: Negative for back pain.  Neurological: Positive for headaches. Negative for seizures and light-headedness.  Hematological: Negative for adenopathy.  Psychiatric/Behavioral: Negative for confusion.    Allergies  Penicillins; Sulfa antibiotics; Aspirin; and Morphine and related  Home Medications   Current Outpatient Rx  Name Route Sig Dispense Refill  . ALBUTEROL 90 MCG/ACT IN AERS Inhalation Inhale 2 puffs into the lungs every 6 (six)  hours as needed. For shortness of breath     . ALPRAZOLAM 1 MG PO TABS Oral Take 1 mg by mouth 4 (four) times daily as needed. Anxiety    . AMPHETAMINE-DEXTROAMPHETAMINE 15 MG PO CP24 Oral Take 15 mg by mouth 2 (two) times daily.     . AMPHETAMINE-DEXTROAMPHETAMINE 30 MG PO TABS Oral Take 30 mg by mouth 2 (two) times daily as needed. When needed for ADHD     . BACLOFEN 10 MG PO TABS Oral Take 10 mg by mouth as needed. For migraine    . DEXLANSOPRAZOLE 60 MG PO CPDR Oral Take 60 mg by mouth daily.     . DULOXETINE HCL 60 MG PO CPEP Oral Take 120 mg by mouth daily.     Marland Kitchen GABAPENTIN 300 MG  PO CAPS Oral Take 600-900 mg by mouth 2 (two) times daily. Takes 600 mg every morning and 900 mg in the evening    . ACIDOPHILUS PO CAPS Oral Take 2 capsules by mouth 2 (two) times daily with breakfast and lunch. Patient unsure of correct strength, she buys this over the counter    . LIDOCAINE 5 % EX PTCH Transdermal Place 1 patch onto the skin daily as needed. Uses for pain in back. Not used regularly.     . MULTIVITAL PO TABS Oral Take 1 tablet by mouth daily.      Marland Kitchen ONDANSETRON HCL 4 MG PO TABS Oral Take 4 mg by mouth every 8 (eight) hours as needed. For nausea     . PROMETHAZINE HCL 25 MG PO TABS Oral Take 25 mg by mouth every 6 (six) hours as needed. For nausea     . TOPIRAMATE 25 MG PO TABS Oral Take 25 mg by mouth 3 (three) times daily.     . TRAMADOL HCL 50 MG PO TABS Oral Take 50 mg by mouth every 6 (six) hours as needed. Pain     . UNKNOWN TO PATIENT  Doxycycline po for chronic MRSA sinusitis        BP 107/76  Pulse 80  Temp(Src) 98.5 F (36.9 C) (Oral)  Resp 20  Ht 5\' 5"  (1.651 m)  Wt 155 lb (70.308 kg)  BMI 25.79 kg/m2  SpO2 100%  Physical Exam  Nursing note and vitals reviewed. Constitutional: She is oriented to person, place, and time. She appears well-developed and well-nourished.  HENT:  Head: Normocephalic and atraumatic.  Eyes: EOM are normal. Pupils are equal, round, and reactive to light.  Neck: Normal range of motion. Neck supple.  Cardiovascular: Normal rate, regular rhythm and normal heart sounds.   No murmur heard. Pulmonary/Chest: Effort normal. No respiratory distress. She has no wheezes. She has no rales.       Mildly harsh breath sounds  Abdominal: Soft. Bowel sounds are normal. She exhibits no distension. There is no tenderness. There is no rebound and no guarding.  Musculoskeletal: Normal range of motion.  Neurological: She is alert and oriented to person, place, and time. No cranial nerve deficit.  Skin: Skin is warm and dry.  Psychiatric: She has a  normal mood and affect. Her speech is normal.    ED Course  Procedures (including critical care time)  Labs Reviewed - No data to display No results found.   1. Headache       MDM  Headache for the last couple days. She states that chemicals at home are making it worse. We were unable to obtain IV access on her  and she didn't want any IM medications. She states that she'll take her medicines at home. She asked for narcotics for the headache. I told her not regular give narcotics for migraines. She states that she will go home.        Juliet Rude. Rubin Payor, MD 07/28/11 [phone]

## 2011-07-28 NOTE — ED Notes (Signed)
Pt c/o migraine to right side of head x 2 days

## 2011-07-28 NOTE — ED Notes (Signed)
Multiple unsuccessful IV attempts tried by EMS students, Vernell Barrier RN, and [employer] RN. EDP made aware ordered medications route changed to IM.

## 2011-08-10 ENCOUNTER — Other Ambulatory Visit: Payer: Self-pay | Admitting: Internal Medicine

## 2011-08-10 ENCOUNTER — Ambulatory Visit
Admission: RE | Admit: 2011-08-10 | Discharge: 2011-08-10 | Disposition: A | Discharge status: Home / Self Care | Payer: PRIVATE HEALTH INSURANCE | Source: Ambulatory Visit | Attending: Internal Medicine | Admitting: Internal Medicine

## 2011-08-10 DIAGNOSIS — W19XXXA Unspecified fall, initial encounter: Secondary | ICD-10-CM

## 2011-10-04 ENCOUNTER — Other Ambulatory Visit: Payer: Self-pay

## 2011-10-04 ENCOUNTER — Encounter (HOSPITAL_COMMUNITY): Payer: Self-pay

## 2011-10-04 ENCOUNTER — Emergency Department (HOSPITAL_COMMUNITY)
Admission: EM | Admit: 2011-10-04 | Discharge: 2011-10-04 | Disposition: A | Discharge status: Home / Self Care | Payer: PRIVATE HEALTH INSURANCE | Attending: Emergency Medicine | Admitting: Emergency Medicine

## 2011-10-04 ENCOUNTER — Emergency Department (HOSPITAL_COMMUNITY): Payer: PRIVATE HEALTH INSURANCE

## 2011-10-04 DIAGNOSIS — F909 Attention-deficit hyperactivity disorder, unspecified type: Secondary | ICD-10-CM | POA: Insufficient documentation

## 2011-10-04 DIAGNOSIS — K279 Peptic ulcer, site unspecified, unspecified as acute or chronic, without hemorrhage or perforation: Secondary | ICD-10-CM | POA: Insufficient documentation

## 2011-10-04 DIAGNOSIS — M549 Dorsalgia, unspecified: Secondary | ICD-10-CM | POA: Insufficient documentation

## 2011-10-04 DIAGNOSIS — K449 Diaphragmatic hernia without obstruction or gangrene: Secondary | ICD-10-CM | POA: Insufficient documentation

## 2011-10-04 DIAGNOSIS — Z87442 Personal history of urinary calculi: Secondary | ICD-10-CM | POA: Insufficient documentation

## 2011-10-04 DIAGNOSIS — IMO0002 Reserved for concepts with insufficient information to code with codable children: Secondary | ICD-10-CM | POA: Insufficient documentation

## 2011-10-04 DIAGNOSIS — F4310 Post-traumatic stress disorder, unspecified: Secondary | ICD-10-CM | POA: Insufficient documentation

## 2011-10-04 DIAGNOSIS — R079 Chest pain, unspecified: Secondary | ICD-10-CM | POA: Insufficient documentation

## 2011-10-04 DIAGNOSIS — G43909 Migraine, unspecified, not intractable, without status migrainosus: Secondary | ICD-10-CM | POA: Insufficient documentation

## 2011-10-04 DIAGNOSIS — Z7982 Long term (current) use of aspirin: Secondary | ICD-10-CM | POA: Insufficient documentation

## 2011-10-04 DIAGNOSIS — J45909 Unspecified asthma, uncomplicated: Secondary | ICD-10-CM | POA: Insufficient documentation

## 2011-10-04 DIAGNOSIS — Z8614 Personal history of Methicillin resistant Staphylococcus aureus infection: Secondary | ICD-10-CM | POA: Insufficient documentation

## 2011-10-04 DIAGNOSIS — G8929 Other chronic pain: Secondary | ICD-10-CM | POA: Insufficient documentation

## 2011-10-04 LAB — TROPONIN I: Troponin I: <0.3 ng/mL (ref <0.30)

## 2011-10-04 LAB — D-DIMER, QUANTITATIVE: D-Dimer, Quant: 0.37 ug/mL-FEU (ref 0.00–0.48)

## 2011-10-04 MED ORDER — OXYCODONE-ACETAMINOPHEN 5-325 MG PO TABS
1.0000 | ORAL_TABLET | Freq: Once | ORAL | Status: AC
  Administered 2011-10-04: 1 via ORAL
  Filled 2011-10-04: qty 1

## 2011-10-04 MED ORDER — OXYCODONE-ACETAMINOPHEN 5-325 MG PO TABS: 2.0000 | ORAL_TABLET | ORAL | Status: AC | PRN

## 2011-10-04 NOTE — ED Notes (Signed)
Pt c/o mid back pain x 1 month.  Has been taking baclofen, tramadol, and asprin but says isn't helping.

## 2011-10-04 NOTE — ED Notes (Signed)
Pt c/o chest pain, EDPa Miller notified. EKG in progress.

## 2011-10-04 NOTE — ED Provider Notes (Signed)
Medical screening examination/treatment/procedure(s) were performed by non-physician practitioner and as supervising physician I was immediately available for consultation/collaboration.   Milo Solana L Alpha Chouinard, MD 10/04/11 1525 

## 2011-10-04 NOTE — ED Provider Notes (Signed)
History     CSN: [id number]  Arrival date & time 10/04/11  1129   First MD Initiated Contact with Patient 10/04/11 1259      Chief Complaint  Patient presents with  . Back Pain    (Consider location/radiation/quality/duration/timing/severity/associated sxs/prior treatment) Patient is a 50 y.o. female presenting with back pain. The history is provided by the patient.  Back Pain  This is a new problem. The problem occurs constantly. The problem has not changed since onset.The pain is associated with no known injury. The pain is present in the thoracic spine. The quality of the pain is described as stabbing. The pain is at a severity of 8/10. The pain is the same all the time. Associated symptoms include chest pain. Pertinent negatives include no fever, no numbness, no weight loss and no headaches. She has tried muscle relaxants for the symptoms.    Past Medical History  Diagnosis Date  . S/P colonoscopy 06/02/09    normal (Dr. Linna Darner)  . PUD (peptic ulcer disease) 01/2007    EGD Dr Jena Gauss, 2 antral ulcers, negative h pylori  . MRSA infection     Dr. Lenice Pressman, currently under treatment  . Hiatal hernia 2008    on EGD above  . Asthma   . PTSD (post-traumatic stress disorder)   . Migraines   . Degenerative disc disease   . Rectal prolapse   . SBO (small bowel obstruction)   . ADHD (attention deficit hyperactivity disorder)   . S/P endoscopy 03/26/11    gastritis-Dr Darrick Penna  . Clostridium difficile colitis 04/10/11    tx w/ flagyl  . Kidney stones     Past Surgical History  Procedure Date  . Bowel resection     x2, secondary to adhesions  . Appendectomy   . Laparoscopic lysis intestinal adhesions   . Back surgery   . Abdominal hysterectomy   . Tubal ligation   . Umbilical hernia repair     x5  . Neck surgery   . Partial hysterectomy     Family History  Problem Relation Age of Onset  . Adopted: Yes    History  Substance Use Topics  . Smoking status:  Current Everyday Smoker -- 0.5 packs/day for 1 years    Types: Cigarettes  . Smokeless tobacco: Never Used  . Alcohol Use: No    OB History    Grav Para Term Preterm Abortions TAB SAB Ect Mult Living   1 1 1       1       Review of Systems  Constitutional: Negative for fever and weight loss.  Cardiovascular: Positive for chest pain.  Musculoskeletal: Positive for back pain.  Neurological: Negative for numbness and headaches.  All other systems reviewed and are negative.    Allergies  Penicillins; Sulfa antibiotics; Aspirin; Morphine and related; and Other  Home Medications   Current Outpatient Rx  Name Route Sig Dispense Refill  . ALBUTEROL 90 MCG/ACT IN AERS Inhalation Inhale 2 puffs into the lungs every 6 (six) hours as needed. For shortness of breath     . ALPRAZOLAM 1 MG PO TABS Oral Take 1 mg by mouth 4 (four) times daily as needed. Anxiety    . ASPIRIN 325 MG PO TABS Oral Take 650 mg by mouth every 6 (six) hours as needed. For pain     . BACLOFEN 10 MG PO TABS Oral Take 10 mg by mouth as needed. For pain    . DEXLANSOPRAZOLE 60 MG PO  CPDR Oral Take 60 mg by mouth daily.     . DULOXETINE HCL 60 MG PO CPEP Oral Take 120 mg by mouth daily.     Marland Kitchen GABAPENTIN 300 MG PO CAPS Oral Take 600-900 mg by mouth 2 (two) times daily. Takes 600 mg every morning and 900 mg in the evening    . ACIDOPHILUS PO CAPS Oral Take 2 capsules by mouth 2 (two) times daily with breakfast and lunch. Patient unsure of correct strength, she buys this over the counter    . LIDOCAINE 5 % EX PTCH Transdermal Place 1 patch onto the skin daily as needed. Uses for pain in back. Not used regularly.     . ADULT MULTIVITAMIN W/MINERALS CH Oral Take 1 tablet by mouth daily.      . TOPIRAMATE 25 MG PO TABS Oral Take 25 mg by mouth 3 (three) times daily.     . TRAMADOL HCL 50 MG PO TABS Oral Take 50 mg by mouth every 6 (six) hours as needed. Pain       BP 103/72  Pulse 78  Temp(Src) 99.2 F (37.3 C) (Oral)   Resp 18  Ht 5\' 5"  (1.651 m)  Wt 154 lb (69.854 kg)  BMI 25.63 kg/m2  SpO2 98%  Physical Exam  Nursing note and vitals reviewed. Constitutional: She is oriented to person, place, and time. She appears well-developed and well-nourished. No distress.  HENT:  Head: Normocephalic and atraumatic.  Eyes: EOM are normal.  Neck: Normal range of motion.  Cardiovascular: Normal rate, regular rhythm and normal heart sounds.   Pulmonary/Chest: Effort normal and breath sounds normal.  Abdominal: Soft. She exhibits no distension. There is no tenderness.  Musculoskeletal: Normal range of motion.       Arms: Neurological: She is alert and oriented to person, place, and time.  Skin: Skin is warm and dry.  Psychiatric: She has a normal mood and affect. Judgment normal.    ED Course  Procedures (including critical care time)   Labs Reviewed  D-DIMER, QUANTITATIVE  TROPONIN I   Dg Chest 2 View  10/04/2011  *RADIOLOGY REPORT*  Clinical Data: 50 year old female with chest pain.  CHEST - 2 VIEW  Comparison: 08/21/2010 and prior chest radiographs  Findings: The cardiomediastinal silhouette is unremarkable. Scarring at the left lung base is again noted. There is no evidence of focal airspace disease, pulmonary edema, pulmonary nodule/mass, pleural effusion, or pneumothorax. No acute bony abnormalities are identified. Prior lower cervical surgery again identified.  IMPRESSION: No evidence of acute cardiopulmonary disease.  Left basilar scarring.  Original Report Authenticated By: Rosendo Gros, M.D.     No diagnosis found.    MDM          Worthy Rancher, PA 10/04/11 [phone]

## 2011-10-08 ENCOUNTER — Emergency Department (HOSPITAL_COMMUNITY)
Admission: EM | Admit: 2011-10-08 | Discharge: 2011-10-08 | Disposition: A | Discharge status: Home / Self Care | Payer: PRIVATE HEALTH INSURANCE | Attending: Emergency Medicine | Admitting: Emergency Medicine

## 2011-10-08 ENCOUNTER — Encounter (HOSPITAL_COMMUNITY): Payer: Self-pay | Admitting: *Deleted

## 2011-10-08 DIAGNOSIS — M549 Dorsalgia, unspecified: Secondary | ICD-10-CM

## 2011-10-08 DIAGNOSIS — J45909 Unspecified asthma, uncomplicated: Secondary | ICD-10-CM | POA: Insufficient documentation

## 2011-10-08 DIAGNOSIS — G8929 Other chronic pain: Secondary | ICD-10-CM | POA: Insufficient documentation

## 2011-10-08 DIAGNOSIS — Z79899 Other long term (current) drug therapy: Secondary | ICD-10-CM | POA: Insufficient documentation

## 2011-10-08 DIAGNOSIS — M546 Pain in thoracic spine: Secondary | ICD-10-CM | POA: Insufficient documentation

## 2011-10-08 DIAGNOSIS — IMO0002 Reserved for concepts with insufficient information to code with codable children: Secondary | ICD-10-CM | POA: Insufficient documentation

## 2011-10-08 DIAGNOSIS — Z9889 Other specified postprocedural states: Secondary | ICD-10-CM | POA: Insufficient documentation

## 2011-10-08 DIAGNOSIS — F17200 Nicotine dependence, unspecified, uncomplicated: Secondary | ICD-10-CM | POA: Insufficient documentation

## 2011-10-08 MED ORDER — PREDNISONE 20 MG PO TABS
60.0000 mg | ORAL_TABLET | Freq: Once | ORAL | Status: AC
  Administered 2011-10-08: 60 mg via ORAL
  Filled 2011-10-08: qty 3

## 2011-10-08 MED ORDER — OXYCODONE-ACETAMINOPHEN 5-325 MG PO TABS: 1.0000 | ORAL_TABLET | Freq: Four times a day (QID) | ORAL | Status: AC | PRN

## 2011-10-08 MED ORDER — OXYCODONE-ACETAMINOPHEN 5-325 MG PO TABS
2.0000 | ORAL_TABLET | Freq: Once | ORAL | Status: AC
  Administered 2011-10-08: 2 via ORAL
  Filled 2011-10-08 (×2): qty 1

## 2011-10-08 MED ORDER — PREDNISONE 50 MG PO TABS: 50.0000 mg | ORAL_TABLET | Freq: Every day | ORAL | Status: AC

## 2011-10-08 MED ORDER — DIAZEPAM 5 MG PO TABS: 5.0000 mg | ORAL_TABLET | Freq: Three times a day (TID) | ORAL | Status: AC | PRN

## 2011-10-08 NOTE — ED Notes (Signed)
Reports mid back pain under shoulder blades for extended amount of time, became more severe x 3 days, no relief with vicodin. Ambulatory at triage.

## 2011-10-08 NOTE — ED Provider Notes (Signed)
History     CSN: [id number]  Arrival date & time 10/08/11  1004   First MD Initiated Contact with Patient 10/08/11 1030      Chief Complaint  Patient presents with  . Back Pain    (Consider location/radiation/quality/duration/timing/severity/associated sxs/prior treatment) HPI Patient has emergency department with chronic back pain.  She states that she was seen at [facility] several days ago.  She states they did testing for her heart and lungs for blood clot. Patient continues to have pain. No CP, SOB, weakness, numbness, N/V/D, or abdominal pain. Past Medical History  Diagnosis Date  . S/P colonoscopy 06/02/09    normal (Dr. Linna Darner)  . PUD (peptic ulcer disease) 01/2007    EGD Dr Jena Gauss, 2 antral ulcers, negative h pylori  . MRSA infection     Dr. Lenice Pressman, currently under treatment  . Hiatal hernia 2008    on EGD above  . Asthma   . PTSD (post-traumatic stress disorder)   . Migraines   . Degenerative disc disease   . Rectal prolapse   . SBO (small bowel obstruction)   . ADHD (attention deficit hyperactivity disorder)   . S/P endoscopy 03/26/11    gastritis-Dr Darrick Penna  . Clostridium difficile colitis 04/10/11    tx w/ flagyl  . Kidney stones     Past Surgical History  Procedure Date  . Bowel resection     x2, secondary to adhesions  . Appendectomy   . Laparoscopic lysis intestinal adhesions   . Back surgery   . Abdominal hysterectomy   . Tubal ligation   . Umbilical hernia repair     x5  . Neck surgery   . Partial hysterectomy     Family History  Problem Relation Age of Onset  . Adopted: Yes    History  Substance Use Topics  . Smoking status: Current Everyday Smoker -- 0.5 packs/day for 1 years    Types: Cigarettes  . Smokeless tobacco: Never Used  . Alcohol Use: No    OB History    Grav Para Term Preterm Abortions TAB SAB Ect Mult Living   1 1 1       1       Review of Systems All pertinent positives and negatives reviewed in  the history of present illness  Allergies  Penicillins; Sulfa antibiotics; Aspirin; Ibuprofen; Morphine and related; and Other  Home Medications   Current Outpatient Rx  Name Route Sig Dispense Refill  . ALBUTEROL 90 MCG/ACT IN AERS Inhalation Inhale 2 puffs into the lungs every 6 (six) hours as needed. For shortness of breath    . ALPRAZOLAM 1 MG PO TABS Oral Take 1 mg by mouth 4 (four) times daily as needed. Anxiety    . AMPHETAMINE-DEXTROAMPHET ER 15 MG PO CP24 Oral Take 15 mg by mouth 2 (two) times daily.      Marland Kitchen BACLOFEN 10 MG PO TABS Oral Take 10 mg by mouth as needed. For pain    . DEXLANSOPRAZOLE 60 MG PO CPDR Oral Take 60 mg by mouth daily.     . DULOXETINE HCL 60 MG PO CPEP Oral Take 120 mg by mouth daily.     Marland Kitchen GABAPENTIN 300 MG PO CAPS Oral Take 600-900 mg by mouth 2 (two) times daily. Takes 600 mg every morning and 900 mg in the evening    . ACIDOPHILUS PO CAPS Oral Take 2 capsules by mouth 2 (two) times daily with breakfast and lunch. Patient unsure of correct  strength, she buys this over the counter    . LIDOCAINE 5 % EX PTCH Transdermal Place 1 patch onto the skin daily as needed. Uses for pain in back. Not used regularly.    . ADULT MULTIVITAMIN W/MINERALS CH Oral Take 1 tablet by mouth daily.      . TOPIRAMATE 25 MG PO TABS Oral Take 25 mg by mouth 3 (three) times daily.     . TRAMADOL HCL 50 MG PO TABS Oral Take 50 mg by mouth every 6 (six) hours as needed. Pain     . OXYCODONE-ACETAMINOPHEN 5-325 MG PO TABS Oral Take 2 tablets by mouth every 4 (four) hours as needed for pain. 15 tablet 0    BP 119/74  Pulse 82  Temp(Src) 98 F (36.7 C) (Oral)  Resp 16  SpO2 95%  Physical Exam  Constitutional: She appears well-developed and well-nourished. No distress.  HENT:  Head: Normocephalic and atraumatic.  Eyes: Pupils are equal, round, and reactive to light.  Cardiovascular: Normal rate, regular rhythm and normal heart sounds.  Exam reveals no gallop and no friction rub.    No murmur heard. Pulmonary/Chest: Effort normal and breath sounds normal. No respiratory distress. She has no wheezes. She has no rales.  Musculoskeletal:       Thoracic back: She exhibits tenderness. She exhibits normal range of motion, no bony tenderness and no spasm.       Back:    ED Course  Procedures (including critical care time)       The patient has no neurodeficits noted on exam. She has normal reflexes on exam. Told to use ice and heat on her back. She has a specialist that she sees for her back and is told to follow up with him.Reeturn here as needed.   MDM  Chronic thoracic back pain based on her HPI and PE.         Carlyle Dolly, PA-C 10/15/11 [phone]

## 2011-10-08 NOTE — ED Notes (Signed)
Patient states she has degenerative disk disease,  She complains of pain in the t6 and t7 vertebrae

## 2011-10-11 ENCOUNTER — Other Ambulatory Visit (HOSPITAL_COMMUNITY): Payer: Self-pay | Admitting: Specialist

## 2011-10-11 DIAGNOSIS — R52 Pain, unspecified: Secondary | ICD-10-CM

## 2011-10-17 NOTE — ED Provider Notes (Signed)
Medical screening examination/treatment/procedure(s) were performed by non-physician practitioner and as supervising physician I was immediately available for consultation/collaboration.  Apryll Hinkle T Shavone Nevers, MD 10/17/11 0947 

## 2011-10-18 ENCOUNTER — Ambulatory Visit (HOSPITAL_COMMUNITY)
Admission: RE | Admit: 2011-10-18 | Discharge: 2011-10-18 | Disposition: A | Discharge status: Home / Self Care | Payer: PRIVATE HEALTH INSURANCE | Source: Ambulatory Visit | Attending: Specialist | Admitting: Specialist

## 2011-10-18 DIAGNOSIS — M546 Pain in thoracic spine: Secondary | ICD-10-CM | POA: Insufficient documentation

## 2011-10-18 DIAGNOSIS — R52 Pain, unspecified: Secondary | ICD-10-CM

## 2011-11-15 ENCOUNTER — Encounter (HOSPITAL_COMMUNITY): Payer: Self-pay

## 2011-11-15 ENCOUNTER — Emergency Department (HOSPITAL_COMMUNITY)
Admission: EM | Admit: 2011-11-15 | Discharge: 2011-11-15 | Disposition: A | Discharge status: Home / Self Care | Payer: PRIVATE HEALTH INSURANCE | Attending: Emergency Medicine | Admitting: Emergency Medicine

## 2011-11-15 DIAGNOSIS — R5383 Other fatigue: Secondary | ICD-10-CM | POA: Insufficient documentation

## 2011-11-15 DIAGNOSIS — R197 Diarrhea, unspecified: Secondary | ICD-10-CM

## 2011-11-15 DIAGNOSIS — R109 Unspecified abdominal pain: Secondary | ICD-10-CM | POA: Insufficient documentation

## 2011-11-15 DIAGNOSIS — F17200 Nicotine dependence, unspecified, uncomplicated: Secondary | ICD-10-CM | POA: Insufficient documentation

## 2011-11-15 DIAGNOSIS — IMO0002 Reserved for concepts with insufficient information to code with codable children: Secondary | ICD-10-CM | POA: Insufficient documentation

## 2011-11-15 DIAGNOSIS — R6883 Chills (without fever): Secondary | ICD-10-CM | POA: Insufficient documentation

## 2011-11-15 DIAGNOSIS — Z79899 Other long term (current) drug therapy: Secondary | ICD-10-CM | POA: Insufficient documentation

## 2011-11-15 DIAGNOSIS — J45909 Unspecified asthma, uncomplicated: Secondary | ICD-10-CM | POA: Insufficient documentation

## 2011-11-15 DIAGNOSIS — R5381 Other malaise: Secondary | ICD-10-CM | POA: Insufficient documentation

## 2011-11-15 DIAGNOSIS — Z87442 Personal history of urinary calculi: Secondary | ICD-10-CM | POA: Insufficient documentation

## 2011-11-15 DIAGNOSIS — F909 Attention-deficit hyperactivity disorder, unspecified type: Secondary | ICD-10-CM | POA: Insufficient documentation

## 2011-11-15 DIAGNOSIS — R42 Dizziness and giddiness: Secondary | ICD-10-CM | POA: Insufficient documentation

## 2011-11-15 LAB — DIFFERENTIAL
Basophils Absolute: 0.1 10*3/uL (ref 0.0–0.1)
Basophils Relative: 1 % (ref 0–1)
Lymphocytes Relative: 27 % (ref 12–46)
Monocytes Absolute: 1 10*3/uL (ref 0.1–1.0)
Neutro Abs: 6 10*3/uL (ref 1.7–7.7)
Neutrophils Relative %: 60 % (ref 43–77)

## 2011-11-15 LAB — BASIC METABOLIC PANEL
CO2: 22 mEq/L (ref 19–32)
Chloride: 107 mEq/L (ref 96–112)
Creatinine, Ser: 0.63 mg/dL (ref 0.50–1.10)
GFR calc Af Amer: >90 mL/min (ref >90)
Potassium: 3.7 mEq/L (ref 3.5–5.1)

## 2011-11-15 LAB — CBC
HCT: 38.6 % (ref 36.0–46.0)
Hemoglobin: 12.6 g/dL (ref 12.0–15.0)
RDW: 13.8 % (ref 11.5–15.5)
WBC: 9.9 10*3/uL (ref 4.0–10.5)

## 2011-11-15 MED ORDER — ONDANSETRON 8 MG PO TBDP
8.0000 mg | ORAL_TABLET | Freq: Once | ORAL | Status: AC
  Administered 2011-11-15: 8 mg via ORAL
  Filled 2011-11-15: qty 1

## 2011-11-15 NOTE — ED Notes (Signed)
Aching and diarrhea x three days

## 2011-11-15 NOTE — ED Provider Notes (Signed)
History   This chart was scribed for Chelsea Gaskins, MD by Clarita Crane. The patient was seen in room APA06/APA06 and the patient's care was started at 11:27AM.   CSN: [id number]  Arrival date & time 11/15/11  1040   First MD Initiated Contact with Patient 11/15/11 1103      Chief Complaint  Patient presents with  . Diarrhea     HPI [name] is a 51 y.o. female who presents to the Emergency Department complaining of constant severe diarrhea onset 3 days ago and persistent since with associated chills, abdominal pain, generalized weakness, dizziness described as lightheadedness. Patient notes minimal relief with use of imodium and pepto bismol. Denies nausea, vomiting, hematochezia. Denies abx use and recent extensive travel. Patient with h/o PUD, small bowel obstruction, bowel resection, appendectomy, c. diff colitis.  Past Medical History  Diagnosis Date  . S/P colonoscopy 06/02/09    normal (Dr. Linna Darner)  . PUD (peptic ulcer disease) 01/2007    EGD Dr Jena Gauss, 2 antral ulcers, negative h pylori  . MRSA infection     Dr. Lenice Pressman, currently under treatment  . Hiatal hernia 2008    on EGD above  . Asthma   . PTSD (post-traumatic stress disorder)   . Migraines   . Degenerative disc disease   . Rectal prolapse   . SBO (small bowel obstruction)   . ADHD (attention deficit hyperactivity disorder)   . S/P endoscopy 03/26/11    gastritis-Dr Darrick Penna  . Clostridium difficile colitis 04/10/11    tx w/ flagyl  . Kidney stones     Past Surgical History  Procedure Date  . Bowel resection     x2, secondary to adhesions  . Appendectomy   . Laparoscopic lysis intestinal adhesions   . Back surgery   . Abdominal hysterectomy   . Tubal ligation   . Umbilical hernia repair     x5  . Neck surgery   . Partial hysterectomy     Family History  Problem Relation Age of Onset  . Adopted: Yes    History  Substance Use Topics  . Smoking status: Current Everyday Smoker --  0.5 packs/day for 1 years    Types: Cigarettes  . Smokeless tobacco: Never Used  . Alcohol Use: No    OB History    Grav Para Term Preterm Abortions TAB SAB Ect Mult Living   1 1 1       1       Review of Systems 10 Systems reviewed and are negative for acute change except as noted in the HPI.  Allergies  Penicillins; Sulfa antibiotics; Aspirin; Ibuprofen; and Morphine and related  Home Medications   Current Outpatient Rx  Name Route Sig Dispense Refill  . ALBUTEROL 90 MCG/ACT IN AERS Inhalation Inhale 2 puffs into the lungs every 6 (six) hours as needed. For shortness of breath    . ALPRAZOLAM 1 MG PO TABS Oral Take 1 mg by mouth 4 (four) times daily as needed. Anxiety    . AMPHETAMINE-DEXTROAMPHET ER 15 MG PO CP24 Oral Take 15 mg by mouth 2 (two) times daily.      Marland Kitchen BACLOFEN 10 MG PO TABS Oral Take 10 mg by mouth as needed. For pain    . BISMUTH SUBSALICYLATE 262 MG/15ML PO SUSP Oral Take 15 mLs by mouth every 6 (six) hours as needed. Diarrhea    . DEXLANSOPRAZOLE 60 MG PO CPDR Oral Take 60 mg by mouth daily.     Marland Kitchen  DULOXETINE HCL 60 MG PO CPEP Oral Take 120 mg by mouth daily.     Marland Kitchen GABAPENTIN 300 MG PO CAPS Oral Take 600-900 mg by mouth 2 (two) times daily. Takes 600 mg every morning and 900 mg in the evening    . ACIDOPHILUS PO CAPS Oral Take 2 capsules by mouth 2 (two) times daily with breakfast and lunch.     Marland Kitchen LIDOCAINE 5 % EX PTCH Transdermal Place 1 patch onto the skin daily as needed. Uses for pain in back. Not used regularly.    Marland Kitchen LOPERAMIDE HCL 2 MG PO CAPS Oral Take 2 mg by mouth 4 (four) times daily as needed. Diarrhea    . ADULT MULTIVITAMIN W/MINERALS CH Oral Take 1 tablet by mouth daily.      . TOPIRAMATE 25 MG PO TABS Oral Take 25 mg by mouth 3 (three) times daily.     . TRAMADOL HCL 50 MG PO TABS Oral Take 50 mg by mouth every 6 (six) hours as needed. Pain       BP 95/68  Pulse 86  Temp(Src) 98.5 F (36.9 C) (Oral)  Resp 20  Ht 5\' 5"  (1.651 m)  Wt 152 lb 8  oz (69.174 kg)  BMI 25.38 kg/m2  SpO2 98%  Physical Exam CONSTITUTIONAL: Well developed/well nourished HEAD AND FACE: Normocephalic/atraumatic EYES: EOMI/PERRL ENMT: Mucous membranes moist NECK: supple no meningeal signs SPINE:entire spine nontender CV: S1/S2 noted, no murmurs/rubs/gallops noted LUNGS: Lungs are clear to auscultation bilaterally, no apparent distress ABDOMEN: soft, nontender, no rebound or guarding GU:no cva tenderness, chaperone present for rectal exam, stool color normal NEURO: Pt is awake/alert, moves all extremitiesx4 EXTREMITIES: pulses normal, full ROM SKIN: warm, color normal PSYCH: no abnormalities of mood noted  ED Course  Procedures   DIAGNOSTIC STUDIES: Oxygen Saturation is 98% on room air, normal by my interpretation.    COORDINATION OF CARE: 11:31AM- Patient informed of current plan for treatment and evaluation and agrees with plan at this time.  12:53PM- Patient informed of lab results and intent to d/c home. Patient agrees with plan set forth at this time.    Labs Reviewed  BASIC METABOLIC PANEL - Abnormal; Notable for the following:    Glucose, Bld 107 (*)    All other components within normal limits  CBC  DIFFERENTIAL     MDM  Nursing notes reviewed and considered in documentation All labs/vitals reviewed and considered       I personally performed the services described in this documentation, which was scribed in my presence. The recorded information has been reviewed and considered.     Chelsea Gaskins, MD 11/15/11 [phone]

## 2011-11-15 NOTE — ED Notes (Signed)
Given 8oz of water-tolerating with no c/o nausea.

## 2011-11-15 NOTE — ED Notes (Signed)
Left in c/o family for transport home; instructions reviewed and f/u information provided-verbalizes understanding.  Alert, in no distress.

## 2011-11-15 NOTE — ED Notes (Signed)
C/o diarrhea x 3 days; c/o nausea, denies vomiting; also c/o lower abd pain, describes as constant and throbbing. Reports adequate po intake; c/o feeling lightheaded.

## 2011-11-15 NOTE — ED Notes (Signed)
Reports dizziness with postural changes while assessing orthostatic VS.

## 2011-11-16 LAB — OCCULT BLOOD, POC DEVICE: Fecal Occult Bld: NEGATIVE

## 2011-11-20 ENCOUNTER — Telehealth: Payer: Self-pay | Admitting: Gastroenterology

## 2011-11-20 ENCOUNTER — Other Ambulatory Visit (HOSPITAL_COMMUNITY): Payer: Self-pay | Admitting: Specialist

## 2011-11-20 DIAGNOSIS — M545 Low back pain, unspecified: Secondary | ICD-10-CM

## 2011-11-20 NOTE — Telephone Encounter (Signed)
Patient has an appointment Thursday w/[facility] but shes asking for something for nausea  hasnt vomited but having a lot of nausea wants something until she can be seen uses [facility] please advise

## 2011-11-21 ENCOUNTER — Other Ambulatory Visit: Payer: Self-pay

## 2011-11-21 DIAGNOSIS — R197 Diarrhea, unspecified: Secondary | ICD-10-CM

## 2011-11-21 MED ORDER — PROMETHAZINE HCL 12.5 MG PO TABS: ORAL_TABLET | ORAL | Status: DC

## 2011-11-21 NOTE — Telephone Encounter (Signed)
Call pt. She needs to submit a stool study for CDIFF PCR.

## 2011-11-21 NOTE — Telephone Encounter (Signed)
CALL PT, RX SENT. SHE NEEDS TO SUBMIT STOOL TODAY FOR CDIFF PCR IF SHE IS HAVING DIARRHEA.

## 2011-11-21 NOTE — Telephone Encounter (Signed)
Called and informed pt. She said that she had some Flagyl left over and she took 3 tablets yesterday and has taken 2 today. She had been having diarrhea, but since she started the Flagyl she is feeling better and has not had any diarrhea today. Please advise!

## 2011-11-21 NOTE — Telephone Encounter (Signed)
Pt informed and stool container and lab order at front.

## 2011-11-22 ENCOUNTER — Ambulatory Visit (INDEPENDENT_AMBULATORY_CARE_PROVIDER_SITE_OTHER): Payer: PRIVATE HEALTH INSURANCE | Admitting: Gastroenterology

## 2011-11-22 ENCOUNTER — Encounter: Payer: Self-pay | Admitting: Gastroenterology

## 2011-11-22 ENCOUNTER — Other Ambulatory Visit: Payer: Self-pay | Admitting: Gastroenterology

## 2011-11-22 ENCOUNTER — Ambulatory Visit (HOSPITAL_COMMUNITY): Payer: PRIVATE HEALTH INSURANCE

## 2011-11-22 DIAGNOSIS — R197 Diarrhea, unspecified: Secondary | ICD-10-CM

## 2011-11-22 MED ORDER — METRONIDAZOLE 500 MG PO TABS: ORAL_TABLET | ORAL | Status: DC

## 2011-11-22 NOTE — Assessment & Plan Note (Signed)
FOR 1 WEEK-?RESPONSE TO FLAGYL. HX: CDIFF & IBS. Sx due to viral illness, IBS, C DIFF, and less likely microscopic colitis or celiac sprue.  SUBMIT STOOL STUDIES. COMPLETE 10 DAYS OF FLAGYL. USE PEPTO BISMOL AS NEEDED. RESTART PROBIOTICS. FOLLOW UP IN 3 WEEKS. IF Sx PERSIST, CONSIDER TTG/FLEX SIG. CALL IF SYMPTOMS ARE NOT BETTER IN 7 DAYS. FOLLOW A DIARY FREE DIET FOR 7 DAYS.

## 2011-11-22 NOTE — Progress Notes (Signed)
Subjective:    Patient ID: [name], female    DOB: 1960/11/28, 51 y.o.   MRN: [id number]  PCP: [facility]  HPI Achy upper left back pain: 1.5 mos. Diarrhea started: 1 week ago. Bms: o yesterday. Drinking a bottle of Pepto Bismol/DAY. IMODIUM: 4-5 TO START & now 2 this AM. No fever. Felt "chills" to mid-section. Usu. Hot and sweaty feeling. WENT TO ED LAST WEEK: FOR DIARRHEA for 3 days. Though she had a virus. Nausea: off and on, no vomiting. Abdominal pain: lower-off and on, crampy, can keep her awake at night: every other night. Up having a stool: ? Up to 10 times. No blood in stool. ABX: no. No NH/hospital visits. No travel or well water. Pain better, but diarrhea same. Started FLAGYL 2 DAYS AGO.  Past Medical History  Diagnosis Date  . S/P colonoscopy 06/02/09    normal (Dr. Linna Darner)  . PUD (peptic ulcer disease) 01/2007    EGD Dr Jena Gauss, 2 antral ulcers, negative h pylori  . MRSA infection     Dr. Lenice Pressman, currently under treatment  . Hiatal hernia 2008    on EGD above  . Asthma   . PTSD (post-traumatic stress disorder)   . Migraines   . Degenerative disc disease   . Rectal prolapse   . SBO (small bowel obstruction)   . ADHD (attention deficit hyperactivity disorder)   . S/P endoscopy 03/26/11    gastritis-Dr Darrick Penna  . Clostridium difficile colitis 04/10/11    tx w/ flagyl  . Kidney stones   . Chronic abdominal pain 2003    EX LAP APR 2004 RUPTURE L OV CYST, JUL 2004 ADHESIONS  . Irritable bowel syndrome 2004 CONSTIPATION  . BMI (body mass index) 20.0-29.9 2009 127 LBS    Past Surgical History  Procedure Date  . Bowel resection     x2, secondary to adhesions  . Appendectomy   . Laparoscopic lysis intestinal adhesions   . Back surgery   . Abdominal hysterectomy   . Tubal ligation   . Umbilical hernia repair 2008    x5  . Neck surgery 2009  . Partial hysterectomy   . Upper gastrointestinal endoscopy APR 2008 [facility] BLEEDING/PAIN    PUD  . Upper  gastrointestinal endoscopy JUL 2012 [facility] PAIN    MILD GASTRITIS   Allergies  Allergen Reactions  . Penicillins Anaphylaxis    REACTION: anaphylaxis  . Sulfa Antibiotics Itching  . Aspirin Other (See Comments)    Burning of stomach. REACTION: GI upset  . Ibuprofen Other (See Comments)    Upset stomach  . Morphine And Related Itching    Current Outpatient Prescriptions  Medication Sig Dispense Refill  . albuterol (PROVENTIL,VENTOLIN) 90 MCG/ACT inhaler Inhale 2 puffs into the lungs every 6 (six) hours as needed. For shortness of breath      . ALPRAZolam (XANAX) 1 MG tablet Take 1 mg by mouth 4 (four) times daily as needed. Anxiety      . amphetamine-dextroamphetamine (ADDERALL XR) 15 MG 24 hr capsule Take 15 mg by mouth 2 (two) times daily.        . baclofen (LIORESAL) 10 MG tablet Take 10 mg by mouth as needed. For pain      . bismuth subsalicylate (PEPTO BISMOL) 262 MG/15ML suspension Take 15 mLs by mouth every 6 (six) hours as needed. Diarrhea      . dexlansoprazole (DEXILANT) 60 MG capsule Take 60 mg by mouth daily.       Marland Lewis  DULoxetine (CYMBALTA) 60 MG capsule Take 120 mg by mouth daily.       Marland Lewis gabapentin (NEURONTIN) 300 MG capsule Take 600-900 mg by mouth 2 (two) times daily. Takes 600 mg every morning and 900 mg in the evening      . Lactobacillus (ACIDOPHILUS) CAPS Take 2 capsules by mouth 2 (two) times daily with breakfast and lunch.       . lidocaine (LIDODERM) 5 % Place 1 patch onto the skin daily as needed. Uses for pain in back. Not used regularly.      . loperamide (IMODIUM) 2 MG capsule Take 2 mg by mouth 4 (four) times daily as needed. Diarrhea      . Multiple Vitamin (MULITIVITAMIN WITH MINERALS) TABS Take 1 tablet by mouth daily.      . promethazine (PHENERGAN) 12.5 MG tablet 1-2 po q4-6 h prn nausea or vomiting    . topiramate (TOPAMAX) 25 MG tablet Take 25 mg by mouth 3 (three) times daily.       . traMADol (ULTRAM) 50 MG tablet Take 50 mg by mouth every 6 (six) hours  as needed. Pain             Review of Systems     Objective:   Physical Exam  Vitals reviewed. Constitutional: She is oriented to person, place, and time. She appears well-developed and well-nourished. No distress.  HENT:  Head: Normocephalic and atraumatic.  Mouth/Throat: No oropharyngeal exudate.  Eyes: Pupils are equal, round, and reactive to light.  Neck: Normal range of motion. Neck supple.  Cardiovascular: Normal rate, regular rhythm and normal heart sounds.   Pulmonary/Chest: Effort normal and breath sounds normal. No respiratory distress.  Abdominal: Soft. Bowel sounds are normal. She exhibits no distension. There is tenderness (MILD TO MOD TTP IN BLQs). There is no rebound and no guarding.  Musculoskeletal: She exhibits no edema.  Lymphadenopathy:    She has no cervical adenopathy.  Neurological: She is alert and oriented to person, place, and time.       NO FOCAL DEFICITS   Psychiatric: Her behavior is normal.          Assessment & Plan:

## 2011-11-22 NOTE — Patient Instructions (Signed)
SUBMIT STOOL STUDIES. COMPLETE 10 DAYS OF FLAGYL. USE PEPTO BISMOL AS NEEDED. RESTART YOUR PROBIOTICS. FOLLOW UP IN 3 WEEKS. CALL IF YOUR SYMPTOMS ARE NOT BETTER IN 7 DAYS. FOLLOW A DIARY FREE DIET FOR 7 DAYS.   Lactose Free Diet Lactose is a carbohydrate that is found mainly in milk and milk products, as well as in foods with added milk or whey. Lactose must be digested by the enzyme in order to be used by the body. Lactose intolerance occurs when there is a shortage of lactase. When your body is not able to digest lactose, you may feel sick to your stomach (nausea), bloating, cramping, gas and diarrhea.  There are many dairy products that may be tolerated better than milk by some people:  The use of cultured dairy products such as yogurt, buttermilk, cottage cheese, and sweet acidophilus milk (Kefir) for lactase-deficient individuals is usually well tolerated. This is because the healthy bacteria help digest lactose.   Lactose-hydrolyzed milk (Lactaid) contains 40-90% less lactose than milk and may also be well tolerated.    SPECIAL NOTES  Lactose is a carbohydrates. The major food source is dairy products. Reading food labels is important. Many products contain lactose even when they are not made from milk. Look for the following words: whey, milk solids, dry milk solids, nonfat dry milk powder. Typical sources of lactose other than dairy products include breads, candies, cold cuts, prepared and processed foods, and commercial sauces and gravies.   All foods must be prepared without milk, cream, or other dairy foods.   Soy milk and lactose-free supplements (LACTASE) may be used as an alternative to milk.   FOOD GROUP ALLOWED/RECOMMENDED AVOID/USE SPARINGLY  BREADS / STARCHES 4 servings or more* Breads and rolls made without milk. [place], [place], or [place] bread. Breads and rolls that contain milk. Prepared mixes such as muffins, biscuits, waffles, pancakes. Sweet rolls, donuts,  [place] toast (if made with milk or lactose).  Crackers: Soda crackers, graham crackers. Any crackers prepared without lactose. Zwieback crackers, corn curls, or any that contain lactose.  Cereals: Cooked or dry cereals prepared without lactose (read labels). Cooked or dry cereals prepared with lactose (read labels). Total, Cocoa Krispies. Special K.  Potatoes / Pasta / Rice: Any prepared without milk or lactose. Popcorn. Instant potatoes, frozen [place] fries, scalloped or au gratin potatoes.  VEGETABLES 2 servings or more Fresh, frozen, and canned vegetables. Creamed or breaded vegetables. Vegetables in a cheese sauce or with lactose-containing margarines.  FRUIT 2 servings or more All fresh, canned, or frozen fruits that are not processed with lactose. Any canned or frozen fruits processed with lactose.  MEAT & SUBSTITUTES 2 servings or more (4 to 6 oz. total per day) Plain beef, chicken, fish, [place], lamb, veal, pork, or ham. Kosher prepared meat products. Strained or junior meats that do not contain milk. Eggs, soy meat substitutes, nuts. Scrambled eggs, omelets, and souffles that contain milk. Creamed or breaded meat, fish, or fowl. Sausage products such as wieners, liver sausage, or cold cuts that contain milk solids. Cheese, cottage cheese, or cheese spreads.  MILK None. (See "BEVERAGES" for milk substitutes. See "DESSERTS" for ice cream and frozen desserts.) Milk (whole, 2%, skim, or chocolate). Evaporated, powdered, or condensed milk; malted milk.  SOUPS & COMBINATION FOODS Bouillon, broth, vegetable soups, clear soups, consomms. Homemade soups made with allowed ingredients. Combination or prepared foods that do not contain milk or milk products (read labels). Cream soups, chowders, commercially prepared soups containing lactose. Macaroni  and cheese, pizza. Combination or prepared foods that contain milk or milk products.  DESSERTS & SWEETS In moderation Water and fruit ices; gelatin;  angel food cake. Homemade cookies, pies, or cakes made from allowed ingredients. Pudding (if made with water or a milk substitute). Lactose-free tofu desserts. Sugar, honey, corn syrup, jam, jelly; marmalade; molasses (beet sugar); Pure sugar candy; marshmallows. Ice cream, ice milk, sherbet, custard, pudding, frozen yogurt. Commercial cake and cookie mixes. Desserts that contain chocolate. Pie crust made with milk-containing margarine; reduced-calorie desserts made with a sugar substitute that contains lactose. Toffee, peppermint, butterscotch, chocolate, caramels.  FATS & OILS In moderation Butter (as tolerated; contains very small amounts of lactose). Margarines and dressings that do not contain milk, Vegetable oils, shortening, Miracle Whip, mayonnaise, nondairy cream & whipped toppings without lactose or milk solids added (examples: Coffee Rich, Carnation Coffeemate, Rich's Whipped Topping, PolyRich). Tomasa Blase. Margarines and salad dressings containing milk; cream, cream cheese; peanut butter with added milk solids, sour cream, chip dips, made with sour cream.  BEVERAGES Carbonated drinks; tea; coffee and freeze-dried coffee; some instant coffees (check labels). Fruit drinks; fruit and vegetable juice; Rice or Soy milk. Ovaltine, hot chocolate. Some cocoas; some instant coffees; instant iced teas; powdered fruit drinks (read labels).   CONDIMENTS / MISCELLANEOUS Soy sauce, carob powder, olives, gravy made with water, baker's cocoa, pickles, pure seasonings and spices, wine, pure monosodium glutamate, catsup, mustard. Some chewing gums, chocolate, some cocoas. Certain antibiotics and vitamin / mineral preparations. Spice blends if they contain milk products. MSG extender. Artificial sweeteners that contain lactose such as Equal (Nutra-Sweet) and Sweet 'n Low. Some nondairy creamers (read labels).   SAMPLE MENU*  Breakfast   Orange Juice.  Banana.   Bran flakes.   Nondairy Creamer.  [place]  Bread (toasted).   Butter or milk-free margarine.   Coffee or tea.    Noon Meal   Chicken Breast.  Rice.   Green beans.   Butter or milk-free margarine.  Fresh melon.   Coffee or tea.    Evening Meal   Roast Beef.  Baked potato.   Butter or milk-free margarine.   Broccoli.   Lettuce salad with vinegar and oil dressing.  [employer].   Coffee or tea.

## 2011-11-22 NOTE — Progress Notes (Signed)
Faxed to PCP

## 2011-11-23 LAB — CLOSTRIDIUM DIFFICILE BY PCR: Toxigenic C. Difficile by PCR: DETECTED — CR

## 2011-11-26 NOTE — Progress Notes (Signed)
Pt is aware of OV on 3/7 at 10 with SF

## 2011-11-27 ENCOUNTER — Telehealth: Payer: Self-pay | Admitting: Gastroenterology

## 2011-11-27 ENCOUNTER — Telehealth: Payer: Self-pay

## 2011-11-27 MED ORDER — HYOSCYAMINE SULFATE ER 0.375 MG PO TB12: 0.3750 mg | ORAL_TABLET | Freq: Two times a day (BID) | ORAL | Status: DC | PRN

## 2011-11-27 MED ORDER — DICYCLOMINE HCL 10 MG PO CAPS
10.0000 mg | ORAL_CAPSULE | Freq: Three times a day (TID) | ORAL | Status: DC
Start: 2011-11-27 — End: 2012-01-05

## 2011-11-27 NOTE — Telephone Encounter (Signed)
Per Camille, Dr. Fields unavailable. Routing to Anna Sams, NP 

## 2011-11-27 NOTE — Telephone Encounter (Signed)
She has a hx of Cdiff, and I believe looking at the note was on Flagyl before seeing Dr. Darrick Penna. The Cdiff was somewhat expected to come back positive. However, it is good diarrhea is improving. Seems to be having more formed stool.   Needs to take the Flagyl as prescribed.  Sounds like she was talking about Levbid.  Will send rx to her pharmacy. If worsening of diarrhea, needs to let us know.

## 2011-11-27 NOTE — Telephone Encounter (Signed)
If diarrhea worsens or no improvement, may need to consider different therapy. From what I've gathered, it is improving.

## 2011-11-27 NOTE — Telephone Encounter (Signed)
Called pt. LM on VM for pt to call. ( Pt's C-Diff was positive and she is on Flagyl, please advise!

## 2011-11-27 NOTE — Telephone Encounter (Signed)
Addended by: Nira Retort on: 11/27/2011 03:26 PM   Modules accepted: Orders

## 2011-11-27 NOTE — Telephone Encounter (Signed)
Pt called; and said that she can not afford the Levbid, it costs $40.00. Per Tobi Bastos, she will send Rx of Bentyl to the pharmacy.

## 2011-11-27 NOTE — Telephone Encounter (Signed)
Patient is still having diarrhea/stomach cramps/nausea/shes been taking flagyl please advise??

## 2011-11-27 NOTE — Telephone Encounter (Signed)
Called and informed pt.  

## 2011-11-27 NOTE — Telephone Encounter (Signed)
Pt returned call. Said she is having some diarrhea, but more just bits and pieces of stool. She is taking Pepto x 3 daily. Most of her complaint is the cramping/spasms in her low abdomen. She also said she had a sublingual medication at one time that helped the cramps. She is aware Dr. Darrick Penna unavailable, and Gerrit Halls, NP will address her problems.

## 2011-11-28 ENCOUNTER — Telehealth: Payer: Self-pay

## 2011-11-28 NOTE — Telephone Encounter (Signed)
Called and informed pt. She is taking the Dexilant daily. She declined the diets/handouts and said she has those. She said she has felt better this afternoon. Said mornings seem to be worse. She has not had any diarrhea this afternoon. She will call tomorrow with an update.

## 2011-11-28 NOTE — Telephone Encounter (Signed)
Make sure she is taking her PPI daily. May take Zantac prn in evening if breakthrough indigestion. Follow a reflux diet. (may give handout if doesn't have).   Needs to follow low-fat, low-residue diet for now. I'm not sure she has this handout. If needed, can back off to bland/clears this afternoon and evening. May take Bentyl TID. May take kaopectate prn.   Overall, is diarrhea improving? Have her call back tomorrow with a progress report.

## 2011-11-28 NOTE — Telephone Encounter (Signed)
Pt called complaining of intermittent indigestion. Intermittent pain in her abdomen and sometimes breaks into a sweat. Said she has had diarrhea x 10 episodes this morning, but very little at a time. She got the Bentyl last evening. Took once last night and once this AM. She had Ravioli for supper last night and chicken noodle soup and bread this AM. Please advise!

## 2011-11-29 ENCOUNTER — Telehealth: Payer: Self-pay | Admitting: Internal Medicine

## 2011-11-29 NOTE — Telephone Encounter (Signed)
REVIEWED.  

## 2011-11-29 NOTE — Telephone Encounter (Signed)
This is a Dr. Darrick Penna pt. Routing to [employer]

## 2011-11-29 NOTE — Telephone Encounter (Signed)
PLEASE CALL PT. She should use Tylenol as needed for pain. Continue her probiotic daily & complete her course of Flagyl. She should not be concerned unless her T > 101F. It soundd like her C DIFF infection is improved.

## 2011-11-29 NOTE — Telephone Encounter (Signed)
LMOM for pt to call. 

## 2011-11-29 NOTE — Telephone Encounter (Signed)
Per Tobi Bastos, I am forwarding to Dr. Darrick Penna.

## 2011-11-29 NOTE — Telephone Encounter (Signed)
Patient is still taking her antibiotic but has just started running a low grade fever this morning cramping an hot sweats Please Advise??

## 2011-11-29 NOTE — Telephone Encounter (Signed)
Called and informed pt.  

## 2011-11-29 NOTE — Telephone Encounter (Signed)
Called and spoke with pt. She said she is running a low grade fever, right at 100  And she never runs a fever. She is having back pain and nausea. Has had 3 loose stools this AM but more formed now and not diarrhea. Please advise!

## 2011-12-13 ENCOUNTER — Ambulatory Visit (INDEPENDENT_AMBULATORY_CARE_PROVIDER_SITE_OTHER): Payer: PRIVATE HEALTH INSURANCE | Admitting: Gastroenterology

## 2011-12-13 ENCOUNTER — Encounter: Payer: Self-pay | Admitting: Gastroenterology

## 2011-12-13 DIAGNOSIS — R197 Diarrhea, unspecified: Secondary | ICD-10-CM

## 2011-12-13 MED ORDER — METRONIDAZOLE 500 MG PO TABS: ORAL_TABLET | ORAL | Status: DC

## 2011-12-13 NOTE — Patient Instructions (Addendum)
TAKE THE FLAGYL FOR 10 DAYS. SEE C DIFF PRECAUTIONS BELOW. USE PEPTO BISMOL OR KAOPECTATE AS NEEDED FOR DIARRHEA. ONCE THE FLAGYL COURSE IS COMPLETE, YOU MAY USE BENTYL AS NEEDED FOR ABDOMINAL CRAMPS OR DIARRHEA. FOLLOW UP IN 3 MOS.  C DIFF PRECAUTIONS  Hand-Washing Hand-washing is the No. 1 preventative measure in stopping the spread and infection of C. Diff. It is important to wash hands with warm water and anti-bacterial soap for at least 30 seconds. Alcohol-based hand sanitizers are not effective against C. Diff and will not kill spores that may be on the hands.  Isolation Any person who is infected with C. Diff should remain in a separate bedroom and ideally have a separate bathroom for at least 48 hours after they have stopped having diarrhea during treatment. When you are around a person with this infection, it is important to wear gloves and, if possible, a yellow isolation gown to prevent picking up the spores from their environment. Wash your hands immediately upon leaving their room to kill any spores that you may have accidentally picked up. Separate Hygiene Items The person infected with C. Diff should have their own hygiene items. Do not share washcloths, hand towels, toothbrushes, or linens with an infected person. Linens and clothes should be washed separately from anyone else's clothes with detergent and on the hot setting of the washer and dryer. Daily Cleaning of Affected Areas It is important to clean any areas that the infected person comes into contact with daily. Bathrooms should be cleaned after every visit. Mix 1 part of bleach with 10 parts of water as a cleaner and use on toilet bowls, sinks, sink and toilet handles and doorknobs. Vacuum carpets daily and mop any hard floors with the bleach solution used for bathroom cleansing to kill spores that are in the environment

## 2011-12-13 NOTE — Progress Notes (Signed)
Subjective:    Patient ID: [name], female    DOB: 1961-10-06, 51 y.o.   MRN: [id number]  PCP: [facility]  HPI Took Flagyl. Got better. Nl stools. Had bad bronchitis-needed zpak & prednisOne. Last dose: yesterday. Last night/Today: explosive diarrhea. Has nausea. No vomiting or fever.  Past Medical History  Diagnosis Date  . S/P colonoscopy 06/02/09    normal (Dr. Linna Darner)  . PUD (peptic ulcer disease) 01/2007    EGD Dr Jena Gauss, 2 antral ulcers, negative h pylori  . MRSA infection     Dr. Lenice Pressman, currently under treatment  . Hiatal hernia 2008    on EGD above  . Asthma   . PTSD (post-traumatic stress disorder)   . Migraines   . Degenerative disc disease   . Rectal prolapse   . SBO (small bowel obstruction)   . ADHD (attention deficit hyperactivity disorder)   . S/P endoscopy 03/26/11    gastritis-Dr Darrick Penna  . Clostridium difficile colitis 04/10/11    tx w/ flagyl  . Kidney stones   . Chronic abdominal pain 2003    EX LAP APR 2004 RUPTURE L OV CYST, JUL 2004 ADHESIONS  . Irritable bowel syndrome 2004 CONSTIPATION  . BMI (body mass index) 20.0-29.9 2009 127 LBS    Past Surgical History  Procedure Date  . Bowel resection     x2, secondary to adhesions  . Appendectomy   . Laparoscopic lysis intestinal adhesions   . Back surgery   . Abdominal hysterectomy   . Tubal ligation   . Umbilical hernia repair 2008    x5  . Neck surgery 2009  . Partial hysterectomy   . Upper gastrointestinal endoscopy APR 2008 [facility] BLEEDING/PAIN    PUD  . Upper gastrointestinal endoscopy JUL 2012 [facility] PAIN    MILD GASTRITIS    Allergies  Allergen Reactions  . Penicillins Anaphylaxis    REACTION: anaphylaxis  . Sulfa Antibiotics Itching  . Aspirin Other (See Comments)    Burning of stomach. REACTION: GI upset  . Ibuprofen Other (See Comments)    Upset stomach  . Morphine And Related Itching    Current Outpatient Prescriptions  Medication Sig Dispense Refill  . albuterol  (PROVENTIL,VENTOLIN) 90 MCG/ACT inhaler Inhale 2 puffs into the lungs every 6 (six) hours as needed. For shortness of breath      . ALPRAZolam (XANAX) 1 MG tablet Take 1 mg by mouth 4 (four) times daily as needed. Anxiety      . amphetamine-dextroamphetamine (ADDERALL XR) 15 MG 24 hr capsule Take 15 mg by mouth 2 (two) times daily.        . baclofen (LIORESAL) 10 MG tablet Take 10 mg by mouth as needed. For pain      . bismuth subsalicylate (PEPTO BISMOL) 262 MG/15ML suspension Take 15 mLs by mouth every 6 (six) hours as needed. Diarrhea      . dexlansoprazole (DEXILANT) 60 MG capsule Take 60 mg by mouth daily.       Marland Kitchen dicyclomine (BENTYL) 10 MG capsule Take 1 capsule (10 mg total) by mouth 3 (three) times daily.  90 capsule  1  . DULoxetine (CYMBALTA) 60 MG capsule Take 120 mg by mouth daily.       Marland Kitchen gabapentin (NEURONTIN) 300 MG capsule Take 600-900 mg by mouth 2 (two) times daily. Takes 600 mg every morning and 900 mg in the evening      . Lactobacillus (ACIDOPHILUS) CAPS Take 2 capsules by mouth 2 (two) times  daily with breakfast and lunch.       . lidocaine (LIDODERM) 5 % Place 1 patch onto the skin daily as needed. Uses for pain in back. Not used regularly.      . loperamide (IMODIUM) 2 MG capsule Take 2 mg by mouth 4 (four) times daily as needed. Diarrhea      .      Marland Kitchen Multiple Vitamin (MULITIVITAMIN WITH MINERALS) TABS Take 1 tablet by mouth daily.        Marland Kitchen topiramate (TOPAMAX) 25 MG tablet Take 25 mg by mouth 3 (three) times daily.       . traMADol (ULTRAM) 50 MG tablet Take 50 mg by mouth every 6 (six) hours as needed. Pain           Review of Systems     Objective:   Physical Exam  Vitals reviewed. Constitutional: She is oriented to person, place, and time. She appears well-developed and well-nourished. No distress.  HENT:  Head: Normocephalic and atraumatic.  Eyes: Pupils are equal, round, and reactive to light. No scleral icterus.  Neck: Normal range of motion. Neck supple.   Cardiovascular: Normal rate, regular rhythm and normal heart sounds.   Pulmonary/Chest: Effort normal and breath sounds normal. No respiratory distress.  Abdominal: Soft. Bowel sounds are normal. She exhibits no distension. There is no tenderness.  Musculoskeletal: Normal range of motion. She exhibits no edema.  Lymphadenopathy:    She has no cervical adenopathy.  Neurological: She is alert and oriented to person, place, and time.       NO FOCAL DEFICITS   Psychiatric: She has a normal mood and affect.          Assessment & Plan:

## 2011-12-14 ENCOUNTER — Telehealth: Payer: Self-pay | Admitting: Gastroenterology

## 2011-12-14 NOTE — Telephone Encounter (Signed)
Patient is asking for nausea medication shes feeling queezy she uses [facility] please advise??

## 2011-12-14 NOTE — Assessment & Plan Note (Signed)
LIKELY DUE TO RECURRENT C DIFF IN THE SETTING OF RECENT STEROID & ABX USE.  FLAGYL 500 MG TID FOR 10 DAYS. PT INSTRUCTED TO CALL ME WHENEVER SHE STARTS ABX. SHE NEEDS TO START FLAGYL TO PREVENT C DIFF COLITIS & CONTINUE FOR 10 DAYS AFTER HER OTHER ABX ARE STOPPED. OPV IN 3 MOS.

## 2011-12-14 NOTE — Progress Notes (Signed)
Faxed to PCP

## 2011-12-14 NOTE — Telephone Encounter (Signed)
CALL IN ZOFRAN 4 MG 1-2 PO Q4-6H PRN FOR NAUSEA/VOMITING, #20, [id number].

## 2011-12-14 NOTE — Telephone Encounter (Signed)
Rx called to [place] at [facility]. Pt aware.

## 2011-12-18 ENCOUNTER — Telehealth: Payer: Self-pay

## 2011-12-18 NOTE — Telephone Encounter (Signed)
Pt came by the office. She has been to the dentist. Has a prescription for E-mycin 250mg , #15 and to take one po tid. She is afraid to take because she is still taking her Flagyl and is not completely well yet. She is still having some nausea. She has infection in her jawbone. Has some teeth that needs to be pulled, but they need to get rid of the infection first. (said she is allergic to Penicillin and cannot take Biaxin.  Please advise!

## 2011-12-18 NOTE — Telephone Encounter (Signed)
Phone call from Dr. Darrick Penna. Said to tell pt to take the prescription from the dentist for the E-mycin. Also, she will need Flagyl 500mg  tid x 15 days QS and no refills. Pt aware and Rx called to [place] at [facility].

## 2011-12-19 ENCOUNTER — Telehealth: Payer: Self-pay | Admitting: Gastroenterology

## 2011-12-19 NOTE — Telephone Encounter (Signed)
REVIEWED. AGREE. 

## 2011-12-19 NOTE — Telephone Encounter (Signed)
Phenergan Rx called to [place] at [facility]. Flagyl was called in yesterday ( see telephone note of 12/18/2011)

## 2011-12-19 NOTE — Telephone Encounter (Signed)
Pt called to ask if we could change her nausea medicine back to Phenergan. What she is taking is not helping. She complains of a lot of nausea and also said that the pharmacy [facility]) has not received anything from [place] regarding her Flagyl. Please advise and call patient at [phone]

## 2011-12-19 NOTE — Telephone Encounter (Signed)
Call in Flagyl to [facility] & Phenergan 12.5 mg 1 po q6h prn nausea & vomiting #30 rfx0.

## 2011-12-20 ENCOUNTER — Telehealth: Payer: Self-pay | Admitting: Gastroenterology

## 2011-12-20 NOTE — Progress Notes (Signed)
Reminder in epic to follow up in 3 months °

## 2011-12-20 NOTE — Telephone Encounter (Signed)
Called pt. Not accepting calls at this time.

## 2011-12-20 NOTE — Telephone Encounter (Signed)
Pt called to speak with [facility] nurse about her medicines. Please call her back at [phone]

## 2011-12-21 NOTE — Telephone Encounter (Signed)
Increase fluids. Call PCP w/ urinary symptoms. I do not think this is necessarily related to dicyclomine.

## 2011-12-21 NOTE — Telephone Encounter (Signed)
Called and informed pt.  

## 2011-12-21 NOTE — Telephone Encounter (Signed)
Pt said when she gets up some mornings after going about 6 hours without urinating, her urine is very dark brown. It is only with the first void. She is not having any pain with urination, urgency or any more frequency than usual. She thinks she has linked it to dicyclomine. She doesn't take it often, but when she noticed the dark urine is after she had taken that pill. Please advise!

## 2011-12-31 ENCOUNTER — Telehealth: Payer: Self-pay | Admitting: Gastroenterology

## 2011-12-31 NOTE — Telephone Encounter (Signed)
PLEASE LET PT KNOW FLAGYL CAN CAUSE NAUSEA. TAKE IT WITH FOOD OR MILK. CALL IN PHENERGAN 25 MG TABS 1/2-1 PO Q4-6H PRN FOR NAUSEA OR VOMITING, #25, RFXO. IF NAUSEA CONTINUES SHE SHOULD MAKE SURE HER UTI IS GONE. OPV IN 2-3 WEEKS, W/ SF OR EXTENDER RE: NAUSEA, C DIFF, E 30 VISIT

## 2011-12-31 NOTE — Telephone Encounter (Signed)
Pt informed. Rx called to [place] at [facility].

## 2011-12-31 NOTE — Telephone Encounter (Signed)
Patient is asking for a refill on her Phenergan still having nausea called to [facility] please advise??

## 2012-01-01 ENCOUNTER — Encounter: Payer: Self-pay | Admitting: Gastroenterology

## 2012-01-01 NOTE — Telephone Encounter (Signed)
Pt is aware of OV on 4/16 at 1030 with KJ and appt card was mailed

## 2012-01-05 ENCOUNTER — Encounter (HOSPITAL_COMMUNITY): Payer: Self-pay | Admitting: *Deleted

## 2012-01-05 ENCOUNTER — Emergency Department (HOSPITAL_COMMUNITY): Payer: PRIVATE HEALTH INSURANCE

## 2012-01-05 ENCOUNTER — Emergency Department (HOSPITAL_COMMUNITY)
Admission: EM | Admit: 2012-01-05 | Discharge: 2012-01-05 | Disposition: A | Discharge status: Home / Self Care | Payer: PRIVATE HEALTH INSURANCE | Attending: Emergency Medicine | Admitting: Emergency Medicine

## 2012-01-05 DIAGNOSIS — M545 Low back pain, unspecified: Secondary | ICD-10-CM | POA: Insufficient documentation

## 2012-01-05 DIAGNOSIS — Z79899 Other long term (current) drug therapy: Secondary | ICD-10-CM | POA: Insufficient documentation

## 2012-01-05 DIAGNOSIS — K589 Irritable bowel syndrome without diarrhea: Secondary | ICD-10-CM | POA: Insufficient documentation

## 2012-01-05 DIAGNOSIS — G8929 Other chronic pain: Secondary | ICD-10-CM | POA: Insufficient documentation

## 2012-01-05 DIAGNOSIS — N390 Urinary tract infection, site not specified: Secondary | ICD-10-CM | POA: Insufficient documentation

## 2012-01-05 DIAGNOSIS — F17200 Nicotine dependence, unspecified, uncomplicated: Secondary | ICD-10-CM | POA: Insufficient documentation

## 2012-01-05 DIAGNOSIS — J45909 Unspecified asthma, uncomplicated: Secondary | ICD-10-CM | POA: Insufficient documentation

## 2012-01-05 LAB — BASIC METABOLIC PANEL
BUN: 14 mg/dL (ref 6–23)
Calcium: 10.2 mg/dL (ref 8.4–10.5)
Chloride: 105 mEq/L (ref 96–112)
Creatinine, Ser: 0.67 mg/dL (ref 0.50–1.10)
GFR calc Af Amer: >90 mL/min (ref >90)
GFR calc non Af Amer: >90 mL/min (ref >90)

## 2012-01-05 LAB — URINALYSIS, ROUTINE W REFLEX MICROSCOPIC
Hgb urine dipstick: NEGATIVE
Nitrite: POSITIVE — AB
Protein, ur: 100 mg/dL — AB
Specific Gravity, Urine: 1.01 (ref 1.005–1.030)
Urobilinogen, UA: >8 mg/dL — ABNORMAL HIGH (ref 0.0–1.0)

## 2012-01-05 LAB — CBC
HCT: 42 % (ref 36.0–46.0)
MCHC: 32.9 g/dL (ref 30.0–36.0)
Platelets: 319 10*3/uL (ref 150–400)
RDW: 13.8 % (ref 11.5–15.5)
WBC: 8.9 10*3/uL (ref 4.0–10.5)

## 2012-01-05 LAB — URINE MICROSCOPIC-ADD ON

## 2012-01-05 LAB — DIFFERENTIAL
Basophils Absolute: 0.1 10*3/uL (ref 0.0–0.1)
Basophils Relative: 1 % (ref 0–1)
Lymphocytes Relative: 27 % (ref 12–46)
Monocytes Absolute: 0.7 10*3/uL (ref 0.1–1.0)
Neutro Abs: 5.6 10*3/uL (ref 1.7–7.7)

## 2012-01-05 MED ORDER — HYDROCODONE-ACETAMINOPHEN 5-325 MG PO TABS: 1.0000 | ORAL_TABLET | ORAL | Status: AC | PRN

## 2012-01-05 MED ORDER — ONDANSETRON HCL 4 MG/2ML IJ SOLN
4.0000 mg | Freq: Once | INTRAMUSCULAR | Status: AC
  Administered 2012-01-05: 4 mg via INTRAVENOUS
  Filled 2012-01-05: qty 2

## 2012-01-05 MED ORDER — NITROFURANTOIN MONOHYD MACRO 100 MG PO CAPS
100.0000 mg | ORAL_CAPSULE | Freq: Once | ORAL | Status: AC
  Administered 2012-01-05: 100 mg via ORAL
  Filled 2012-01-05: qty 1

## 2012-01-05 MED ORDER — HYDROCODONE-ACETAMINOPHEN 5-325 MG PO TABS
2.0000 | ORAL_TABLET | Freq: Once | ORAL | Status: AC
  Administered 2012-01-05: 2 via ORAL
  Filled 2012-01-05: qty 2

## 2012-01-05 MED ORDER — NITROFURANTOIN MONOHYD MACRO 100 MG PO CAPS: 100.0000 mg | ORAL_CAPSULE | Freq: Two times a day (BID) | ORAL | Status: AC

## 2012-01-05 MED ORDER — HYDROMORPHONE HCL PF 1 MG/ML IJ SOLN
1.0000 mg | Freq: Once | INTRAMUSCULAR | Status: AC
Start: 2012-01-05 — End: 2012-01-05
  Administered 2012-01-05: 1 mg via INTRAVENOUS
  Filled 2012-01-05: qty 1

## 2012-01-05 NOTE — ED Notes (Addendum)
Pt c/o back pain, painful urination, and urinary frequency and pain in pubic area. Pt states that she was on Flagyl previously for intestinal infection. Pt states that she has had a low grade fever this am. Also c/o right flank pain and nausea.

## 2012-01-05 NOTE — ED Notes (Signed)
Attempted 2 IV sites . Unsuccessful. Gennette Pac RN attempting

## 2012-01-05 NOTE — Discharge Instructions (Signed)
Urinary Tract Infection Infections of the urinary tract can start in several places. A bladder infection (cystitis), a kidney infection (pyelonephritis), and a prostate infection (prostatitis) are different types of urinary tract infections (UTIs). They usually get better if treated with medicines (antibiotics) that kill germs. Take all the medicine until it is gone. You or your child may feel better in a few days, but TAKE ALL MEDICINE or the infection may not respond and may become more difficult to treat. HOME CARE INSTRUCTIONS   Drink enough water and fluids to keep the urine clear or pale yellow. Cranberry juice is especially recommended, in addition to large amounts of water.   Avoid caffeine, tea, and carbonated beverages. They tend to irritate the bladder.   Alcohol may irritate the prostate.   Only take over-the-counter or prescription medicines for pain, discomfort, or fever as directed by your caregiver.  To prevent further infections:  Empty the bladder often. Avoid holding urine for long periods of time.   After a bowel movement, women should cleanse from front to back. Use each tissue only once.   Empty the bladder before and after sexual intercourse.  FINDING OUT THE RESULTS OF YOUR TEST Not all test results are available during your visit. If your or your child's test results are not back during the visit, make an appointment with your caregiver to find out the results. Do not assume everything is normal if you have not heard from your caregiver or the medical facility. It is important for you to follow up on all test results. SEEK MEDICAL CARE IF:   There is back pain.   Your baby is older than 3 months with a rectal temperature of 100.5 F (38.1 C) or higher for more than 1 day.   Your or your child's problems (symptoms) are no better in 3 days. Return sooner if you or your child is getting worse.  SEEK IMMEDIATE MEDICAL CARE IF:   There is severe back pain or lower  abdominal pain.   You or your child develops chills.   You have a fever.   Your baby is older than 3 months with a rectal temperature of 102 F (38.9 C) or higher.   Your baby is 64 months old or younger with a rectal temperature of 100.4 F (38 C) or higher.   There is nausea or vomiting.   There is continued burning or discomfort with urination.  MAKE SURE YOU:   Understand these instructions.   Will watch your condition.   Will get help right away if you are not doing well or get worse.  Document Released: 07/04/2005 Document Revised: 09/13/2011 Document Reviewed: 02/06/2007 [facility] Patient Information 2012 [place], [place].   Use the medicine prescribed for your urinary symptoms.  I also encourage you to eat one or 2 servings of yogurt with active disease cultures daily to help prevent diarrhea while taking this antibiotic.  Please call your primary doctor for further management if you diarrhea returns, or return here for further evaluation.  Drink plenty of fluids, he may continue using your age so for symptom relief.

## 2012-01-05 NOTE — ED Notes (Signed)
Pt Dc to home with a family friend.  Pt with steady gait

## 2012-01-06 LAB — URINE CULTURE: Culture  Setup Time: 201303301934

## 2012-01-06 NOTE — ED Provider Notes (Signed)
History     CSN: [id number]  Arrival date & time 01/05/12  1118   First MD Initiated Contact with Patient 01/05/12 1148      Chief Complaint  Patient presents with  . Back Pain    (Consider location/radiation/quality/duration/timing/severity/associated sxs/prior treatment) HPI Comments: Patient presents for evaluation of low back pain which she states is worse with movement and palpation.  She has a history of chronic intermittent low back pain, but also reports having pain with urination and increased urinary frequency.  She does have a history of kidney stones but has been several years since she has had a stone.  She she reports a low-grade fever this morning but has taken no antipyretics prior to arrival.  She also has nausea without emesis.  Patient is a 51 y.o. female presenting with back pain. The history is provided by the patient.  Back Pain  This is a recurrent problem. The problem occurs constantly. The problem has not changed since onset.Pain location: She describes aching across her lower back.  She denies flank pain. The quality of the pain is described as aching. The pain is at a severity of 8/10. The pain is moderate. Exacerbated by: Pain is constant and not worsened with positional changes. The pain is the same all the time. Associated symptoms include a fever and dysuria. Pertinent negatives include no chest pain, no numbness, no headaches, no abdominal pain, no abdominal swelling, no bowel incontinence, no perianal numbness, no bladder incontinence, no pelvic pain, no leg pain, no paresthesias, no paresis, no tingling and no weakness. She has tried nothing for the symptoms.    Past Medical History  Diagnosis Date  . S/P colonoscopy 06/02/09    normal (Dr. Linna Darner)  . PUD (peptic ulcer disease) 01/2007    EGD Dr Jena Gauss, 2 antral ulcers, negative h pylori  . MRSA infection     Dr. Lenice Pressman, currently under treatment  . Hiatal hernia 2008    on EGD above  . Asthma    . PTSD (post-traumatic stress disorder)   . Migraines   . Degenerative disc disease   . Rectal prolapse   . SBO (small bowel obstruction)   . ADHD (attention deficit hyperactivity disorder)   . S/P endoscopy 03/26/11    gastritis-Dr Darrick Penna  . Clostridium difficile colitis 04/10/11    tx w/ flagyl  . Kidney stones   . Chronic abdominal pain 2003    EX LAP APR 2004 RUPTURE L OV CYST, JUL 2004 ADHESIONS  . Irritable bowel syndrome 2004 CONSTIPATION  . BMI (body mass index) 20.0-29.9 2009 127 LBS    Past Surgical History  Procedure Date  . Bowel resection     x2, secondary to adhesions  . Appendectomy   . Laparoscopic lysis intestinal adhesions   . Back surgery   . Abdominal hysterectomy   . Tubal ligation   . Umbilical hernia repair 2008    x5  . Neck surgery 2009  . Partial hysterectomy   . Upper gastrointestinal endoscopy APR 2008 [facility] BLEEDING/PAIN    PUD  . Upper gastrointestinal endoscopy JUL 2012 [facility] PAIN    MILD GASTRITIS    Family History  Problem Relation Age of Onset  . Adopted: Yes    History  Substance Use Topics  . Smoking status: Current Everyday Smoker -- 0.5 packs/day for 1 years    Types: Cigarettes  . Smokeless tobacco: Never Used  . Alcohol Use: No    OB History  Grav Para Term Preterm Abortions TAB SAB Ect Mult Living   1 1 1       1       Review of Systems  Constitutional: Positive for fever.  HENT: Negative for congestion, sore throat and neck pain.   Eyes: Negative.   Respiratory: Negative for chest tightness and shortness of breath.   Cardiovascular: Negative for chest pain.  Gastrointestinal: Negative for nausea, abdominal pain and bowel incontinence.  Genitourinary: Positive for dysuria. Negative for bladder incontinence and pelvic pain.  Musculoskeletal: Positive for back pain. Negative for joint swelling and arthralgias.  Skin: Negative.  Negative for rash and wound.  Neurological: Negative for dizziness, tingling, weakness,  light-headedness, numbness, headaches and paresthesias.  Hematological: Negative.   Psychiatric/Behavioral: Negative.     Allergies  Penicillins; Sulfa antibiotics; Aspirin; Ibuprofen; and Morphine and related  Home Medications   Current Outpatient Rx  Name Route Sig Dispense Refill  . ALBUTEROL 90 MCG/ACT IN AERS Inhalation Inhale 2 puffs into the lungs every 6 (six) hours as needed. For shortness of breath    . ALPRAZOLAM 1 MG PO TABS Oral Take 1 mg by mouth 4 (four) times daily as needed. Anxiety    . AMPHETAMINE-DEXTROAMPHET ER 15 MG PO CP24 Oral Take 15 mg by mouth 2 (two) times daily.      Marland Kitchen BACLOFEN 10 MG PO TABS Oral Take 10 mg by mouth as needed. For pain    . BISMUTH SUBSALICYLATE 262 MG/15ML PO SUSP Oral Take 15 mLs by mouth every 6 (six) hours as needed. Diarrhea    . DEXLANSOPRAZOLE 60 MG PO CPDR Oral Take 60 mg by mouth daily.     . DULOXETINE HCL 60 MG PO CPEP Oral Take 120 mg by mouth daily.     Marland Kitchen GABAPENTIN 300 MG PO CAPS Oral Take 600-900 mg by mouth 2 (two) times daily. Takes 600 mg every morning and 900 mg in the evening    . ACIDOPHILUS PO CAPS Oral Take 2 capsules by mouth daily.     Marland Kitchen LIDOCAINE 5 % EX PTCH Transdermal Place 1 patch onto the skin daily as needed. Uses for pain in back. Not used regularly.    Marland Kitchen LOPERAMIDE HCL 2 MG PO CAPS Oral Take 2 mg by mouth 4 (four) times daily as needed. Diarrhea    . ADULT MULTIVITAMIN W/MINERALS CH Oral Take 1 tablet by mouth daily.      . TOPIRAMATE 25 MG PO TABS Oral Take 25 mg by mouth 3 (three) times daily.     . TRAMADOL HCL 50 MG PO TABS Oral Take 50 mg by mouth every 6 (six) hours as needed. Pain    . ZOLPIDEM TARTRATE 5 MG PO TABS Oral Take 5 mg by mouth at bedtime as needed. For sleep    . HYDROCODONE-ACETAMINOPHEN 5-325 MG PO TABS Oral Take 1 tablet by mouth every 4 (four) hours as needed for pain. 15 tablet 0  . NITROFURANTOIN MONOHYD MACRO 100 MG PO CAPS Oral Take 1 capsule (100 mg total) by mouth 2 (two) times  daily. 14 capsule 0  . PROMETHAZINE HCL 12.5 MG PO TABS  1-2 po q4-6 h prn nausea or vomiting 20 tablet 1  . PROMETHAZINE HCL 25 MG PO TABS Oral Take 1 tablet by mouth Once daily as needed. For nausea       BP 101/59  Pulse 82  Temp(Src) 98.6 F (37 C) (Oral)  Resp 18  Ht 5\' 5"  (1.651 m)  Wt 155 lb (70.308 kg)  BMI 25.79 kg/m2  SpO2 94%  Physical Exam  Nursing note and vitals reviewed. Constitutional: She is oriented to person, place, and time. She appears well-developed and well-nourished.  HENT:  Head: Normocephalic and atraumatic.  Eyes: Conjunctivae are normal.  Neck: Normal range of motion.  Cardiovascular: Normal rate, regular rhythm, normal heart sounds and intact distal pulses.   Pulmonary/Chest: Effort normal and breath sounds normal. She has no wheezes.  Abdominal: Soft. Bowel sounds are normal. She exhibits no distension. There is no tenderness. There is no rebound and no guarding.  Musculoskeletal: Normal range of motion.       Lumbar back: She exhibits pain. She exhibits no swelling and no edema.       Back:  Neurological: She is alert and oriented to person, place, and time.  Skin: Skin is warm and dry.  Psychiatric: She has a normal mood and affect.    ED Course  Procedures (including critical care time)  Labs Reviewed  URINALYSIS, ROUTINE W REFLEX MICROSCOPIC - Abnormal; Notable for the following:    Color, Urine ORANGE (*) BIOCHEMICALS MAY BE AFFECTED BY COLOR   Glucose, UA 100 (*)    Bilirubin Urine MODERATE (*)    Ketones, ur TRACE (*)    Protein, ur 100 (*)    Urobilinogen, UA >8.0 (*)    Nitrite POSITIVE (*)    Leukocytes, UA TRACE (*)    All other components within normal limits  BASIC METABOLIC PANEL - Abnormal; Notable for the following:    Glucose, Bld 106 (*)    All other components within normal limits  URINE MICROSCOPIC-ADD ON  CBC  DIFFERENTIAL  URINE CULTURE   Ct Abdomen Pelvis Wo Contrast  01/05/2012  *RADIOLOGY REPORT*   Clinical Data: Back pain.  Painful urination.  Urinary frequency.  CT ABDOMEN AND PELVIS WITHOUT CONTRAST  Technique:  Multidetector CT imaging of the abdomen and pelvis was performed following the standard protocol without intravenous contrast.  Comparison: 07/15/2011.  Findings: Right middle lobe and lingular scarring is present.  Bleb is present in the lingula.  The appearance of the lungs is unchanged compared to 07/15/2011.  Sub 4 mm nodular density in the lingula (image 10 series 3) probably represents a small focus of nodular scarring.  Unenhanced CT was performed per clinician order.  Lack of IV contrast limits sensitivity and specificity, especially for evaluation of abdominal/pelvic solid viscera.  Small fat containing right posterior medial diaphragmatic hernia is unchanged compared to prior.  The kidneys demonstrate no stones or obstruction. Grossly the appearance of the liver, gallbladder, pancreas, spleen, adrenal glands, stomach and duodenum is within normal limits allowing for noncontrast technique.  Ventral hernia repair is present.  No recurrent hernia.  Postoperative changes of L4-S1 posterior lumbar interbody fusion with decompression at L4-L5 and L5-S1.  Hysterectomy.  Staples are present along the colon compatible with partial colectomy.  Prominent stool burden is present.  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  There are no aggressive osseous lesions.  The appendix is not identified, likely normal or absent.  IMPRESSION: 1.  No acute abnormality. 2.  Postoperative changes of ventral hernia repair and lower lumbar spinal surgery.  Presumed partial colectomy involving the sigmoid.  Original Report Authenticated By: Andreas Newport, M.D.     1. UTI (lower urinary tract infection)     Patient given Dilaudid  and Zofran with moderate relief of pain symptoms.  Also given first  dose of Macrobid prior to discharge home.  MDM  Lab results and CT  scan reviewed prior to discharge home.  No acute kidney stone today, with urinary symptoms and nitrites reflected in her urine will treat for UTI.  Patient states has recently completed a course of Flagyl do to a C. difficile which she obtained from multiple doses of clindamycin over the past several months as she was being treated for resistant sinusitis. Encouraged to eat several servings of yogurt daily to help prevent return of her diarrhea from her recent diagnosis of C. difficile.  She does state she has some of her Flagyl tablets left.  Advised that she needs to contact her doctor if she develops return of diarrhea.  She may need to continue her Flagyl while she is on this medication.  Urine culture was also sent.       Candis Musa, PA 01/06/12 [phone]

## 2012-01-07 ENCOUNTER — Telehealth: Payer: Self-pay | Admitting: Gastroenterology

## 2012-01-07 NOTE — Telephone Encounter (Signed)
Rx called to [place] at [facility]. Pt aware.

## 2012-01-07 NOTE — Telephone Encounter (Signed)
Pt called this morning to speak with SF's nurse regarding her medications. Please call her back at [phone]

## 2012-01-07 NOTE — Telephone Encounter (Signed)
Pt now says she only has #16 tablets of the Flagyl. Needs new Rx to pharmacy.

## 2012-01-07 NOTE — Telephone Encounter (Signed)
CALL IN #[phone] [id number].

## 2012-01-07 NOTE — Telephone Encounter (Signed)
Please call pt. She should take FLAGYL TID FOR 17 DAYS. SHE SHOULD CONTINUE HER PROBIOTIC.

## 2012-01-07 NOTE — Telephone Encounter (Signed)
I called pt. She went to ED over the week-end. She has a UTI. On Macrobid 100 mg bid x 7 days. She wanted to let Dr. Darrick Penna know. She said she still has plenty flagyl at this time. Said she still has not had her tooth pulled yet. She will be doing that when she gets straightened out.

## 2012-01-08 NOTE — ED Provider Notes (Signed)
Medical screening examination/treatment/procedure(s) were performed by non-physician practitioner and as supervising physician I was immediately available for consultation/collaboration.  Aarya Quebedeaux, MD 01/08/12 0806 

## 2012-01-15 ENCOUNTER — Telehealth: Payer: Self-pay

## 2012-01-15 NOTE — Telephone Encounter (Signed)
Pt said she is having some fecal incontinence and some abdominal pain at times. Ov with KJ on 01/17/2012 @ 10:00 AM.

## 2012-01-16 ENCOUNTER — Other Ambulatory Visit: Payer: Self-pay | Admitting: Gastroenterology

## 2012-01-17 ENCOUNTER — Ambulatory Visit: Payer: PRIVATE HEALTH INSURANCE | Admitting: Urgent Care

## 2012-01-21 ENCOUNTER — Encounter: Payer: Self-pay | Admitting: Gastroenterology

## 2012-01-22 ENCOUNTER — Encounter: Payer: Self-pay | Admitting: Urgent Care

## 2012-01-22 ENCOUNTER — Ambulatory Visit (INDEPENDENT_AMBULATORY_CARE_PROVIDER_SITE_OTHER): Payer: PRIVATE HEALTH INSURANCE | Admitting: Urgent Care

## 2012-01-22 VITALS — BP 97/64 | HR 82 | Temp 98.0°F | Ht 65.0 in | Wt 157.6 lb

## 2012-01-22 DIAGNOSIS — R110 Nausea: Secondary | ICD-10-CM

## 2012-01-22 DIAGNOSIS — K219 Gastro-esophageal reflux disease without esophagitis: Secondary | ICD-10-CM

## 2012-01-22 DIAGNOSIS — A0472 Enterocolitis due to Clostridium difficile, not specified as recurrent: Secondary | ICD-10-CM

## 2012-01-22 DIAGNOSIS — R1011 Right upper quadrant pain: Secondary | ICD-10-CM | POA: Insufficient documentation

## 2012-01-22 LAB — HEPATIC FUNCTION PANEL
AST: 20 U/L (ref 0–37)
Albumin: 4.4 g/dL (ref 3.5–5.2)
Alkaline Phosphatase: 73 U/L (ref 39–117)
Total Bilirubin: 0.2 mg/dL — ABNORMAL LOW (ref 0.3–1.2)

## 2012-01-22 NOTE — Patient Instructions (Signed)
Get your labs today Continue Dexilant 60 mg daily Complete Flagyl as directed Continue align daily Avoid baclofen and aspirin He may use Phenergan as needed for nausea We will call you with results of your HIDA scan

## 2012-01-22 NOTE — Assessment & Plan Note (Addendum)
RUQ/epigastric pain of unknown etiology. Associated w/ nausea.  Worse with meals. Non-contrast CT abdomen and pelvis without etiology of her pain.  Previous abdominal ultrasound 2011 benign. HIDA scan to look for biliary dyskinesia.    LFTs, lipase Avoid aspirin products, NSAIDs including baclofen

## 2012-01-22 NOTE — Progress Notes (Signed)
Faxed to PCP

## 2012-01-22 NOTE — Progress Notes (Signed)
Primary Care Physician:  Elby Showers, MD, MD Primary Gastroenterologist:  Dr. Darrick Penna  Chief Complaint  Patient presents with  . Abdominal Pain  . Nausea    HPI:  [name] is a 51 y.o. female here for right upper quadrant/epigastric pain and nausea.  She has history of c diff colitis & has had multiple rounds of antibiotics recently.  Last antibiotics for dental AB & UTI approximately 2weeks ago.  Has been on continuous flagyl for c diff under the direction of Dr. Darrick Penna.  C/o "Violent" diarrhea 2 days ago, but it does not last more than a day. Her stools alternate between constipation, formed stools and diarrhea.  She is taking probiotics daily.   C/o ruq cramps & upper abdominal fullness for several months.  C/o nausea all the time. She takes phenergan when necessary which seems to help.  She feels the nausea may be due to flagyl.  Denies vomiting.  Pain 10/10 gripping.  Lasts minutes.  Worse w/ eating.  HX PUD.  Taking dexilant 60mg  daily.  Pain feels different than "ulcer pain".  C/o heartburn & indigestion.  Took Pepcid as well.    12/19/11 Noncontrast CT scan of abdomen and pelvis while in the emergency department showed no acute abnormality and postoperative changes of ventral hernia repair and lower lumbar spinal surgery. Presumed partial colectomy involving the sigmoid.  Past Medical History  Diagnosis Date  . S/P colonoscopy 06/02/09    normal (Dr. Linna Darner)  . PUD (peptic ulcer disease) 01/2007    EGD Dr Jena Gauss, 2 antral ulcers, negative h pylori  . MRSA infection     Dr. Lenice Pressman, currently under treatment  . Hiatal hernia 2008    on EGD above  . Asthma   . PTSD (post-traumatic stress disorder)   . Migraines   . Degenerative disc disease   . Rectal prolapse   . SBO (small bowel obstruction)   . ADHD (attention deficit hyperactivity disorder)   . S/P endoscopy 03/26/11    gastritis-Dr Darrick Penna  . Clostridium difficile colitis 04/10/11 &11/2011    tx w/ flagyl  .  Kidney stones   . Chronic abdominal pain 2003    EX LAP APR 2004 RUPTURE L OV CYST, JUL 2004 ADHESIONS  . Irritable bowel syndrome 2004 CONSTIPATION  . BMI (body mass index) 20.0-29.9 2009 127 LBS    Past Surgical History  Procedure Date  . Bowel resection     x2, secondary to adhesions  . Appendectomy   . Laparoscopic lysis intestinal adhesions   . Back surgery   . Abdominal hysterectomy   . Tubal ligation   . Umbilical hernia repair 2008    x5  . Neck surgery 2009  . Partial hysterectomy   . Upper gastrointestinal endoscopy APR 2008 [facility] BLEEDING/PAIN    PUD  . Upper gastrointestinal endoscopy JUL 2012 [facility] PAIN    MILD GASTRITIS  . Esophagogastroduodenoscopy      Normal esophagus without evidence of Barrett's, mass, erosions, or ulcerations./ Patchy erythema with occasional erosion in the antrum.  Biopsies  obtained via cold forceps to evaluate for H. pylori gastritis/ Small hiatal hernia./Normal duodenal bulb and second portion of the duodenum.    Current Outpatient Prescriptions  Medication Sig Dispense Refill  . albuterol (PROVENTIL,VENTOLIN) 90 MCG/ACT inhaler Inhale 2 puffs into the lungs every 6 (six) hours as needed. For shortness of breath      . ALPRAZolam (XANAX) 1 MG tablet Take 1 mg by mouth 4 (four) times daily  as needed. Anxiety      . amphetamine-dextroamphetamine (ADDERALL XR) 15 MG 24 hr capsule Take 15 mg by mouth 2 (two) times daily.        . baclofen (LIORESAL) 10 MG tablet Take 10 mg by mouth as needed. For pain      . bismuth subsalicylate (PEPTO BISMOL) 262 MG/15ML suspension Take 15 mLs by mouth every 6 (six) hours as needed. Diarrhea      . dexlansoprazole (DEXILANT) 60 MG capsule Take 60 mg by mouth daily.       . DULoxetine (CYMBALTA) 60 MG capsule Take 120 mg by mouth daily.       Marland Kitchen gabapentin (NEURONTIN) 300 MG capsule Take 600-900 mg by mouth 2 (two) times daily. Takes 600 mg every morning and 900 mg in the evening      . Lactobacillus  (ACIDOPHILUS) CAPS Take 2 capsules by mouth daily.       Marland Kitchen lidocaine (LIDODERM) 5 % Place 1 patch onto the skin daily as needed. Uses for pain in back. Not used regularly.      . loperamide (IMODIUM) 2 MG capsule Take 2 mg by mouth 4 (four) times daily as needed. Diarrhea      . LYRICA 50 MG capsule Take 50 mg by mouth 2 (two) times daily.       . metroNIDAZOLE (FLAGYL) 500 MG tablet Take 500 mg by mouth 3 (three) times daily.      . montelukast (SINGULAIR) 10 MG tablet Take 10 mg by mouth at bedtime.       . Multiple Vitamin (MULITIVITAMIN WITH MINERALS) TABS Take 1 tablet by mouth daily.        Marland Kitchen PHENERGAN 25 MG tablet TAKE 1/2 TO 1 TABLET BY MOUTH EVERY 4 TO 6 HOURS AS NEEDED FOR NAUSEAAND VOMITING.  25 each  0  . topiramate (TOPAMAX) 25 MG tablet Take 25 mg by mouth 3 (three) times daily.       . traMADol (ULTRAM) 50 MG tablet Take 50 mg by mouth every 6 (six) hours as needed. Pain      . zolpidem (AMBIEN) 5 MG tablet Take 5 mg by mouth at bedtime as needed. For sleep      . promethazine (PHENERGAN) 12.5 MG tablet 1-2 po q4-6 h prn nausea or vomiting  20 tablet  1    Allergies as of 01/22/2012 - Review Complete 01/22/2012  Allergen Reaction Noted  . Penicillins Anaphylaxis   . Sulfa antibiotics Itching 05/24/2011  . Aspirin Other (See Comments)   . Ibuprofen Nausea Only 10/08/2011  . Morphine and related Itching 07/15/2011   Review of Systems: Gen: Denies any fever, chills, sweats, anorexia, fatigue, weakness, malaise, weight loss, and sleep disorder. CV: Denies chest pain, angina, palpitations, syncope, orthopnea, PND, peripheral edema, and claudication. Resp: Denies dyspnea at rest, dyspnea with exercise, cough, sputum, wheezing, coughing up blood, and pleurisy. GI: Denies vomiting blood, jaundice, and fecal incontinence.   Derm: Denies rash, itching, dry skin, hives, moles, warts, or unhealing ulcers.  Psych: Denies depression, anxiety, memory loss, suicidal ideation,  hallucinations, paranoia, and confusion. Heme: Denies bruising, bleeding, and enlarged lymph nodes.  Physical Exam: BP 97/64  Pulse 82  Temp(Src) 98 F (36.7 C) (Temporal)  Ht 5\' 5"  (1.651 m)  Wt 157 lb 9.6 oz (71.487 kg)  BMI 26.23 kg/m2 General:   Alert,  Well-developed, well-nourished, pleasant and cooperative in NAD Eyes:  Sclera clear, no icterus.   Conjunctiva pink. Mouth:  No deformity or lesions, oropharynx pink and moist. Neck:  Supple; no masses or thyromegaly. Heart:  Regular rate and rhythm; no murmurs, clicks, rubs,  or gallops. Abdomen:  Normal bowel sounds.  No bruits.  Soft and non-distended without masses, hepatosplenomegaly or hernias noted.  + Murphy's point tenderness. No guarding or rebound tenderness.   Rectal:  Deferred. Msk:  Symmetrical without gross deformities.  Pulses:  Normal pulses noted. Extremities:  No clubbing or edema. Neurologic:  Alert and oriented x4;  grossly normal neurologically. Skin:  Intact without significant lesions or rashes.

## 2012-01-22 NOTE — Assessment & Plan Note (Signed)
Continued dexilant 60 mg daily.

## 2012-01-22 NOTE — Assessment & Plan Note (Signed)
[name] is a pleasant 52 y.o. female with history of recurrent C. difficile colitis 2 months ago. She's been on significant antibiotics recently and has been taking prophylactic Flagyl under the direction of Dr. Darrick Penna. No significant diarrhea at this time. Flagyl may be contributing to her nausea.

## 2012-01-23 ENCOUNTER — Telehealth: Payer: Self-pay | Admitting: Urgent Care

## 2012-01-23 MED ORDER — HYDROCODONE-ACETAMINOPHEN 5-500 MG PO TABS: 1.0000 | ORAL_TABLET | ORAL | Status: DC | PRN

## 2012-01-23 NOTE — Telephone Encounter (Signed)
Pt informed

## 2012-01-23 NOTE — Telephone Encounter (Signed)
Yes

## 2012-01-23 NOTE — Telephone Encounter (Signed)
Please be sure she's been able tolerate Vicodin okay for pain in the past.

## 2012-01-23 NOTE — Telephone Encounter (Signed)
Called back and told pt that I will fax Rx to [facility]. She wants to know if she is nauseated in the morning if it is OK to take a phenergan with a sip of water before the HIDA.

## 2012-01-23 NOTE — Telephone Encounter (Signed)
When is patient's HIDA scan scheduled?

## 2012-01-23 NOTE — Telephone Encounter (Signed)
It is scheduled for tomorrow morning

## 2012-01-23 NOTE — Telephone Encounter (Signed)
Pt was informed of all. She has tolerated Vicodin and will come and pick up the prescription for the Vicodin. She has #10 Zofran left. She said she is not having diarrhea but is having formed stool in bits and pieces through out the day. She was also informed her LFT's were normal.

## 2012-01-23 NOTE — Telephone Encounter (Signed)
If she has loose stools, she needs a C. Diff PCR She can try Zofran 4 mg Q8 hours for nausea, #30 no refills Clear liquids only

## 2012-01-23 NOTE — Telephone Encounter (Signed)
Having real bad cramping pains in the right upper quad of her abdomin/staying nauseated/what can she do please advise?

## 2012-01-23 NOTE — Telephone Encounter (Signed)
To ER if severe pain Vicodin when necessary pain Keep HIDA as planned Thanks

## 2012-01-23 NOTE — Telephone Encounter (Signed)
Addended by: Joselyn Arrow on: 01/23/2012 01:30 PM   Modules accepted: Orders

## 2012-01-23 NOTE — Progress Notes (Signed)
Quick Note:  Labs are normal. Await HIDA scan. CC: WALSH,CATHERINE, MD  ______

## 2012-01-23 NOTE — Progress Notes (Signed)
Results Cc to PCP  

## 2012-01-24 ENCOUNTER — Encounter (HOSPITAL_COMMUNITY): Payer: Self-pay

## 2012-01-24 ENCOUNTER — Encounter (HOSPITAL_COMMUNITY)
Admission: RE | Admit: 2012-01-24 | Discharge: 2012-01-24 | Disposition: A | Discharge status: Home / Self Care | Payer: PRIVATE HEALTH INSURANCE | Source: Ambulatory Visit | Attending: Urgent Care | Admitting: Urgent Care

## 2012-01-24 DIAGNOSIS — R110 Nausea: Secondary | ICD-10-CM

## 2012-01-24 DIAGNOSIS — R1011 Right upper quadrant pain: Secondary | ICD-10-CM

## 2012-01-24 DIAGNOSIS — R1013 Epigastric pain: Secondary | ICD-10-CM | POA: Insufficient documentation

## 2012-01-24 MED ORDER — TECHNETIUM TC 99M MEBROFENIN IV KIT
5.0000 | PACK | Freq: Once | INTRAVENOUS | Status: AC | PRN
  Administered 2012-01-24: 5.2 via INTRAVENOUS

## 2012-01-24 MED ORDER — SINCALIDE 5 MCG IJ SOLR
INTRAMUSCULAR | Status: AC
  Administered 2012-01-24: 1.43 ug via INTRAVENOUS
  Filled 2012-01-24: qty 5

## 2012-01-25 ENCOUNTER — Other Ambulatory Visit: Payer: Self-pay

## 2012-01-25 MED ORDER — PROMETHAZINE HCL 25 MG PO TABS: 25.0000 mg | ORAL_TABLET | Freq: Three times a day (TID) | ORAL | Status: AC | PRN

## 2012-01-25 NOTE — Telephone Encounter (Signed)
Pt would like some phenergan called in to her drug store([facility]).Please advise

## 2012-01-25 NOTE — Progress Notes (Signed)
Quick Note:  Please call pt. HIDA scan normal. Her Gallbladder not culprit. Need progress report please. Thanks WU:[email or web address], MD  ______

## 2012-01-28 NOTE — Progress Notes (Signed)
Quick Note:  Called and informed pt. She said she is still getting nauseated everytime after she eats anything. She is having more problems with reflux. She takes Dexilant once daily and up to 4 tums daily. ( She said she had hx of ulcers and she feels she may have more) Please advise! ______

## 2012-01-28 NOTE — Progress Notes (Signed)
Quick Note:  Does [facility] have OV this week available? May need repeat EGD. Hx of c diff. No diarrhea. Thanks ______

## 2012-01-28 NOTE — Progress Notes (Signed)
Quick Note:  OK, let the patient knows that Dr. Darrick Penna is unavailable today. I will be discussing further with her and return her recommendations later in the week. As always, if severe pain, nausea or vomiting, she needs to go to the emergency room. Thanks ______

## 2012-01-28 NOTE — Progress Notes (Signed)
Quick Note:  Pt informed ______ 

## 2012-01-29 NOTE — Progress Notes (Signed)
Quick Note:  LMOM to call. ______ 

## 2012-01-29 NOTE — Progress Notes (Signed)
Quick Note:  Discussed with Dr. Darrick Penna Patient will need GES if this has not been done elsewhere. Re: chronic nausea ______

## 2012-01-30 ENCOUNTER — Other Ambulatory Visit (HOSPITAL_COMMUNITY): Payer: Self-pay | Admitting: Internal Medicine

## 2012-01-30 ENCOUNTER — Other Ambulatory Visit: Payer: Self-pay | Admitting: Gastroenterology

## 2012-01-30 DIAGNOSIS — R110 Nausea: Secondary | ICD-10-CM

## 2012-01-30 DIAGNOSIS — R102 Pelvic and perineal pain: Secondary | ICD-10-CM

## 2012-01-31 ENCOUNTER — Other Ambulatory Visit (HOSPITAL_COMMUNITY): Payer: PRIVATE HEALTH INSURANCE

## 2012-02-01 ENCOUNTER — Ambulatory Visit (HOSPITAL_COMMUNITY): Payer: PRIVATE HEALTH INSURANCE

## 2012-02-01 ENCOUNTER — Encounter: Payer: Self-pay | Admitting: Gastroenterology

## 2012-02-01 ENCOUNTER — Encounter (HOSPITAL_COMMUNITY): Payer: PRIVATE HEALTH INSURANCE

## 2012-02-04 ENCOUNTER — Ambulatory Visit (INDEPENDENT_AMBULATORY_CARE_PROVIDER_SITE_OTHER): Payer: PRIVATE HEALTH INSURANCE | Admitting: Gastroenterology

## 2012-02-04 VITALS — BP 100/72 | HR 72 | Temp 97.6°F | Ht 65.0 in | Wt 159.0 lb

## 2012-02-04 DIAGNOSIS — K589 Irritable bowel syndrome without diarrhea: Secondary | ICD-10-CM

## 2012-02-04 DIAGNOSIS — R110 Nausea: Secondary | ICD-10-CM

## 2012-02-04 NOTE — Assessment & Plan Note (Addendum)
DUE TO UNCONTROLLED GERD AND LESS LIKELY GASTROPARESIS.  COMPLETE GES. STOP SMOKING. STOP DRINKING DIET PEPSI. CONTINUE DEXILANT. FOLLOW GERD LIFESTYLE RECOMMENDATIONS. HO GIVEN. OPV IN 2 MOS.

## 2012-02-04 NOTE — Patient Instructions (Addendum)
COMPLETE YOUR STOMACH TEST. I W ILL CALL YOU WITH THE RESULTS.  STOP SMOKING.  STOP DRINKING DIET PEPSI.  FOLLOW UP IN 2 MOS.  Continue DEXILANT.    Lifestyle and home remedies You may eliminate or reduce the frequency of heartburn by making the following lifestyle changes:    Control your weight. Being overweight is a major risk factor for heartburn and GERD. Excess pounds put pressure on your abdomen, pushing up your stomach and causing acid to back up into your esophagus.     Eat smaller meals. 4 TO 6 MEALS A DAY. This reduces pressure on the lower esophageal sphincter, helping to prevent the valve from opening and acid from washing back into your esophagus.     Loosen your belt. Clothes that fit tightly around your waist put pressure on your abdomen and the lower esophageal sphincter.     Eliminate heartburn triggers. Everyone has specific triggers. Common triggers such as fatty or fried foods, spicy food, tomato sauce, carbonated beverages, alcohol, chocolate, mint, garlic, onion, caffeine and nicotine may make heartburn worse.     Avoid stooping or bending. Tying your shoes is OK. Bending over for longer periods to weed your garden isn't, especially soon after eating.     Don't lie down after a meal. Wait at least three to four hours after eating before going to bed, and don't lie down right after eating.   Alternative medicine   Several home remedies exist for treating GERD, but they provide only temporary relief. They include drinking baking soda (sodium bicarbonate) added to water or drinking other fluids such as baking soda mixed with cream of tartar and water.   Although these liquids create temporary relief by neutralizing, washing away or buffering acids, eventually they aggravate the situation by adding gas and fluid to your stomach, increasing pressure and causing more acid reflux. Further, adding more sodium to your diet may increase your blood pressure and add stress to  your heart, and excessive bicarbonate ingestion can alter the acid-base balance in your body.

## 2012-02-04 NOTE — Assessment & Plan Note (Signed)
SX FAIRLY WELL CONTROLLED. INTERMITTENT ABDOMINAL PAIN LIKLEY DUE TO IBS/?OVARIAN PAIN.  CONTINUE PROBIOTIC. OPV IN 2 MOS.

## 2012-02-04 NOTE — Progress Notes (Signed)
Faxed to PCP

## 2012-02-04 NOTE — Progress Notes (Signed)
  Subjective:    Patient ID: [name], female    DOB: [birth year], 51 y.o.   MRN: [id number]  PCP: [facility]  HPI NAUSEA AT LEAST ONE YEAR. BAD IN MORNINGS AND FEELS LIKE SHE'S GONNA THROWS UP. TAKES A TUMS AND IT GETS BETTER. FEELS LIKE SHE HAS MORE ACID THAN SHE SHOULD. BURPS UP HOT STUFF OCCASIONALLY. SMOKES. DRINKS DIET COKE. Thinks lower abd pain is due to her ovaries. Having a U/S next week and then having her GES. BMS: daily-nl. No vomiting. No blood in stool. Lower abdominal pain every evening. ASA OCCASIONALLY. NO BC. GOODY'S, IBUPROFEN, MOTRIN, OR ALEVE, OR NAPROXEN. NO ETOH.  Past Medical History  Diagnosis Date  . S/P colonoscopy 06/02/09    normal (Dr. Linna Darner)  . PUD (peptic ulcer disease) 01/2007    EGD Dr Jena Gauss, 2 antral ulcers, negative h pylori  . MRSA infection     Dr. Lenice Pressman, currently under treatment  . Hiatal hernia 2008    on EGD above  . Asthma   . PTSD (post-traumatic stress disorder)   . Migraines   . Degenerative disc disease   . Rectal prolapse   . SBO (small bowel obstruction)   . ADHD (attention deficit hyperactivity disorder)   . S/P endoscopy 03/26/11    gastritis-Dr Darrick Penna  . Clostridium difficile colitis 04/10/11 &11/2011    tx w/ flagyl  . Kidney stones   . Chronic abdominal pain 2003    EX LAP APR 2004 RUPTURE L OV CYST, JUL 2004 ADHESIONS  . Irritable bowel syndrome 2004 CONSTIPATION  . BMI (body mass index) 20.0-29.9 2009 127 LBS    Past Surgical History  Procedure Date  . Bowel resection     x2, secondary to adhesions  . Appendectomy   . Laparoscopic lysis intestinal adhesions   . Back surgery   . Abdominal hysterectomy   . Tubal ligation   . Umbilical hernia repair 2008    x5  . Neck surgery 2009  . Partial hysterectomy   . Upper gastrointestinal endoscopy APR 2008 [facility] BLEEDING/PAIN    PUD  . Upper gastrointestinal endoscopy JUL 2012 [facility] PAIN    MILD GASTRITIS  . Esophagogastroduodenoscopy      Normal esophagus  without evidence of Barrett's, mass, erosions, or ulcerations./ Patchy erythema with occasional erosion in the antrum.  Biopsies  obtained via cold forceps to evaluate for H. pylori gastritis/ Small hiatal hernia./Normal duodenal bulb and second portion of the duodenum.     Review of Systems     Objective:   Physical Exam  Constitutional: She is oriented to person, place, and time. She appears well-developed. No distress.  HENT:  Head: Normocephalic and atraumatic.  Mouth/Throat: Oropharynx is clear and moist. No oropharyngeal exudate.  Eyes: Pupils are equal, round, and reactive to light. No scleral icterus.  Neck: Normal range of motion. Neck supple.  Cardiovascular: Normal rate, regular rhythm and normal heart sounds.   Pulmonary/Chest: Effort normal and breath sounds normal. No respiratory distress.  Abdominal: Soft. Bowel sounds are normal. She exhibits no distension. There is tenderness (MILD TTP IN EOIGASTRIUM AND RUQ).  Musculoskeletal: Normal range of motion. She exhibits no edema.  Lymphadenopathy:    She has cervical adenopathy.  Neurological: She is alert and oriented to person, place, and time.       NO FOCAL DEFICITS   Psychiatric: She has a normal mood and affect.          Assessment & Plan:

## 2012-02-05 ENCOUNTER — Ambulatory Visit (HOSPITAL_COMMUNITY): Admission: RE | Admit: 2012-02-05 | Payer: PRIVATE HEALTH INSURANCE | Source: Ambulatory Visit

## 2012-02-06 ENCOUNTER — Emergency Department (HOSPITAL_COMMUNITY)
Admission: EM | Admit: 2012-02-06 | Discharge: 2012-02-06 | Disposition: A | Discharge status: Home / Self Care | Payer: PRIVATE HEALTH INSURANCE | Attending: Emergency Medicine | Admitting: Emergency Medicine

## 2012-02-06 ENCOUNTER — Emergency Department (HOSPITAL_COMMUNITY): Payer: PRIVATE HEALTH INSURANCE

## 2012-02-06 ENCOUNTER — Encounter (HOSPITAL_COMMUNITY): Payer: Self-pay | Admitting: *Deleted

## 2012-02-06 DIAGNOSIS — R110 Nausea: Secondary | ICD-10-CM | POA: Insufficient documentation

## 2012-02-06 DIAGNOSIS — R10814 Left lower quadrant abdominal tenderness: Secondary | ICD-10-CM | POA: Insufficient documentation

## 2012-02-06 DIAGNOSIS — R109 Unspecified abdominal pain: Secondary | ICD-10-CM | POA: Insufficient documentation

## 2012-02-06 LAB — URINALYSIS, ROUTINE W REFLEX MICROSCOPIC
Glucose, UA: NEGATIVE mg/dL
Leukocytes, UA: NEGATIVE
Specific Gravity, Urine: 1.02 (ref 1.005–1.030)
pH: 6 (ref 5.0–8.0)

## 2012-02-06 LAB — CBC
Hemoglobin: 12.2 g/dL (ref 12.0–15.0)
MCH: 30 pg (ref 26.0–34.0)
MCHC: 32.3 g/dL (ref 30.0–36.0)

## 2012-02-06 LAB — COMPREHENSIVE METABOLIC PANEL
Albumin: 3.7 g/dL (ref 3.5–5.2)
BUN: 14 mg/dL (ref 6–23)
Calcium: 9.5 mg/dL (ref 8.4–10.5)
Creatinine, Ser: 0.78 mg/dL (ref 0.50–1.10)
Potassium: 3.9 mEq/L (ref 3.5–5.1)
Total Protein: 6.5 g/dL (ref 6.0–8.3)

## 2012-02-06 LAB — DIFFERENTIAL
Basophils Relative: 1 % (ref 0–1)
Eosinophils Absolute: 0.3 10*3/uL (ref 0.0–0.7)
Monocytes Absolute: 0.8 10*3/uL (ref 0.1–1.0)
Neutro Abs: 6.1 10*3/uL (ref 1.7–7.7)
Neutrophils Relative %: 63 % (ref 43–77)

## 2012-02-06 MED ORDER — HYDROMORPHONE HCL PF 1 MG/ML IJ SOLN
1.0000 mg | Freq: Once | INTRAMUSCULAR | Status: AC
  Administered 2012-02-06: 1 mg via INTRAVENOUS
  Filled 2012-02-06: qty 1

## 2012-02-06 MED ORDER — SODIUM CHLORIDE 0.9 % IV SOLN
Freq: Once | INTRAVENOUS | Status: AC
  Administered 2012-02-06: 20:00:00 via INTRAVENOUS

## 2012-02-06 MED ORDER — HYDROCODONE-ACETAMINOPHEN 5-325 MG PO TABS: 1.0000 | ORAL_TABLET | Freq: Four times a day (QID) | ORAL | Status: AC | PRN

## 2012-02-06 MED ORDER — IOHEXOL 300 MG/ML  SOLN
100.0000 mL | Freq: Once | INTRAMUSCULAR | Status: AC | PRN
  Administered 2012-02-06: 100 mL via INTRAVENOUS

## 2012-02-06 MED ORDER — ONDANSETRON HCL 4 MG/2ML IJ SOLN
4.0000 mg | Freq: Once | INTRAMUSCULAR | Status: AC
  Administered 2012-02-06: 4 mg via INTRAVENOUS
  Filled 2012-02-06: qty 2

## 2012-02-06 NOTE — ED Notes (Signed)
LLQ pain, for 3 weeks, getting worse.Nausea, no vomiting.  Says it is ovarian pain

## 2012-02-06 NOTE — ED Notes (Signed)
Call from CT stating patient's IV had blown after administering IV dye.

## 2012-02-06 NOTE — Progress Notes (Signed)
REVIEWED.  

## 2012-02-06 NOTE — ED Notes (Signed)
Patient finished drinking contrast for CT

## 2012-02-06 NOTE — ED Provider Notes (Signed)
History  This chart was scribed for [name], MD by [name]. The patient was seen in room APA08/APA08. Patient's care was started at 1638.   CSN: [id number]  Arrival date & time 02/06/12  1638   First MD Initiated Contact with Patient 02/06/12 1900      Chief Complaint  Patient presents with  . Abdominal Pain    (Consider location/radiation/quality/duration/timing/severity/associated sxs/prior treatment) Patient is a 51 y.o. female presenting with abdominal pain. The history is provided by the patient. No language interpreter was used.  Abdominal Pain The primary symptoms of the illness include abdominal pain and nausea. The primary symptoms of the illness do not include fever, fatigue, vomiting or diarrhea. The current episode started more than 2 days ago. The onset of the illness was gradual. The problem has been gradually worsening.  The abdominal pain is located in the LLQ. The abdominal pain does not radiate. The abdominal pain is exacerbated by movement.  Symptoms associated with the illness do not include chills, hematuria, frequency or back pain.    [name] is a 51 y.o. female with a history of ovarian cysts who presents to the Emergency Department complaining of 1 month of gradual onset, gradually worsening, constant, moderate abdominal pain localized to the left lower quadrant with associated nausea. Pt states that pain is made worse by moving. Pt reports that pain began while being treated for clostridium difficile colitis a month ago. Pt reports that she visited her PCP (Dr. Clent Ridges), who believes that it is ovarian pain. Pt states that pain is similar to previous episodes of ovarian cysts. Pt reports that she has an ultrasound scheduled with Dr. Clent Ridges tomorrow. Pt has a h/o chronic abdominal pain and has had an appendectomy and hysterectomy. Pt is a current everyday smoker and denies alcohol use.  Past Medical History  Diagnosis Date  . S/P colonoscopy  06/02/09    normal (Dr. Linna Darner)  . PUD (peptic ulcer disease) 01/2007    EGD Dr Jena Gauss, 2 antral ulcers, negative h pylori  . MRSA infection     Dr. Lenice Pressman, currently under treatment  . Hiatal hernia 2008    on EGD above  . Asthma   . PTSD (post-traumatic stress disorder)   . Migraines   . Degenerative disc disease   . Rectal prolapse   . SBO (small bowel obstruction)   . ADHD (attention deficit hyperactivity disorder)   . S/P endoscopy 03/26/11    gastritis-Dr Darrick Penna  . Clostridium difficile colitis 04/10/11 &11/2011    tx w/ flagyl  . Kidney stones   . Chronic abdominal pain 2003    EX LAP APR 2004 RUPTURE L OV CYST, JUL 2004 ADHESIONS  . Irritable bowel syndrome 2004 CONSTIPATION  . BMI (body mass index) 20.0-29.9 2009 127 LBS    Past Surgical History  Procedure Date  . Bowel resection     x2, secondary to adhesions  . Appendectomy   . Laparoscopic lysis intestinal adhesions   . Back surgery   . Abdominal hysterectomy   . Tubal ligation   . Umbilical hernia repair 2008    x5  . Neck surgery 2009  . Partial hysterectomy   . Upper gastrointestinal endoscopy APR 2008 [facility] BLEEDING/PAIN    PUD  . Upper gastrointestinal endoscopy JUL 2012 [facility] PAIN    MILD GASTRITIS  . Esophagogastroduodenoscopy      Normal esophagus without evidence of Barrett's, mass, erosions, or ulcerations./ Patchy erythema with occasional erosion in the  antrum.  Biopsies  obtained via cold forceps to evaluate for H. pylori gastritis/ Small hiatal hernia./Normal duodenal bulb and second portion of the duodenum.    Family History  Problem Relation Age of Onset  . Adopted: Yes    History  Substance Use Topics  . Smoking status: Current Everyday Smoker -- 0.5 packs/day for 1 years    Types: Cigarettes  . Smokeless tobacco: Never Used  . Alcohol Use: No    OB History    Grav Para Term Preterm Abortions TAB SAB Ect Mult Living   1 1 1       1       Review of Systems  Constitutional:  Negative for fever, chills and fatigue.  HENT: Negative for congestion, sinus pressure and ear discharge.   Eyes: Negative for discharge.  Respiratory: Negative for cough.   Cardiovascular: Negative for chest pain.  Gastrointestinal: Positive for nausea and abdominal pain. Negative for vomiting and diarrhea.  Genitourinary: Negative for frequency and hematuria.  Musculoskeletal: Negative for back pain.  Skin: Negative for rash.  Neurological: Negative for seizures and headaches.  Hematological: Negative.   Psychiatric/Behavioral: Negative for hallucinations.    Allergies  Penicillins; Sulfa antibiotics; Aspirin; Ibuprofen; and Morphine and related  Home Medications   Current Outpatient Rx  Name Route Sig Dispense Refill  . ALBUTEROL 90 MCG/ACT IN AERS Inhalation Inhale 2 puffs into the lungs every 6 (six) hours as needed. For shortness of breath    . ALPRAZOLAM 1 MG PO TABS Oral Take 1 mg by mouth 4 (four) times daily as needed. Anxiety    . AMPHETAMINE-DEXTROAMPHET ER 15 MG PO CP24 Oral Take 15 mg by mouth 2 (two) times daily.      Marland Kitchen BACLOFEN 10 MG PO TABS Oral Take 10 mg by mouth as needed. For pain    . BISMUTH SUBSALICYLATE 262 MG/15ML PO SUSP Oral Take 15 mLs by mouth every 6 (six) hours as needed. Diarrhea    . DEXLANSOPRAZOLE 60 MG PO CPDR Oral Take 60 mg by mouth daily.     . DULOXETINE HCL 60 MG PO CPEP Oral Take 120 mg by mouth daily.     Marland Kitchen GABAPENTIN 300 MG PO CAPS Oral Take 600-900 mg by mouth 2 (two) times daily. Takes 600 mg every morning and 900 mg in the evening    . HYDROCODONE-ACETAMINOPHEN 5-500 MG PO TABS Oral Take 1 tablet by mouth every 4 (four) hours as needed for pain. 20 tablet 0  . ACIDOPHILUS PO CAPS Oral Take 2 capsules by mouth daily.     Marland Kitchen LIDOCAINE 5 % EX PTCH Transdermal Place 1 patch onto the skin daily as needed. Uses for pain in back. Not used regularly.    Marland Kitchen LOPERAMIDE HCL 2 MG PO CAPS Oral Take 2 mg by mouth 4 (four) times daily as needed. Diarrhea     . LYRICA 50 MG PO CAPS Oral Take 50 mg by mouth 2 (two) times daily.     Marland Kitchen METRONIDAZOLE 500 MG PO TABS Oral Take 500 mg by mouth 3 (three) times daily.    Marland Kitchen MONTELUKAST SODIUM 10 MG PO TABS Oral Take 10 mg by mouth at bedtime.     . ADULT MULTIVITAMIN W/MINERALS CH Oral Take 1 tablet by mouth daily.      . TOPIRAMATE 25 MG PO TABS Oral Take 25 mg by mouth 3 (three) times daily.     . TRAMADOL HCL 50 MG PO TABS Oral Take 50  mg by mouth every 6 (six) hours as needed. Pain    . ZOLPIDEM TARTRATE 5 MG PO TABS Oral Take 5 mg by mouth at bedtime as needed. For sleep      Triage Vitals: BP 117/62  Pulse 96  Temp(Src) 98.3 F (36.8 C) (Oral)  Resp 20  Ht 5\' 5"  (1.651 m)  Wt 155 lb (70.308 kg)  BMI 25.79 kg/m2  SpO2 98%  Physical Exam  Nursing note and vitals reviewed. Constitutional: She is oriented to person, place, and time. She appears well-developed.  HENT:  Head: Normocephalic and atraumatic.  Eyes: Conjunctivae and EOM are normal. No scleral icterus.  Neck: Neck supple. No thyromegaly present.  Cardiovascular: Normal rate and regular rhythm.  Exam reveals no gallop and no friction rub.   No murmur heard. Pulmonary/Chest: No stridor. She has no wheezes. She has no rales. She exhibits no tenderness.  Abdominal: Soft. She exhibits no distension. There is tenderness (moderate LLQ tenderness).  Musculoskeletal: Normal range of motion. She exhibits no edema.  Lymphadenopathy:    She has no cervical adenopathy.  Neurological: She is alert and oriented to person, place, and time. Coordination normal.  Skin: No rash noted. No erythema.  Psychiatric: She has a normal mood and affect. Her behavior is normal.    ED Course  Procedures (including critical care time)  DIAGNOSTIC STUDIES: Oxygen Saturation is 98% on room air, normal by my interpretation.    COORDINATION OF CARE: 7:10PM - Patient understands and agrees with initial ED impression and plan with expectations set for ED  visit. 10:02PM - Discussed results with pt. Discussed discharge with pain medication. Pt agrees.      Results for orders placed during the hospital encounter of 02/06/12  CBC      Component Value Range   WBC 9.7  4.0 - 10.5 (K/uL)   RBC 4.07  3.87 - 5.11 (MIL/uL)   Hemoglobin 12.2  12.0 - 15.0 (g/dL)   HCT 66.4  40.3 - 47.4 (%)   MCV 92.9  78.0 - 100.0 (fL)   MCH 30.0  26.0 - 34.0 (pg)   MCHC 32.3  30.0 - 36.0 (g/dL)   RDW 25.9  56.3 - 87.5 (%)   Platelets 276  150 - 400 (K/uL)  DIFFERENTIAL      Component Value Range   Neutrophils Relative 63  43 - 77 (%)   Lymphocytes Relative 25  12 - 46 (%)   Monocytes Relative 8  3 - 12 (%)   Eosinophils Relative 3  0 - 5 (%)   Basophils Relative 1  0 - 1 (%)   Neutro Abs 6.1  1.7 - 7.7 (K/uL)   Lymphs Abs 2.4  0.7 - 4.0 (K/uL)   Monocytes Absolute 0.8  0.1 - 1.0 (K/uL)   Eosinophils Absolute 0.3  0.0 - 0.7 (K/uL)   Basophils Absolute 0.1  0.0 - 0.1 (K/uL)  COMPREHENSIVE METABOLIC PANEL      Component Value Range   Sodium 141  135 - 145 (mEq/L)   Potassium 3.9  3.5 - 5.1 (mEq/L)   Chloride 106  96 - 112 (mEq/L)   CO2 28  19 - 32 (mEq/L)   Glucose, Bld 86  70 - 99 (mg/dL)   BUN 14  6 - 23 (mg/dL)   Creatinine, Ser 6.43  0.50 - 1.10 (mg/dL)   Calcium 9.5  8.4 - 32.9 (mg/dL)   Total Protein 6.5  6.0 - 8.3 (g/dL)   Albumin 3.7  3.5 - 5.2 (g/dL)   AST 18  0 - 37 (U/L)   ALT 17  0 - 35 (U/L)   Alkaline Phosphatase 75  39 - 117 (U/L)   Total Bilirubin 0.2 (*) 0.3 - 1.2 (mg/dL)   GFR calc non Af Amer >90  >90 (mL/min)   GFR calc Af Amer >90  >90 (mL/min)  URINALYSIS, ROUTINE W REFLEX MICROSCOPIC      Component Value Range   Color, Urine YELLOW  YELLOW    APPearance CLEAR  CLEAR    Specific Gravity, Urine 1.020  1.005 - 1.030    pH 6.0  5.0 - 8.0    Glucose, UA NEGATIVE  NEGATIVE (mg/dL)   Hgb urine dipstick NEGATIVE  NEGATIVE    Bilirubin Urine NEGATIVE  NEGATIVE    Ketones, ur NEGATIVE  NEGATIVE (mg/dL)   Protein, ur NEGATIVE   NEGATIVE (mg/dL)   Urobilinogen, UA 0.2  0.0 - 1.0 (mg/dL)   Nitrite NEGATIVE  NEGATIVE    Leukocytes, UA NEGATIVE  NEGATIVE     Ct Abdomen Pelvis W Contrast  02/06/2012  *RADIOLOGY REPORT*  Clinical Data: Right lower pelvic pain  CT ABDOMEN AND PELVIS WITH CONTRAST  Technique:  Multidetector CT imaging of the abdomen and pelvis was performed following the standard protocol during bolus administration of intravenous contrast.  Contrast: OMNIPAQUE IOHEXOL 300 MG/ML  SOLN  Comparison: CT 01/05/2012  Findings: Atelectasis at the lung bases unchanged.  No pericardial fluid.  No focal hepatic lesion.  The gallbladder, pancreas, spleen, adrenal glands, and kidneys are normal.  The stomach, small bowel, and cecum are normal.  Moderate volume stool throughout the colon.  There is a colonic colonic anastomoses in the proximal sigmoid colon.  There is no evidence of obstruction.  Abdominal aorta normal caliber.  No retroperitoneal lymphadenopathy.  No free fluid or air within the abdomen.  No free fluid the pelvis.  Bladder is distended.  No pelvic lymphadenopathy.  There is posterior lumbar fusion noted.  No aggressive osseous lesions.  IMPRESSION:  1.  No acute abdominal or pelvic findings. 2.  Sigmoid anastomosis without evidence of obstruction.  Moderate volume stool throughout the colon suggests constipation.  Original Report Authenticated By: Genevive Bi, M.D.     No diagnosis found.    MDM  The chart was scribed for me under my direct supervision.  I personally performed the history, physical, and medical decision making and all procedures in the evaluation of this patient.[name], MD 02/06/12 2203

## 2012-02-06 NOTE — Discharge Instructions (Signed)
Follow up with your md as scheduled °

## 2012-02-07 ENCOUNTER — Ambulatory Visit (HOSPITAL_COMMUNITY)
Admission: RE | Admit: 2012-02-07 | Discharge: 2012-02-07 | Disposition: A | Discharge status: Home / Self Care | Payer: PRIVATE HEALTH INSURANCE | Source: Ambulatory Visit | Attending: Internal Medicine | Admitting: Internal Medicine

## 2012-02-07 ENCOUNTER — Other Ambulatory Visit: Payer: Self-pay

## 2012-02-07 ENCOUNTER — Telehealth: Payer: Self-pay | Admitting: Gastroenterology

## 2012-02-07 DIAGNOSIS — N959 Unspecified menopausal and perimenopausal disorder: Secondary | ICD-10-CM | POA: Insufficient documentation

## 2012-02-07 DIAGNOSIS — R102 Pelvic and perineal pain: Secondary | ICD-10-CM

## 2012-02-07 DIAGNOSIS — R1032 Left lower quadrant pain: Secondary | ICD-10-CM | POA: Insufficient documentation

## 2012-02-07 DIAGNOSIS — N949 Unspecified condition associated with female genital organs and menstrual cycle: Secondary | ICD-10-CM | POA: Insufficient documentation

## 2012-02-07 MED ORDER — PROMETHAZINE HCL 25 MG PO TABS: 25.0000 mg | ORAL_TABLET | Freq: Four times a day (QID) | ORAL | Status: DC | PRN

## 2012-02-07 NOTE — Telephone Encounter (Signed)
Addended by: Tiffany Kocher on: 02/07/2012 01:01 PM   Modules accepted: Orders

## 2012-02-07 NOTE — Telephone Encounter (Signed)
Pt called to ask if we could call in a rx for phenergan to [facility]

## 2012-02-07 NOTE — Telephone Encounter (Signed)
LMOM that Rx has been sent in.  

## 2012-02-11 ENCOUNTER — Telehealth: Payer: Self-pay | Admitting: Gastroenterology

## 2012-02-11 NOTE — Telephone Encounter (Signed)
Please call patient at [phone] she has some concerns that she needs to address with [facility] nurse

## 2012-02-11 NOTE — Telephone Encounter (Signed)
PLEASE CALL PT. SHE HAS IBS. SOMETIMES SHE WILL HAVE DIARRHEA. DUE TO HER AGE SHE WILL NOT TOLERATE WATERY STOOLS VERY WELL. SHE NEEDS TO AVOID DIARY PRODUCTS FOR THE NEXT 2 MOS. TAKE 1 TUMS WITH MEALS THREE TIMES A DAY FOR THE NEXT 2 MOS. KEEP A STOOL DIARY: STOOL CONSISTENCY, # OF STOOL PER DAY. # ACCIDENTS. STOP SMOKING. AVOID DIET COKE. OPV IN 2 MOS E 30 VISIT.

## 2012-02-11 NOTE — Telephone Encounter (Signed)
Called and informed pt.  

## 2012-02-11 NOTE — Telephone Encounter (Signed)
Pt called and said that her Pelvic US was fine. Her PCP told her to follow up with GI. She is having some leakage9 incontinence of BM). [place] Spaniel it is very aggravating and she is still having some nausea. Please advise!

## 2012-02-11 NOTE — Telephone Encounter (Signed)
REVIEWED.  

## 2012-02-12 ENCOUNTER — Other Ambulatory Visit (HOSPITAL_COMMUNITY): Payer: PRIVATE HEALTH INSURANCE

## 2012-02-12 NOTE — Telephone Encounter (Signed)
Reminder in epic to follow up in 2 months with SF in E30 

## 2012-02-26 ENCOUNTER — Other Ambulatory Visit: Payer: Self-pay | Admitting: Gastroenterology

## 2012-02-26 MED ORDER — PROMETHAZINE HCL 25 MG PO TABS: 25.0000 mg | ORAL_TABLET | Freq: Four times a day (QID) | ORAL | Status: DC | PRN

## 2012-02-26 NOTE — Telephone Encounter (Signed)
Patient is requesting a refill on her Phenergan please advise?

## 2012-02-26 NOTE — Telephone Encounter (Signed)
Noted  

## 2012-02-26 NOTE — Telephone Encounter (Signed)
Did patient ever get gastric emptying study ordered? Please let her know phenergan is generally not for long term use. Will RF for short term.

## 2012-02-26 NOTE — Telephone Encounter (Signed)
I informed pt. She cancelled the Gastric Emptying because of a lot going on at her apt. She said she will call radiology tomorrow and reschedule.

## 2012-02-27 ENCOUNTER — Ambulatory Visit: Payer: PRIVATE HEALTH INSURANCE | Admitting: Gastroenterology

## 2012-02-28 NOTE — Progress Notes (Signed)
Reminder in epic to follow up in 2 months with SF in E30 

## 2012-03-10 ENCOUNTER — Encounter: Payer: Self-pay | Admitting: Gastroenterology

## 2012-03-14 ENCOUNTER — Telehealth: Payer: Self-pay | Admitting: Gastroenterology

## 2012-03-14 DIAGNOSIS — R197 Diarrhea, unspecified: Secondary | ICD-10-CM

## 2012-03-14 MED ORDER — PROMETHAZINE HCL 12.5 MG PO TABS: 12.5000 mg | ORAL_TABLET | Freq: Four times a day (QID) | ORAL | Status: AC | PRN

## 2012-03-14 NOTE — Telephone Encounter (Signed)
Sx began 3 days ago-started to get bad yesterday evening. Ate a little cheeses.PT C/O DIARRHEA(EVERY 4 HOURS) AN EXPLOSION(WATERY, SML STOOLS), ABDOMINAL PAIN: CRAMPING RUQ, NAUSEA, SWEATING. Drinking almond milk. No abx. NO FEVER. TCS if Sx not resolved. STAY AWAY FROM MILK/DIARY PRODUCTS. PHENERGAN PRN FOR NAUSEA/VOMITING.  CHECK CDIFF. IF POS, FLAGYL 500 MG TID FOR 10 DAYS. PT WILL PICK UP CONTAINED FROM [facility] FRONT DESK.

## 2012-03-16 ENCOUNTER — Encounter (HOSPITAL_COMMUNITY): Payer: Self-pay | Admitting: *Deleted

## 2012-03-16 ENCOUNTER — Emergency Department (HOSPITAL_COMMUNITY)
Admission: EM | Admit: 2012-03-16 | Discharge: 2012-03-16 | Disposition: A | Discharge status: Home / Self Care | Payer: PRIVATE HEALTH INSURANCE | Attending: Emergency Medicine | Admitting: Emergency Medicine

## 2012-03-16 ENCOUNTER — Emergency Department (HOSPITAL_COMMUNITY): Payer: PRIVATE HEALTH INSURANCE

## 2012-03-16 DIAGNOSIS — R109 Unspecified abdominal pain: Secondary | ICD-10-CM

## 2012-03-16 DIAGNOSIS — G8929 Other chronic pain: Secondary | ICD-10-CM | POA: Insufficient documentation

## 2012-03-16 DIAGNOSIS — Z79899 Other long term (current) drug therapy: Secondary | ICD-10-CM | POA: Insufficient documentation

## 2012-03-16 DIAGNOSIS — IMO0002 Reserved for concepts with insufficient information to code with codable children: Secondary | ICD-10-CM | POA: Insufficient documentation

## 2012-03-16 DIAGNOSIS — F909 Attention-deficit hyperactivity disorder, unspecified type: Secondary | ICD-10-CM | POA: Insufficient documentation

## 2012-03-16 DIAGNOSIS — Z9089 Acquired absence of other organs: Secondary | ICD-10-CM | POA: Insufficient documentation

## 2012-03-16 DIAGNOSIS — F17200 Nicotine dependence, unspecified, uncomplicated: Secondary | ICD-10-CM | POA: Insufficient documentation

## 2012-03-16 DIAGNOSIS — K589 Irritable bowel syndrome without diarrhea: Secondary | ICD-10-CM | POA: Insufficient documentation

## 2012-03-16 DIAGNOSIS — J45909 Unspecified asthma, uncomplicated: Secondary | ICD-10-CM | POA: Insufficient documentation

## 2012-03-16 LAB — COMPREHENSIVE METABOLIC PANEL
ALT: 9 U/L (ref 0–35)
AST: 16 U/L (ref 0–37)
Albumin: 3.9 g/dL (ref 3.5–5.2)
Calcium: 10 mg/dL (ref 8.4–10.5)
Creatinine, Ser: 0.61 mg/dL (ref 0.50–1.10)
Sodium: 141 mEq/L (ref 135–145)
Total Protein: 6.3 g/dL (ref 6.0–8.3)

## 2012-03-16 LAB — CBC
Hemoglobin: 12.3 g/dL (ref 12.0–15.0)
MCV: 91.1 fL (ref 78.0–100.0)
Platelets: 260 10*3/uL (ref 150–400)
RBC: 4.05 MIL/uL (ref 3.87–5.11)
WBC: 7.6 10*3/uL (ref 4.0–10.5)

## 2012-03-16 LAB — DIFFERENTIAL
Eosinophils Relative: 4 % (ref 0–5)
Lymphocytes Relative: 23 % (ref 12–46)
Lymphs Abs: 1.8 10*3/uL (ref 0.7–4.0)
Monocytes Relative: 6 % (ref 3–12)
Neutrophils Relative %: 66 % (ref 43–77)

## 2012-03-16 MED ORDER — HYDROCODONE-ACETAMINOPHEN 5-325 MG PO TABS: ORAL_TABLET | ORAL | Status: DC

## 2012-03-16 MED ORDER — ONDANSETRON HCL 4 MG/2ML IJ SOLN
4.0000 mg | Freq: Once | INTRAMUSCULAR | Status: AC
  Administered 2012-03-16: 4 mg via INTRAVENOUS
  Filled 2012-03-16: qty 2

## 2012-03-16 MED ORDER — HYDROMORPHONE HCL PF 1 MG/ML IJ SOLN
1.0000 mg | Freq: Once | INTRAMUSCULAR | Status: AC
  Administered 2012-03-16: 1 mg via INTRAVENOUS
  Filled 2012-03-16: qty 1

## 2012-03-16 MED ORDER — SODIUM CHLORIDE 0.9 % IV BOLUS (SEPSIS)
500.0000 mL | Freq: Once | INTRAVENOUS | Status: AC
  Administered 2012-03-16: 10:00:00 via INTRAVENOUS

## 2012-03-16 NOTE — ED Notes (Signed)
Attempted IV x2 without success  

## 2012-03-16 NOTE — ED Notes (Signed)
2 failed IV attempts. Right AC and L thumb.

## 2012-03-16 NOTE — ED Provider Notes (Signed)
History     CSN: [id number]  Arrival date & time 03/16/12  4098   First MD Initiated Contact with Patient 03/16/12 [phone]      Chief Complaint  Patient presents with  . Abdominal Pain    (Consider location/radiation/quality/duration/timing/severity/associated sxs/prior treatment) HPI Comments: Patient c/o upper abdominal pain for 4 days with nausea and diarrhea.  Patient has hx of chronic abdominal pain and C. Difficile infections.  States that she contacted her GI, Dr. Darrick Penna when the diarrhea began and was ordered stool cultures to obtain, but pt states that the next day the the diarrhea stopped.  Comes to ED mainly due to the persistent pain.  She denies fever, vomiting, bloody stools or hematemesis or recent antibiotics.  State the pain feels similar to previous exacerbations of her chronic pain.    Patient is a 51 y.o. female presenting with abdominal pain. The history is provided by the patient.  Abdominal Pain The primary symptoms of the illness include abdominal pain, nausea and diarrhea. The primary symptoms of the illness do not include fever, shortness of breath, vomiting, hematemesis, hematochezia, dysuria, vaginal discharge or vaginal bleeding. The current episode started more than 2 days ago. The onset of the illness was gradual. The problem has been gradually improving.  The abdominal pain began more than 2 days ago. The pain came on gradually. The abdominal pain has been unchanged since its onset. The abdominal pain is located in the epigastric region. The abdominal pain does not radiate. The abdominal pain is relieved by nothing. Exacerbated by: nothing.  The diarrhea began 3 to 5 days ago. The diarrhea is watery and semi-solid. The diarrhea occurs 2 to 4 times per day.  The patient states that she believes she is currently not pregnant. The patient has not had a change in bowel habit. Symptoms associated with the illness do not include chills, anorexia, diaphoresis, heartburn,  constipation, urgency, hematuria, frequency or back pain. Significant associated medical issues do not include diabetes. Associated medical issues comments: chronic abdominal pain.    Past Medical History  Diagnosis Date  . S/P colonoscopy 06/02/09    normal (Dr. Linna Darner)  . PUD (peptic ulcer disease) 01/2007    EGD Dr Jena Gauss, 2 antral ulcers, negative h pylori  . MRSA infection     Dr. Lenice Pressman, currently under treatment  . Hiatal hernia 2008    on EGD above  . Asthma   . PTSD (post-traumatic stress disorder)   . Migraines   . Degenerative disc disease   . Rectal prolapse   . SBO (small bowel obstruction)   . ADHD (attention deficit hyperactivity disorder)   . S/P endoscopy 03/26/11    gastritis-Dr Darrick Penna  . Clostridium difficile colitis 04/10/11 &11/2011    tx w/ flagyl  . Kidney stones   . Chronic abdominal pain 2003    EX LAP APR 2004 RUPTURE L OV CYST, JUL 2004 ADHESIONS  . Irritable bowel syndrome 2004 CONSTIPATION  . BMI (body mass index) 20.0-29.9 2009 127 LBS    Past Surgical History  Procedure Date  . Bowel resection     x2, secondary to adhesions  . Appendectomy   . Laparoscopic lysis intestinal adhesions   . Back surgery   . Abdominal hysterectomy   . Tubal ligation   . Umbilical hernia repair 2008    x5  . Neck surgery 2009  . Partial hysterectomy   . Upper gastrointestinal endoscopy APR 2008 [facility] BLEEDING/PAIN    PUD  . Upper  gastrointestinal endoscopy JUL 2012 [facility] PAIN    MILD GASTRITIS  . Esophagogastroduodenoscopy      Normal esophagus without evidence of Barrett's, mass, erosions, or ulcerations./ Patchy erythema with occasional erosion in the antrum.  Biopsies  obtained via cold forceps to evaluate for H. pylori gastritis/ Small hiatal hernia./Normal duodenal bulb and second portion of the duodenum.    Family History  Problem Relation Age of Onset  . Adopted: Yes    History  Substance Use Topics  . Smoking status: Current Everyday Smoker  -- 0.5 packs/day for 1 years    Types: Cigarettes  . Smokeless tobacco: Never Used  . Alcohol Use: No    OB History    Grav Para Term Preterm Abortions TAB SAB Ect Mult Living   1 1 1       1       Review of Systems  Constitutional: Positive for appetite change. Negative for fever, chills, diaphoresis and activity change.  Respiratory: Negative for chest tightness and shortness of breath.   Gastrointestinal: Positive for nausea, abdominal pain and diarrhea. Negative for heartburn, vomiting, constipation, blood in stool, hematochezia, abdominal distention, anorexia and hematemesis.  Genitourinary: Negative for dysuria, urgency, frequency, hematuria, vaginal bleeding, vaginal discharge and difficulty urinating.  Musculoskeletal: Negative for back pain and arthralgias.  Skin: Negative.   Neurological: Negative for weakness and numbness.  All other systems reviewed and are negative.    Allergies  Penicillins; Sulfa antibiotics; Aspirin; Ibuprofen; Lactaid; and Morphine and related  Home Medications   Current Outpatient Rx  Name Route Sig Dispense Refill  . ALBUTEROL 90 MCG/ACT IN AERS Inhalation Inhale 2 puffs into the lungs every 6 (six) hours as needed. For shortness of breath    . ALPRAZOLAM 1 MG PO TABS Oral Take 1 mg by mouth 4 (four) times daily as needed. Anxiety    . AMPHETAMINE-DEXTROAMPHET ER 15 MG PO CP24 Oral Take 15 mg by mouth 2 (two) times daily.      Marland Kitchen BISMUTH SUBSALICYLATE 262 MG/15ML PO SUSP Oral Take 15 mLs by mouth daily as needed. Diarrhea    . DEXLANSOPRAZOLE 60 MG PO CPDR Oral Take 60 mg by mouth every morning.     . DULOXETINE HCL 60 MG PO CPEP Oral Take 120 mg by mouth every morning.     Marland Kitchen FAMOTIDINE 20 MG PO TABS Oral Take 20 mg by mouth 3 (three) times daily.    Marland Kitchen GABAPENTIN 300 MG PO CAPS Oral Take 600-900 mg by mouth 2 (two) times daily. Takes 600 mg every morning and 900 mg in the evening    . ACIDOPHILUS PO CAPS Oral Take 2 capsules by mouth daily.       Marland Kitchen LIDOCAINE 5 % EX PTCH Transdermal Place 1 patch onto the skin daily as needed. Uses for pain in back. Not used regularly.    Marland Kitchen MONTELUKAST SODIUM 10 MG PO TABS Oral Take 10 mg by mouth daily.     . ADULT MULTIVITAMIN W/MINERALS CH Oral Take 1 tablet by mouth daily.    Marland Kitchen PROMETHAZINE HCL 12.5 MG PO TABS Oral Take 1 tablet (12.5 mg total) by mouth every 6 (six) hours as needed for nausea. 30 tablet 0  . PROMETHAZINE HCL 25 MG PO TABS Oral Take 25 mg by mouth every 6 (six) hours as needed. For nausea    . SINUS WASH NETI POT NA Nasal Place into the nose as needed. For sinus congestion    . TOPIRAMATE 25 MG  PO TABS Oral Take 25 mg by mouth 3 (three) times daily.     Marland Kitchen ZOLPIDEM TARTRATE 5 MG PO TABS Oral Take 5 mg by mouth at bedtime as needed. For sleep      BP 103/63  Pulse 58  Temp(Src) 98.4 F (36.9 C) (Oral)  Resp 20  Ht 5\' 5"  (1.651 m)  Wt 153 lb (69.4 kg)  BMI 25.46 kg/m2  SpO2 94%  Physical Exam  Nursing note and vitals reviewed. Constitutional: She is oriented to person, place, and time. She appears well-developed and well-nourished. No distress.  HENT:  Head: Normocephalic and atraumatic.  Mouth/Throat: Oropharynx is clear and moist.  Neck: Normal range of motion. Neck supple.  Cardiovascular: Normal rate, regular rhythm, normal heart sounds and intact distal pulses.   No murmur heard. Pulmonary/Chest: Effort normal and breath sounds normal.  Abdominal: Soft. Bowel sounds are normal. She exhibits no distension and no mass. There is no hepatosplenomegaly. There is tenderness in the epigastric area. There is no rigidity, no rebound, no guarding, no CVA tenderness and no tenderness at McBurney's point.       Mild epigastric tenderness, no peritoneal signs  Musculoskeletal: She exhibits no edema.  Lymphadenopathy:    She has no cervical adenopathy.  Neurological: She is alert and oriented to person, place, and time. She exhibits normal muscle tone. Coordination normal.  Skin:  Skin is warm and dry.    ED Course  Procedures (including critical care time)  Results for orders placed during the hospital encounter of 03/16/12  CBC      Component Value Range   WBC 7.6  4.0 - 10.5 (K/uL)   RBC 4.05  3.87 - 5.11 (MIL/uL)   Hemoglobin 12.3  12.0 - 15.0 (g/dL)   HCT 16.1  09.6 - 04.5 (%)   MCV 91.1  78.0 - 100.0 (fL)   MCH 30.4  26.0 - 34.0 (pg)   MCHC 33.3  30.0 - 36.0 (g/dL)   RDW 40.9  81.1 - 91.4 (%)   Platelets 260  150 - 400 (K/uL)  DIFFERENTIAL      Component Value Range   Neutrophils Relative 66  43 - 77 (%)   Neutro Abs 5.1  1.7 - 7.7 (K/uL)   Lymphocytes Relative 23  12 - 46 (%)   Lymphs Abs 1.8  0.7 - 4.0 (K/uL)   Monocytes Relative 6  3 - 12 (%)   Monocytes Absolute 0.5  0.1 - 1.0 (K/uL)   Eosinophils Relative 4  0 - 5 (%)   Eosinophils Absolute 0.3  0.0 - 0.7 (K/uL)   Basophils Relative 1  0 - 1 (%)   Basophils Absolute 0.1  0.0 - 0.1 (K/uL)  COMPREHENSIVE METABOLIC PANEL      Component Value Range   Sodium 141  135 - 145 (mEq/L)   Potassium 3.6  3.5 - 5.1 (mEq/L)   Chloride 107  96 - 112 (mEq/L)   CO2 24  19 - 32 (mEq/L)   Glucose, Bld 103 (*) 70 - 99 (mg/dL)   BUN 16  6 - 23 (mg/dL)   Creatinine, Ser 7.82  0.50 - 1.10 (mg/dL)   Calcium 95.6  8.4 - 10.5 (mg/dL)   Total Protein 6.3  6.0 - 8.3 (g/dL)   Albumin 3.9  3.5 - 5.2 (g/dL)   AST 16  0 - 37 (U/L)   ALT 9  0 - 35 (U/L)   Alkaline Phosphatase 85  39 - 117 (U/L)  Total Bilirubin 0.2 (*) 0.3 - 1.2 (mg/dL)   GFR calc non Af Amer >90  >90 (mL/min)   GFR calc Af Amer >90  >90 (mL/min)  LIPASE, BLOOD      Component Value Range   Lipase 26  11 - 59 (U/L)     Dg Abd 1 View  03/16/2012  *RADIOLOGY REPORT*  Clinical Data: Abdominal pain  ABDOMEN - 1 VIEW  Comparison: 03/17/2011  Findings: There is nonspecific nonobstructive bowel gas pattern. Stable postsurgical changes lumbosacral spine.  Again noted post ventral hernia repair changes.  Stool noted in the left colon and rectosigmoid  colon.  IMPRESSION: Nonspecific nonobstructive bowel gas pattern.  Stable postoperative changes.  Original Report Authenticated By: Natasha Mead, M.D.        MDM    Previous medical charts, nursing notes and vitals signs from this visit were reviewed by me   All laboratory results and/or imaging results performed on this visit, if applicable, were reviewed by me and discussed with the patient and/or parent as well as recommendation for follow-up    MEDICATIONS GIVEN IN ED: zofran, dilaudid IV  Patient is feeling better, vital signs are stable, she is nontoxic appearing. Diffuse, mild abdominal tenderness without guarding, rebound, or peritoneal signs. Abdomen remains soft.  Patient is currently followed by GI , Dr. Darrick Penna.  She agrees to close followup with Dr. Darrick Penna tomorrow. She appears stable for discharge at this time.  Has uncollected stool culture tubes at home. Patient also seen by EDP and care plan discussed    PRESCRIPTIONS GIVEN AT DISCHARGE: norco # 15   Pt stable in ED with no significant deterioration in condition. Pt feels improved after observation and/or treatment in ED. Patient / Family / Caregiver understand and agree with initial ED impression and plan with expectations set for ED visit.  Patient agrees to return to ED for any worsening symptoms          Kyland No L. [place], [place] 03/21/12 1257

## 2012-03-16 NOTE — ED Notes (Signed)
Upper abd pain with nausea and diarrhea x 4 days.  Called GI and given Rx for phenergan, reports does not help.  States diarrhea stopped yesterday and has not had BM since.  Also reports taking bentyl without relief.  Denies vomiting.

## 2012-03-17 ENCOUNTER — Telehealth: Payer: Self-pay | Admitting: Gastroenterology

## 2012-03-17 NOTE — Telephone Encounter (Signed)
Pt said she had called Dr. Darrick Penna on Friday and she ordered C-Diff and had her come by the hospital to pick up a container. But she had taken Pepto and so she has not had another BM since. She had had a lot of stomach cramps and nausea over the week-end and went to the ED on Sun. Was given some phenergan and #15 pain pills. Said she was given IV fluids for dehydration. She said she thought that doctor Fields was going to order a colonoscopy. Please advise!

## 2012-03-17 NOTE — Telephone Encounter (Signed)
Patient having pain, nausea and diarrhea, she took pepto bismal and hasnt had another bowel movement and cant get the c-diff sample, c/o nausea ,stomach cramps, please advise ?

## 2012-03-17 NOTE — Telephone Encounter (Signed)
Pt called to make appointment and she is coming June 13 @ 9 :30. She want to let Tyler Aas know that her tongue is numb all the want down and nothing will help it.

## 2012-03-17 NOTE — Telephone Encounter (Signed)
I TOLD PT IF SHE DID NOT HAVE CDIFF SHE MAY NEED A TCS. NEEDS OPV TO DISCUSS E 30 DX: DIARRHEA

## 2012-03-17 NOTE — Telephone Encounter (Signed)
LMOM for pt that she needs OV to determine the need for colonoscopy.

## 2012-03-17 NOTE — Telephone Encounter (Signed)
Called pt. She said she did not say anything about her tongue, that her abdomen hurts a lot. That she is still dehydrated but she is drinking lots of water and cool aid.

## 2012-03-18 NOTE — Telephone Encounter (Signed)
REVIEWED.  

## 2012-03-20 ENCOUNTER — Ambulatory Visit (INDEPENDENT_AMBULATORY_CARE_PROVIDER_SITE_OTHER): Payer: PRIVATE HEALTH INSURANCE | Admitting: Gastroenterology

## 2012-03-20 ENCOUNTER — Encounter (HOSPITAL_COMMUNITY): Payer: Self-pay | Admitting: Pharmacy Technician

## 2012-03-20 ENCOUNTER — Encounter: Payer: Self-pay | Admitting: Gastroenterology

## 2012-03-20 VITALS — BP 84/63 | HR 80 | Temp 97.4°F | Ht 65.0 in | Wt 159.4 lb

## 2012-03-20 DIAGNOSIS — R109 Unspecified abdominal pain: Secondary | ICD-10-CM

## 2012-03-20 DIAGNOSIS — R197 Diarrhea, unspecified: Secondary | ICD-10-CM

## 2012-03-20 MED ORDER — SODIUM CHLORIDE 0.45 % IV SOLN
Freq: Once | INTRAVENOUS | Status: AC
  Administered 2012-03-21: 1000 mL via INTRAVENOUS

## 2012-03-20 NOTE — Progress Notes (Signed)
Subjective:    Patient ID: [name], female    DOB: Jan 16, 1961, 51 y.o.   MRN: [id number]  PCP: [facility]  HPI NL BWN STOOL IN IT. INTERMITTENT  WATERY STOOLS ASSOCIATED WITH NAUSEA. NO VOMTIING. DIARRHEA CAN BE SO BAD SHE FEELS/GETS DEHYDRATED. HAS GONE TO ED. BMs: WATERY(BAD DAY: Q1-2 H STOOLS)-CAN'T LEAVE THE ROOM. NO RECENT BLOOD. NO WEIGHT LOSS. APPETITE: NOT GOOD-FEELS NAUSEOUS. TAKES A LOT OF PEPTO WHEN SHE GETS DIARRHEA. PEPTO HELPS. LAST FLARE LASTED-4 DAYS. Eats and then stomach makes lots of noise. FATHER WAS ONE OF THE FIRST GI DOCS [place].  Takes Nature's choice probiotic daily. STILL HAS GB.    Past Medical History  Diagnosis Date  . S/P colonoscopy 06/02/09    normal (Dr. Linna Darner)  . PUD (peptic ulcer disease) 01/2007    EGD Dr Jena Gauss, 2 antral ulcers, negative h pylori  . MRSA infection     Dr. Lenice Pressman, currently under treatment  . Hiatal hernia 2008    on EGD above  . Asthma   . PTSD (post-traumatic stress disorder)   . Migraines   . Degenerative disc disease   . Rectal prolapse   . SBO (small bowel obstruction)   . ADHD (attention deficit hyperactivity disorder)   . S/P endoscopy 03/26/11    gastritis-Dr Darrick Penna  . Clostridium difficile colitis 04/10/11 &11/2011    tx w/ flagyl  . Kidney stones   . Chronic abdominal pain 2003    EX LAP APR 2004 RUPTURE L OV CYST, JUL 2004 ADHESIONS  . Irritable bowel syndrome 2004 CONSTIPATION  . BMI (body mass index) 20.0-29.9 2009 127 LBS    Past Surgical History  Procedure Date  . Bowel resection     x2, secondary to adhesions  . Appendectomy   . Laparoscopic lysis intestinal adhesions   . Back surgery   . Abdominal hysterectomy   . Tubal ligation   . Umbilical hernia repair 2008    x5  . Neck surgery 2009  . Partial hysterectomy   . Upper gastrointestinal endoscopy APR 2008 [facility] BLEEDING/PAIN    PUD  . Upper gastrointestinal endoscopy JUL 2012 [facility] PAIN    MILD GASTRITIS  .  Esophagogastroduodenoscopy      Normal esophagus without evidence of Barrett's, mass, erosions, or ulcerations./ Patchy erythema with occasional erosion in the antrum.  Biopsies  obtained via cold forceps to evaluate for H. pylori gastritis/ Small hiatal hernia./Normal duodenal bulb and second portion of the duodenum.      Review of Systems     Objective:   Physical Exam  Vitals reviewed. Constitutional: She is oriented to person, place, and time. She appears well-developed and well-nourished. No distress.  HENT:  Head: Normocephalic and atraumatic.  Mouth/Throat: Oropharynx is clear and moist. No oropharyngeal exudate.  Eyes: Pupils are equal, round, and reactive to light. No scleral icterus.  Neck: Normal range of motion. Neck supple.  Cardiovascular: Normal rate, regular rhythm and normal heart sounds.   Pulmonary/Chest: Effort normal and breath sounds normal. No respiratory distress.  Abdominal: Soft. Bowel sounds are normal. She exhibits no distension. There is tenderness (MILD TTP IN LUQ, MOD TTP IN LLQ). There is no rebound and no guarding.  Musculoskeletal: She exhibits no edema.  Neurological: She is alert and oriented to person, place, and time.       NO FOCAL DEFICITS   Psychiatric:       FLAT AFFECT, ANXIOUS MOOD  Assessment & Plan:

## 2012-03-20 NOTE — Patient Instructions (Addendum)
COLONOSCOPY AND EGD TOMORROW. I WILL BIOPSY YOUR COLON AND SMALL BOWEL.  FOLLOW UP IN 6 WEEKS.       Irritable Bowel Syndrome (Spastic Colon) Irritable Bowel Syndrome (IBS) is caused by a disturbance of normal bowel function. Other terms used are spastic colon, mucous colitis, and irritable colon. It does not require surgery, nor does it lead to cancer. There is no cure for IBS. But with proper diet, stress reduction, and medication, you will find that your problems (symptoms) will gradually disappear or improve. IBS is a common digestive disorder. It usually appears in late adolescence or early adulthood. Women develop it twice as often as men.  CAUSES After food has been digested and absorbed in the small intestine, waste material is moved into the colon (large intestine). In the colon, water and salts are absorbed from the undigested products coming from the small intestine. The remaining residue, or fecal material, is held for elimination. Under normal circumstances, gentle, rhythmic contractions on the bowel walls push the fecal material along the colon towards the rectum. In IBS, however, these contractions are irregular and poorly coordinated. The fecal material is either retained too long, resulting in constipation, or expelled too soon, producing diarrhea.  SYMPTOMS  The most common symptom of IBS is pain. It is typically in the lower left side of the belly (abdomen). But it may occur anywhere in the abdomen. It can be felt as heartburn, backache, or even as a dull pain in the arms or shoulders. The pain comes from excessive bowel-muscle spasms and from the buildup of gas and fecal material in the colon. This pain:  Can range from sharp belly (abdominal) cramps to a dull, continuous ache.   Usually worsens soon after eating.   Is typically relieved by having a bowel movement or passing gas.  Abdominal pain is usually accompanied by constipation. But it may also produce diarrhea. The  diarrhea typically occurs right after a meal or upon arising in the morning. The stools are typically soft and watery. They are often flecked with secretions (mucus).  Other symptoms of IBS include:  Bloating.  Loss of appetite.   Heartburn.  Feeling sick to your stomach  (nausea).   Belching  Vomiting   Gas.  IBS may also cause a number of symptoms that are unrelated to the digestive system:  Fatigue.  Headaches.   Anxiety  Shortness of breath   Difficulty in concentrating.  Dizziness.   These symptoms tend to come and go.  TREATMENT A number of medications are available to help correct bowel function and/or relieve bowel spasms and abdominal pain. Among the drugs available are:  Mild, non-irritating laxatives for severe constipation and to help restore normal bowel habits.   Specific anti-diarrheal medications (BENTYL) to treat severe or prolonged diarrhea.   Anti-spasmodic agents to relieve intestinal cramps.   HOME CARE INSTRUCTIONS   Avoid foods that are high in fat or oils. Some examples ZOX:WRUEA cream, butter, frankfurters, sausage, and other fatty meats.   Avoid foods that have a laxative effect, such as fruit, fruit juice, and dairy products.   Cut out carbonated drinks, chewing gum, and "gassy" foods, such as beans and cabbage. This may help relieve bloating and belching.   Bran taken with plenty of liquids may help relieve constipation.   Keep track of what foods seem to trigger your symptoms.   Avoid emotionally charged situations or circumstances that produce anxiety.   Start or continue exercising.   Get plenty  of rest and sleep.

## 2012-03-20 NOTE — Progress Notes (Signed)
Pt is aware of OV on 7/25 @ 10 with SF in E30 and appt card was mailed

## 2012-03-20 NOTE — Progress Notes (Signed)
Faxed to PCP

## 2012-03-20 NOTE — Assessment & Plan Note (Signed)
ASSOCIATED WITH CHRONIC NAUSEA MOST LIKEY DUE TO IBS. DIEFFERENTIAL DIAGNOSIS INCLUDES CELAIC SPRUE, TERMINAL ILEITIS, MICROSCOPIC COLITIS, AND LESS LIKELY VILLOUS ADENOMA OR COLON CA.  EGD/TCS JUN 14 AND WILL EXAM TI, AS WELL AS Bx COLON & SMALL BOWEL. NEEDS PEDS SCOPE AND PHENERGAN. CONTINUE PEPTO & DEXILANT. OPV IN 6 WEEKS.

## 2012-03-21 ENCOUNTER — Ambulatory Visit (HOSPITAL_COMMUNITY): Payer: PRIVATE HEALTH INSURANCE

## 2012-03-21 ENCOUNTER — Encounter (HOSPITAL_COMMUNITY)
Admission: RE | Disposition: A | Discharge status: Home Health | Payer: Self-pay | Source: Ambulatory Visit | Attending: Gastroenterology

## 2012-03-21 ENCOUNTER — Encounter (HOSPITAL_COMMUNITY): Payer: Self-pay | Admitting: *Deleted

## 2012-03-21 ENCOUNTER — Ambulatory Visit (HOSPITAL_COMMUNITY)
Admission: RE | Admit: 2012-03-21 | Discharge: 2012-03-21 | Disposition: A | Discharge status: Home Health | Payer: PRIVATE HEALTH INSURANCE | Source: Ambulatory Visit | Attending: Gastroenterology | Admitting: Gastroenterology

## 2012-03-21 DIAGNOSIS — R197 Diarrhea, unspecified: Secondary | ICD-10-CM | POA: Insufficient documentation

## 2012-03-21 DIAGNOSIS — K648 Other hemorrhoids: Secondary | ICD-10-CM | POA: Insufficient documentation

## 2012-03-21 DIAGNOSIS — K621 Rectal polyp: Secondary | ICD-10-CM

## 2012-03-21 DIAGNOSIS — D128 Benign neoplasm of rectum: Secondary | ICD-10-CM | POA: Insufficient documentation

## 2012-03-21 DIAGNOSIS — K2940 Chronic atrophic gastritis without bleeding: Secondary | ICD-10-CM | POA: Insufficient documentation

## 2012-03-21 DIAGNOSIS — D126 Benign neoplasm of colon, unspecified: Secondary | ICD-10-CM | POA: Insufficient documentation

## 2012-03-21 DIAGNOSIS — R109 Unspecified abdominal pain: Secondary | ICD-10-CM

## 2012-03-21 DIAGNOSIS — D129 Benign neoplasm of anus and anal canal: Secondary | ICD-10-CM | POA: Insufficient documentation

## 2012-03-21 DIAGNOSIS — K2980 Duodenitis without bleeding: Secondary | ICD-10-CM

## 2012-03-21 DIAGNOSIS — K620 Anal polyp: Secondary | ICD-10-CM

## 2012-03-21 DIAGNOSIS — K449 Diaphragmatic hernia without obstruction or gangrene: Secondary | ICD-10-CM | POA: Insufficient documentation

## 2012-03-21 DIAGNOSIS — Z452 Encounter for adjustment and management of vascular access device: Secondary | ICD-10-CM | POA: Insufficient documentation

## 2012-03-21 DIAGNOSIS — K2990 Gastroduodenitis, unspecified, without bleeding: Secondary | ICD-10-CM

## 2012-03-21 DIAGNOSIS — K2970 Gastritis, unspecified, without bleeding: Secondary | ICD-10-CM

## 2012-03-21 HISTORY — PX: ESOPHAGOGASTRODUODENOSCOPY: SHX1529

## 2012-03-21 HISTORY — PX: COLONOSCOPY: SHX174

## 2012-03-21 SURGERY — COLONOSCOPY WITH ESOPHAGOGASTRODUODENOSCOPY (EGD)
Anesthesia: Moderate Sedation

## 2012-03-21 MED ORDER — SODIUM CHLORIDE 0.9 % IJ SOLN
INTRAMUSCULAR | Status: AC
  Administered 2012-03-21: 10 mL
  Filled 2012-03-21: qty 10

## 2012-03-21 MED ORDER — PROMETHAZINE HCL 25 MG/ML IJ SOLN
INTRAMUSCULAR | Status: AC
  Administered 2012-03-21: 12.5 mg via INTRAVENOUS
  Filled 2012-03-21: qty 1

## 2012-03-21 MED ORDER — PROMETHAZINE HCL 25 MG/ML IJ SOLN
12.5000 mg | Freq: Once | INTRAMUSCULAR | Status: AC
  Administered 2012-03-21: 12.5 mg via INTRAVENOUS

## 2012-03-21 MED ORDER — MIDAZOLAM HCL 5 MG/5ML IJ SOLN
INTRAMUSCULAR | Status: DC | PRN
  Administered 2012-03-21 (×3): 2 mg via INTRAVENOUS
  Administered 2012-03-21 (×2): 1 mg via INTRAVENOUS

## 2012-03-21 MED ORDER — STERILE WATER FOR IRRIGATION IR SOLN
Status: DC | PRN
  Administered 2012-03-21: 11:00:00

## 2012-03-21 MED ORDER — PROMETHAZINE HCL 25 MG/ML IJ SOLN
INTRAMUSCULAR | Status: AC
  Filled 2012-03-21: qty 1

## 2012-03-21 MED ORDER — MIDAZOLAM HCL 5 MG/5ML IJ SOLN
INTRAMUSCULAR | Status: AC
  Filled 2012-03-21: qty 10

## 2012-03-21 MED ORDER — PROMETHAZINE HCL 25 MG/ML IJ SOLN
INTRAMUSCULAR | Status: DC | PRN
  Administered 2012-03-21 (×2): 12.5 mg via INTRAVENOUS

## 2012-03-21 MED ORDER — SODIUM CHLORIDE 0.9 % IJ SOLN: 10.0000 mL | INTRAMUSCULAR | Status: DC | PRN

## 2012-03-21 MED ORDER — MEPERIDINE HCL 100 MG/ML IJ SOLN
INTRAMUSCULAR | Status: AC
  Filled 2012-03-21: qty 2

## 2012-03-21 MED ORDER — PROMETHAZINE HCL 25 MG/ML IJ SOLN
INTRAMUSCULAR | Status: AC
  Filled 2012-03-21: qty 9

## 2012-03-21 MED ORDER — SODIUM CHLORIDE 0.9 % IJ SOLN: 10.0000 mL | Freq: Two times a day (BID) | INTRAMUSCULAR | Status: DC

## 2012-03-21 MED ORDER — BUTAMBEN-TETRACAINE-BENZOCAINE 2-2-14 % EX AERO
INHALATION_SPRAY | CUTANEOUS | Status: DC | PRN
  Administered 2012-03-21: 2 via TOPICAL

## 2012-03-21 MED ORDER — MEPERIDINE HCL 100 MG/ML IJ SOLN
INTRAMUSCULAR | Status: DC | PRN
  Administered 2012-03-21: 50 mg via INTRAVENOUS
  Administered 2012-03-21: 25 mg via INTRAVENOUS
  Administered 2012-03-21 (×2): 50 mg via INTRAVENOUS

## 2012-03-21 NOTE — Discharge Instructions (Signed)
YOU HAD FREQUENT FORMED STOOL. I THINK YOUR DIARRHEA ARE MOST LIKEY DUE TO IBS.  You have gastritis/DUODENITIS & a HIATAL HERNIA. YOUR ABDOMINAL PAIN IS MOST LIKELY DUE TO IBS AND GASTRITIS/DUODENITIS.  YOU HAD 2 SMALL POLYPS REMOVED. You have internal hemorrhoids. Your prep was not good in the YOUR colon.  THE LAST PART OF YOUR SMALL BOWEL IS NORMAL.  I biopsied your stomach, SMALL BOWEL, AND COLON.   CONTINUE DEXILANT.   AVOID ITEMS THAT TRIGGER GASTRITIS/DUODENITIS. SEE INFO BELOW.  FOLLOW A HIGH FIBER/LOW FAT DIET. AVOID ITEMS THAT CAUSE BLOATING. SEE INFO BELOW. CONTINUE WEIGHT LOSS EFFORTS.  YOUR BIOPSY RESULTS WILL BE BACK IN 7 DAYS.  FOLLOW UP IN 6 WEEKS.    ENDOSCOPY Care After Read the instructions outlined below and refer to this sheet in the next week. These discharge instructions provide you with general information on caring for yourself after you leave the hospital. While your treatment has been planned according to the most current medical practices available, unavoidable complications occasionally occur. If you have any problems or questions after discharge, call DR. Kristeen Lantz, [phone].  ACTIVITY  You may resume your regular activity, but move at a slower pace for the next 24 hours.   Take frequent rest periods for the next 24 hours.   Walking will help get rid of the air and reduce the bloated feeling in your belly (abdomen).   No driving for 24 hours (because of the medicine (anesthesia) used during the test).   You may shower.   Do not sign any important legal documents or operate any machinery for 24 hours (because of the anesthesia used during the test).    NUTRITION  Drink plenty of fluids.   You may resume your normal diet as instructed by your doctor.   Begin with a light meal and progress to your normal diet. Heavy or fried foods are harder to digest and may make you feel sick to your stomach (nauseated).   Avoid alcoholic beverages for 24  hours or as instructed.    MEDICATIONS  You may resume your normal medications.   WHAT YOU CAN EXPECT TODAY  Some feelings of bloating in the abdomen.   Passage of more gas than usual.   Spotting of blood in your stool or on the toilet paper  .  IF YOU HAD POLYPS REMOVED DURING THE ENDOSCOPY:  Eat a soft diet IF YOU HAVE NAUSEA, BLOATING, ABDOMINAL PAIN, OR VOMITING.    FINDING OUT THE RESULTS OF YOUR TEST Not all test results are available during your visit. DR. Darrick Penna WILL CALL YOU WITHIN 7 DAYS OF YOUR PROCEDUE WITH YOUR RESULTS. Do not assume everything is normal if you have not heard from DR. Jefferie Holston IN ONE WEEK, CALL HER OFFICE AT [phone].  SEEK IMMEDIATE MEDICAL ATTENTION AND CALL THE OFFICE: [phone] IF:  You have more than a spotting of blood in your stool.   Your belly is swollen (abdominal distention).   You are nauseated or vomiting.   You have a temperature over 101F.   You have abdominal pain or discomfort that is severe or gets worse throughout the day.    Gastritis/DUODENITIS  Gastritis is an inflammation (the body's way of reacting to injury and/or infection) of the stomach. DUODENITIS is an inflammation (the body's way of reacting to injury and/or infection) of the FIRST PART OF THE SMALL INTESTINES. It is often caused by bacterial (germ) infections. It can also be caused BY ASPIRIN, BC/GOODY POWDER'S, (IBUPROFEN)  MOTRIN, OR ALEVE (NAPROXEN), chemicals (including alcohol), SPICY FOODS, and medications. This illness may be associated with generalized malaise (feeling tired, not well), UPPER ABDOMINAL STOMACH cramps, and fever. One common bacterial cause of gastritis is an organism known as H. Pylori. This can be treated with antibiotics.    Hiatal Hernia A hiatal hernia occurs when a part of the stomach slides above the diaphragm. The diaphragm is the thin muscle separating the belly (abdomen) from the chest. A hiatal hernia can be something you  are born with or develop over time. Hiatal hernias may allow stomach acid to flow back into your esophagus, the tube which carries food from your mouth to your stomach. If this acid causes problems it is called GERD (gastro-esophageal reflux disease).   SYMPTOMS Common symptoms of GERD are heartburn (burning in your chest). This is worse when lying down or bending over. It may also cause belching and indigestion. Some of the things which make GERD worse are:  Increased weight pushes on stomach making acid rise more easily.   Smoking markedly increases acid production.   Alcohol decreases lower esophageal sphincter pressure (valve between stomach and esophagus), allowing acid from stomach into esophagus.   Late evening meals and going to bed with a full stomach increases pressure.   Anything that causes an increase in acid production.    HOME CARE INSTRUCTIONS  Try to achieve and maintain an ideal body weight.   Avoid drinking alcoholic beverages.   DO NOT smokE.   Do not wear tight clothing around your chest or stomach.   Eat smaller meals and eat more frequently. This keeps your stomach from getting too full. Eat slowly.   Do not lie down for 2 or 3 hours after eating. Do not eat or drink anything 1 to 2 hours before going to bed.   Avoid caffeine beverages (colas, coffee, cocoa, tea), fatty foods, citrus fruits and all other foods and drinks that contain acid and that seem to increase the problems.   Avoid bending over, especially after eating OR STRAINING. Anything that increases the pressure in your belly increases the amount of acid that may be pushed up into your esophagus.    High-Fiber Diet A high-fiber diet changes your normal diet to include more whole grains, legumes, fruits, and vegetables. Changes in the diet involve replacing refined carbohydrates with unrefined foods. The calorie level of the diet is essentially unchanged. The Dietary Reference Intake (recommended  amount) for adult males is 38 grams per day. For adult females, it is 25 grams per day. Pregnant and lactating women should consume 28 grams of fiber per day. Fiber is the intact part of a plant that is not broken down during digestion. Functional fiber is fiber that has been isolated from the plant to provide a beneficial effect in the body. PURPOSE  Increase stool bulk.   Ease and regulate bowel movements.   Lower cholesterol.  INDICATIONS THAT YOU NEED MORE FIBER  Constipation and hemorrhoids.   Uncomplicated diverticulosis (intestine condition) and irritable bowel syndrome.   Weight management.   As a protective measure against hardening of the arteries (atherosclerosis), diabetes, and cancer.   GUIDELINES FOR INCREASING FIBER IN THE DIET  Start adding fiber to the diet slowly. A gradual increase of about 5 more grams (2 slices of whole-wheat bread, 2 servings of most fruits or vegetables, or 1 bowl of high-fiber cereal) per day is best. Too rapid an increase in fiber may result in constipation, flatulence,  and bloating.   Drink enough water and fluids to keep your urine clear or pale yellow. Water, juice, or caffeine-free drinks are recommended. Not drinking enough fluid may cause constipation.   Eat a variety of high-fiber foods rather than one type of fiber.   Try to increase your intake of fiber through using high-fiber foods rather than fiber pills or supplements that contain small amounts of fiber.   The goal is to change the types of food eaten. Do not supplement your present diet with high-fiber foods, but replace foods in your present diet.  INCLUDE A VARIETY OF FIBER SOURCES  Replace refined and processed grains with whole grains, canned fruits with fresh fruits, and incorporate other fiber sources. White rice, white breads, and most bakery goods contain little or no fiber.   Brown whole-grain rice, buckwheat oats, and many fruits and vegetables are all good sources of  fiber. These include: broccoli, Brussels sprouts, cabbage, cauliflower, beets, sweet potatoes, white potatoes (skin on), carrots, tomatoes, eggplant, squash, berries, fresh fruits, and dried fruits.   Cereals appear to be the richest source of fiber. Cereal fiber is found in whole grains and bran. Bran is the fiber-rich outer coat of cereal grain, which is largely removed in refining. In whole-grain cereals, the bran remains. In breakfast cereals, the largest amount of fiber is found in those with "bran" in their names. The fiber content is sometimes indicated on the label.   You may need to include additional fruits and vegetables each day.   In baking, for 1 cup white flour, you may use the following substitutions:   1 cup whole-wheat flour minus 2 tablespoons.   1/2 cup white flour plus 1/2 cup whole-wheat flour.   Low-Fat Diet BREADS, CEREALS, PASTA, RICE, DRIED PEAS, AND BEANS These products are high in carbohydrates and most are low in fat. Therefore, they can be increased in the diet as substitutes for fatty foods. They too, however, contain calories and should not be eaten in excess. Cereals can be eaten for snacks as well as for breakfast.  Include foods that contain fiber (fruits, vegetables, whole grains, and legumes). Research shows that fiber may lower blood cholesterol levels, especially the water-soluble fiber found in fruits, vegetables, oat products, and legumes. FRUITS AND VEGETABLES It is good to eat fruits and vegetables. Besides being sources of fiber, both are rich in vitamins and some minerals. They help you get the daily allowances of these nutrients. Fruits and vegetables can be used for snacks and desserts. MEATS Limit lean meat, chicken, [place], and fish to no more than 6 ounces per day. Beef, Pork, and Lamb Use lean cuts of beef, pork, and lamb. Lean cuts include:  Extra-lean ground beef.  Arm roast.  Sirloin tip.  Center-cut ham.  Round steak.  Loin chops.    Rump roast.  Tenderloin.  Trim all fat off the outside of meats before cooking. It is not necessary to severely decrease the intake of red meat, but lean choices should be made. Lean meat is rich in protein and contains a highly absorbable form of iron. Premenopausal women, in particular, should avoid reducing lean red meat because this could increase the risk for low red blood cells (iron-deficiency anemia). The organ meats, such as liver, sweetbreads, kidneys, and brain are very rich in cholesterol. They should be limited. Chicken and [place] These are good sources of protein. The fat of poultry can be reduced by removing the skin and underlying fat layers before cooking.  Chicken and [place] can be substituted for lean red meat in the diet. Poultry should not be fried or covered with high-fat sauces. Fish and Shellfish Fish is a good source of protein. Shellfish contain cholesterol, but they usually are low in saturated fatty acids. The preparation of fish is important. Like chicken and [place], they should not be fried or covered with high-fat sauces. EGGS Egg whites contain no fat or cholesterol. They can be eaten often. Try 1 to 2 egg whites instead of whole eggs in recipes or use egg substitutes that do not contain yolk. MILK AND DAIRY PRODUCTS Use skim or 1% milk instead of 2% or whole milk. Decrease whole milk, natural, and processed cheeses. Use nonfat or low-fat (2%) cottage cheese or low-fat cheeses made from vegetable oils. Choose nonfat or low-fat (1 to 2%) yogurt. Experiment with evaporated skim milk in recipes that call for heavy cream. Substitute low-fat yogurt or low-fat cottage cheese for sour cream in dips and salad dressings. Have at least 2 servings of low-fat dairy products, such as 2 glasses of skim (or 1%) milk each day to help get your daily calcium intake.  FATS AND OILS Reduce the total intake of fats, especially saturated fat. Butterfat, lard, and beef fats are high in  saturated fat and cholesterol. These should be avoided as much as possible. Vegetable fats do not contain cholesterol, but certain vegetable fats, such as coconut oil, palm oil, and palm kernel oil are very high in saturated fats. These should be limited. These fats are often used in bakery goods, processed foods, popcorn, oils, and nondairy creamers. Vegetable shortenings and some peanut butters contain hydrogenated oils, which are also saturated fats. Read the labels on these foods and check for saturated vegetable oils. Unsaturated vegetable oils and fats do not raise blood cholesterol. However, they should be limited because they are fats and are high in calories. Total fat should still be limited to 30% of your daily caloric intake. Desirable liquid vegetable oils are corn oil, cottonseed oil, olive oil, canola oil, safflower oil, soybean oil, and sunflower oil. Peanut oil is not as good, but small amounts are acceptable. Buy a heart-healthy tub margarine that has no partially hydrogenated oils in the ingredients. Mayonnaise and salad dressings often are made from unsaturated fats, but they should also be limited because of their high calorie and fat content. Seeds, nuts, peanut butter, olives, and avocados are high in fat, but the fat is mainly the unsaturated type. These foods should be limited mainly to avoid excess calories and fat. OTHER EATING TIPS Snacks  Most sweets should be limited as snacks. They tend to be rich in calories and fats, and their caloric content outweighs their nutritional value. Some good choices in snacks are graham crackers, melba toast, soda crackers, bagels (no egg), English muffins, fruits, and vegetables. These snacks are preferable to snack crackers, [place] fries, and chips. Popcorn should be air-popped or cooked in small amounts of liquid vegetable oil. Desserts Eat fruit, low-fat yogurt, and fruit ices. AVOID pastries, cake, and cookies. Sherbet, angel food cake,  gelatin dessert, frozen low-fat yogurt, or other frozen products that do not contain saturated fat (pure fruit juice bars, frozen ice pops) are also acceptable.  COOKING METHODS Choose those methods that use little or no fat. They include: Poaching.  Braising.  Steaming.  Grilling.  Baking.  Stir-frying.  Broiling.  Microwaving.  Foods can be cooked in a nonstick pan without added fat, or use a nonfat  cooking spray in regular cookware. Limit fried foods and avoid frying in saturated fat. Add moisture to lean meats by using water, broth, cooking wines, and other nonfat or low-fat sauces along with the cooking methods mentioned above. Soups and stews should be chilled after cooking. The fat that forms on top after a few hours in the refrigerator should be skimmed off. When preparing meals, avoid using excess salt. Salt can contribute to raising blood pressure in some people. EATING AWAY FROM HOME Order entres, potatoes, and vegetables without sauces or butter. When meat exceeds the size of a deck of cards (3 to 4 ounces), the rest can be taken home for another meal. Choose vegetable or fruit salads and ask for low-calorie salad dressings to be served on the side. Use dressings sparingly. Limit high-fat toppings, such as bacon, crumbled eggs, cheese, sunflower seeds, and olives. Ask for heart-healthy tub margarine instead of butter.  Hemorrhoids Hemorrhoids are dilated (enlarged) veins around the rectum. Sometimes clots will form in the veins. This makes them swollen and painful. These are called thrombosed hemorrhoids. Causes of hemorrhoids include:  Constipation.   Straining to have a bowel movement.   HEAVY LIFTING HOME CARE INSTRUCTIONS  Eat a well balanced diet and drink 6 to 8 glasses of water every day to avoid constipation. You may also use a bulk laxative.   Avoid straining to have bowel movements.   Keep anal area dry and clean.   Do not use a donut shaped pillow or sit on  the toilet for long periods. This increases blood pooling and pain.   Move your bowels when your body has the urge; this will require less straining and will decrease pain and pressure.

## 2012-03-21 NOTE — Progress Notes (Signed)
Several attempts made to insert standard peripheral iv without  success  Either not able to insert or infiltration. Order for picc line insertion.

## 2012-03-21 NOTE — Op Note (Signed)
[facility] [address] [place], [place]  [address]  COLONOSCOPY PROCEDURE REPORT  PATIENT:  Chelsea, Lewis  MR#:  [id number] BIRTHDATE:  May 07, 1961, 50 yrs. old  GENDER:  female  ENDOSCOPIST:  Jonette Eva, MD REF. BY:  Elby Showers, M.D. ASSISTANT:  PROCEDURE DATE:  03/21/2012 PROCEDURE:  IEOColonoscopy with biopsy and snare polypectomy  INDICATIONS:  DIARRHEA/ABDOMINAL PAIN  MEDICATIONS:   Demerol 100 mg IV, Versed 4 mg IV, promethazine (Phenergan) 12.5 mg IV  DESCRIPTION OF PROCEDURE:    Physical exam was performed. Informed consent was obtained from the patient after explaining the benefits, risks, and alternatives to procedure.  The patient was connected to monitor and placed in left lateral position. Continuous oxygen was provided by nasal cannula and IV medicine administered through an indwelling cannula.  After administration of sedation and rectal exam, the patient's rectum was intubated and the EC-[id number] ([id number]) colonoscope was advanced under direct visualization to the ILEUM The scope was removed slowly by carefully examining the color, texture, anatomy, and integrity mucosa on the way out.  The patient was recovered in endoscopy and discharged home in satisfactory condition. <<PROCEDUREIMAGES>>  FINDINGS:  A 3 MM sessile polyp was found in the transverse colon & REMOVED VIA COLDFORCEPS.  A 6 MM SESSILE polyp was found in the rectum & REMOVED VIA SNARE CAUTERY.  SMALL Internal Hemorrhoids were found. NO DIVERTICULA. NL ILEUM   10 CM VISUALIZED. RANDOM BIOPSIES OBTAINED TO EVALUATE FOR MICROSCOPIC COLITIS.  PREP QUALITY: FAIR-POLYPS LESS THAN 1 CM WOULD HAVE BEEN MISSED CECAL W/D TIME:    15 minutes  COMPLICATIONS:    None  ENDOSCOPIC IMPRESSION: 1) Sessile polyp in the transverse colon 2) Sessile polyp in the rectum 3) Internal hemorrhoids 4)INCOMLETE TCS FORMED STOOL/LARGE AMOUNT OF LIQUID IN COLON WHICH COULD NOT BE ASPIRATED  5)  ABDOMINAL PAIN/DIARRHEA MOST LIKELY DUE TO IBS-MIXED  RECOMMENDATIONS: AWAIT BIOPSIES. IF SIMPLE ADENOMAS, PT NEED REPEAT TCS W/I 1-2 MOS. HIGH FIBER/LOW FAT DIET OPV IN 6 WEEKS  REPEAT EXAM:  No  ______________________________ Jonette Eva, MD  CC:  Elby Showers, M.D.  n. eSIGNED:   Sarissa Dern at 03/21/2012 02:43 PM  Loretha Brasil, [phone]

## 2012-03-21 NOTE — H&P (Signed)
Primary Care Physician:  Elby Showers, MD Primary Gastroenterologist:  Dr. Darrick Penna  Pre-Procedure History & Physical: HPI:  [name] is a 51 y.o. female here for DIARRHEA.ABDOMINAL PAIN.  Past Medical History  Diagnosis Date  . S/P colonoscopy 06/02/09    normal (Dr. Linna Darner)  . PUD (peptic ulcer disease) 01/2007    EGD Dr Jena Gauss, 2 antral ulcers, negative h pylori  . MRSA infection     Dr. Lenice Pressman, currently under treatment  . Hiatal hernia 2008    on EGD above  . Asthma   . PTSD (post-traumatic stress disorder)   . Migraines   . Degenerative disc disease   . Rectal prolapse   . SBO (small bowel obstruction)   . ADHD (attention deficit hyperactivity disorder)   . S/P endoscopy 03/26/11    gastritis-Dr Darrick Penna  . Clostridium difficile colitis 04/10/11 &11/2011    tx w/ flagyl  . Kidney stones   . Chronic abdominal pain 2003    EX LAP APR 2004 RUPTURE L OV CYST, JUL 2004 ADHESIONS  . Irritable bowel syndrome 2004 CONSTIPATION  . BMI (body mass index) 20.0-29.9 2009 127 LBS    Past Surgical History  Procedure Date  . Bowel resection     x2, secondary to adhesions  . Appendectomy   . Laparoscopic lysis intestinal adhesions   . Back surgery   . Abdominal hysterectomy   . Tubal ligation   . Umbilical hernia repair 2008    x5  . Neck surgery 2009  . Partial hysterectomy   . Upper gastrointestinal endoscopy APR 2008 [facility] BLEEDING/PAIN    PUD  . Upper gastrointestinal endoscopy JUL 2012 [facility] PAIN    MILD GASTRITIS  . Esophagogastroduodenoscopy      Normal esophagus without evidence of Barrett's, mass, erosions, or ulcerations./ Patchy erythema with occasional erosion in the antrum.  Biopsies  obtained via cold forceps to evaluate for H. pylori gastritis/ Small hiatal hernia./Normal duodenal bulb and second portion of the duodenum.    Prior to Admission medications   Medication Sig Start Date End Date Taking? Authorizing Provider  albuterol  (PROVENTIL,VENTOLIN) 90 MCG/ACT inhaler Inhale 2 puffs into the lungs every 6 (six) hours as needed. For shortness of breath   Yes Historical Provider, MD  ALPRAZolam Prudy Feeler) 1 MG tablet Take 1 mg by mouth 4 (four) times daily as needed. Anxiety   Yes Historical Provider, MD  amphetamine-dextroamphetamine (ADDERALL XR) 15 MG 24 hr capsule Take 15 mg by mouth 2 (two) times daily.     Yes Historical Provider, MD  bismuth subsalicylate (PEPTO BISMOL) 262 MG/15ML suspension Take 15 mLs by mouth daily as needed. Diarrhea   Yes Historical Provider, MD  dexlansoprazole (DEXILANT) 60 MG capsule Take 60 mg by mouth every morning.    Yes Historical Provider, MD  DULoxetine (CYMBALTA) 60 MG capsule Take 120 mg by mouth every morning.    Yes Historical Provider, MD  famotidine (PEPCID) 20 MG tablet Take 20 mg by mouth 3 (three) times daily.   Yes Historical Provider, MD  gabapentin (NEURONTIN) 300 MG capsule Take 600-900 mg by mouth 2 (two) times daily. Takes 600 mg every morning and 900 mg in the evening   Yes Historical Provider, MD  Lactobacillus (ACIDOPHILUS) CAPS Take 2 capsules by mouth daily.    Yes Historical Provider, MD  lidocaine (LIDODERM) 5 % Place 1 patch onto the skin daily as needed. Uses for pain in back. Not used regularly.   Yes Historical Provider, MD  Multiple Vitamin (MULTIVITAMIN WITH MINERALS) TABS Take 1 tablet by mouth daily.   Yes Historical Provider, MD  promethazine (PHENERGAN) 12.5 MG tablet Take 1 tablet (12.5 mg total) by mouth every 6 (six) hours as needed for nausea. 03/14/12 03/21/12 Yes [place] Bali, MD  Sodium Chloride-Sodium Bicarb (SINUS WASH NETI POT NA) Place into the nose as needed. For sinus congestion   Yes Historical Provider, MD  topiramate (TOPAMAX) 25 MG tablet Take 25 mg by mouth 3 (three) times daily.    Yes Historical Provider, MD  zolpidem (AMBIEN CR) 12.5 MG CR tablet Take 12.5 mg by mouth at bedtime as needed. For sleep 03/11/12  Yes Historical Provider, MD    HYDROcodone-acetaminophen (NORCO) 5-325 MG per tablet Take 1-2 tablets by mouth every 4 (four) hours as needed. Take for severe pain 03/16/12 03/26/12  Tammy L. Triplett, PA  montelukast (SINGULAIR) 10 MG tablet Take 10 mg by mouth daily.  01/14/12   Historical Provider, MD  traMADol (ULTRAM) 50 MG tablet Take 50 mg by mouth every 8 (eight) hours as needed. For pain 02/29/12   Historical Provider, MD    Allergies as of 03/20/2012 - Review Complete 03/20/2012  Allergen Reaction Noted  . Penicillins Anaphylaxis   . Sulfa antibiotics Itching 05/24/2011  . Aspirin Other (See Comments)   . Ibuprofen Nausea Only 10/08/2011  . Lactaid (lactase) Diarrhea and Other (See Comments) 02/06/2012  . Morphine and related Itching 07/15/2011    Family History  Problem Relation Age of Onset  . Adopted: Yes    History   Social History  . Marital Status: Divorced    Spouse Name: N/A    Number of Children: 1  . Years of Education: N/A   Occupational History  . disabled    Social History Main Topics  . Smoking status: Current Everyday Smoker -- 0.5 packs/day for 1 years    Types: Cigarettes  . Smokeless tobacco: Never Used  . Alcohol Use: No  . Drug Use: No  . Sexually Active: No   Other Topics Concern  . Not on file   Social History Narrative  . No narrative on file    Review of Systems: See HPI, otherwise negative ROS   Physical Exam: BP 101/69  Pulse 65  Temp 97.9 F (36.6 C) (Oral)  Resp 19  SpO2 95% General:   Alert,  pleasant and cooperative in NAD Head:  Normocephalic and atraumatic. Neck:  Supple; . Lungs:  Clear throughout to auscultation.    Heart:  Regular rate and rhythm. Abdomen:  Soft, nontender and nondistended. Normal bowel sounds, without guarding, and without rebound.   Neurologic:  Alert and  oriented x4;  grossly normal neurologically.  Impression/Plan:     DIARRHEA/ABDOMINAL PAIN  PLAN: TCS/EGD TODAY WITH BIOPSY

## 2012-03-22 NOTE — ED Provider Notes (Signed)
Medical screening examination/treatment/procedure(s) were conducted as a shared visit with non-physician practitioner(s) and myself.  I personally evaluated the patient during the encounter.  No acute abd.  Has f/u tomorrow  Donnetta Hutching, MD 03/22/12 [phone]

## 2012-03-24 NOTE — Op Note (Signed)
[facility] [address] [place], [place]  [address]  ENDOSCOPY PROCEDURE REPORT  PATIENT:  Dollie, Bressi  MR#:  [id number] BIRTHDATE:  [birth year], 50 yrs. old  GENDER:  female  ENDOSCOPIST:  Jonette Eva, MD Referred by:  Elby Showers, M.D.  PROCEDURE DATE:  03/21/2012 PROCEDURE:  EGD with biopsy, 43239 ASA CLASS: INDICATIONS:  abdominal pain, diarrhea  MEDICATIONS:   TCS + Demerol 25 mg IV, Versed 2 mg IV TOPICAL ANESTHETIC:  Cetacaine Spray  DESCRIPTION OF PROCEDURE:     Physical exam was performed. Informed consent was obtained from the patient after explaining the benefits, risks, and alternatives to the procedure.  The patient was connected to the monitor and placed in the left lateral position.  Continuous oxygen was provided by nasal cannula and IV medicine administered through an indwelling cannula.  After administration of sedation, the patient's esophagus was intubated and the EC-[id number] ([id number]) and EG-[id number] ([id number]) endoscope was advanced under direct visualization to the second portion of the duodenum.  The scope was removed slowly by carefully examining the color, texture, anatomy, and integrity of the mucosa on the way out.  The patient was recovered in endoscopy and discharged home in satisfactory condition. <<PROCEDUREIMAGES>>  NL ESOPHAGUS. A 2-3 CM hiatal hernia was found.  Mild gastritis was found & BIOPSIED VIA COLD FORCEPS.  MILD Duodenitis was found. BIOPSIES OBTAINED VIA COLD FORCEPS TO EVALUATE FOR CELIAC SPRUE.  COMPLICATIONS:    None  ENDOSCOPIC IMPRESSION: 1) SMALL Hiatal hernia 2) ABDOMINALPAIN/DIARRHEA MOST LIKELY DUE TO IBS, GASTRITIS, DUODENITIS  RECOMMENDATIONS: CONTINUE DEXILANT AWAIT BIOPSY LOW FAT/HIGH FIBER DIET OPV IN 6 WEEKS  REPEAT EXAM:  No  ______________________________ Jonette Eva, MD  CC:  n. eSIGNED:   Shellie Rogoff at 03/21/2012 02:55 PM  Loretha Brasil, [phone]

## 2012-03-27 ENCOUNTER — Telehealth: Payer: Self-pay | Admitting: Gastroenterology

## 2012-03-27 NOTE — Telephone Encounter (Signed)
PLEASE CALL PT. THERE ARE NO ALTERNATIVES. SHE NEEDS TO FOLLOW A LOW FAT DIET & LOSE WEIGHT.

## 2012-03-27 NOTE — Telephone Encounter (Signed)
Called pt and informed of the results. She said to let Dr. Darrick Penna know that the Dexilant is not working very well. She takes Pepcid in addition. But she would like to have something a little stronger than Dexilant so that she doesn't have to buy the Pepcid, because she cannot afford it. Please advise!

## 2012-03-27 NOTE — Telephone Encounter (Signed)
Pt is aware of OV on 7/25 at 10 with SF in E30

## 2012-03-27 NOTE — Telephone Encounter (Signed)
Please call pt. Her colon and small bowel biopsies are normal. HER INTERMITTENT DIARRHEA IS DUE TO IBS. HER STOMACH BIOPSIES SHOW GASTRITIS. SHE DOES NOT HAVE H PYLORI    CONTINUE DEXILANT.   AVOID ITEMS THAT TRIGGER GASTRITIS/DUODENITIS.   FOLLOW A HIGH FIBER/LOW FAT DIET. AVOID ITEMS THAT CAUSE BLOATING.   CONTINUE WEIGHT LOSS EFFORTS.  FOLLOW UP IN 6 WEEKS E 30.

## 2012-03-27 NOTE — Telephone Encounter (Signed)
LMOM to call.

## 2012-03-28 ENCOUNTER — Other Ambulatory Visit: Payer: Self-pay | Admitting: Gastroenterology

## 2012-03-28 NOTE — Telephone Encounter (Signed)
Pt returned call and was informed.  

## 2012-03-28 NOTE — Telephone Encounter (Signed)
LMOM to call.

## 2012-03-31 NOTE — Telephone Encounter (Signed)
Results Cc to PCP  

## 2012-04-07 ENCOUNTER — Emergency Department (HOSPITAL_COMMUNITY)
Admission: EM | Admit: 2012-04-07 | Discharge: 2012-04-07 | Disposition: A | Discharge status: Home / Self Care | Payer: PRIVATE HEALTH INSURANCE | Attending: Emergency Medicine | Admitting: Emergency Medicine

## 2012-04-07 ENCOUNTER — Encounter (HOSPITAL_COMMUNITY): Payer: Self-pay | Admitting: *Deleted

## 2012-04-07 DIAGNOSIS — Z79899 Other long term (current) drug therapy: Secondary | ICD-10-CM | POA: Insufficient documentation

## 2012-04-07 DIAGNOSIS — F17200 Nicotine dependence, unspecified, uncomplicated: Secondary | ICD-10-CM | POA: Insufficient documentation

## 2012-04-07 DIAGNOSIS — IMO0002 Reserved for concepts with insufficient information to code with codable children: Secondary | ICD-10-CM | POA: Insufficient documentation

## 2012-04-07 DIAGNOSIS — J45909 Unspecified asthma, uncomplicated: Secondary | ICD-10-CM | POA: Insufficient documentation

## 2012-04-07 DIAGNOSIS — R109 Unspecified abdominal pain: Secondary | ICD-10-CM | POA: Insufficient documentation

## 2012-04-07 DIAGNOSIS — G43909 Migraine, unspecified, not intractable, without status migrainosus: Secondary | ICD-10-CM

## 2012-04-07 DIAGNOSIS — F909 Attention-deficit hyperactivity disorder, unspecified type: Secondary | ICD-10-CM | POA: Insufficient documentation

## 2012-04-07 DIAGNOSIS — F4310 Post-traumatic stress disorder, unspecified: Secondary | ICD-10-CM | POA: Insufficient documentation

## 2012-04-07 MED ORDER — PROMETHAZINE HCL 12.5 MG PO TABS
25.0000 mg | ORAL_TABLET | Freq: Once | ORAL | Status: AC
  Administered 2012-04-07: 25 mg via ORAL
  Filled 2012-04-07: qty 2

## 2012-04-07 MED ORDER — HYDROCODONE-ACETAMINOPHEN 5-325 MG PO TABS
2.0000 | ORAL_TABLET | Freq: Once | ORAL | Status: AC
  Administered 2012-04-07: 2 via ORAL
  Filled 2012-04-07: qty 2

## 2012-04-07 MED ORDER — HYDROCODONE-ACETAMINOPHEN 7.5-325 MG PO TABS: 1.0000 | ORAL_TABLET | ORAL | Status: AC | PRN

## 2012-04-07 NOTE — ED Notes (Signed)
"  I have a migraine" for 3 days, nausea, no vomiting.  No head injury

## 2012-04-07 NOTE — ED Notes (Signed)
Patient with no complaints at this time. Respirations even and unlabored. Skin warm/dry. Discharge instructions reviewed with patient at this time. Patient given opportunity to voice concerns/ask questions. Patient discharged at this time and left Emergency Department with steady gait.   

## 2012-04-07 NOTE — Discharge Instructions (Signed)
Migraine Headache  A migraine is very bad pain on one or both sides of your head. The cause of a migraine is not always known. A migraine can be triggered or caused by different things, such as:   Alcohol.   Smoking.   Stress.   Periods (menstruation) in women.   Aged cheeses.   Foods or drinks that contain nitrates, glutamate, aspartame, or tyramine.   Lack of sleep.   Chocolate.   Caffeine.   Hunger.   Medicines, such as nitroglycerine (used to treat chest pain), birth control pills, estrogen, and some blood pressure medicines.  HOME CARE   Many medicines can help migraine pain or keep migraines from coming back. Your doctor can help you decide on a medicine or treatment program.   If you or your child gets a migraine, it may help to lie down in a dark, quiet room.   Keep a headache journal. This may help find out what is causing the headaches. For example, write down:   What you eat and drink.   How much sleep you get.   Any change to your diet or medicines.  GET HELP RIGHT AWAY IF:    The medicine does not work.   The pain begins again.   The neck is stiff.   You have trouble seeing.   The muscles are weak or you lose muscle control.   You have new symptoms.   You lose your balance.   You have trouble walking.   You feel faint or pass out.  MAKE SURE YOU:    Understand these instructions.   Will watch this condition.   Will get help right away if you are not doing well or get worse.  Document Released: 07/03/2008 Document Revised: 09/13/2011 Document Reviewed: 05/30/2009  [employer] Patient Information 2012 [employer].

## 2012-04-07 NOTE — ED Notes (Addendum)
Pt. Began w/migraine 3d ago.  Takes Topamax, Baclofen and Phenergan w/out relief.

## 2012-04-07 NOTE — ED Provider Notes (Signed)
History     CSN: [id number]  Arrival date & time 04/07/12  1225   First MD Initiated Contact with Patient 04/07/12 1432      Chief Complaint  Patient presents with  . Migraine    (Consider location/radiation/quality/duration/timing/severity/associated sxs/prior treatment) HPI Comments: Patient states she has a history of migraine headaches. These have been improved since the use of some of her headache maintenance medications. However 3 days ago she developed a headache that started in the back of her neck and went to the temples and into the left eye. She's had problems getting rid of this with her conventional medications, and presents to the emergency room for additional evaluation. She's had problems with nausea, but no vomiting. His been no loss of consciousness. There's been no change in speech. There's been no unusual weakness.  Patient is a 51 y.o. female presenting with migraine. The history is provided by the patient.  Migraine Associated symptoms include abdominal pain and headaches. Pertinent negatives include no arthralgias, chest pain, coughing or neck pain.    Past Medical History  Diagnosis Date  . S/P colonoscopy 06/02/09    normal (Dr. Linna Darner)  . PUD (peptic ulcer disease) 01/2007    EGD Dr Jena Gauss, 2 antral ulcers, negative h pylori  . MRSA infection     Dr. Lenice Pressman, currently under treatment  . Hiatal hernia 2008    on EGD above  . Asthma   . PTSD (post-traumatic stress disorder)   . Migraines   . Degenerative disc disease   . Rectal prolapse   . SBO (small bowel obstruction)   . ADHD (attention deficit hyperactivity disorder)   . S/P endoscopy 03/26/11    gastritis-Dr Darrick Penna  . Clostridium difficile colitis 04/10/11 &11/2011    tx w/ flagyl  . Kidney stones   . Chronic abdominal pain 2003    EX LAP APR 2004 RUPTURE L OV CYST, JUL 2004 ADHESIONS  . Irritable bowel syndrome 2004 CONSTIPATION  . BMI (body mass index) 20.0-29.9 2009 127 LBS    Past  Surgical History  Procedure Date  . Bowel resection     x2, secondary to adhesions  . Appendectomy   . Laparoscopic lysis intestinal adhesions   . Back surgery   . Abdominal hysterectomy   . Tubal ligation   . Umbilical hernia repair 2008    x5  . Neck surgery 2009  . Partial hysterectomy   . Upper gastrointestinal endoscopy APR 2008 [facility] BLEEDING/PAIN    PUD  . Upper gastrointestinal endoscopy JUL 2012 [facility] PAIN    MILD GASTRITIS  . Esophagogastroduodenoscopy      Normal esophagus without evidence of Barrett's, mass, erosions, or ulcerations./ Patchy erythema with occasional erosion in the antrum.  Biopsies  obtained via cold forceps to evaluate for H. pylori gastritis/ Small hiatal hernia./Normal duodenal bulb and second portion of the duodenum.  . Cervical spine surgery     Family History  Problem Relation Age of Onset  . Adopted: Yes    History  Substance Use Topics  . Smoking status: Current Everyday Smoker -- 0.5 packs/day for 1 years    Types: Cigarettes  . Smokeless tobacco: Never Used  . Alcohol Use: No    OB History    Grav Para Term Preterm Abortions TAB SAB Ect Mult Living   1 1 1       1       Review of Systems  Constitutional: Negative for activity change.  All ROS Neg except as noted in HPI  HENT: Negative for nosebleeds and neck pain.   Eyes: Negative for photophobia and discharge.  Respiratory: Negative for cough, shortness of breath and wheezing.   Cardiovascular: Negative for chest pain and palpitations.  Gastrointestinal: Positive for abdominal pain. Negative for blood in stool.  Genitourinary: Negative for dysuria, frequency and hematuria.  Musculoskeletal: Negative for back pain and arthralgias.  Skin: Negative.   Neurological: Positive for headaches. Negative for dizziness, seizures and speech difficulty.  Psychiatric/Behavioral: Negative for hallucinations and confusion.    Allergies  Penicillins; Sulfa antibiotics; Aspirin;  Ibuprofen; Lactaid; and Morphine and related  Home Medications   Current Outpatient Rx  Name Route Sig Dispense Refill  . ALBUTEROL 90 MCG/ACT IN AERS Inhalation Inhale 2 puffs into the lungs every 6 (six) hours as needed. For shortness of breath    . ALPRAZOLAM 1 MG PO TABS Oral Take 1 mg by mouth 4 (four) times daily as needed. Anxiety    . AMPHETAMINE-DEXTROAMPHET ER 15 MG PO CP24 Oral Take 15 mg by mouth 2 (two) times daily.      Marland Kitchen BISMUTH SUBSALICYLATE 262 MG/15ML PO SUSP Oral Take 15 mLs by mouth daily as needed. Diarrhea    . DEXLANSOPRAZOLE 60 MG PO CPDR Oral Take 60 mg by mouth every morning.     . DULOXETINE HCL 60 MG PO CPEP Oral Take 120 mg by mouth every morning.     Marland Kitchen FAMOTIDINE 20 MG PO TABS Oral Take 20 mg by mouth 3 (three) times daily.    Marland Kitchen GABAPENTIN 300 MG PO CAPS Oral Take 600-900 mg by mouth 2 (two) times daily. Takes 600 mg every morning and 900 mg in the evening    . ACIDOPHILUS PO CAPS Oral Take 2 capsules by mouth daily.     Marland Kitchen LIDOCAINE 5 % EX PTCH Transdermal Place 1 patch onto the skin daily as needed. Uses for pain in back. Not used regularly.    Marland Kitchen MONTELUKAST SODIUM 10 MG PO TABS Oral Take 10 mg by mouth daily.     . ADULT MULTIVITAMIN W/MINERALS CH Oral Take 1 tablet by mouth daily.    Marland Kitchen PHENERGAN 12.5 MG PO TABS  TAKE ONE TABLET BY MOUTH EVERY 6 HOURS AS NEEDED. 60 each 0  . SINUS WASH NETI POT NA Nasal Place into the nose as needed. For sinus congestion    . TOPIRAMATE 25 MG PO TABS Oral Take 25 mg by mouth 3 (three) times daily.     . TRAMADOL HCL 50 MG PO TABS Oral Take 50 mg by mouth every 8 (eight) hours as needed. For pain    . ZOLPIDEM TARTRATE ER 12.5 MG PO TBCR Oral Take 12.5 mg by mouth at bedtime as needed. For sleep      BP 105/63  Pulse 91  Temp 98.4 F (36.9 C) (Oral)  Resp 20  Ht 5\' 5"  (1.651 m)  Wt 157 lb (71.215 kg)  BMI 26.13 kg/m2  SpO2 98%  Physical Exam  Nursing note and vitals reviewed. Constitutional: She is oriented to  person, place, and time. She appears well-developed and well-nourished.  Non-toxic appearance.  HENT:  Head: Normocephalic.  Right Ear: Tympanic membrane and external ear normal.  Left Ear: Tympanic membrane and external ear normal.  Eyes: EOM and lids are normal. Pupils are equal, round, and reactive to light.  Neck: Normal range of motion. Neck supple. Carotid bruit is not present.  Cardiovascular: Normal rate,  regular rhythm, normal heart sounds, intact distal pulses and normal pulses.   Pulmonary/Chest: Breath sounds normal. No respiratory distress.  Abdominal: Soft. Bowel sounds are normal. There is no tenderness. There is no guarding.  Musculoskeletal: Normal range of motion.  Lymphadenopathy:       Head (right side): No submandibular adenopathy present.       Head (left side): No submandibular adenopathy present.    She has no cervical adenopathy.  Neurological: She is alert and oriented to person, place, and time. She has normal strength. No cranial nerve deficit or sensory deficit. She exhibits normal muscle tone. Coordination normal.  Skin: Skin is warm and dry.  Psychiatric: She has a normal mood and affect. Her speech is normal.    ED Course  Procedures (including critical care time)  Labs Reviewed - No data to display No results found.   No diagnosis found.    MDM  I have reviewed nursing notes, vital signs, and all appropriate lab and imaging results for this patient. Patient has history of migraine headaches. This headache is similar to previous migraines. Patient is treated with Phenergan and Lortab. Patient is advised to see her primary physician for additional evaluation.       Kathie Dike, [place] 04/07/12 1446

## 2012-04-08 ENCOUNTER — Encounter: Payer: Self-pay | Admitting: Gastroenterology

## 2012-04-08 NOTE — ED Provider Notes (Signed)
Medical screening examination/treatment/procedure(s) were performed by non-physician practitioner and as supervising physician I was immediately available for consultation/collaboration.  Amal Renbarger, MD 04/08/12 0846 

## 2012-04-28 ENCOUNTER — Telehealth: Payer: Self-pay | Admitting: *Deleted

## 2012-04-28 NOTE — Telephone Encounter (Signed)
I called Pt she has not had a BM in a week. She does have leaking from her rectal area and it is messing up her clothes. I tried to give her an appointment but she wanted to see what [facility] said first. Please advise.

## 2012-04-28 NOTE — Telephone Encounter (Signed)
She would like to know if we can call in her something for nausea. Please advise

## 2012-04-28 NOTE — Telephone Encounter (Signed)
Pt aware of what to do

## 2012-04-28 NOTE — Telephone Encounter (Signed)
PLEASE CALL PT.   PT SHOULD USE MIRALAX TID FOR 3 DAYS & DULCOLAX 10 MG DAILY FOR 3 DAYS. CALL IN 3 DAYS IF SHE HASN'T HAD A BM.

## 2012-04-28 NOTE — Telephone Encounter (Signed)
[name] called today. She is having trouble going to the bathroom, she has not gone to the bathroom in over a week and a half.  She has tried some over the medication, and still is not going. She has a lot of gas and has leakage.  She would like a call back.

## 2012-04-28 NOTE — Telephone Encounter (Signed)
CALL IN PHENERGAN 12.5 MG PO Q6H PRN FOR NAUSEA/VOMTIING, #25, [id number].

## 2012-05-01 ENCOUNTER — Ambulatory Visit: Payer: PRIVATE HEALTH INSURANCE | Admitting: Gastroenterology

## 2012-05-14 ENCOUNTER — Encounter: Payer: Self-pay | Admitting: Gastroenterology

## 2012-05-15 ENCOUNTER — Encounter: Payer: Self-pay | Admitting: Gastroenterology

## 2012-05-15 ENCOUNTER — Ambulatory Visit (INDEPENDENT_AMBULATORY_CARE_PROVIDER_SITE_OTHER): Payer: PRIVATE HEALTH INSURANCE | Admitting: Gastroenterology

## 2012-05-15 VITALS — BP 109/72 | HR 73 | Temp 96.6°F | Ht 65.0 in | Wt 155.6 lb

## 2012-05-15 DIAGNOSIS — K219 Gastro-esophageal reflux disease without esophagitis: Secondary | ICD-10-CM

## 2012-05-15 DIAGNOSIS — K6289 Other specified diseases of anus and rectum: Secondary | ICD-10-CM | POA: Insufficient documentation

## 2012-05-15 DIAGNOSIS — K589 Irritable bowel syndrome without diarrhea: Secondary | ICD-10-CM

## 2012-05-15 MED ORDER — HYDROCORTISONE ACE-PRAMOXINE 1-1 % RE CREA: TOPICAL_CREAM | RECTAL | Status: DC

## 2012-05-15 NOTE — Addendum Note (Signed)
Addended by: [place] on: 05/15/2012 11:50 AM   Modules accepted: Orders

## 2012-05-15 NOTE — Assessment & Plan Note (Signed)
LIKELY DUE TO HEMORRHOID FLARE FROM FREQUENT BMs.   USE PROCTO-CREAM FOR BURNING IN YOUR RECTUM.  USE IMODIUM DAILY. IF YOU GET CONSTIPATED, USE IT EVERY OTHER DAY. USE AN EXTRA ONE ON DAYS YOU HAVE > 5 LOOSE BMs. FOLLOW UP IN 4 MOS.

## 2012-05-15 NOTE — Patient Instructions (Signed)
USE PROCTO-CREAM FOR BURNING IN YOUR RECTUM.  USE IMODIUM DAILY. IF YOU GET CONSTIPATED, USE IT EVERY OTHER DAY. USE AN EXTRA ONE ON DAYS YOU HAVE > 5 LOOSE BMs.  FOLLOW A LOW FAT/SOFT MECHANICAL DIET TO HELP CONTROL YOUR REFLUX SYMPTOMS.Berneda Rose DEXILANT.  FOLLOW UP IN 4 MOS.  SOFT MECHANICAL DIET This SOFT MECHANICAL DIET is restricted to:  Foods that are moist, soft-textured, and easy to chew and swallow. MEATS SHOULD BE CHOPPED OR GROUND.  Meats that are ground or are minced no larger than one-quarter inch pieces. Meats are moist with gravy or sauce added.   Foods that do not include bread or bread-like textures except soft pancakes, well-moistened with syrup or sauce.   Textures with some chewing ability required.   Casseroles without rice.   Cooked vegetables that are less than half an inch in size and easily mashed with a fork. No cooked corn, peas, broccoli, cauliflower, cabbage, Brussels sprouts, asparagus, or other fibrous, non-tender or rubbery cooked vegetables.   Canned fruit except for pineapple. Fruit must be cut into pieces no larger than half an inch in size.   Foods that do not include nuts, seeds, coconut, or sticky textures.    FOOD TEXTURES FOR DYSPHAGIA DIET LEVEL 2 -SOFT MECHANICAL DIET (includes all foods on Dysphagia Diet Level 1 - Pureed, in addition to the foods listed below)  FOOD GROUP: Breads. RECOMMENDED: Soft pancakes, well-moistened with syrup or sauce. AVOID: All others.  FOOD GROUP: Cereals. RECOMMENDED: Cooked cereals with little texture, including oatmeal. Unprocessed wheat bran stirred into cereals for bulk. Note: If thin liquids are restricted, it is important that all of the liquid is absorbed into the cereal. AVOID: All dry cereals and any cooked cereals that may contain flax seeds or other seeds or nuts. Whole-grain, dry, or coarse cereals. Cereals with nuts, seeds, dried fruit, and/or coconut.  FOOD GROUP: Desserts. RECOMMENDED:  Pudding, custard. Soft fruit pies with bottom crust only. Canned fruit (excluding pineapple). Soft, moist cakes with icing.Frozen malts, milk shakes, frozen yogurt, eggnog, nutritional supplements, ice cream, sherbet, regular or sugar-free gelatin, or any foods that become thin liquid at either room (70 F) or body temperature (98 F). AVOID: Dry, coarse cakes and cookies. Anything with nuts, seeds, coconut, pineapple, or dried fruit. Breakfast yogurt with nuts. Rice or bread pudding.  FOOD GROUP: Fats. RECOMMENDED: Butter, margarine, cream for cereal (depending on liquid consistency recommendations), gravy, cream sauces, sour cream, sour cream dips with soft additives, mayonnaise, salad dressings, cream cheese, cream cheese spreads with soft additives, whipped toppings. AVOID: All fats with coarse or chunky additives.  FOOD GROUP: Fruits. RECOMMENDED: Soft drained, canned, or cooked fruits without seeds or skin. Fresh soft and ripe banana. Fruit juices with a small amount of pulp. If thin liquids are restricted, fruit juices should be thickened to appropriate consistency. AVOID: Fresh or frozen fruits. Cooked fruit with skin or seeds. Dried fruits. Fresh, canned, or cooked pineapple.  FOOD GROUP: Meats and Meat Substitutes. (Meat pieces should not exceed 1/4 of an inch cube and should be tender.) RECOMMENDED: Moistened ground or cooked meat, poultry, or fish. Moist ground or tender meat may be served with gravy or sauce. Casseroles without rice. Moist macaroni and cheese, well-cooked pasta with meat sauce, tuna noodle casserole, soft, moist lasagna. Moist meatballs, meatloaf, or fish loaf. Protein salads, such as tuna or egg without large chunks, celery, or onion. Cottage cheese, smooth quiche without large chunks. Poached, scrambled, or soft-cooked eggs (egg  yolks should not be "runny" but should be moist and able to be mashed with butter, margarine, or other moisture added to them). (Cook eggs to  160 F or use pasteurized eggs for safety.) Souffls may have small, soft chunks. Tofu. Well-cooked, slightly mashed, moist legumes, such as baked beans. All meats or protein substitutes should be served with sauces or moistened to help maintain cohesiveness in the oral cavity. AVOID: Dry meats, tough meats (such as bacon, sausage, hot dogs, bratwurst). Dry casseroles or casseroles with rice or large chunks. Peanut butter. Cheese slices and cubes. Hard-cooked or crisp fried eggs. Sandwiches.Pizza.  FOOD GROUP: Potatoes and Starches. RECOMMENDED: Well-cooked, moistened, boiled, baked, or mashed potatoes. Well-cooked shredded hash brown potatoes that are not crisp. (All potatoes need to be moist and in sauces.)Well-cooked noodles in sauce. Spaetzel or soft dumplings that have been moistened with butter or gravy. AVOID: Potato skins and chips. Fried or French-fried potatoes. Rice.  FOOD GROUP: Soups. RECOMMENDED: Soups with easy-to-chew or easy-to-swallow meats or vegetables: Particle sizes in soups should be less than 1/2 inch. Soups will need to be thickened to appropriate consistency if soup is thinner than prescribed liquid consistency. AVOID: Soups with large chunks of meat and vegetables. Soups with rice, corn, peas.  FOOD GROUP: Vegetables. RECOMMENDED: All soft, well-cooked vegetables. Vegetables should be less than a half inch. Should be easily mashed with a fork. AVOID: Cooked corn and peas. Broccoli, cabbage, Brussels sprouts, asparagus, or other fibrous, non-tender or rubbery cooked vegetables.  FOOD GROUP: Miscellaneous. RECOMMENDED: Jams and preserves without seeds, jelly. Sauces, salsas, etc., that may have small tender chunks less than 1/2 inch. Soft, smooth chocolate bars that are easily chewed. AVOID: Seeds, nuts, coconut, or sticky foods. Chewy candies such as caramels or licorice.      Low-Fat Diet BREADS, CEREALS, PASTA, RICE, DRIED PEAS, AND BEANS These products are high  in carbohydrates and most are low in fat. Therefore, they can be increased in the diet as substitutes for fatty foods. They too, however, contain calories and should not be eaten in excess. Cereals can be eaten for snacks as well as for breakfast.   FRUITS AND VEGETABLES It is good to eat fruits and vegetables. Besides being sources of fiber, both are rich in vitamins and some minerals. They help you get the daily allowances of these nutrients. Fruits and vegetables can be used for snacks and desserts.  MEATS Limit lean meat, chicken, [place], and fish to no more than 6 ounces per day. Beef, Pork, and Lamb Use lean cuts of beef, pork, and lamb. Lean cuts include:  Extra-lean ground beef.  Arm roast.  Sirloin tip.  Center-cut ham.  Round steak.  Loin chops.  Rump roast.  Tenderloin.  Trim all fat off the outside of meats before cooking. It is not necessary to severely decrease the intake of red meat, but lean choices should be made. Lean meat is rich in protein and contains a highly absorbable form of iron. Premenopausal women, in particular, should avoid reducing lean red meat because this could increase the risk for low red blood cells (iron-deficiency anemia).  Chicken and [place] These are good sources of protein. The fat of poultry can be reduced by removing the skin and underlying fat layers before cooking. Chicken and [place] can be substituted for lean red meat in the diet. Poultry should not be fried or covered with high-fat sauces. Fish and Shellfish Fish is a good source of protein. Shellfish contain cholesterol, but they  usually are low in saturated fatty acids. The preparation of fish is important. Like chicken and [place], they should not be fried or covered with high-fat sauces. EGGS Egg whites contain no fat or cholesterol. They can be eaten often. Try 1 to 2 egg whites instead of whole eggs in recipes or use egg substitutes that do not contain yolk. MILK AND DAIRY PRODUCTS Use  skim or 1% milk instead of 2% or whole milk. Decrease whole milk, natural, and processed cheeses. Use nonfat or low-fat (2%) cottage cheese or low-fat cheeses made from vegetable oils. Choose nonfat or low-fat (1 to 2%) yogurt. Experiment with evaporated skim milk in recipes that call for heavy cream. Substitute low-fat yogurt or low-fat cottage cheese for sour cream in dips and salad dressings. Have at least 2 servings of low-fat dairy products, such as 2 glasses of skim (or 1%) milk each day to help get your daily calcium intake. FATS AND OILS Reduce the total intake of fats, especially saturated fat. Butterfat, lard, and beef fats are high in saturated fat and cholesterol. These should be avoided as much as possible. Vegetable fats do not contain cholesterol, but certain vegetable fats, such as coconut oil, palm oil, and palm kernel oil are very high in saturated fats. These should be limited. These fats are often used in bakery goods, processed foods, popcorn, oils, and nondairy creamers. Vegetable shortenings and some peanut butters contain hydrogenated oils, which are also saturated fats. Read the labels on these foods and check for saturated vegetable oils. Unsaturated vegetable oils and fats do not raise blood cholesterol. However, they should be limited because they are fats and are high in calories. Total fat should still be limited to 30% of your daily caloric intake. Desirable liquid vegetable oils are corn oil, cottonseed oil, olive oil, canola oil, safflower oil, soybean oil, and sunflower oil. Peanut oil is not as good, but small amounts are acceptable. Buy a heart-healthy tub margarine that has no partially hydrogenated oils in the ingredients. Mayonnaise and salad dressings often are made from unsaturated fats, but they should also be limited because of their high calorie and fat content. Seeds, nuts, peanut butter, olives, and avocados are high in fat, but the fat is mainly the unsaturated  type. These foods should be limited mainly to avoid excess calories and fat. OTHER EATING TIPS Snacks  Most sweets should be limited as snacks. They tend to be rich in calories and fats, and their caloric content outweighs their nutritional value. Some good choices in snacks are graham crackers, melba toast, soda crackers, bagels (no egg), English muffins, fruits, and vegetables. These snacks are preferable to snack crackers, [place] fries, TORTILLA CHIPS, and POTATO chips. Popcorn should be air-popped or cooked in small amounts of liquid vegetable oil. Desserts Eat fruit, low-fat yogurt, and fruit ices instead of pastries, cake, and cookies. Sherbet, angel food cake, gelatin dessert, frozen low-fat yogurt, or other frozen products that do not contain saturated fat (pure fruit juice bars, frozen ice pops) are also acceptable.  COOKING METHODS Choose those methods that use little or no fat. They include: Poaching.  Braising.  Steaming.  Grilling.  Baking.  Stir-frying.  Broiling.  Microwaving.  Foods can be cooked in a nonstick pan without added fat, or use a nonfat cooking spray in regular cookware. Limit fried foods and avoid frying in saturated fat. Add moisture to lean meats by using water, broth, cooking wines, and other nonfat or low-fat sauces along with the cooking  methods mentioned above. Soups and stews should be chilled after cooking. The fat that forms on top after a few hours in the refrigerator should be skimmed off. When preparing meals, avoid using excess salt. Salt can contribute to raising blood pressure in some people.  EATING AWAY FROM HOME Order entres, potatoes, and vegetables without sauces or butter. When meat exceeds the size of a deck of cards (3 to 4 ounces), the rest can be taken home for another meal. Choose vegetable or fruit salads and ask for low-calorie salad dressings to be served on the side. Use dressings sparingly. Limit high-fat toppings, such as bacon,  crumbled eggs, cheese, sunflower seeds, and olives. Ask for heart-healthy tub margarine instead of butter.

## 2012-05-15 NOTE — Progress Notes (Signed)
Faxed to PCP

## 2012-05-15 NOTE — Progress Notes (Signed)
  Subjective:    Patient ID: [name], female    DOB: [birth year], 51 y.o.   MRN: [id number]  PCP: [facility]  HPI EVERY THREE DAYS HAVE NUMEROUS IN THE MORNING: FORMED THEN AT THE END SML CALIBER LOOSE STOOLS. CAN BE WATERY. MAY TAKE 10 TIMES THROUGHOUT THE MORNING. PEPTO MAKES STOMACH UPSET/BLACK STOOLS. IMODIUM WORKS BETTER. ITCHES IN HER RECTUM-FEELS LIKE IT'S ON FIRE. BOWELS ARE A LITTLE BETTER SINCE THE JUN 2013. HAD SEVERE EPIGASTRIC PAIN AND BETTER WITH TIME/TUMS. MILD LLQ ABD PAIN  WITH IBS.   Past Medical History  Diagnosis Date  . S/P colonoscopy 06/02/09    normal (Dr. Linna Darner)  . PUD (peptic ulcer disease) 01/2007    EGD Dr Jena Gauss, 2 antral ulcers, negative h pylori  . MRSA infection     Dr. Lenice Pressman, currently under treatment  . Hiatal hernia 2008    on EGD above  . Asthma   . PTSD (post-traumatic stress disorder)   . Migraines   . Degenerative disc disease   . Rectal prolapse   . SBO (small bowel obstruction)   . ADHD (attention deficit hyperactivity disorder)   . S/P endoscopy 03/26/11    gastritis-Dr Darrick Penna  . Clostridium difficile colitis 04/10/11 &11/2011    tx w/ flagyl  . Kidney stones   . Chronic abdominal pain 2003    EX LAP APR 2004 RUPTURE L OV CYST, JUL 2004 ADHESIONS  . Irritable bowel syndrome 2004 CONSTIPATION  . BMI (body mass index) 20.0-29.9 2009 127 LBS    Past Surgical History  Procedure Date  . Bowel resection     x2, secondary to adhesions  . Appendectomy   . Laparoscopic lysis intestinal adhesions   . Back surgery   . Abdominal hysterectomy   . Tubal ligation   . Umbilical hernia repair 2008    x5  . Neck surgery 2009  . Partial hysterectomy   . Upper gastrointestinal endoscopy APR 2008 [facility] BLEEDING/PAIN    PUD  . Upper gastrointestinal endoscopy JUL 2012 [facility] PAIN    MILD GASTRITIS  . Esophagogastroduodenoscopy 03/23/2011     Normal esophagus without evidence of Barrett's, mass, erosions, or ulcerations./ Patchy erythema  with occasional erosion in the antrum.  Biopsies  obtained via cold forceps to evaluate for H. pylori gastritis/ Small hiatal hernia./Normal duodenal bulb and second portion of the duodenum.  . Cervical spine surgery   . Flexible sigmoidoscopy 07/14/2002    Normal limited flexible sigmoidoscopy with stool in the rectum and rectosigmoid precluding a full colonoscopy      Review of Systems     Objective:   Physical Exam        Assessment & Plan:

## 2012-05-15 NOTE — Assessment & Plan Note (Signed)
Sx FAIRLY WELL CONTROLLED.   USE IMODIUM DAILY. IF YOU GET CONSTIPATED, USE IT EVERY OTHER DAY. USE AN EXTRA ONE ON DAYS YOU HAVE > 5 LOOSE BMs.  FOLLOW UP IN 4 MOS.

## 2012-05-15 NOTE — Assessment & Plan Note (Signed)
SX FAIRLY WELL CONTROLLED.  FOLLOW A LOW FAT/SOFT MECHANICAL DIET TO HELP CONTROL REFLUX SYMPTOMS.  CONTINUE DEXILANT.  FOLLOW UP IN 4 MOS.

## 2012-05-16 NOTE — Progress Notes (Signed)
Reminder in epic to follow up in 4 months with SF in E30 

## 2012-05-17 ENCOUNTER — Encounter (HOSPITAL_COMMUNITY): Payer: Self-pay | Admitting: *Deleted

## 2012-05-17 ENCOUNTER — Emergency Department (HOSPITAL_COMMUNITY)
Admission: EM | Admit: 2012-05-17 | Discharge: 2012-05-17 | Disposition: A | Discharge status: Home / Self Care | Payer: PRIVATE HEALTH INSURANCE | Attending: Emergency Medicine | Admitting: Emergency Medicine

## 2012-05-17 DIAGNOSIS — N390 Urinary tract infection, site not specified: Secondary | ICD-10-CM

## 2012-05-17 DIAGNOSIS — F17200 Nicotine dependence, unspecified, uncomplicated: Secondary | ICD-10-CM | POA: Insufficient documentation

## 2012-05-17 DIAGNOSIS — F909 Attention-deficit hyperactivity disorder, unspecified type: Secondary | ICD-10-CM | POA: Insufficient documentation

## 2012-05-17 DIAGNOSIS — J45909 Unspecified asthma, uncomplicated: Secondary | ICD-10-CM | POA: Insufficient documentation

## 2012-05-17 DIAGNOSIS — Z882 Allergy status to sulfonamides status: Secondary | ICD-10-CM | POA: Insufficient documentation

## 2012-05-17 DIAGNOSIS — Z87442 Personal history of urinary calculi: Secondary | ICD-10-CM | POA: Insufficient documentation

## 2012-05-17 DIAGNOSIS — K589 Irritable bowel syndrome without diarrhea: Secondary | ICD-10-CM | POA: Insufficient documentation

## 2012-05-17 DIAGNOSIS — Z880 Allergy status to penicillin: Secondary | ICD-10-CM | POA: Insufficient documentation

## 2012-05-17 LAB — URINE MICROSCOPIC-ADD ON

## 2012-05-17 LAB — URINALYSIS, ROUTINE W REFLEX MICROSCOPIC
Glucose, UA: 100 mg/dL — AB
Hgb urine dipstick: NEGATIVE
Urobilinogen, UA: >8 mg/dL — ABNORMAL HIGH (ref 0.0–1.0)
pH: 5 (ref 5.0–8.0)

## 2012-05-17 MED ORDER — CIPROFLOXACIN HCL 500 MG PO TABS: 500.0000 mg | ORAL_TABLET | Freq: Two times a day (BID) | ORAL | Status: AC

## 2012-05-17 MED ORDER — CIPROFLOXACIN HCL 250 MG PO TABS
500.0000 mg | ORAL_TABLET | Freq: Once | ORAL | Status: AC
  Administered 2012-05-17: 500 mg via ORAL
  Filled 2012-05-17: qty 2

## 2012-05-17 NOTE — ED Provider Notes (Signed)
History     CSN: [id number]  Arrival date & time 05/17/12  1202   First MD Initiated Contact with Patient 05/17/12 1232      Chief Complaint  Patient presents with  . Urinary Tract Infection    (Consider location/radiation/quality/duration/timing/severity/associated sxs/prior treatment) HPI Comments: Pt c/o dysuria, frequency and urgency  For several days.  No hematuria.  Began having R lower back pain last PM.  Saw her GYN MD yest but u/a results are still pending so she came here.  No fever or chills.  Patient is a 51 y.o. female presenting with urinary tract infection. The history is provided by the patient. No language interpreter was used.  Urinary Tract Infection This is a new problem. Episode onset: several days ago. The problem has been unchanged. Pertinent negatives include no chills or fever. Treatments tried: taking AZO.    Past Medical History  Diagnosis Date  . S/P colonoscopy 06/02/09    normal (Dr. Linna Darner)  . PUD (peptic ulcer disease) 01/2007    EGD Dr Jena Gauss, 2 antral ulcers, negative h pylori  . MRSA infection     Dr. Lenice Pressman, currently under treatment  . Hiatal hernia 2008    on EGD above  . Asthma   . PTSD (post-traumatic stress disorder)   . Migraines   . Degenerative disc disease   . Rectal prolapse   . SBO (small bowel obstruction)   . ADHD (attention deficit hyperactivity disorder)   . S/P endoscopy 03/26/11    gastritis-Dr Darrick Penna  . Clostridium difficile colitis 04/10/11 &11/2011    tx w/ flagyl  . Kidney stones   . Chronic abdominal pain 2003    EX LAP APR 2004 RUPTURE L OV CYST, JUL 2004 ADHESIONS  . Irritable bowel syndrome 2004 CONSTIPATION  . BMI (body mass index) 20.0-29.9 2009 127 LBS    Past Surgical History  Procedure Date  . Bowel resection     x2, secondary to adhesions  . Appendectomy   . Laparoscopic lysis intestinal adhesions   . Back surgery   . Abdominal hysterectomy   . Tubal ligation   . Umbilical hernia repair  2008    x5  . Neck surgery 2009  . Partial hysterectomy   . Upper gastrointestinal endoscopy APR 2008 [facility] BLEEDING/PAIN    PUD  . Upper gastrointestinal endoscopy JUL 2012 [facility] PAIN    MILD GASTRITIS  . Esophagogastroduodenoscopy 03/23/2011     Normal esophagus without evidence of Barrett's, mass, erosions, or ulcerations./ Patchy erythema with occasional erosion in the antrum.  Biopsies  obtained via cold forceps to evaluate for H. pylori gastritis/ Small hiatal hernia./Normal duodenal bulb and second portion of the duodenum.  . Cervical spine surgery   . Flexible sigmoidoscopy 07/14/2002    Normal limited flexible sigmoidoscopy with stool in the rectum and rectosigmoid precluding a full colonoscopy    Family History  Problem Relation Age of Onset  . Adopted: Yes    History  Substance Use Topics  . Smoking status: Current Everyday Smoker -- 1.0 packs/day for 1 years    Types: Cigarettes  . Smokeless tobacco: Former Neurosurgeon  . Alcohol Use: No    OB History    Grav Para Term Preterm Abortions TAB SAB Ect Mult Living   1 1 1       1       Review of Systems  Constitutional: Negative for fever and chills.  Genitourinary: Positive for dysuria, urgency and frequency. Negative for hematuria.  Musculoskeletal:  Positive for back pain.  All other systems reviewed and are negative.    Allergies  Penicillins; Sulfa antibiotics; Aspirin; Ibuprofen; Lactaid; and Morphine and related  Home Medications   Current Outpatient Rx  Name Route Sig Dispense Refill  . ALBUTEROL 90 MCG/ACT IN AERS Inhalation Inhale 2 puffs into the lungs every 6 (six) hours as needed. For shortness of breath    . ALPRAZOLAM 1 MG PO TABS Oral Take 1 mg by mouth 4 (four) times daily as needed. Anxiety    . AMPHETAMINE-DEXTROAMPHET ER 15 MG PO CP24 Oral Take 15 mg by mouth 2 (two) times daily. As needed    . CIPROFLOXACIN HCL 500 MG PO TABS Oral Take 1 tablet (500 mg total) by mouth 2 (two) times daily. 10 tablet  0  . DEXLANSOPRAZOLE 60 MG PO CPDR Oral Take 60 mg by mouth every morning.     . DULOXETINE HCL 60 MG PO CPEP Oral Take 120 mg by mouth every morning.     Marland Kitchen FAMOTIDINE 20 MG PO TABS Oral Take 20 mg by mouth 3 (three) times daily.    Marland Kitchen GABAPENTIN 300 MG PO CAPS Oral Take 600-900 mg by mouth 2 (two) times daily. Takes 600 mg every morning and 900 mg in the evening    . ACIDOPHILUS PO CAPS Oral Take 2 capsules by mouth daily.     Marland Kitchen LIDOCAINE 5 % EX PTCH Transdermal Place 1 patch onto the skin daily as needed. Uses for pain in back. Not used regularly.    Marland Kitchen LOPERAMIDE HCL 2 MG PO TABS Oral Take 2 mg by mouth 4 (four) times daily as needed.    Marland Kitchen MONTELUKAST SODIUM 10 MG PO TABS Oral Take 10 mg by mouth daily.     . ADULT MULTIVITAMIN W/MINERALS CH Oral Take 1 tablet by mouth daily.    Marland Kitchen PHENERGAN 12.5 MG PO TABS  TAKE ONE TABLET BY MOUTH EVERY 6 HOURS AS NEEDED. 60 each 0  . HYDROCORTISONE ACE-PRAMOXINE 1-1 % RE CREA  USE PR 2 TO 4 TIMES A DAY FOR 10 DAYS. 30 g 0  . SINUS WASH NETI POT NA Nasal Place into the nose as needed. For sinus congestion    . TOPIRAMATE 25 MG PO TABS Oral Take 25 mg by mouth 2 (two) times daily.     . TRAMADOL HCL 50 MG PO TABS Oral Take 50 mg by mouth every 8 (eight) hours as needed. For pain    . ZOLPIDEM TARTRATE ER 12.5 MG PO TBCR Oral Take 12.5 mg by mouth at bedtime as needed. For sleep      BP 125/75  Pulse 91  Temp 98.9 F (37.2 C)  Resp 20  Ht 5\' 5"  (1.651 m)  Wt 155 lb (70.308 kg)  BMI 25.79 kg/m2  SpO2 98%  Physical Exam  Nursing note and vitals reviewed. Constitutional: She is oriented to person, place, and time. She appears well-developed and well-nourished. No distress.  HENT:  Head: Normocephalic and atraumatic.  Eyes: EOM are normal.  Neck: Normal range of motion.  Cardiovascular: Normal rate, regular rhythm and normal heart sounds.   Pulmonary/Chest: Effort normal and breath sounds normal.  Abdominal: Soft. Normal appearance. She exhibits no  distension. There is tenderness in the suprapubic area. There is no rigidity, no rebound, no guarding, no CVA tenderness, no tenderness at McBurney's point and negative Murphy's sign.  Musculoskeletal: Normal range of motion.       Arms: Neurological: She is  alert and oriented to person, place, and time.  Skin: Skin is warm and dry.  Psychiatric: She has a normal mood and affect. Judgment normal.    ED Course  Procedures (including critical care time)  Labs Reviewed  URINALYSIS, ROUTINE W REFLEX MICROSCOPIC - Abnormal; Notable for the following:    Color, Urine ORANGE (*)  BIOCHEMICALS MAY BE AFFECTED BY COLOR   Specific Gravity, Urine <1.005 (*)     Glucose, UA 100 (*)     Protein, ur 100 (*)     Urobilinogen, UA >8.0 (*)     Nitrite POSITIVE (*)     Leukocytes, UA TRACE (*)     All other components within normal limits  URINE MICROSCOPIC-ADD ON - Abnormal; Notable for the following:    Squamous Epithelial / LPF FEW (*)     Casts HYALINE CASTS (*)     All other components within normal limits  URINE CULTURE   No results found. Discussed u/a results with pt.  Findings are equivocal but sxs classic for UTI.  Will tx with cipro x 5 days and culture urine.  Pt to f/u with PCP or GYN MD.  1. UTI (urinary tract infection)       MDM  rx-cipro  500, BID, 10        Evalina Field, [place] 05/17/12 1316

## 2012-05-17 NOTE — ED Provider Notes (Signed)
Medical screening examination/treatment/procedure(s) were performed by non-physician practitioner and as supervising physician I was immediately available for consultation/collaboration.  Kaianna Dolezal, MD 05/17/12 1517 

## 2012-05-17 NOTE — ED Notes (Signed)
Pt c/o urinary urgency and frequency as well as dysuria and right flank pain. Pt denies hematuria. Pt describes flank pain as throbbing. Pt was seen about a possible kidney infection yesterday but has not received results yet. Pt states the pain is too bad to wait for results.

## 2012-05-17 NOTE — ED Notes (Signed)
Pt c/o UTI symptoms for the past two days, burning with urination, increased frequency, lower back pain, lower abd pressure, started taking AZO today with some relief with pressure.

## 2012-05-18 LAB — URINE CULTURE: Colony Count: 1000

## 2012-05-20 ENCOUNTER — Telehealth: Payer: Self-pay | Admitting: *Deleted

## 2012-05-20 NOTE — Telephone Encounter (Signed)
Routing to refill. 

## 2012-05-20 NOTE — Telephone Encounter (Signed)
[name] called today. Dr Darrick Penna called her in some hemorrhoid cream last week, however her ins wont cover the cost. She would like something that her ins will cover to be called in for her. She would like it done soon, since she is in a lot of discomfort.

## 2012-05-21 MED ORDER — HYDROCORTISONE 2.5 % RE CREA: TOPICAL_CREAM | Freq: Two times a day (BID) | RECTAL | Status: AC

## 2012-05-21 NOTE — Telephone Encounter (Signed)
Sent anusol cream. Let me know if not covered.

## 2012-05-21 NOTE — Telephone Encounter (Signed)
LMOM for pt that new Rx sent in. Call if not covered.

## 2012-07-30 ENCOUNTER — Other Ambulatory Visit (HOSPITAL_COMMUNITY): Payer: Self-pay | Admitting: Internal Medicine

## 2012-07-30 DIAGNOSIS — Z780 Asymptomatic menopausal state: Secondary | ICD-10-CM

## 2012-08-04 ENCOUNTER — Ambulatory Visit (HOSPITAL_COMMUNITY)
Admission: RE | Admit: 2012-08-04 | Discharge: 2012-08-04 | Disposition: A | Discharge status: Home / Self Care | Payer: PRIVATE HEALTH INSURANCE | Source: Ambulatory Visit | Attending: Internal Medicine | Admitting: Internal Medicine

## 2012-08-04 DIAGNOSIS — M949 Disorder of cartilage, unspecified: Secondary | ICD-10-CM | POA: Insufficient documentation

## 2012-08-04 DIAGNOSIS — F17200 Nicotine dependence, unspecified, uncomplicated: Secondary | ICD-10-CM | POA: Insufficient documentation

## 2012-08-04 DIAGNOSIS — M899 Disorder of bone, unspecified: Secondary | ICD-10-CM | POA: Insufficient documentation

## 2012-08-04 DIAGNOSIS — Z780 Asymptomatic menopausal state: Secondary | ICD-10-CM

## 2012-08-29 ENCOUNTER — Encounter: Payer: Self-pay | Admitting: *Deleted

## 2012-09-17 ENCOUNTER — Other Ambulatory Visit: Payer: Self-pay | Admitting: Gastroenterology

## 2012-12-25 ENCOUNTER — Ambulatory Visit (HOSPITAL_COMMUNITY)
Admission: RE | Admit: 2012-12-25 | Payer: PRIVATE HEALTH INSURANCE | Source: Ambulatory Visit | Admitting: Physical Therapy

## 2013-01-01 ENCOUNTER — Ambulatory Visit (HOSPITAL_COMMUNITY)
Admission: RE | Admit: 2013-01-01 | Payer: PRIVATE HEALTH INSURANCE | Source: Ambulatory Visit | Admitting: Physical Therapy

## 2013-01-14 ENCOUNTER — Encounter (HOSPITAL_COMMUNITY): Payer: Self-pay | Admitting: Emergency Medicine

## 2013-01-14 ENCOUNTER — Emergency Department (HOSPITAL_COMMUNITY): Payer: PRIVATE HEALTH INSURANCE

## 2013-01-14 ENCOUNTER — Telehealth: Payer: Self-pay | Admitting: Gastroenterology

## 2013-01-14 ENCOUNTER — Emergency Department (HOSPITAL_COMMUNITY)
Admission: EM | Admit: 2013-01-14 | Discharge: 2013-01-14 | Disposition: A | Discharge status: Home / Self Care | Payer: PRIVATE HEALTH INSURANCE | Attending: Emergency Medicine | Admitting: Emergency Medicine

## 2013-01-14 DIAGNOSIS — F4310 Post-traumatic stress disorder, unspecified: Secondary | ICD-10-CM | POA: Insufficient documentation

## 2013-01-14 DIAGNOSIS — R109 Unspecified abdominal pain: Secondary | ICD-10-CM

## 2013-01-14 DIAGNOSIS — Z87442 Personal history of urinary calculi: Secondary | ICD-10-CM | POA: Insufficient documentation

## 2013-01-14 DIAGNOSIS — F909 Attention-deficit hyperactivity disorder, unspecified type: Secondary | ICD-10-CM | POA: Insufficient documentation

## 2013-01-14 DIAGNOSIS — IMO0002 Reserved for concepts with insufficient information to code with codable children: Secondary | ICD-10-CM | POA: Insufficient documentation

## 2013-01-14 DIAGNOSIS — Z9889 Other specified postprocedural states: Secondary | ICD-10-CM | POA: Insufficient documentation

## 2013-01-14 DIAGNOSIS — A4902 Methicillin resistant Staphylococcus aureus infection, unspecified site: Secondary | ICD-10-CM | POA: Insufficient documentation

## 2013-01-14 DIAGNOSIS — Z8679 Personal history of other diseases of the circulatory system: Secondary | ICD-10-CM | POA: Insufficient documentation

## 2013-01-14 DIAGNOSIS — Z8719 Personal history of other diseases of the digestive system: Secondary | ICD-10-CM | POA: Insufficient documentation

## 2013-01-14 DIAGNOSIS — J45909 Unspecified asthma, uncomplicated: Secondary | ICD-10-CM | POA: Insufficient documentation

## 2013-01-14 DIAGNOSIS — F17200 Nicotine dependence, unspecified, uncomplicated: Secondary | ICD-10-CM | POA: Insufficient documentation

## 2013-01-14 DIAGNOSIS — Z8711 Personal history of peptic ulcer disease: Secondary | ICD-10-CM | POA: Insufficient documentation

## 2013-01-14 LAB — COMPREHENSIVE METABOLIC PANEL
ALT: 12 U/L (ref 0–35)
Alkaline Phosphatase: 68 U/L (ref 39–117)
BUN: 17 mg/dL (ref 6–23)
CO2: 25 mEq/L (ref 19–32)
Chloride: 105 mEq/L (ref 96–112)
GFR calc Af Amer: >90 mL/min (ref >90)
Glucose, Bld: 103 mg/dL — ABNORMAL HIGH (ref 70–99)
Potassium: 4.4 mEq/L (ref 3.5–5.1)
Sodium: 137 mEq/L (ref 135–145)
Total Bilirubin: 0.2 mg/dL — ABNORMAL LOW (ref 0.3–1.2)
Total Protein: 6.1 g/dL (ref 6.0–8.3)

## 2013-01-14 LAB — CBC WITH DIFFERENTIAL/PLATELET
Hemoglobin: 11.5 g/dL — ABNORMAL LOW (ref 12.0–15.0)
Lymphocytes Relative: 20 % (ref 12–46)
Lymphs Abs: 2 10*3/uL (ref 0.7–4.0)
MCH: 30.5 pg (ref 26.0–34.0)
Monocytes Relative: 8 % (ref 3–12)
Neutro Abs: 7.1 10*3/uL (ref 1.7–7.7)
Neutrophils Relative %: 70 % (ref 43–77)
Platelets: 295 10*3/uL (ref 150–400)
RBC: 3.77 MIL/uL — ABNORMAL LOW (ref 3.87–5.11)
WBC: 10.1 10*3/uL (ref 4.0–10.5)

## 2013-01-14 LAB — LIPASE, BLOOD: Lipase: 18 U/L (ref 11–59)

## 2013-01-14 MED ORDER — PROMETHAZINE HCL 25 MG/ML IJ SOLN
25.0000 mg | Freq: Once | INTRAMUSCULAR | Status: AC
  Administered 2013-01-14: 25 mg via INTRAMUSCULAR
  Filled 2013-01-14: qty 1

## 2013-01-14 MED ORDER — HYDROMORPHONE HCL PF 2 MG/ML IJ SOLN
2.0000 mg | Freq: Once | INTRAMUSCULAR | Status: AC
  Administered 2013-01-14: 2 mg via INTRAMUSCULAR
  Filled 2013-01-14: qty 1

## 2013-01-14 NOTE — Telephone Encounter (Signed)
Pt is aware of what [facility] said but does not want to go to the ER.

## 2013-01-14 NOTE — Telephone Encounter (Signed)
Patient is stating after she eats she has excruciating pain under her right rib cage, grabbing pain, nausea feeling. Please call and advise?

## 2013-01-14 NOTE — Telephone Encounter (Signed)
PLEASE CALL PT. IF SHE IS HAVING SEVERE PAIN SHE SHOULD GO TO THE ED. SHE SHOULD FOLLOW A DAIRY FREE FULL LIQUID DIET IF SHE IS HAVING LOTS OF GAS. SHE MAY CHECK THE WEBSITE: [email or web address] OR PICK UP OUR FULL LIQUID DIET HANDOUT.  Full Liquid Diet A high-calorie, high-protein supplement should be used to meet your nutritional requirements when the full liquid diet is continued for more than 2 or 3 days. If this diet is to be used for an extended period of time (more than 7 days), a multivitamin should be considered.  Breads and Starches  Allowed: None are allowed except crackersWHOLE OR pureed (made into a thick, smooth soup) in soup. Cooked, refined corn, oat, rice, rye, and wheat cereals are also allowed.   Avoid: Any others.    Potatoes/Pasta/Rice  Allowed: ANY ITEM AS A SOUP OR SMALL PLATE OF MASHED POTATOES OR RICE.       Vegetables  Allowed: Strained tomato or vegetable juice. Vegetables pureed in soup.   Avoid: Any others.    Fruit  Allowed: Any strained fruit juices and fruit drinks. Include 1 serving of citrus or vitamin C-enriched fruit juice daily.   Avoid: Any others.  Meat and Meat Substitutes  Allowed: Egg  Avoid: Any meat, fish, or fowl. All cheese.  Milk  Allowed: SOY Milk beverages, including milk shakes and instant breakfast mixes. Smooth yogurt.   Avoid: Any others. Avoid dairy products if not tolerated.    Soups and Combination Foods  Allowed: Broth, strained cream soups. Strained, broth-based soups.   Avoid: Any others.    Desserts and Sweets  Allowed: flavored gelatin, tapioca, plain LACTOSE FREE ice cream, sherbet, smooth pudding, junket, fruit ices, frozen ice pops, pudding pops,, frozen fudge pops, chocolate syrup. Sugar, honey, jelly, syrup.   Avoid: Any others.  Fats and Oils  Allowed: Margarine, butter, cream, sour cream, oils.   Avoid: Any others.  Beverages  Allowed: All.   Avoid: None.  Condiments  Allowed: Iodized  salt, pepper, spices, flavorings. Cocoa powder.   Avoid: Any others.    SAMPLE MEAL PLAN Breakfast   cup orange juice.   1 cup cooked wheat cereal.   1 cup SOY milk.   1 cup beverage (coffee or tea).   Cream or sugar, if desired.    Midmorning Snack  2 SCRAMBLED OR HARD BOILED EGG   Lunch  1 cup cream soup.    cup fruit juice.   1 cup SOY milk.    cup custard.   1 cup beverage (coffee or tea).   Cream or sugar, if desired.    Midafternoon Snack  1 cup VANILLA SOY milk shake.  Dinner  1 cup cream soup.    cup fruit juice.   1 cup SOY MILK    cup pudding.   1 cup beverage (coffee or tea).   Cream or sugar, if desired.  Evening Snack  1 cup supplement.  To increase calories, add sugar, cream, butter, or margarine if possible. Nutritional supplements will also increase the total calories.

## 2013-01-14 NOTE — ED Provider Notes (Signed)
History     CSN: [id number]  Arrival date & time 01/14/13  1742   First MD Initiated Contact with Patient 01/14/13 1801      Chief Complaint  Patient presents with  . Abdominal Pain    (Consider location/radiation/quality/duration/timing/severity/associated sxs/prior treatment) HPI Comments: Patient with history of abdominal pain that has been worked up by GI.  Dr. Darrick Penna has performed and [place], HIDA scan, and endoscopy but no cause has been found.  Recently, while she has been eating she has developed crampy pain in the epigastric and right upper abdomen.  She had another episode tonight of the same that was worse that the others.  She called Dr. Darrick Penna and was told to come here.  No fevers or chills.  No urinary complaints.   Patient is a 52 y.o. female presenting with abdominal pain. The history is provided by the patient.  Abdominal Pain Pain location:  Epigastric and RUQ Pain quality: cramping   Pain radiates to:  Does not radiate Pain severity:  Severe Onset quality:  Sudden Duration:  2 hours Timing:  Constant Progression:  Improving Chronicity:  Recurrent Relieved by: time. Worsened by:  Eating   Past Medical History  Diagnosis Date  . S/P colonoscopy 06/02/09    normal (Dr. Linna Darner)  . PUD (peptic ulcer disease) 01/2007    EGD Dr Jena Gauss, 2 antral ulcers, negative h pylori  . MRSA infection     Dr. Lenice Pressman, currently under treatment  . Hiatal hernia 2008    on EGD above  . Asthma   . PTSD (post-traumatic stress disorder)   . Migraines   . Degenerative disc disease   . Rectal prolapse   . SBO (small bowel obstruction)   . ADHD (attention deficit hyperactivity disorder)   . S/P endoscopy 03/26/11    gastritis-Dr Darrick Penna  . Clostridium difficile colitis 04/10/11 &11/2011    tx w/ flagyl  . Kidney stones   . Chronic abdominal pain 2003    EX LAP APR 2004 RUPTURE L OV CYST, JUL 2004 ADHESIONS  . Irritable bowel syndrome 2004 CONSTIPATION  . BMI (body mass  index) 20.0-29.9 2009 127 LBS    Past Surgical History  Procedure Laterality Date  . Bowel resection      x2, secondary to adhesions  . Appendectomy    . Laparoscopic lysis intestinal adhesions    . Back surgery    . Abdominal hysterectomy    . Tubal ligation    . Umbilical hernia repair  2008    x5  . Neck surgery  2009  . Partial hysterectomy    . Upper gastrointestinal endoscopy  APR 2008 [facility] BLEEDING/PAIN    PUD  . Upper gastrointestinal endoscopy  JUL 2012 [facility] PAIN    MILD GASTRITIS  . Esophagogastroduodenoscopy  03/23/2011     Normal esophagus without evidence of Barrett's, mass, erosions, or ulcerations./ Patchy erythema with occasional erosion in the antrum.  Biopsies  obtained via cold forceps to evaluate for H. pylori gastritis/ Small hiatal hernia./Normal duodenal bulb and second portion of the duodenum.  . Cervical spine surgery    . Flexible sigmoidoscopy  07/14/2002    Normal limited flexible sigmoidoscopy with stool in the rectum and rectosigmoid precluding a full colonoscopy    Family History  Problem Relation Age of Onset  . Adopted: Yes    History  Substance Use Topics  . Smoking status: Current Every Day Smoker -- 1.00 packs/day for 1 years    Types: Cigarettes  .  Smokeless tobacco: Former Neurosurgeon  . Alcohol Use: No    OB History   Grav Para Term Preterm Abortions TAB SAB Ect Mult Living   1 1 1       1       Review of Systems  Gastrointestinal: Positive for abdominal pain.  All other systems reviewed and are negative.    Allergies  Penicillins; Sulfa antibiotics; Aspirin; Ibuprofen; Lactaid; and Morphine and related  Home Medications   Current Outpatient Rx  Name  Route  Sig  Dispense  Refill  . albuterol (PROVENTIL,VENTOLIN) 90 MCG/ACT inhaler   Inhalation   Inhale 2 puffs into the lungs every 6 (six) hours as needed. For shortness of breath         . ALPRAZolam (XANAX) 1 MG tablet   Oral   Take 1-2 mg by mouth 3 (three) times daily.  Patient takes 1 tablet twice a day and 2 tablets at bedtime         . amphetamine-dextroamphetamine (ADDERALL XR) 15 MG 24 hr capsule   Oral   Take 15 mg by mouth 2 (two) times daily. As needed         . dexlansoprazole (DEXILANT) 60 MG capsule   Oral   Take 60 mg by mouth every morning.          . DULoxetine (CYMBALTA) 60 MG capsule   Oral   Take 120 mg by mouth every morning.          . famotidine (PEPCID) 20 MG tablet   Oral   Take 20 mg by mouth 3 (three) times daily.         Marland Kitchen gabapentin (NEURONTIN) 300 MG capsule   Oral   Take 300-900 mg by mouth 2 (two) times daily. Patient takes 300mg  in the morning and 900mg  at night         . HYDROcodone-acetaminophen (NORCO) 10-325 MG per tablet   Oral   Take 1 tablet by mouth 4 (four) times daily.         . Lactobacillus (ACIDOPHILUS) CAPS   Oral   Take 2 capsules by mouth daily.          Marland Kitchen lidocaine (LIDODERM) 5 %   Transdermal   Place 1 patch onto the skin daily as needed. Uses for pain in back. Not used regularly.         . montelukast (SINGULAIR) 10 MG tablet   Oral   Take 10 mg by mouth daily.          . Multiple Vitamin (MULTIVITAMIN WITH MINERALS) TABS   Oral   Take 1 tablet by mouth daily.         Marland Kitchen topiramate (TOPAMAX) 25 MG tablet   Oral   Take 25 mg by mouth 2 (two) times daily.          . traMADol (ULTRAM) 50 MG tablet   Oral   Take 50 mg by mouth every 8 (eight) hours as needed. For pain         . zolpidem (AMBIEN CR) 12.5 MG CR tablet   Oral   Take 12.5 mg by mouth at bedtime as needed. For sleep           BP 99/58  Pulse 80  Temp(Src) 99 F (37.2 C) (Oral)  Ht 5' (1.524 m)  Wt 143 lb (64.864 kg)  BMI 27.93 kg/m2  SpO2 94%  Physical Exam  Nursing note and vitals reviewed. Constitutional: She is  oriented to person, place, and time. She appears well-developed and well-nourished. No distress.  HENT:  Head: Normocephalic and atraumatic.  Neck: Normal range of  motion. Neck supple.  Cardiovascular: Normal rate and regular rhythm.  Exam reveals no gallop and no friction rub.   No murmur heard. Pulmonary/Chest: Effort normal and breath sounds normal. No respiratory distress. She has no wheezes.  Abdominal: Soft. Bowel sounds are normal. She exhibits no distension. There is no rebound and no guarding.  There is ttp in the right upper quadrant and epigastrium.  Musculoskeletal: Normal range of motion.  Neurological: She is alert and oriented to person, place, and time.  Skin: Skin is warm and dry. She is not diaphoretic.    ED Course  Procedures (including critical care time)  Labs Reviewed  CBC WITH DIFFERENTIAL  COMPREHENSIVE METABOLIC PANEL  LIPASE, BLOOD   No results found.   No diagnosis found.    MDM  The patient is feeling better with meds given.  The workup does not reveal an elevated wbc, abnormal lfts, or obstruction.  She has had extensive workup into this condition by Dr. Darrick Penna, including HIDA scan, [place], CT, and endoscopy.  Nothing tonight appears emergent.  I believe she is stable for discharge with follow up with Dr. Darrick Penna.        Geoffery Lyons, MD 01/14/13 2006

## 2013-01-14 NOTE — Telephone Encounter (Signed)
I called and spoke to pt. She said she has been having a lot of gas for a couple of days. She hurts under her right rib cage especially after she eats. Said she is watching her diet. Has had cereal and microwaved potatoes. I told her if the pain really is excruciating, she would need to go to the ED. She said she is hoping it gets better, it is intermittent. I told her I will let Dr. Darrick Penna know.

## 2013-01-14 NOTE — ED Notes (Addendum)
Per EMS, pt has had abdominal pain x1 day. Per pt reports " that it feels like it did when I had a bowel obstruction". LBM yesterday. Pt reports 10/10 pain. Pt denies v/d. Pt reports nausea. Pt reports taking one hydrocodone prior to arrival with no relief. Pt alert and oriented. Nad noted.

## 2013-01-15 ENCOUNTER — Telehealth: Payer: Self-pay | Admitting: Gastroenterology

## 2013-01-15 MED ORDER — PROMETHAZINE HCL 12.5 MG PO TABS: ORAL_TABLET | ORAL | Status: DC

## 2013-01-15 NOTE — Telephone Encounter (Signed)
Patient was in the ED yesterday evening and she is calling today asking if we can prescribe her something for nausea, she stated she didn't receive anything in the ED for it, she uses [place], Please advise?

## 2013-01-15 NOTE — Telephone Encounter (Signed)
Called and informed pt.  

## 2013-01-15 NOTE — Telephone Encounter (Signed)
I called pt and she said she is just sitting around and worrying if the pain will happen again. She said the ED physician said that he would discuss her problems with Dr. Darrick Penna and she said she has not been given any answers. I made a copy of the diet/ recommendations that Dr. Jettie Booze said for her to do yesterday and told her she can pick up at front desk. She said she wanted some answers.

## 2013-01-15 NOTE — Telephone Encounter (Signed)
PLEASE CALL PT. DR. Kristofor Michalowski REVIEWED HER XRAY FROM YESTERDAY. Some of her abd pain is due to stool in her colon. SHE SHOULD USE MIRALAX ONE DOSE IN 8 OZ LIQUID TID FOR THE NEXT 3 DAYS TO CLEAN OUT HER COLON. STOP MIRALAX IF SHE DEVELOPS DIARRHEA.  A RX FOR PHENERGAN WAS SENT TO [place].

## 2013-01-15 NOTE — Addendum Note (Signed)
Addended by: [place] on: 01/15/2013 12:27 PM   Modules accepted: Orders

## 2013-01-22 ENCOUNTER — Other Ambulatory Visit (HOSPITAL_COMMUNITY): Payer: Self-pay | Admitting: Internal Medicine

## 2013-01-22 DIAGNOSIS — Z139 Encounter for screening, unspecified: Secondary | ICD-10-CM

## 2013-01-26 ENCOUNTER — Ambulatory Visit (HOSPITAL_COMMUNITY)
Admission: RE | Admit: 2013-01-26 | Discharge: 2013-01-26 | Disposition: A | Discharge status: Home / Self Care | Payer: PRIVATE HEALTH INSURANCE | Source: Ambulatory Visit | Attending: Internal Medicine | Admitting: Internal Medicine

## 2013-01-26 DIAGNOSIS — Z1231 Encounter for screening mammogram for malignant neoplasm of breast: Secondary | ICD-10-CM | POA: Insufficient documentation

## 2013-01-26 DIAGNOSIS — Z139 Encounter for screening, unspecified: Secondary | ICD-10-CM

## 2013-01-29 ENCOUNTER — Other Ambulatory Visit: Payer: Self-pay | Admitting: Internal Medicine

## 2013-01-29 DIAGNOSIS — R928 Other abnormal and inconclusive findings on diagnostic imaging of breast: Secondary | ICD-10-CM

## 2013-02-11 ENCOUNTER — Other Ambulatory Visit (HOSPITAL_COMMUNITY): Payer: Self-pay | Admitting: Internal Medicine

## 2013-02-11 ENCOUNTER — Ambulatory Visit (HOSPITAL_COMMUNITY)
Admission: RE | Admit: 2013-02-11 | Discharge: 2013-02-11 | Disposition: A | Discharge status: Home / Self Care | Payer: PRIVATE HEALTH INSURANCE | Source: Ambulatory Visit | Attending: Internal Medicine | Admitting: Internal Medicine

## 2013-02-11 DIAGNOSIS — R928 Other abnormal and inconclusive findings on diagnostic imaging of breast: Secondary | ICD-10-CM | POA: Insufficient documentation

## 2013-03-17 ENCOUNTER — Ambulatory Visit (HOSPITAL_COMMUNITY)
Admission: RE | Admit: 2013-03-17 | Discharge: 2013-03-17 | Disposition: A | Discharge status: Home / Self Care | Payer: PRIVATE HEALTH INSURANCE | Source: Ambulatory Visit | Attending: Physician Assistant | Admitting: Physician Assistant

## 2013-03-17 DIAGNOSIS — G8929 Other chronic pain: Secondary | ICD-10-CM | POA: Insufficient documentation

## 2013-03-17 DIAGNOSIS — M545 Low back pain, unspecified: Secondary | ICD-10-CM | POA: Diagnosis present

## 2013-03-17 DIAGNOSIS — R1011 Right upper quadrant pain: Secondary | ICD-10-CM | POA: Insufficient documentation

## 2013-03-17 DIAGNOSIS — M542 Cervicalgia: Secondary | ICD-10-CM | POA: Diagnosis present

## 2013-03-17 DIAGNOSIS — M549 Dorsalgia, unspecified: Secondary | ICD-10-CM | POA: Insufficient documentation

## 2013-03-17 DIAGNOSIS — IMO0001 Reserved for inherently not codable concepts without codable children: Secondary | ICD-10-CM | POA: Insufficient documentation

## 2013-03-17 NOTE — Evaluation (Signed)
Physical Therapy Evaluation  Patient Details  Name: [name] MRN: [id number] Date of Birth: 11-30-1960  Today's Date: 03/17/2013 Time: 1400-1430 PT Time Calculation (min): 30 min Charges: Evaluation: 1              Visit#: 1 of 8  Re-eval: 04/16/13 Assessment Diagnosis: Back Pain Next MD Visit: [place] - June 18th  Authorization: Medicare    Authorization Time Period:    Authorization Visit#: 1 of 10   Past Medical History:  Past Medical History  Diagnosis Date  . S/P colonoscopy 06/02/09    normal (Dr. Linna Darner)  . PUD (peptic ulcer disease) 01/2007    EGD Dr Jena Gauss, 2 antral ulcers, negative h pylori  . MRSA infection     Dr. Lenice Pressman, currently under treatment  . Hiatal hernia 2008    on EGD above  . Asthma   . PTSD (post-traumatic stress disorder)   . Migraines   . Degenerative disc disease   . Rectal prolapse   . SBO (small bowel obstruction)   . ADHD (attention deficit hyperactivity disorder)   . S/P endoscopy 03/26/11    gastritis-Dr Darrick Penna  . Clostridium difficile colitis 04/10/11 &11/2011    tx w/ flagyl  . Kidney stones   . Chronic abdominal pain 2003    EX LAP APR 2004 RUPTURE L OV CYST, JUL 2004 ADHESIONS  . Irritable bowel syndrome 2004 CONSTIPATION  . BMI (body mass index) 20.0-29.9 2009 127 LBS   Past Surgical History:  Past Surgical History  Procedure Laterality Date  . Bowel resection      x2, secondary to adhesions  . Appendectomy    . Laparoscopic lysis intestinal adhesions    . Back surgery    . Abdominal hysterectomy    . Tubal ligation    . Umbilical hernia repair  2008    x5  . Neck surgery  2009  . Partial hysterectomy    . Upper gastrointestinal endoscopy  APR 2008 [facility] BLEEDING/PAIN    PUD  . Upper gastrointestinal endoscopy  JUL 2012 [facility] PAIN    MILD GASTRITIS  . Esophagogastroduodenoscopy  03/23/2011     Normal esophagus without evidence of Barrett's, mass, erosions, or ulcerations./ Patchy erythema with occasional  erosion in the antrum.  Biopsies  obtained via cold forceps to evaluate for H. pylori gastritis/ Small hiatal hernia./Normal duodenal bulb and second portion of the duodenum.  . Cervical spine surgery    . Flexible sigmoidoscopy  07/14/2002    Normal limited flexible sigmoidoscopy with stool in the rectum and rectosigmoid precluding a full colonoscopy    Subjective Symptoms/Limitations Symptoms: Significant PMH: abdominal surgery to fix bowel prolapse, cervical surgery Pertinent History: Pt is referred to PT for chronic pain to her low back and neck pain.  She has a signficant hx of cervical fusions with Rt 5th digit difficulty.  She has a soft collar neck brace that helps decrease her neck pain.  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  Her c/co is pain and spasms causing increased headaches ( 1-2x/month), sitffnes neck everyday. How long can you sit comfortably?: 10 minutes  How long can you stand comfortably?: hard floors less than 10 minutes with low back pain.  How long can you walk comfortably?: no more than 1 mile. Patient Stated Goals: Lessen the stiffness, decrease pain and spasms in my back, improve strength and flexibility.  Pain Assessment Currently in Pain?: Yes  Pain Score:   8 Pain Location: Neck Pain Type: Chronic pain (throbbing pain) Pain Radiating Towards: legs and hands  Pain Onset: More than a month ago Pain Frequency: Constant Pain Relieving Factors: norco, tramadol, flexiril Effect of Pain on Daily Activities: increased pain to spine, difficulty lifting grocery bags, styling her hair Multiple Pain Sites: Yes  Balance Screening Balance Screen Has the patient fallen in the past 6 months: No Has the patient had a decrease in activity level because of a fear of falling? : No Is the patient reluctant to leave their home because of a fear of falling? : No  Prior Functioning  Home Living Lives With:  Alone  Sensation/Coordination/Flexibility/Functional Tests Functional Tests Functional Tests: Oswestry Low Back Pain Scale (ODI):   RUE Strength Right Shoulder Flexion: 3+/5 Right Shoulder Extension: 3+/5 Right Shoulder ABduction: 3+/5 Right Shoulder Internal Rotation: 3+/5 Right Shoulder External Rotation: 3+/5 LUE Strength Left Shoulder Flexion: 3+/5 Left Shoulder Extension: 3+/5 Left Shoulder ABduction: 3+/5 Left Shoulder Internal Rotation: 3+/5 Left Shoulder External Rotation: 3+/5 Cervical AROM Cervical Flexion: WNL Cervical Extension: Decreased 90% due to fusion Cervical - Right Side Bend: Decreased 50% Cervical - Left Side Bend: Decreased 50% Cervical - Right Rotation: Decreased: 25% Cervical - Left Rotation: Decreased 25% Cervical Strength Cervical Flexion: 3/5 Cervical Extension: 3/5 Cervical - Right Side Bend: 3/5 Cervical - Left Side Bend: 3/5 Cervical - Right Rotation: 3/5 Cervical - Left Rotation: 3/5  Palpation: significant pain and tenderness to upper thoracic and lower cervical region with signfiicant muscle spasms to rhomboids  Mobility/Balance  Posture/Postural Control Posture/Postural Control: Postural limitations Postural Limitations: slouched posture with upper cross syndrome   Exercise/Treatments Seated Exercises Neck Retraction: 5 reps Cervical Rotation: Both;5 reps Lateral Flexion: Both;5 reps Shoulder Rolls: Backwards;Forwards;10 reps  Physical Therapy Assessment and Plan PT Assessment and Plan Clinical Impression Statement: Pt is a 52 y.o female referred to PT for neck and back pain with following impairments listed below.  Pt will benefit from skilled therapeutic intervention in order to improve on the following deficits: Pain;Decreased strength;Decreased range of motion;Impaired perceived functional ability Rehab Potential: Fair Clinical Impairments Affecting Rehab Potential: chronic pain, co-morbilties PT Frequency: Min 2X/week PT  Duration: 4 weeks PT Treatment/Interventions: Functional mobility training;Therapeutic activities;Therapeutic exercise;Balance training;Neuromuscular re-education;Manual techniques PT Plan: manual techniques to address upper thoracic/lower cervical pain and tenderness.  POE activities, prone shoulder activities. PLEASE GIVE PT HEP, was left at evaluation, progress towards hand strengthening activities.  Progress to hand strengthening activities    Goals Home Exercise Program Pt will Perform Home Exercise Program: Independently PT Goal: Perform Home Exercise Program - Progress: Goal set today PT Short Term Goals Time to Complete Short Term Goals: 2 weeks PT Short Term Goal 1: Pt will report pain less than 6/10 to her neck  for 50% of her day for improved QOL.  PT Short Term Goal 2: Pt will improve her cervical AROM by 10% in lateral flexion and rotation for greater ease when looking out her window of the car.  PT Short Term Goal 3: Pt will improve UE strength by 1 muscle grade for greater ease with lifting grocery bags from the floor.  PT Long Term Goals Time to Complete Long Term Goals: 4 weeks PT Long Term Goal 1: Pt will decrease her ODI to less than 40% for improved QOL.  PT Long Term Goal 2: Pt will improve her B UE activity tolerance for greater ease with brushing her hair.  Long Term Goal 3: Pt  will decrease fascial restrictions to her thoracic and cervical spine for improved QOL.   Problem List Patient Active Problem List   Diagnosis Date Noted  . Cervical pain 03/17/2013  . Anal or rectal pain 05/15/2012  . RUQ pain 01/22/2012  . Diarrhea 11/22/2011  . C. difficile colitis 06/25/2011  . Chronic nausea 03/13/2011  . VOMITING 10/21/2009  . ABDOMINAL PAIN 10/21/2009  . DEPRESSION 05/09/2007  . ALLERGIC RHINITIS 05/09/2007  . ASTHMA 05/09/2007  . GERD 05/09/2007  . PEPTIC ULCER DISEASE 05/09/2007  . IBS 05/09/2007  . ARTHRITIS 05/09/2007  . HIP PAIN, LEFT 05/09/2007  . NECK  PAIN, CHRONIC 05/09/2007  . LOW BACK PAIN 05/09/2007  . CONSTIPATION, HX OF 05/09/2007  . HX, PERSONAL, PAST NONCOMPLIANCE 05/09/2007    General Behavior During Therapy: [facility] for tasks assessed/performed Cognition: [facility] for tasks performed PT Plan of Care PT Home Exercise Plan: see scanned report PT Patient Instructions: importance of HEP, posture and postural strength, expectations of possible increased pain with PT.  Consulted and Agree with Plan of Care: Patient  GP Functional Assessment Tool Used: ODI: 64% Functional Limitation: Self care Self Care Current Status (Z6109): At least 60 percent but less than 80 percent impaired, limited or restricted Self Care Goal Status (U0454): At least 40 percent but less than 60 percent impaired, limited or restricted  Jonerik Sliker, MPT ATC 03/17/2013, 4:22 PM  Physician Documentation Your signature is required to indicate approval of the treatment plan as stated above.  Please sign and either send electronically or make a copy of this report for your files and return this physician signed original.   Please mark one 1.__approve of plan  2. ___approve of plan with the following conditions.   ______________________________                                                          _____________________ Physician Signature                                                                                                             Date

## 2013-03-23 ENCOUNTER — Ambulatory Visit (HOSPITAL_COMMUNITY): Payer: PRIVATE HEALTH INSURANCE | Admitting: *Deleted

## 2013-03-26 ENCOUNTER — Ambulatory Visit (HOSPITAL_COMMUNITY)
Admission: RE | Admit: 2013-03-26 | Discharge: 2013-03-26 | Disposition: A | Discharge status: Home / Self Care | Payer: PRIVATE HEALTH INSURANCE | Source: Ambulatory Visit

## 2013-03-26 DIAGNOSIS — M545 Low back pain, unspecified: Secondary | ICD-10-CM

## 2013-03-26 DIAGNOSIS — M542 Cervicalgia: Secondary | ICD-10-CM

## 2013-03-26 NOTE — Progress Notes (Signed)
Physical Therapy Treatment Patient Details  Name: [name] MRN: [id number] Date of Birth: 07-Oct-1961  Today's Date: 03/26/2013 Time: 4098-1191 PT Time Calculation (min): 47 min Charge ;Therex 25' [phone]), Manual 22' [phone])  Visit#: 2 of 8  Re-eval: 04/16/13 Assessment Diagnosis: Back Pain Next MD Visit: [place] -   Authorization: Medicare  Authorization Time Period:    Authorization Visit#: 2 of 10   Subjective: Symptoms/Limitations Symptoms: Pt stated she is unsure she can be touched on Lt UT region.  Pt got shot in Lt UT for pressure point and reports like she feels like she has the flu in Lt upper trap muscle Pain Assessment Currently in Pain?: Yes Pain Score:   7 Pain Location: Neck Pain Orientation: Left;Upper;Mid  Objective:   Exercise/Treatments Seated Exercises Neck Retraction: 5 reps Cervical Rotation: Both;5 reps Lateral Flexion: Both;5 reps Shoulder Rolls: Backwards;Forwards;10 reps Postural Training: Posture awareness 10 minutes Prone Exercises Rows: 10 reps Other Prone Exercise: POE x 5 minutes  Manual Therapy Manual Therapy: Myofascial release Myofascial Release: MFR and soft tissue massage to thoracic, scapular and cervical region L>R in prone position to reduce spasms and fascial restrictions ([id number])  Physical Therapy Assessment and Plan PT Assessment and Plan Clinical Impression Statement: Instructed HEP for proper form and technique with min difficulty, did require cueing for posture awareness to reduce forward head slouched posture.  MFR and soft tissue massage complete to thoracic, scapular and cervical region to reduce fascial restrictions and spams.  Able to reduce by 30% following manual with improved cervical ROM, improved posture per pain reduced to 4/10.  Pt encouraged to drink extra water following manual to reduce risk of headaches.   PT Plan: manual techniques to address upper thoracic/lower cervical pain and  tenderness.  Continue with posture awareness and strengthening exercises, prone exercises and POE.. Progress towards hand strengthening activities.     Goals    Problem List Patient Active Problem List   Diagnosis Date Noted  . Cervical pain 03/17/2013  . Anal or rectal pain 05/15/2012  . RUQ pain 01/22/2012  . Diarrhea 11/22/2011  . C. difficile colitis 06/25/2011  . Chronic nausea 03/13/2011  . VOMITING 10/21/2009  . ABDOMINAL PAIN 10/21/2009  . DEPRESSION 05/09/2007  . ALLERGIC RHINITIS 05/09/2007  . ASTHMA 05/09/2007  . GERD 05/09/2007  . PEPTIC ULCER DISEASE 05/09/2007  . IBS 05/09/2007  . ARTHRITIS 05/09/2007  . HIP PAIN, LEFT 05/09/2007  . NECK PAIN, CHRONIC 05/09/2007  . LOW BACK PAIN 05/09/2007  . CONSTIPATION, HX OF 05/09/2007  . HX, PERSONAL, PAST NONCOMPLIANCE 05/09/2007    PT - End of Session Activity Tolerance: Patient tolerated treatment well;Patient limited by pain General Behavior During Therapy: [facility] for tasks assessed/performed Cognition: WFL for tasks performed  GP    Juel Burrow 03/26/2013, 6:42 PM

## 2013-03-30 ENCOUNTER — Inpatient Hospital Stay (HOSPITAL_COMMUNITY): Admission: RE | Admit: 2013-03-30 | Payer: PRIVATE HEALTH INSURANCE | Source: Ambulatory Visit | Admitting: *Deleted

## 2013-03-31 ENCOUNTER — Inpatient Hospital Stay (HOSPITAL_COMMUNITY): Admission: RE | Admit: 2013-03-31 | Payer: PRIVATE HEALTH INSURANCE | Source: Ambulatory Visit

## 2013-04-02 ENCOUNTER — Inpatient Hospital Stay (HOSPITAL_COMMUNITY): Admission: RE | Admit: 2013-04-02 | Payer: PRIVATE HEALTH INSURANCE | Source: Ambulatory Visit

## 2013-04-06 ENCOUNTER — Ambulatory Visit (HOSPITAL_COMMUNITY): Payer: PRIVATE HEALTH INSURANCE | Admitting: Physical Therapy

## 2013-04-09 ENCOUNTER — Ambulatory Visit (HOSPITAL_COMMUNITY): Payer: PRIVATE HEALTH INSURANCE | Admitting: Physical Therapy

## 2013-04-11 ENCOUNTER — Encounter (HOSPITAL_COMMUNITY): Payer: Self-pay | Admitting: *Deleted

## 2013-04-11 ENCOUNTER — Emergency Department (HOSPITAL_COMMUNITY): Payer: PRIVATE HEALTH INSURANCE

## 2013-04-11 ENCOUNTER — Emergency Department (HOSPITAL_COMMUNITY)
Admission: EM | Admit: 2013-04-11 | Discharge: 2013-04-11 | Disposition: A | Discharge status: Home / Self Care | Payer: PRIVATE HEALTH INSURANCE | Attending: Emergency Medicine | Admitting: Emergency Medicine

## 2013-04-11 DIAGNOSIS — Z9851 Tubal ligation status: Secondary | ICD-10-CM | POA: Insufficient documentation

## 2013-04-11 DIAGNOSIS — R109 Unspecified abdominal pain: Secondary | ICD-10-CM | POA: Insufficient documentation

## 2013-04-11 DIAGNOSIS — I739 Peripheral vascular disease, unspecified: Secondary | ICD-10-CM | POA: Insufficient documentation

## 2013-04-11 DIAGNOSIS — Z9089 Acquired absence of other organs: Secondary | ICD-10-CM | POA: Insufficient documentation

## 2013-04-11 DIAGNOSIS — J45909 Unspecified asthma, uncomplicated: Secondary | ICD-10-CM | POA: Insufficient documentation

## 2013-04-11 DIAGNOSIS — Z8739 Personal history of other diseases of the musculoskeletal system and connective tissue: Secondary | ICD-10-CM | POA: Insufficient documentation

## 2013-04-11 DIAGNOSIS — Z87442 Personal history of urinary calculi: Secondary | ICD-10-CM | POA: Insufficient documentation

## 2013-04-11 DIAGNOSIS — Z880 Allergy status to penicillin: Secondary | ICD-10-CM | POA: Insufficient documentation

## 2013-04-11 DIAGNOSIS — F17200 Nicotine dependence, unspecified, uncomplicated: Secondary | ICD-10-CM | POA: Insufficient documentation

## 2013-04-11 DIAGNOSIS — Z8719 Personal history of other diseases of the digestive system: Secondary | ICD-10-CM | POA: Insufficient documentation

## 2013-04-11 DIAGNOSIS — F909 Attention-deficit hyperactivity disorder, unspecified type: Secondary | ICD-10-CM | POA: Insufficient documentation

## 2013-04-11 DIAGNOSIS — Z79899 Other long term (current) drug therapy: Secondary | ICD-10-CM | POA: Insufficient documentation

## 2013-04-11 DIAGNOSIS — G8929 Other chronic pain: Secondary | ICD-10-CM | POA: Insufficient documentation

## 2013-04-11 DIAGNOSIS — Z9889 Other specified postprocedural states: Secondary | ICD-10-CM | POA: Insufficient documentation

## 2013-04-11 DIAGNOSIS — F4310 Post-traumatic stress disorder, unspecified: Secondary | ICD-10-CM | POA: Insufficient documentation

## 2013-04-11 DIAGNOSIS — Z8619 Personal history of other infectious and parasitic diseases: Secondary | ICD-10-CM | POA: Insufficient documentation

## 2013-04-11 DIAGNOSIS — Z8614 Personal history of Methicillin resistant Staphylococcus aureus infection: Secondary | ICD-10-CM | POA: Insufficient documentation

## 2013-04-11 DIAGNOSIS — Z90710 Acquired absence of both cervix and uterus: Secondary | ICD-10-CM | POA: Insufficient documentation

## 2013-04-11 LAB — CBC WITH DIFFERENTIAL/PLATELET
Basophils Relative: 1 % (ref 0–1)
Eosinophils Absolute: 0.2 10*3/uL (ref 0.0–0.7)
Eosinophils Relative: 3 % (ref 0–5)
HCT: 38 % (ref 36.0–46.0)
Hemoglobin: 12.8 g/dL (ref 12.0–15.0)
Lymphs Abs: 1.9 10*3/uL (ref 0.7–4.0)
MCH: 31 pg (ref 26.0–34.0)
MCHC: 33.7 g/dL (ref 30.0–36.0)
MCV: 92 fL (ref 78.0–100.0)
Monocytes Absolute: 0.7 10*3/uL (ref 0.1–1.0)
Monocytes Relative: 9 % (ref 3–12)
Neutrophils Relative %: 62 % (ref 43–77)

## 2013-04-11 LAB — HEPATIC FUNCTION PANEL
Bilirubin, Direct: <0.1 mg/dL (ref 0.0–0.3)
Total Bilirubin: 0.1 mg/dL — ABNORMAL LOW (ref 0.3–1.2)

## 2013-04-11 LAB — URINALYSIS, ROUTINE W REFLEX MICROSCOPIC
Bilirubin Urine: NEGATIVE
Hgb urine dipstick: NEGATIVE
Ketones, ur: NEGATIVE mg/dL
Nitrite: NEGATIVE
pH: 6 (ref 5.0–8.0)

## 2013-04-11 LAB — BASIC METABOLIC PANEL
BUN: 13 mg/dL (ref 6–23)
Calcium: 9.2 mg/dL (ref 8.4–10.5)
Creatinine, Ser: 0.67 mg/dL (ref 0.50–1.10)
GFR calc Af Amer: >90 mL/min (ref >90)
GFR calc non Af Amer: >90 mL/min (ref >90)
Glucose, Bld: 104 mg/dL — ABNORMAL HIGH (ref 70–99)

## 2013-04-11 MED ORDER — ONDANSETRON HCL 4 MG/2ML IJ SOLN
4.0000 mg | Freq: Once | INTRAMUSCULAR | Status: AC
Start: 2013-04-11 — End: 2013-04-11
  Administered 2013-04-11: 4 mg via INTRAVENOUS
  Filled 2013-04-11: qty 2

## 2013-04-11 MED ORDER — HYDROMORPHONE HCL PF 1 MG/ML IJ SOLN
1.0000 mg | Freq: Once | INTRAMUSCULAR | Status: AC
  Administered 2013-04-11: 1 mg via INTRAVENOUS
  Filled 2013-04-11: qty 1

## 2013-04-11 MED ORDER — PROMETHAZINE HCL 25 MG PO TABS: 25.0000 mg | ORAL_TABLET | Freq: Four times a day (QID) | ORAL | Status: DC | PRN

## 2013-04-11 NOTE — ED Provider Notes (Signed)
History    CSN: [id number] Arrival date & time 04/11/13  1608  First MD Initiated Contact with Patient 04/11/13 1636     Chief Complaint  Patient presents with  . Abdominal Pain   (Consider location/radiation/quality/duration/timing/severity/associated sxs/prior Treatment) Patient is a 52 y.o. female presenting with abdominal pain. The history is provided by the patient (pt complains of abd cramps and some loose stools).  Abdominal Pain This is a new problem. The current episode started 12 to 24 hours ago. The problem occurs constantly. The problem has not changed since onset.Associated symptoms include abdominal pain. Pertinent negatives include no chest pain and no headaches. Nothing aggravates the symptoms. Nothing relieves the symptoms.   Past Medical History  Diagnosis Date  . S/P colonoscopy 06/02/09    normal (Dr. Linna Darner)  . PUD (peptic ulcer disease) 01/2007    EGD Dr Jena Gauss, 2 antral ulcers, negative h pylori  . MRSA infection     Dr. Lenice Pressman, currently under treatment  . Hiatal hernia 2008    on EGD above  . Asthma   . PTSD (post-traumatic stress disorder)   . Migraines   . Degenerative disc disease   . Rectal prolapse   . SBO (small bowel obstruction)   . ADHD (attention deficit hyperactivity disorder)   . S/P endoscopy 03/26/11    gastritis-Dr Darrick Penna  . Clostridium difficile colitis 04/10/11 &11/2011    tx w/ flagyl  . Kidney stones   . Chronic abdominal pain 2003    EX LAP APR 2004 RUPTURE L OV CYST, JUL 2004 ADHESIONS  . Irritable bowel syndrome 2004 CONSTIPATION  . BMI (body mass index) 20.0-29.9 2009 127 LBS   Past Surgical History  Procedure Laterality Date  . Bowel resection      x2, secondary to adhesions  . Appendectomy    . Laparoscopic lysis intestinal adhesions    . Back surgery    . Abdominal hysterectomy    . Tubal ligation    . Umbilical hernia repair  2008    x5  . Neck surgery  2009  . Partial hysterectomy    . Upper  gastrointestinal endoscopy  APR 2008 [facility] BLEEDING/PAIN    PUD  . Upper gastrointestinal endoscopy  JUL 2012 [facility] PAIN    MILD GASTRITIS  . Esophagogastroduodenoscopy  03/23/2011     Normal esophagus without evidence of Barrett's, mass, erosions, or ulcerations./ Patchy erythema with occasional erosion in the antrum.  Biopsies  obtained via cold forceps to evaluate for H. pylori gastritis/ Small hiatal hernia./Normal duodenal bulb and second portion of the duodenum.  . Cervical spine surgery    . Flexible sigmoidoscopy  07/14/2002    Normal limited flexible sigmoidoscopy with stool in the rectum and rectosigmoid precluding a full colonoscopy   Family History  Problem Relation Age of Onset  . Adopted: Yes   History  Substance Use Topics  . Smoking status: Current Every Day Smoker -- 1.00 packs/day for 1 years    Types: Cigarettes  . Smokeless tobacco: Former Neurosurgeon  . Alcohol Use: No   OB History   Grav Para Term Preterm Abortions TAB SAB Ect Mult Living   1 1 1       1      Review of Systems  Constitutional: Negative for appetite change and fatigue.  HENT: Negative for congestion, sinus pressure and ear discharge.   Eyes: Negative for discharge.  Respiratory: Negative for cough.   Cardiovascular: Negative for chest pain.  Gastrointestinal: Positive for  abdominal pain. Negative for diarrhea.  Genitourinary: Negative for frequency and hematuria.  Musculoskeletal: Negative for back pain.  Skin: Negative for rash.  Neurological: Negative for seizures and headaches.  Psychiatric/Behavioral: Negative for hallucinations.    Allergies  Penicillins; Sulfa antibiotics; Aspirin; Ibuprofen; Lactaid; and Morphine and related  Home Medications   Current Outpatient Rx  Name  Route  Sig  Dispense  Refill  . albuterol (PROVENTIL,VENTOLIN) 90 MCG/ACT inhaler   Inhalation   Inhale 2 puffs into the lungs every 6 (six) hours as needed. For shortness of breath         . ALPRAZolam (XANAX) 1  MG tablet   Oral   Take 1-2 mg by mouth 3 (three) times daily. Patient takes 1 tablet twice a day and 2 tablets at bedtime         . amphetamine-dextroamphetamine (ADDERALL XR) 15 MG 24 hr capsule   Oral   Take 15 mg by mouth 2 (two) times daily. As needed         . cyclobenzaprine (FLEXERIL) 5 MG tablet   Oral   Take 5 mg by mouth every 8 (eight) hours as needed for muscle spasms.         Marland Kitchen dexlansoprazole (DEXILANT) 60 MG capsule   Oral   Take 60 mg by mouth every morning.          . dicyclomine (BENTYL) 10 MG capsule   Oral   Take 10 mg by mouth 3 (three) times daily.         . DULoxetine (CYMBALTA) 60 MG capsule   Oral   Take 120 mg by mouth every morning.          . famotidine (PEPCID) 20 MG tablet   Oral   Take 20 mg by mouth 3 (three) times daily.         Marland Kitchen gabapentin (NEURONTIN) 300 MG capsule   Oral   Take 300-900 mg by mouth 2 (two) times daily. Patient takes 300mg  in the morning and 900mg  at night         . HYDROcodone-acetaminophen (NORCO) 10-325 MG per tablet   Oral   Take 1 tablet by mouth 4 (four) times daily.         . Lactobacillus (ACIDOPHILUS) CAPS   Oral   Take 2 capsules by mouth daily.          Marland Kitchen lidocaine (LIDODERM) 5 %   Transdermal   Place 1 patch onto the skin daily as needed. Uses for pain in back. Not used regularly.         . montelukast (SINGULAIR) 10 MG tablet   Oral   Take 10 mg by mouth daily.          . Multiple Vitamin (MULTIVITAMIN WITH MINERALS) TABS   Oral   Take 1 tablet by mouth daily.         Marland Kitchen topiramate (TOPAMAX) 25 MG tablet   Oral   Take 25 mg by mouth 2 (two) times daily.          . traMADol (ULTRAM) 50 MG tablet   Oral   Take 50 mg by mouth every 8 (eight) hours as needed. For pain         . promethazine (PHENERGAN) 25 MG tablet   Oral   Take 1 tablet (25 mg total) by mouth every 6 (six) hours as needed for nausea.   20 tablet   0   . zolpidem (AMBIEN CR)  12.5 MG CR tablet    Oral   Take 12.5 mg by mouth at bedtime as needed. For sleep          BP 110/66  Pulse 87  Temp(Src) 98.1 F (36.7 C) (Oral)  Resp 16  Ht 5\' 5"  (1.651 m)  Wt 140 lb (63.504 kg)  BMI 23.3 kg/m2  SpO2 100% Physical Exam  Constitutional: She is oriented to person, place, and time. She appears well-developed.  HENT:  Head: Normocephalic.  Eyes: Conjunctivae and EOM are normal. No scleral icterus.  Neck: Neck supple. No thyromegaly present.  Cardiovascular: Normal rate and regular rhythm.  Exam reveals no gallop and no friction rub.   No murmur heard. Pulmonary/Chest: No stridor. She has no wheezes. She has no rales. She exhibits no tenderness.  Abdominal: She exhibits no distension. There is tenderness. There is no rebound.  Distended abdomen  Musculoskeletal: Normal range of motion. She exhibits no edema.  Lymphadenopathy:    She has no cervical adenopathy.  Neurological: She is oriented to person, place, and time. Coordination normal.  Skin: No rash noted. No erythema.  Psychiatric: She has a normal mood and affect. Her behavior is normal.    ED Course  Procedures (including critical care time) Labs Reviewed  URINALYSIS, ROUTINE W REFLEX MICROSCOPIC - Abnormal; Notable for the following:    Specific Gravity, Urine <1.005 (*)    All other components within normal limits  BASIC METABOLIC PANEL - Abnormal; Notable for the following:    Glucose, Bld 104 (*)    All other components within normal limits  HEPATIC FUNCTION PANEL - Abnormal; Notable for the following:    Total Bilirubin 0.1 (*)    All other components within normal limits  CBC WITH DIFFERENTIAL   Dg Abd Acute W/chest  04/11/2013   *RADIOLOGY REPORT*  Clinical Data: Lower abdominal pain for 3 days, history of hysterectomy, appendectomy, bowel resection, diverticulosis, peptic ulcer disease, hiatal hernia, irritable bowel syndrome  ACUTE ABDOMEN SERIES (ABDOMEN 2 VIEW & CHEST 1 VIEW)  Comparison: 01/14/2013   Findings: Normal heart size, mediastinal contours, and pulmonary vascularity. Severe emphysematous changes consistent with COPD. Minimal chronic peribronchial thickening. Chronic subsegmental atelectasis versus scarring at the left base. Minimal right upper lobe scarring. No acute infiltrate, pleural effusion or pneumothorax. Prior cervical and lumbar spine fusions. Prior ventral hernia repair. Nonobstructive bowel gas pattern. No bowel dilatation, bowel wall thickening, or free intraperitoneal air. No urinary tract calcification.  IMPRESSION: COPD changes with atelectasis versus scarring as above. Nonobstructive bowel gas pattern.   Original Report Authenticated By: Ulyses Southward, M.D.   1. Abdominal  pain, other specified site     MDM    [name], MD 04/11/13 [phone]

## 2013-04-11 NOTE — ED Notes (Signed)
nad noted prior to dc. Dc instructions reviewed and explained. 1 script given along with f/u care. Pt voiced understanding.

## 2013-04-11 NOTE — ED Notes (Signed)
Dental pain began last night 

## 2013-04-11 NOTE — ED Notes (Signed)
Pain across lower abdomen x 3 days after eating berries. Diarrhea. Nausea. States abdomen seems swollen.

## 2013-04-13 ENCOUNTER — Ambulatory Visit (HOSPITAL_COMMUNITY): Payer: PRIVATE HEALTH INSURANCE | Admitting: Physical Therapy

## 2013-04-16 ENCOUNTER — Inpatient Hospital Stay (HOSPITAL_COMMUNITY)
Admission: RE | Admit: 2013-04-16 | Payer: PRIVATE HEALTH INSURANCE | Source: Ambulatory Visit | Admitting: Physical Therapy

## 2013-06-08 ENCOUNTER — Emergency Department (HOSPITAL_COMMUNITY): Payer: PRIVATE HEALTH INSURANCE

## 2013-06-08 ENCOUNTER — Encounter (HOSPITAL_COMMUNITY): Payer: Self-pay

## 2013-06-08 ENCOUNTER — Inpatient Hospital Stay (HOSPITAL_COMMUNITY)
Admission: EM | Admit: 2013-06-08 | Discharge: 2013-06-10 | DRG: 191 | Disposition: A | Discharge status: Home / Self Care | Payer: PRIVATE HEALTH INSURANCE | Attending: Family Medicine | Admitting: Family Medicine

## 2013-06-08 DIAGNOSIS — I252 Old myocardial infarction: Secondary | ICD-10-CM

## 2013-06-08 DIAGNOSIS — I2510 Atherosclerotic heart disease of native coronary artery without angina pectoris: Secondary | ICD-10-CM | POA: Diagnosis present

## 2013-06-08 DIAGNOSIS — M62838 Other muscle spasm: Secondary | ICD-10-CM

## 2013-06-08 DIAGNOSIS — F909 Attention-deficit hyperactivity disorder, unspecified type: Secondary | ICD-10-CM | POA: Diagnosis present

## 2013-06-08 DIAGNOSIS — Z880 Allergy status to penicillin: Secondary | ICD-10-CM

## 2013-06-08 DIAGNOSIS — Z885 Allergy status to narcotic agent status: Secondary | ICD-10-CM

## 2013-06-08 DIAGNOSIS — IMO0002 Reserved for concepts with insufficient information to code with codable children: Secondary | ICD-10-CM | POA: Diagnosis present

## 2013-06-08 DIAGNOSIS — Z882 Allergy status to sulfonamides status: Secondary | ICD-10-CM

## 2013-06-08 DIAGNOSIS — Z886 Allergy status to analgesic agent status: Secondary | ICD-10-CM

## 2013-06-08 DIAGNOSIS — J441 Chronic obstructive pulmonary disease with (acute) exacerbation: Principal | ICD-10-CM | POA: Diagnosis present

## 2013-06-08 DIAGNOSIS — Z8711 Personal history of peptic ulcer disease: Secondary | ICD-10-CM

## 2013-06-08 DIAGNOSIS — Z90710 Acquired absence of both cervix and uterus: Secondary | ICD-10-CM

## 2013-06-08 DIAGNOSIS — F17200 Nicotine dependence, unspecified, uncomplicated: Secondary | ICD-10-CM | POA: Diagnosis present

## 2013-06-08 DIAGNOSIS — J209 Acute bronchitis, unspecified: Secondary | ICD-10-CM | POA: Diagnosis present

## 2013-06-08 DIAGNOSIS — J440 Chronic obstructive pulmonary disease with acute lower respiratory infection: Secondary | ICD-10-CM | POA: Diagnosis present

## 2013-06-08 DIAGNOSIS — K219 Gastro-esophageal reflux disease without esophagitis: Secondary | ICD-10-CM | POA: Diagnosis present

## 2013-06-08 DIAGNOSIS — F4310 Post-traumatic stress disorder, unspecified: Secondary | ICD-10-CM | POA: Diagnosis present

## 2013-06-08 DIAGNOSIS — F411 Generalized anxiety disorder: Secondary | ICD-10-CM | POA: Diagnosis present

## 2013-06-08 DIAGNOSIS — Z79899 Other long term (current) drug therapy: Secondary | ICD-10-CM

## 2013-06-08 DIAGNOSIS — M542 Cervicalgia: Secondary | ICD-10-CM | POA: Diagnosis present

## 2013-06-08 DIAGNOSIS — G43709 Chronic migraine without aura, not intractable, without status migrainosus: Secondary | ICD-10-CM | POA: Diagnosis present

## 2013-06-08 DIAGNOSIS — E876 Hypokalemia: Secondary | ICD-10-CM

## 2013-06-08 DIAGNOSIS — K589 Irritable bowel syndrome without diarrhea: Secondary | ICD-10-CM | POA: Diagnosis present

## 2013-06-08 HISTORY — DX: Nausea: R11.0

## 2013-06-08 HISTORY — DX: Angina pectoris, unspecified: I20.9

## 2013-06-08 HISTORY — DX: Chronic obstructive pulmonary disease, unspecified: J44.9

## 2013-06-08 LAB — CBC WITH DIFFERENTIAL/PLATELET
Basophils Relative: 0 % (ref 0–1)
Eosinophils Absolute: 0.2 10*3/uL (ref 0.0–0.7)
Hemoglobin: 11 g/dL — ABNORMAL LOW (ref 12.0–15.0)
Lymphs Abs: 1.4 10*3/uL (ref 0.7–4.0)
MCH: 30.4 pg (ref 26.0–34.0)
MCHC: 32.3 g/dL (ref 30.0–36.0)
Monocytes Relative: 6 % (ref 3–12)
Neutro Abs: 7.7 10*3/uL (ref 1.7–7.7)
Neutrophils Relative %: 78 % — ABNORMAL HIGH (ref 43–77)
Platelets: 249 10*3/uL (ref 150–400)
RBC: 3.62 MIL/uL — ABNORMAL LOW (ref 3.87–5.11)

## 2013-06-08 LAB — BASIC METABOLIC PANEL
BUN: 14 mg/dL (ref 6–23)
Chloride: 108 mEq/L (ref 96–112)
GFR calc Af Amer: >90 mL/min (ref >90)
GFR calc non Af Amer: >90 mL/min (ref >90)
Potassium: 2.9 mEq/L — ABNORMAL LOW (ref 3.5–5.1)
Sodium: 142 mEq/L (ref 135–145)

## 2013-06-08 MED ORDER — ALBUTEROL SULFATE (5 MG/ML) 0.5% IN NEBU
2.5000 mg | INHALATION_SOLUTION | RESPIRATORY_TRACT | Status: DC
Start: 2013-06-08 — End: 2013-06-08

## 2013-06-08 MED ORDER — ALBUTEROL (5 MG/ML) CONTINUOUS INHALATION SOLN
10.0000 mg/h | INHALATION_SOLUTION | Freq: Once | RESPIRATORY_TRACT | Status: AC
  Administered 2013-06-08: 10 mg/h via RESPIRATORY_TRACT
  Filled 2013-06-08: qty 20

## 2013-06-08 MED ORDER — PREDNISONE 50 MG PO TABS
60.0000 mg | ORAL_TABLET | Freq: Once | ORAL | Status: AC
  Administered 2013-06-08: 60 mg via ORAL
  Filled 2013-06-08: qty 1

## 2013-06-08 MED ORDER — OXYCODONE-ACETAMINOPHEN 5-325 MG PO TABS
1.0000 | ORAL_TABLET | Freq: Once | ORAL | Status: AC
  Administered 2013-06-08: 1 via ORAL
  Filled 2013-06-08: qty 1

## 2013-06-08 MED ORDER — NICOTINE 21 MG/24HR TD PT24
21.0000 mg | MEDICATED_PATCH | Freq: Once | TRANSDERMAL | Status: AC
  Administered 2013-06-08: 21 mg via TRANSDERMAL
  Filled 2013-06-08: qty 1

## 2013-06-08 MED ORDER — IPRATROPIUM BROMIDE 0.02 % IN SOLN
1.0000 mg | Freq: Once | RESPIRATORY_TRACT | Status: AC
  Administered 2013-06-08: 1 mg via RESPIRATORY_TRACT
  Filled 2013-06-08: qty 5

## 2013-06-08 MED ORDER — IPRATROPIUM BROMIDE 0.02 % IN SOLN: 0.5000 mg | RESPIRATORY_TRACT | Status: DC

## 2013-06-08 NOTE — ED Notes (Signed)
C/o "muscle spasms" in abd. Pt tremulous with one hour neb in progress

## 2013-06-08 NOTE — ED Provider Notes (Signed)
CSN: [id number]     Arrival date & time 06/08/13  2005 History   First MD Initiated Contact with Patient 06/08/13 2019     Chief Complaint  Patient presents with  . Shortness of Breath   HPI Pt was seen at 2025.  Per pt, c/o gradual onset and worsening of persistent cough, wheezing and SOB for the past several days.  Describes her symptoms as "my asthma is acting up."  Describes her cough as productive of "brown" sputum. Has been using home MDI with transient relief. States she received further relief of her symptoms after EMS gave neb en route to the ED. Endorses she continues to smoke cigarettes.  Denies CP/palpitations, no back pain, no abd pain, no N/V/D, no fevers, no rash. Pt states her coughing has caused an acute flair of her chronic migraine headache and is requesting "something for pain."  Describes the headache as per her usual chronic migraine headache pain pattern for many years.  Denies headache was sudden or maximal in onset or at any time.  Denies visual changes, no focal motor weakness, no tingling/numbness in extremities.      Past Medical History  Diagnosis Date  . S/P colonoscopy 06/02/09    normal (Dr. Linna Darner)  . PUD (peptic ulcer disease) 01/2007    EGD Dr Jena Gauss, 2 antral ulcers, negative h pylori  . MRSA infection     Dr. Lenice Pressman, currently under treatment  . Hiatal hernia 2008    on EGD above  . Asthma   . PTSD (post-traumatic stress disorder)   . Migraines   . Degenerative disc disease   . Rectal prolapse   . SBO (small bowel obstruction)   . ADHD (attention deficit hyperactivity disorder)   . S/P endoscopy 03/26/11    gastritis-Dr Darrick Penna  . Clostridium difficile colitis 04/10/11 &11/2011    tx w/ flagyl  . Kidney stones   . Chronic abdominal pain 2003    EX LAP APR 2004 RUPTURE L OV CYST, JUL 2004 ADHESIONS  . Irritable bowel syndrome 2004 CONSTIPATION  . BMI (body mass index) 20.0-29.9 2009 127 LBS  . COPD (chronic obstructive pulmonary disease)   .  Chronic nausea    Past Surgical History  Procedure Laterality Date  . Bowel resection      x2, secondary to adhesions  . Appendectomy    . Laparoscopic lysis intestinal adhesions    . Back surgery    . Abdominal hysterectomy    . Tubal ligation    . Umbilical hernia repair  2008    x5  . Neck surgery  2009  . Partial hysterectomy    . Upper gastrointestinal endoscopy  APR 2008 [facility] BLEEDING/PAIN    PUD  . Upper gastrointestinal endoscopy  JUL 2012 [facility] PAIN    MILD GASTRITIS  . Esophagogastroduodenoscopy  03/23/2011     Normal esophagus without evidence of Barrett's, mass, erosions, or ulcerations./ Patchy erythema with occasional erosion in the antrum.  Biopsies  obtained via cold forceps to evaluate for H. pylori gastritis/ Small hiatal hernia./Normal duodenal bulb and second portion of the duodenum.  . Cervical spine surgery    . Flexible sigmoidoscopy  07/14/2002    Normal limited flexible sigmoidoscopy with stool in the rectum and rectosigmoid precluding a full colonoscopy   Family History  Problem Relation Age of Onset  . Adopted: Yes   History  Substance Use Topics  . Smoking status: Current Every Day Smoker -- 1.00 packs/day for 1 years  Types: Cigarettes  . Smokeless tobacco: Former Neurosurgeon  . Alcohol Use: No   OB History   Grav Para Term Preterm Abortions TAB SAB Ect Mult Living   1 1 1       1      Review of Systems ROS: Statement: All systems negative except as marked or noted in the HPI; Constitutional: Negative for fever and chills. ; ; Eyes: Negative for eye pain, redness and discharge. ; ; ENMT: Negative for ear pain, hoarseness, nasal congestion, sinus pressure and sore throat. ; ; Cardiovascular: Negative for chest pain, palpitations, diaphoresis and peripheral edema. ; ; Respiratory: +cough, wheezing, SOB. Negative for stridor. ; ; Gastrointestinal: Negative for nausea, vomiting, diarrhea, abdominal pain, blood in stool, hematemesis, jaundice and rectal  bleeding. . ; ; Genitourinary: Negative for dysuria, flank pain and hematuria. ; ; Musculoskeletal: Negative for back pain and neck pain. Negative for swelling and trauma.; ; Skin: Negative for pruritus, rash, abrasions, blisters, bruising and skin lesion.; ; Neuro: +headache. Negative for lightheadedness and neck stiffness. Negative for weakness, altered level of consciousness , altered mental status, extremity weakness, paresthesias, involuntary movement, seizure and syncope.       Allergies  Penicillins; Sulfa antibiotics; Aspirin; Ibuprofen; Lactaid; and Morphine and related  Home Medications   Current Outpatient Rx  Name  Route  Sig  Dispense  Refill  . albuterol (PROVENTIL,VENTOLIN) 90 MCG/ACT inhaler   Inhalation   Inhale 2 puffs into the lungs every 6 (six) hours as needed. For shortness of breath         . ALPRAZolam (XANAX) 1 MG tablet   Oral   Take 1-2 mg by mouth 3 (three) times daily. Patient takes 1 tablet twice a day and 2 tablets at bedtime         . amphetamine-dextroamphetamine (ADDERALL XR) 15 MG 24 hr capsule   Oral   Take 15 mg by mouth 2 (two) times daily. As needed         . cyclobenzaprine (FLEXERIL) 5 MG tablet   Oral   Take 5 mg by mouth every 8 (eight) hours as needed for muscle spasms.         Marland Kitchen dexlansoprazole (DEXILANT) 60 MG capsule   Oral   Take 60 mg by mouth every morning.          . dicyclomine (BENTYL) 10 MG capsule   Oral   Take 10 mg by mouth 3 (three) times daily.         . DULoxetine (CYMBALTA) 60 MG capsule   Oral   Take 120 mg by mouth every morning.          . famotidine (PEPCID) 20 MG tablet   Oral   Take 20 mg by mouth 3 (three) times daily.         Marland Kitchen gabapentin (NEURONTIN) 300 MG capsule   Oral   Take 300-900 mg by mouth 2 (two) times daily. 1 capsule in the morning and 3 capsules at bedtime         . HYDROcodone-acetaminophen (NORCO) 10-325 MG per tablet   Oral   Take 1 tablet by mouth 4 (four) times  daily.         . Lactobacillus (ACIDOPHILUS) CAPS   Oral   Take 2 capsules by mouth daily.          Marland Kitchen lidocaine (LIDODERM) 5 %   Transdermal   Place 1 patch onto the skin daily as needed. Uses for pain  in back. Not used regularly.         . montelukast (SINGULAIR) 10 MG tablet   Oral   Take 10 mg by mouth daily.          . Multiple Vitamin (MULTIVITAMIN WITH MINERALS) TABS   Oral   Take 1 tablet by mouth daily.         Marland Kitchen topiramate (TOPAMAX) 25 MG tablet   Oral   Take 25 mg by mouth 3 (three) times daily.         . traMADol (ULTRAM) 50 MG tablet   Oral   Take 50 mg by mouth every 8 (eight) hours as needed. For pain         . zolpidem (AMBIEN CR) 12.5 MG CR tablet   Oral   Take 12.5 mg by mouth at bedtime as needed. For sleep          BP 106/65  Pulse 97  Temp(Src) 98.8 F (37.1 C) (Oral)  Resp 18  Ht 5\' 5"  (1.651 m)  Wt 134 lb (60.782 kg)  BMI 22.3 kg/m2  SpO2 93% Physical Exam 2030: Physical examination:  Nursing notes reviewed; Vital signs and O2 SAT reviewed;  Constitutional: Well developed, Well nourished, Well hydrated, Uncomfortable appearing; Head:  Normocephalic, atraumatic; Eyes: EOMI, PERRL, No scleral icterus; ENMT: Mouth and pharynx normal, Mucous membranes moist; Neck: Supple, Full range of motion, No lymphadenopathy; Cardiovascular: Regular rate and rhythm, No gallop; Respiratory: Breath sounds diminished & equal bilaterally, scattered insp/exp wheezes. No audible wheezing. Speaking full sentences. Normal respiratory effort/excursion. No retrax or access mm use.; Chest: Nontender, Movement normal; Abdomen: Soft, Nontender, Nondistended, Normal bowel sounds; Genitourinary: No CVA tenderness; Extremities: Pulses normal, No tenderness, No edema, No calf edema or asymmetry.; Neuro: AA&Ox3, Major CN grossly intact.  Speech clear. No gross focal motor or sensory deficits in extremities.; Skin: Color normal, Warm, Dry.   ED Course  Procedures    2035:  Pt with insp/exp wheezing bilat. No audible wheezing. Sats 93% R/A. Will start continuous neb and dose PO prednisone.   2200:  Neb completed. Pt continues with O2 Sats 91-93% R/A at rest. Lungs diminished with insp/exp wheezes bilat. +audible wheezing with coughing. Will check labs and admit. Pt requesting a nicotine patch and "something more for pain."  Has hx chronic pain. Will dose meds. Dx and testing d/w pt and family.  Questions answered.  Verb understanding, agreeable to admit.  2340: T/C to [facility] Dr. Sharl Ma, case discussed, including:  HPI, pertinent PM/SHx, VS/PE, dx testing, ED course and treatment:  Agreeable to observation admit, requests to write temporary orders, obtain medical bed to team 1.    MDM  MDM Reviewed: previous chart, nursing note and vitals Reviewed previous: x-ray and labs Interpretation: x-ray and labs Total time providing critical care: 30-74 minutes. This excludes time spent performing separately reportable procedures and services. Consults: admitting MD   CRITICAL CARE Performed by: Laray Anger Total critical care time: 40 Critical care time was exclusive of separately billable procedures and treating other patients. Critical care was necessary to treat or prevent imminent or life-threatening deterioration. Critical care was time spent personally by me on the following activities: development of treatment plan with patient and/or surrogate as well as nursing, discussions with consultants, evaluation of patient's response to treatment, examination of patient, obtaining history from patient or surrogate, ordering and performing treatments and interventions, ordering and review of laboratory studies, ordering and review of radiographic studies, pulse oximetry and re-evaluation  of patient's condition.    Dg Chest 2 View 06/08/2013   *RADIOLOGY REPORT*  Clinical Data: Shortness of breath and cough  CHEST - 2 VIEW  Comparison: Acute abdominal series  04/11/2013  Findings: Marked emphysematous changes noted.  Lungs are hyperinflated with attenuation of lung markings in the upper lung fields.  No airspace disease or pleural effusion.  Stable scarring at the lateral left lung base.  Negative for airspace disease, pneumothorax, or pleural effusion.  Postsurgical changes of the lower cervical spine.  No acute osseous abnormality identified.  IMPRESSION: Advanced emphysema.  No acute findings identified.   Original Report Authenticated By: Britta Mccreedy, M.D.     Results for orders placed during the hospital encounter of 06/08/13  CBC WITH DIFFERENTIAL      Result Value Range   WBC 9.9  4.0 - 10.5 K/uL   RBC 3.62 (*) 3.87 - 5.11 MIL/uL   Hemoglobin 11.0 (*) 12.0 - 15.0 g/dL   HCT 09.8 (*) 11.9 - 14.7 %   MCV 94.2  78.0 - 100.0 fL   MCH 30.4  26.0 - 34.0 pg   MCHC 32.3  30.0 - 36.0 g/dL   RDW 82.9  56.2 - 13.0 %   Platelets 249  150 - 400 K/uL   Neutrophils Relative % 78 (*) 43 - 77 %   Neutro Abs 7.7  1.7 - 7.7 K/uL   Lymphocytes Relative 14  12 - 46 %   Lymphs Abs 1.4  0.7 - 4.0 K/uL   Monocytes Relative 6  3 - 12 %   Monocytes Absolute 0.6  0.1 - 1.0 K/uL   Eosinophils Relative 2  0 - 5 %   Eosinophils Absolute 0.2  0.0 - 0.7 K/uL   Basophils Relative 0  0 - 1 %   Basophils Absolute 0.0  0.0 - 0.1 K/uL  BASIC METABOLIC PANEL      Result Value Range   Sodium 142  135 - 145 mEq/L   Potassium 2.9 (*) 3.5 - 5.1 mEq/L   Chloride 108  96 - 112 mEq/L   CO2 24  19 - 32 mEq/L   Glucose, Bld 142 (*) 70 - 99 mg/dL   BUN 14  6 - 23 mg/dL   Creatinine, Ser 8.65  0.50 - 1.10 mg/dL   Calcium 9.1  8.4 - 78.4 mg/dL   GFR calc non Af Amer >90  >90 mL/min   GFR calc Af Amer >90  >90 mL/min   Dg Chest 2 View 06/08/2013   *RADIOLOGY REPORT*  Clinical Data: Shortness of breath and cough  CHEST - 2 VIEW  Comparison: Acute abdominal series 04/11/2013  Findings: Marked emphysematous changes noted.  Lungs are hyperinflated with attenuation of lung markings  in the upper lung fields.  No airspace disease or pleural effusion.  Stable scarring at the lateral left lung base.  Negative for airspace disease, pneumothorax, or pleural effusion.  Postsurgical changes of the lower cervical spine.  No acute osseous abnormality identified.  IMPRESSION: Advanced emphysema.  No acute findings identified.   Original Report Authenticated By: Britta Mccreedy, M.D.    Results for TREASE, BREMNER (MRN [id number]) as of 06/08/2013 23:51  Ref. Range 02/06/2012 19:46 03/16/2012 09:24 01/14/2013 18:21 04/11/2013 17:05 06/08/2013 22:23  Hemoglobin Latest Range: 12.0-15.0 g/dL 13.2 44.0 10.2 (L) 72.5 11.0 (L)  HCT Latest Range: 36.0-46.0 % 37.8 36.9 35.7 (L) 38.0 34.1 (L)      Laray Anger, DO 06/10/13 1856

## 2013-06-08 NOTE — ED Notes (Signed)
Woke today with increased sob and cough. No relief with OTC meds

## 2013-06-09 ENCOUNTER — Encounter (HOSPITAL_COMMUNITY): Payer: Self-pay | Admitting: *Deleted

## 2013-06-09 DIAGNOSIS — J441 Chronic obstructive pulmonary disease with (acute) exacerbation: Secondary | ICD-10-CM

## 2013-06-09 DIAGNOSIS — M62838 Other muscle spasm: Secondary | ICD-10-CM

## 2013-06-09 DIAGNOSIS — E876 Hypokalemia: Secondary | ICD-10-CM

## 2013-06-09 DIAGNOSIS — J209 Acute bronchitis, unspecified: Secondary | ICD-10-CM

## 2013-06-09 DIAGNOSIS — K219 Gastro-esophageal reflux disease without esophagitis: Secondary | ICD-10-CM

## 2013-06-09 DIAGNOSIS — M542 Cervicalgia: Secondary | ICD-10-CM

## 2013-06-09 LAB — CBC
Platelets: 279 10*3/uL (ref 150–400)
RBC: 3.71 MIL/uL — ABNORMAL LOW (ref 3.87–5.11)
RDW: 13.8 % (ref 11.5–15.5)
WBC: 7.8 10*3/uL (ref 4.0–10.5)

## 2013-06-09 LAB — COMPREHENSIVE METABOLIC PANEL
ALT: 28 U/L (ref 0–35)
AST: 38 U/L — ABNORMAL HIGH (ref 0–37)
Albumin: 3.6 g/dL (ref 3.5–5.2)
Alkaline Phosphatase: 75 U/L (ref 39–117)
Chloride: 107 mEq/L (ref 96–112)
Creatinine, Ser: 0.52 mg/dL (ref 0.50–1.10)
Potassium: 4 mEq/L (ref 3.5–5.1)
Sodium: 139 mEq/L (ref 135–145)
Total Bilirubin: 0.2 mg/dL — ABNORMAL LOW (ref 0.3–1.2)

## 2013-06-09 LAB — TROPONIN I: Troponin I: <0.3 ng/mL (ref <0.30)

## 2013-06-09 MED ORDER — ALPRAZOLAM 1 MG PO TABS
1.0000 mg | ORAL_TABLET | Freq: Two times a day (BID) | ORAL | Status: DC
  Administered 2013-06-09 – 2013-06-10 (×4): 1 mg via ORAL
  Filled 2013-06-09 (×3): qty 1

## 2013-06-09 MED ORDER — FAMOTIDINE 20 MG PO TABS
20.0000 mg | ORAL_TABLET | Freq: Two times a day (BID) | ORAL | Status: DC
  Administered 2013-06-09 – 2013-06-10 (×4): 20 mg via ORAL
  Filled 2013-06-09 (×4): qty 1

## 2013-06-09 MED ORDER — LEVOFLOXACIN IN D5W 750 MG/150ML IV SOLN
750.0000 mg | Freq: Once | INTRAVENOUS | Status: AC
  Administered 2013-06-09: 750 mg via INTRAVENOUS
  Filled 2013-06-09: qty 150

## 2013-06-09 MED ORDER — HYDROCODONE-ACETAMINOPHEN 10-325 MG PO TABS
1.0000 | ORAL_TABLET | Freq: Four times a day (QID) | ORAL | Status: DC
  Administered 2013-06-09 – 2013-06-10 (×6): 1 via ORAL
  Filled 2013-06-09 (×7): qty 1

## 2013-06-09 MED ORDER — GUAIFENESIN-DM 100-10 MG/5ML PO SYRP
5.0000 mL | ORAL_SOLUTION | ORAL | Status: DC | PRN
  Administered 2013-06-09 – 2013-06-10 (×3): 5 mL via ORAL
  Filled 2013-06-09 (×3): qty 5

## 2013-06-09 MED ORDER — ENOXAPARIN SODIUM 40 MG/0.4ML ~~LOC~~ SOLN
40.0000 mg | SUBCUTANEOUS | Status: DC
  Administered 2013-06-09 – 2013-06-10 (×2): 40 mg via SUBCUTANEOUS
  Filled 2013-06-09 (×3): qty 0.4

## 2013-06-09 MED ORDER — ALBUTEROL SULFATE (5 MG/ML) 0.5% IN NEBU: 2.5000 mg | INHALATION_SOLUTION | RESPIRATORY_TRACT | Status: DC

## 2013-06-09 MED ORDER — IPRATROPIUM BROMIDE 0.02 % IN SOLN
0.5000 mg | Freq: Three times a day (TID) | RESPIRATORY_TRACT | Status: DC
  Administered 2013-06-09 – 2013-06-10 (×3): 0.5 mg via RESPIRATORY_TRACT
  Filled 2013-06-09 (×3): qty 2.5

## 2013-06-09 MED ORDER — PREDNISONE 20 MG PO TABS
50.0000 mg | ORAL_TABLET | Freq: Every day | ORAL | Status: DC
  Administered 2013-06-09 – 2013-06-10 (×3): 50 mg via ORAL
  Filled 2013-06-09 (×3): qty 2

## 2013-06-09 MED ORDER — SODIUM CHLORIDE 0.9 % IJ SOLN
3.0000 mL | Freq: Two times a day (BID) | INTRAMUSCULAR | Status: DC
  Administered 2013-06-09 – 2013-06-10 (×3): 3 mL via INTRAVENOUS
  Filled 2013-06-09: qty 3

## 2013-06-09 MED ORDER — DULOXETINE HCL 60 MG PO CPEP
120.0000 mg | ORAL_CAPSULE | Freq: Every day | ORAL | Status: DC
Start: 2013-06-09 — End: 2013-06-10
  Administered 2013-06-09 – 2013-06-10 (×2): 120 mg via ORAL
  Filled 2013-06-09 (×2): qty 2

## 2013-06-09 MED ORDER — SODIUM CHLORIDE 0.9 % IJ SOLN
3.0000 mL | INTRAMUSCULAR | Status: DC | PRN
  Administered 2013-06-09: 3 mL via INTRAVENOUS

## 2013-06-09 MED ORDER — ALBUTEROL SULFATE (5 MG/ML) 0.5% IN NEBU: 2.5000 mg | INHALATION_SOLUTION | RESPIRATORY_TRACT | Status: DC | PRN

## 2013-06-09 MED ORDER — ADULT MULTIVITAMIN W/MINERALS CH
1.0000 | ORAL_TABLET | Freq: Every day | ORAL | Status: DC
  Administered 2013-06-09 – 2013-06-10 (×2): 1 via ORAL
  Filled 2013-06-09 (×2): qty 1

## 2013-06-09 MED ORDER — ALBUTEROL SULFATE (5 MG/ML) 0.5% IN NEBU
2.5000 mg | INHALATION_SOLUTION | Freq: Four times a day (QID) | RESPIRATORY_TRACT | Status: DC
  Administered 2013-06-09 (×2): 2.5 mg via RESPIRATORY_TRACT
  Filled 2013-06-09 (×2): qty 0.5

## 2013-06-09 MED ORDER — GABAPENTIN 300 MG PO CAPS
300.0000 mg | ORAL_CAPSULE | Freq: Every day | ORAL | Status: DC
  Administered 2013-06-09 – 2013-06-10 (×2): 300 mg via ORAL
  Filled 2013-06-09 (×2): qty 1

## 2013-06-09 MED ORDER — HYDROCODONE-ACETAMINOPHEN 5-325 MG PO TABS
1.0000 | ORAL_TABLET | ORAL | Status: DC | PRN
  Administered 2013-06-09: 2 via ORAL
  Administered 2013-06-10: 1 via ORAL
  Filled 2013-06-09: qty 1
  Filled 2013-06-09: qty 2

## 2013-06-09 MED ORDER — DICYCLOMINE HCL 10 MG PO CAPS
10.0000 mg | ORAL_CAPSULE | Freq: Three times a day (TID) | ORAL | Status: DC
Start: 2013-06-09 — End: 2013-06-10
  Administered 2013-06-09 – 2013-06-10 (×5): 10 mg via ORAL
  Filled 2013-06-09 (×5): qty 1

## 2013-06-09 MED ORDER — SODIUM CHLORIDE 0.9 % IV SOLN: INTRAVENOUS | Status: DC

## 2013-06-09 MED ORDER — ZOLPIDEM TARTRATE 5 MG PO TABS
5.0000 mg | ORAL_TABLET | Freq: Every evening | ORAL | Status: DC | PRN
  Administered 2013-06-09: 5 mg via ORAL
  Filled 2013-06-09: qty 1

## 2013-06-09 MED ORDER — AMPHETAMINE-DEXTROAMPHET ER 15 MG PO CP24
15.0000 mg | ORAL_CAPSULE | Freq: Two times a day (BID) | ORAL | Status: DC
  Administered 2013-06-10 (×2): 15 mg via ORAL

## 2013-06-09 MED ORDER — SODIUM CHLORIDE 0.9 % IV SOLN
250.0000 mL | INTRAVENOUS | Status: DC | PRN
  Administered 2013-06-09: 250 mL via INTRAVENOUS

## 2013-06-09 MED ORDER — PANTOPRAZOLE SODIUM 40 MG PO TBEC
40.0000 mg | DELAYED_RELEASE_TABLET | Freq: Every day | ORAL | Status: DC
  Administered 2013-06-09 – 2013-06-10 (×2): 40 mg via ORAL
  Filled 2013-06-09 (×3): qty 1

## 2013-06-09 MED ORDER — ALBUTEROL SULFATE (5 MG/ML) 0.5% IN NEBU
2.5000 mg | INHALATION_SOLUTION | Freq: Three times a day (TID) | RESPIRATORY_TRACT | Status: DC
  Administered 2013-06-09 – 2013-06-10 (×3): 2.5 mg via RESPIRATORY_TRACT
  Filled 2013-06-09 (×3): qty 0.5

## 2013-06-09 MED ORDER — GABAPENTIN 300 MG PO CAPS
900.0000 mg | ORAL_CAPSULE | Freq: Every day | ORAL | Status: DC
  Administered 2013-06-09 (×2): 900 mg via ORAL
  Filled 2013-06-09 (×2): qty 3

## 2013-06-09 MED ORDER — TOPIRAMATE 25 MG PO TABS
25.0000 mg | ORAL_TABLET | Freq: Three times a day (TID) | ORAL | Status: DC
  Administered 2013-06-09 – 2013-06-10 (×5): 25 mg via ORAL
  Filled 2013-06-09 (×11): qty 1

## 2013-06-09 MED ORDER — BACLOFEN 10 MG PO TABS
5.0000 mg | ORAL_TABLET | Freq: Three times a day (TID) | ORAL | Status: DC
  Administered 2013-06-09 – 2013-06-10 (×5): 5 mg via ORAL
  Filled 2013-06-09 (×5): qty 1

## 2013-06-09 MED ORDER — IPRATROPIUM BROMIDE 0.02 % IN SOLN
0.5000 mg | Freq: Four times a day (QID) | RESPIRATORY_TRACT | Status: DC
  Administered 2013-06-09 (×2): 0.5 mg via RESPIRATORY_TRACT
  Filled 2013-06-09 (×2): qty 2.5

## 2013-06-09 MED ORDER — LEVOFLOXACIN IN D5W 750 MG/150ML IV SOLN
750.0000 mg | INTRAVENOUS | Status: DC
  Administered 2013-06-09: 750 mg via INTRAVENOUS
  Filled 2013-06-09 (×2): qty 150

## 2013-06-09 MED ORDER — ALPRAZOLAM 1 MG PO TABS
2.0000 mg | ORAL_TABLET | Freq: Every day | ORAL | Status: DC
  Administered 2013-06-09 (×2): 2 mg via ORAL
  Filled 2013-06-09 (×3): qty 2

## 2013-06-09 MED ORDER — POTASSIUM CHLORIDE CRYS ER 20 MEQ PO TBCR
40.0000 meq | EXTENDED_RELEASE_TABLET | ORAL | Status: AC
  Administered 2013-06-09 (×2): 40 meq via ORAL
  Filled 2013-06-09 (×2): qty 2

## 2013-06-09 NOTE — Progress Notes (Signed)
UR Chart Review Completed  

## 2013-06-09 NOTE — Care Management Note (Unsigned)
    Page 1 of 1   06/09/2013     4:53:06 PM   CARE MANAGEMENT NOTE 06/09/2013  Patient:  [name], [name]   Account Number:  [id number]  Date Initiated:  06/09/2013  Documentation initiated by:  Anibal Henderson  Subjective/Objective Assessment:   Admitted withbronchitis/COPD. Pt is from home, is independent, and will return home     Action/Plan:   No CM needs identified. Will continue to follow   Anticipated DC Date:  06/10/2013   Anticipated DC Plan:  HOME/SELF CARE      DC Planning Services  CM consult      Choice offered to / List presented to:             Status of service:  In process, will continue to follow Medicare Important Message given?   (If response is "NO", the following Medicare IM given date fields will be blank) Date Medicare IM given:   Date Additional Medicare IM given:    Discharge Disposition:    Per UR Regulation:  Reviewed for med. necessity/level of care/duration of stay  If discussed at Long Length of Stay Meetings, dates discussed:    Comments:  06/09/13 1100 Anibal Henderson RN/CM

## 2013-06-09 NOTE — H&P (Signed)
PCP:   [name],CATHERINE, MD   Chief Complaint:  Shortness of breath  HPI: 52 year old female with a history of asthma who came to the ED today with worsening shortness of breath nasal congestion upper respiratory symptoms which started this morning. Patient also had some chest pain which was burning in quality associated with the shortness of breath. Patient says that she took albuterol inhaler which did not help the breathing. In the ED patient was given nebulizer treatments had some improvement and then again O2 sats dropped less than 90, and she continued to have wheezing. She denies fever no nausea vomiting or diarrhea.  Allergies:   Allergies  Allergen Reactions  . Penicillins Anaphylaxis    REACTION: anaphylaxis  . Sulfa Antibiotics Itching  . Aspirin Other (See Comments)    Burning of stomach. REACTION: GI upset  . Ibuprofen Nausea Only    Upset stomach due to acid reflux  . Lactaid [Lactase] Diarrhea and Other (See Comments)    Stomach cramping  . Morphine And Related Itching      Past Medical History  Diagnosis Date  . S/P colonoscopy 06/02/09    normal (Dr. Linna Darner)  . PUD (peptic ulcer disease) 01/2007    EGD Dr Jena Gauss, 2 antral ulcers, negative h pylori  . MRSA infection     Dr. Lenice Pressman, currently under treatment  . Hiatal hernia 2008    on EGD above  . Asthma   . PTSD (post-traumatic stress disorder)   . Migraines   . Degenerative disc disease   . Rectal prolapse   . SBO (small bowel obstruction)   . ADHD (attention deficit hyperactivity disorder)   . S/P endoscopy 03/26/11    gastritis-Dr Darrick Penna  . Clostridium difficile colitis 04/10/11 &11/2011    tx w/ flagyl  . Kidney stones   . Chronic abdominal pain 2003    EX LAP APR 2004 RUPTURE L OV CYST, JUL 2004 ADHESIONS  . Irritable bowel syndrome 2004 CONSTIPATION  . BMI (body mass index) 20.0-29.9 2009 127 LBS  . COPD (chronic obstructive pulmonary disease)   . Chronic nausea   . Medical history  non-contributory   . Coronary artery disease   . Myocardial infarction   . Anginal pain     Past Surgical History  Procedure Laterality Date  . Bowel resection      x2, secondary to adhesions  . Appendectomy    . Laparoscopic lysis intestinal adhesions    . Back surgery    . Abdominal hysterectomy    . Tubal ligation    . Umbilical hernia repair  2008    x5  . Neck surgery  2009  . Partial hysterectomy    . Upper gastrointestinal endoscopy  APR 2008 [facility] BLEEDING/PAIN    PUD  . Upper gastrointestinal endoscopy  JUL 2012 [facility] PAIN    MILD GASTRITIS  . Esophagogastroduodenoscopy  03/23/2011     Normal esophagus without evidence of Barrett's, mass, erosions, or ulcerations./ Patchy erythema with occasional erosion in the antrum.  Biopsies  obtained via cold forceps to evaluate for H. pylori gastritis/ Small hiatal hernia./Normal duodenal bulb and second portion of the duodenum.  . Cervical spine surgery    . Flexible sigmoidoscopy  07/14/2002    Normal limited flexible sigmoidoscopy with stool in the rectum and rectosigmoid precluding a full colonoscopy    Prior to Admission medications   Medication Sig Start Date End Date Taking? Authorizing Provider  albuterol (PROVENTIL,VENTOLIN) 90 MCG/ACT inhaler Inhale 2 puffs into  the lungs every 6 (six) hours as needed. For shortness of breath   Yes Historical Provider, MD  ALPRAZolam Prudy Feeler) 1 MG tablet Take 1-2 mg by mouth 3 (three) times daily. Patient takes 1 tablet twice a day and 2 tablets at bedtime   Yes Historical Provider, MD  amphetamine-dextroamphetamine (ADDERALL XR) 15 MG 24 hr capsule Take 15 mg by mouth 2 (two) times daily. As needed   Yes Historical Provider, MD  cyclobenzaprine (FLEXERIL) 5 MG tablet Take 5 mg by mouth every 8 (eight) hours as needed for muscle spasms.   Yes Historical Provider, MD  dexlansoprazole (DEXILANT) 60 MG capsule Take 60 mg by mouth every morning.    Yes Historical Provider, MD  dicyclomine (BENTYL)  10 MG capsule Take 10 mg by mouth 3 (three) times daily.   Yes Historical Provider, MD  DULoxetine (CYMBALTA) 60 MG capsule Take 120 mg by mouth every morning.    Yes Historical Provider, MD  famotidine (PEPCID) 20 MG tablet Take 20 mg by mouth 3 (three) times daily.   Yes Historical Provider, MD  gabapentin (NEURONTIN) 300 MG capsule Take 300-900 mg by mouth 2 (two) times daily. 1 capsule in the morning and 3 capsules at bedtime   Yes Historical Provider, MD  HYDROcodone-acetaminophen (NORCO) 10-325 MG per tablet Take 1 tablet by mouth 4 (four) times daily.   Yes Historical Provider, MD  Lactobacillus (ACIDOPHILUS) CAPS Take 2 capsules by mouth daily.    Yes Historical Provider, MD  lidocaine (LIDODERM) 5 % Place 1 patch onto the skin daily as needed. Uses for pain in back. Not used regularly.   Yes Historical Provider, MD  montelukast (SINGULAIR) 10 MG tablet Take 10 mg by mouth daily.  01/14/12  Yes Historical Provider, MD  Multiple Vitamin (MULTIVITAMIN WITH MINERALS) TABS Take 1 tablet by mouth daily.   Yes Historical Provider, MD  topiramate (TOPAMAX) 25 MG tablet Take 25 mg by mouth 3 (three) times daily.   Yes Historical Provider, MD  traMADol (ULTRAM) 50 MG tablet Take 50 mg by mouth every 8 (eight) hours as needed. For pain 02/29/12  Yes Historical Provider, MD  zolpidem (AMBIEN CR) 12.5 MG CR tablet Take 12.5 mg by mouth at bedtime as needed. For sleep 03/11/12  Yes Historical Provider, MD    Social History:  reports that she has been smoking Cigarettes.  She has a 1 pack-year smoking history. She has quit using smokeless tobacco. She reports that she does not drink alcohol or use illicit drugs.  Family History  Problem Relation Age of Onset  . Adopted: Yes     All the positives are listed in BOLD  Review of Systems:  HEENT: Headache, blurred vision, runny nose, sore throat Neck: Hypothyroidism, hyperthyroidism,,lymphadenopathy Chest : Shortness of breath, history of COPD,  Asthma Heart : Chest pain, history of coronary arterey disease GI:  Nausea, vomiting, diarrhea, constipation, GERD GU: Dysuria, urgency, frequency of urination, hematuria Neuro: Stroke, seizures, syncope Psych: Depression, anxiety, hallucinations   Physical Exam: Blood pressure 104/59, pulse 77, temperature 98.8 F (37.1 C), temperature source Oral, resp. rate 16, height 5\' 5"  (1.651 m), weight 60.782 kg (134 lb), SpO2 94.00%. Constitutional:   Patient is a well-developed and well-nourished female in no acute distress and cooperative with exam. Head: Normocephalic and atraumatic Mouth: Mucus membranes moist Eyes: PERRL, EOMI, conjunctivae normal Neck: Supple, No Thyromegaly Cardiovascular: RRR, S1 normal, S2 normal Pulmonary/Chest: Bilateral wheezing Abdominal: Soft. Non-tender, non-distended, bowel sounds are normal, no masses, organomegaly,  or guarding present.  Neurological: A&O x3, Strenght is normal and symmetric bilaterally, cranial nerve II-XII are grossly intact, no focal motor deficit, sensory intact to light touch bilaterally.  Extremities : No Cyanosis, Clubbing or Edema   Labs on Admission:  Results for orders placed during the hospital encounter of 06/08/13 (from the past 48 hour(s))  CBC WITH DIFFERENTIAL     Status: Abnormal   Collection Time    06/08/13 10:23 PM      Result Value Range   WBC 9.9  4.0 - 10.5 K/uL   RBC 3.62 (*) 3.87 - 5.11 MIL/uL   Hemoglobin 11.0 (*) 12.0 - 15.0 g/dL   HCT 16.1 (*) 09.6 - 04.5 %   MCV 94.2  78.0 - 100.0 fL   MCH 30.4  26.0 - 34.0 pg   MCHC 32.3  30.0 - 36.0 g/dL   RDW 40.9  81.1 - 91.4 %   Platelets 249  150 - 400 K/uL   Neutrophils Relative % 78 (*) 43 - 77 %   Neutro Abs 7.7  1.7 - 7.7 K/uL   Lymphocytes Relative 14  12 - 46 %   Lymphs Abs 1.4  0.7 - 4.0 K/uL   Monocytes Relative 6  3 - 12 %   Monocytes Absolute 0.6  0.1 - 1.0 K/uL   Eosinophils Relative 2  0 - 5 %   Eosinophils Absolute 0.2  0.0 - 0.7 K/uL   Basophils  Relative 0  0 - 1 %   Basophils Absolute 0.0  0.0 - 0.1 K/uL  BASIC METABOLIC PANEL     Status: Abnormal   Collection Time    06/08/13 10:23 PM      Result Value Range   Sodium 142  135 - 145 mEq/L   Potassium 2.9 (*) 3.5 - 5.1 mEq/L   Chloride 108  96 - 112 mEq/L   CO2 24  19 - 32 mEq/L   Glucose, Bld 142 (*) 70 - 99 mg/dL   BUN 14  6 - 23 mg/dL   Creatinine, Ser 7.82  0.50 - 1.10 mg/dL   Calcium 9.1  8.4 - 95.6 mg/dL   GFR calc non Af Amer >90  >90 mL/min   GFR calc Af Amer >90  >90 mL/min   Comment: (NOTE)     The eGFR has been calculated using the CKD EPI equation.     This calculation has not been validated in all clinical situations.     eGFR's persistently <90 mL/min signify possible Chronic Kidney     Disease.    Radiological Exams on Admission: Dg Chest 2 View  06/08/2013   *RADIOLOGY REPORT*  Clinical Data: Shortness of breath and cough  CHEST - 2 VIEW  Comparison: Acute abdominal series 04/11/2013  Findings: Marked emphysematous changes noted.  Lungs are hyperinflated with attenuation of lung markings in the upper lung fields.  No airspace disease or pleural effusion.  Stable scarring at the lateral left lung base.  Negative for airspace disease, pneumothorax, or pleural effusion.  Postsurgical changes of the lower cervical spine.  No acute osseous abnormality identified.  IMPRESSION: Advanced emphysema.  No acute findings identified.   Original Report Authenticated By: Britta Mccreedy, M.D.    Assessment/Plan Active Problems:   GERD   NECK PAIN, CHRONIC   Acute bronchitis   Hypokalemia   Muscle spasms of neck  Acute bronchitis Patient has symptoms of acute bronchitis, will start her on Levaquin Prednisone 50 mg daily DuoNeb  nebulizers every 6 hours Albuterol every 2 hours when necessary  Hypokalemia Replace potassium and check BMP in the morning  GERD Continue PPIs  Neck muscle spasm Patient has chronic neck muscle spasms due to chronic neck pain, has been  taking Flexeril which has been making her drowsy. Will try baclofen 5 mg 3 times a day  History of migraines Continue with Topamax  Chronic neck pain Continue Neurontin, Vicodin when necessary.   DVT prophylaxis Lovenox   Code status: Full code  Family discussion: Discussed with patient in detail no family at bedside   Time Spent on Admission: 60 min  Devoiry Corriher S [facility] Pager: [phone] 06/09/2013, 1:49 AM  If 7PM-7AM, please contact night-coverage  [email or web address]  Password [email or web address]

## 2013-06-09 NOTE — Progress Notes (Signed)
ANTIBIOTIC CONSULT NOTE - INITIAL  Pharmacy Consult for Levaquin Indication: COPD exacerbation  Allergies  Allergen Reactions  . Penicillins Anaphylaxis    REACTION: anaphylaxis  . Sulfa Antibiotics Itching  . Aspirin Other (See Comments)    Burning of stomach. REACTION: GI upset  . Ibuprofen Nausea Only    Upset stomach due to acid reflux  . Lactaid [Lactase] Diarrhea and Other (See Comments)    Stomach cramping  . Morphine And Related Itching    Patient Measurements: Height: 5\' 5"  (165.1 cm) Weight: 138 lb 7.2 oz (62.8 kg) IBW/kg (Calculated) : 57  Vital Signs: Temp: 98 F (36.7 C) (09/02 0500) Temp src: Oral (09/02 0500) BP: 115/72 mmHg (09/02 0500) Pulse Rate: 87 (09/02 0500) Intake/Output from previous day: 09/01 0701 - 09/02 0700 In: 150 [IV Piggyback:150] Out: -  Intake/Output from this shift:    Labs:  Recent Labs  06/08/13 2223 06/09/13 0557  WBC 9.9 7.8  HGB 11.0* 11.2*  PLT 249 279  CREATININE 0.59 0.52   Estimated Creatinine Clearance: 73.2 ml/min (by C-G formula based on Cr of 0.52). No results found for this basename: VANCOTROUGH, VANCOPEAK, VANCORANDOM, GENTTROUGH, GENTPEAK, GENTRANDOM, TOBRATROUGH, TOBRAPEAK, TOBRARND, AMIKACINPEAK, AMIKACINTROU, AMIKACIN,  in the last 72 hours   Microbiology: No results found for this or any previous visit (from the past 720 hour(s)).  Medical History: Past Medical History  Diagnosis Date  . S/P colonoscopy 06/02/09    normal (Dr. Linna Darner)  . PUD (peptic ulcer disease) 01/2007    EGD Dr Jena Gauss, 2 antral ulcers, negative h pylori  . MRSA infection     Dr. Lenice Pressman, currently under treatment  . Hiatal hernia 2008    on EGD above  . Asthma   . PTSD (post-traumatic stress disorder)   . Migraines   . Degenerative disc disease   . Rectal prolapse   . SBO (small bowel obstruction)   . ADHD (attention deficit hyperactivity disorder)   . S/P endoscopy 03/26/11    gastritis-Dr Darrick Penna  . Clostridium  difficile colitis 04/10/11 &11/2011    tx w/ flagyl  . Kidney stones   . Chronic abdominal pain 2003    EX LAP APR 2004 RUPTURE L OV CYST, JUL 2004 ADHESIONS  . Irritable bowel syndrome 2004 CONSTIPATION  . BMI (body mass index) 20.0-29.9 2009 127 LBS  . COPD (chronic obstructive pulmonary disease)   . Chronic nausea   . Medical history non-contributory   . Coronary artery disease   . Myocardial infarction   . Anginal pain     Medications:  Scheduled:  . albuterol  2.5 mg Nebulization Q6H  . ALPRAZolam  1 mg Oral BID  . ALPRAZolam  2 mg Oral QHS  . amphetamine-dextroamphetamine  15 mg Oral BID  . baclofen  5 mg Oral TID  . dicyclomine  10 mg Oral TID  . DULoxetine  120 mg Oral QAC breakfast  . enoxaparin (LOVENOX) injection  40 mg Subcutaneous Q24H  . famotidine  20 mg Oral BID  . gabapentin  300 mg Oral Daily  . gabapentin  900 mg Oral QHS  . HYDROcodone-acetaminophen  1 tablet Oral QID  . ipratropium  0.5 mg Nebulization Q6H  . multivitamin with minerals  1 tablet Oral Daily  . nicotine  21 mg Transdermal Once  . pantoprazole  40 mg Oral Daily  . predniSONE  50 mg Oral Q breakfast  . sodium chloride  3 mL Intravenous Q12H  . topiramate  25 mg Oral  TID   Assessment: 52 yo F admitted with acute bronchitis. She is afebrile with normal WBC.  Renal function is at baseline.   Levaquin  9/2>>  Goal of Therapy:  Eradicate infection.  Plan:  Levaquin 750mg  IV q24h Monitor renal function and cx data  Duration of therapy per MD- recommended x 5 days.   Elson Clan 06/09/2013,7:49 AM

## 2013-06-09 NOTE — Progress Notes (Signed)
Patient admitted to the hospital earlier this morning by Dr. Sharl Ma.  Patient seen and examined.  The patient is a smoker who has been admitted with a COPD/bronchitis exacerbation. She's been started on steroids and antibiotics. She's continuing on nebulizer treatments. Her wheezing although present is mild. I anticipate she should be able to discharge home in the next 24 hours  MEMON,JEHANZEB

## 2013-06-10 MED ORDER — LEVOFLOXACIN 750 MG PO TABS: 750.0000 mg | ORAL_TABLET | Freq: Every day | ORAL | Status: DC

## 2013-06-10 MED ORDER — DOXYCYCLINE HYCLATE 100 MG PO TABS: 100.0000 mg | ORAL_TABLET | Freq: Two times a day (BID) | ORAL | Status: DC

## 2013-06-10 MED ORDER — PREDNISONE 10 MG PO TABS: ORAL_TABLET | ORAL | Status: DC

## 2013-06-10 MED ORDER — TIOTROPIUM BROMIDE MONOHYDRATE 18 MCG IN CAPS: 18.0000 ug | ORAL_CAPSULE | Freq: Every day | RESPIRATORY_TRACT | Status: DC

## 2013-06-10 MED ORDER — NICOTINE 21 MG/24HR TD PT24: 1.0000 | MEDICATED_PATCH | Freq: Every day | TRANSDERMAL | Status: DC

## 2013-06-10 MED ORDER — NICOTINE 21 MG/24HR TD PT24
21.0000 mg | MEDICATED_PATCH | Freq: Every day | TRANSDERMAL | Status: DC
  Administered 2013-06-10: 21 mg via TRANSDERMAL
  Filled 2013-06-10: qty 1

## 2013-06-10 MED ORDER — TIOTROPIUM BROMIDE MONOHYDRATE 18 MCG IN CAPS
18.0000 ug | ORAL_CAPSULE | Freq: Every day | RESPIRATORY_TRACT | Status: DC
  Administered 2013-06-10: 18 ug via RESPIRATORY_TRACT
  Filled 2013-06-10: qty 5

## 2013-06-10 NOTE — Discharge Summary (Addendum)
Physician Discharge Summary  [name] ZOX:[id number] DOB: 1960/12/08 DOA: 06/08/2013  PCP: Chelsea Showers, MD  Admit date: 06/08/2013 Discharge date: 06/10/2013  Recommendations for Outpatient Follow-up:  1. Resolution of acute bronchitis, COPD exacerbation 2. Ongoing chronic management of severe COPD 3. Continue to encourage smoking cessation 4. Mild elevation of AST, significance unclear, consider repeat testing in the outpatient setting   Follow-up Information   Follow up with [facility], MD In 1 week.   Specialty:  Internal Medicine   Contact information:   [address] [place] [address] [place] [address] [phone]      Discharge Diagnoses:  1. Acute bronchitis, COPD exacerbation 2. Hypokalemia 3. Ongoing cigarette use 4. Mild elevation of AST  Discharge Condition:  improved Disposition: Home  Diet recommendation: Regular  Filed Weights   06/08/13 2005 06/09/13 0223  Weight: 60.782 kg (134 lb) 62.8 kg (138 lb 7.2 oz)    History of present illness:  52 year old woman with history of asthma presented emergency department with worsening shortness of breath. She was admitted for acute bronchitis, asthma exacerbation.  Hospital Course:  [name] was admitted for COPD exacerbation. She rapidly improved with steroids, nebulizers and antibiotics. Plan to wean oxygen today, stable for discharge home. Hospitalization uncomplicated. Anxiety does appear to play a role. I encouraged smoking cessation.  1. Acute bronchitis, asthma/COPD exacerbation: Good air movement. Plan discharge home on oral steroids, antibiotics. Add Spiriva. Continue albuterol. She has some pleuritic pain which persists but no chest pain. Serial troponin negative. 2. Hypokalemia: Resolved. 3. Mild elevation of AST: Significance unclear. Followup as an outpatient. 4. History of peptic ulcer disease, hiatal hernia 5. History of hiatal hernia 6. Ongoing cigarette smoker: She is committed to  stopping smoking.  Consultants:  None Procedures:  None Antibiotics:  Levaquin 9/2 >> 9/6   Discharge Instructions  Discharge Orders   Future Orders Complete By Expires   Activity as tolerated - No restrictions  As directed    Diet general  As directed    Discharge instructions  As directed    Comments:     You have been prescribed prednisone to decrease inflammation in your lungs, antibiotics for bronchitis. Spiriva has been started for daily maintenance therapy. Stop smoking! Smoking will lead to worsening emphysema, shortness of breath and increases the risk for cancer.       Medication List         Acidophilus Caps capsule  Take 2 capsules by mouth daily.     albuterol 90 MCG/ACT inhaler  Commonly known as:  PROVENTIL,VENTOLIN  Inhale 2 puffs into the lungs every 6 (six) hours as needed. For shortness of breath     ALPRAZolam 1 MG tablet  Commonly known as:  XANAX  Take 1-2 mg by mouth 3 (three) times daily. Patient takes 1 tablet twice a day and 2 tablets at bedtime     amphetamine-dextroamphetamine 15 MG 24 hr capsule  Commonly known as:  ADDERALL XR  Take 15 mg by mouth 2 (two) times daily. As needed     cyclobenzaprine 5 MG tablet  Commonly known as:  FLEXERIL  Take 5 mg by mouth every 8 (eight) hours as needed for muscle spasms.     DEXILANT 60 MG capsule  Generic drug:  dexlansoprazole  Take 60 mg by mouth every morning.     dicyclomine 10 MG capsule  Commonly known as:  BENTYL  Take 10 mg by mouth 3 (three) times daily.     doxycycline  100 MG tablet  Commonly known as:  VIBRA-TABS  Take 1 tablet (100 mg total) by mouth every 12 (twelve) hours.     DULoxetine 60 MG capsule  Commonly known as:  CYMBALTA  Take 120 mg by mouth every morning.     famotidine 20 MG tablet  Commonly known as:  PEPCID  Take 20 mg by mouth 3 (three) times daily.     gabapentin 300 MG capsule  Commonly known as:  NEURONTIN  Take 300-900 mg by mouth 2 (two) times  daily. 1 capsule in the morning and 3 capsules at bedtime     HYDROcodone-acetaminophen 10-325 MG per tablet  Commonly known as:  NORCO  Take 1 tablet by mouth 4 (four) times daily.     lidocaine 5 %  Commonly known as:  LIDODERM  Place 1 patch onto the skin daily as needed. Uses for pain in back. Not used regularly.     montelukast 10 MG tablet  Commonly known as:  SINGULAIR  Take 10 mg by mouth daily.     multivitamin with minerals Tabs tablet  Take 1 tablet by mouth daily.     nicotine 21 mg/24hr patch  Commonly known as:  NICODERM CQ - dosed in mg/24 hours  Place 1 patch onto the skin daily.     predniSONE 10 MG tablet  Commonly known as:  DELTASONE  9/4-9/6: Take 40 mg daily by mouth. 9/7-9/9: Take 20 mg by mouth daily. 9/10-9/12: Take 10 mg by mouth daily then stop.     tiotropium 18 MCG inhalation capsule  Commonly known as:  SPIRIVA  Place 1 capsule (18 mcg total) into inhaler and inhale daily.     topiramate 25 MG tablet  Commonly known as:  TOPAMAX  Take 25 mg by mouth 3 (three) times daily.     traMADol 50 MG tablet  Commonly known as:  ULTRAM  Take 50 mg by mouth every 8 (eight) hours as needed. For pain     zolpidem 12.5 MG CR tablet  Commonly known as:  AMBIEN CR  Take 12.5 mg by mouth at bedtime as needed. For sleep       Allergies  Allergen Reactions  . Penicillins Anaphylaxis    REACTION: anaphylaxis  . Sulfa Antibiotics Itching  . Aspirin Other (See Comments)    Burning of stomach. REACTION: GI upset  . Ibuprofen Nausea Only    Upset stomach due to acid reflux  . Lactaid [Lactase] Diarrhea and Other (See Comments)    Stomach cramping  . Morphine And Related Itching    The results of significant diagnostics from this hospitalization (including imaging, microbiology, ancillary and laboratory) are listed below for reference.    Significant Diagnostic Studies: Dg Chest 2 View  06/08/2013   *RADIOLOGY REPORT*  Clinical Data: Shortness of breath  and cough  CHEST - 2 VIEW  Comparison: Acute abdominal series 04/11/2013  Findings: Marked emphysematous changes noted.  Lungs are hyperinflated with attenuation of lung markings in the upper lung fields.  No airspace disease or pleural effusion.  Stable scarring at the lateral left lung base.  Negative for airspace disease, pneumothorax, or pleural effusion.  Postsurgical changes of the lower cervical spine.  No acute osseous abnormality identified.  IMPRESSION: Advanced emphysema.  No acute findings identified.   Original Report Authenticated By: Britta Mccreedy, M.D.    Microbiology: Recent Results (from the past 240 hour(s))  MRSA PCR SCREENING     Status: None  Collection Time    06/09/13  9:08 AM      Result Value Range Status   MRSA by PCR NEGATIVE  NEGATIVE Final   Comment:            The GeneXpert MRSA Assay (FDA     approved for NASAL specimens     only), is one component of a     comprehensive MRSA colonization     surveillance program. It is not     intended to diagnose MRSA     infection nor to guide or     monitor treatment for     MRSA infections.     Labs: Basic Metabolic Panel:  Recent Labs Lab 06/08/13 2223 06/09/13 0557  NA 142 139  K 2.9* 4.0  CL 108 107  CO2 24 20  GLUCOSE 142* 184*  BUN 14 10  CREATININE 0.59 0.52  CALCIUM 9.1 9.3   Liver Function Tests:  Recent Labs Lab 06/09/13 0557  AST 38*  ALT 28  ALKPHOS 75  BILITOT 0.2*  PROT 6.7  ALBUMIN 3.6   CBC:  Recent Labs Lab 06/08/13 2223 06/09/13 0557  WBC 9.9 7.8  NEUTROABS 7.7  --   HGB 11.0* 11.2*  HCT 34.1* 34.9*  MCV 94.2 94.1  PLT 249 279   Cardiac Enzymes:  Recent Labs Lab 06/09/13 0148 06/09/13 0745 06/09/13 1328  TROPONINI <0.30 <0.30 <0.30    Principal Problem:   Acute bronchitis Active Problems:   GERD   NECK PAIN, CHRONIC   Hypokalemia   Muscle spasms of neck   Time coordinating discharge: 25 minutes  Signed:  Brendia Sacks, MD [facility] 06/10/2013, 1:39 PM

## 2013-06-10 NOTE — Progress Notes (Signed)
[facility] PROGRESS NOTE  [name]:[id number] DOB: [birth year] DOA: 06/08/2013 PCP: Elby Showers, MD  Assessment/Plan: 1. Acute bronchitis, asthma/COPD exacerbation: Appears much better by chart review. Good air movement. Plan discharge home on oral steroids, antibiotics. Add Spiriva. Continue albuterol. She has some pleuritic pain which persists but no chest pain. Serial troponin negative. 2. Hypokalemia: Resolved. 3. Mild elevation of AST: Significance unclear. Followup as an outpatient. 4. History of peptic ulcer disease, hiatal hernia 5. History of hiatal hernia 6. Ongoing cigarette smoker: She is committed to stopping smoking.   Wean oxygen  Home today as outlined above  Pending studies:   None  Code Status: full  DVT prophylaxis: Lovenox Family Communication: None present Disposition Plan: Home  Brendia Sacks, MD  [facility]  Pager [phone] If 7PM-7AM, please contact night-coverage at [email or web address], password [facility] 06/10/2013, 12:53 PM  LOS: 2 days   Summary: This 52-year-old woman with history of asthma presented emergency department with worsening shortness of breath. She was admitted for acute bronchitis, asthma exacerbation.  Consultants:  None  Procedures:  None  Antibiotics:  Levaquin 9/2 >> 9/6  HPI/Subjective: Overall doing okay. Still has productive cough. Breathing okay. Very anxious. She is committed to stopping smoking.  Objective: Filed Vitals:   06/09/13 2211 06/10/13 0401 06/10/13 0432 06/10/13 0656  BP: 112/59 102/67 104/72   Pulse: 94 101 97   Temp: 97.4 F (36.3 C)  97.7 F (36.5 C)   TempSrc: Oral Oral Oral   Resp: 20 18 18    Height:      Weight:      SpO2: 98%  99% 95%    Intake/Output Summary (Last 24 hours) at 06/10/13 1253 Last data filed at 06/09/13 1830  Gross per 24 hour  Intake    360 ml  Output      0 ml  Net    360 ml     Filed Weights   06/08/13 2005 06/09/13 0223  Weight: 60.782  kg (134 lb) 62.8 kg (138 lb 7.2 oz)    Exam:   Afebrile, vital signs stable.  General: Appears calm and comfortable. Speech fluent and clear. She is able to speak in full senses without difficulty.  Cardiovascular: Regular rate and rhythm. No murmur, rub, gallop.  Respiratory: Fair air movement, no frank wheezes, rales or rhonchi. Normal respiratory effort.  Psychiatric: Nervous affect but makes good eye contact.  Data Reviewed:  No labs today.  Chest x-ray on admission demonstrated advanced emphysema  Scheduled Meds: . albuterol  2.5 mg Nebulization TID  . ALPRAZolam  1 mg Oral BID  . ALPRAZolam  2 mg Oral QHS  . amphetamine-dextroamphetamine  15 mg Oral BID  . baclofen  5 mg Oral TID  . dicyclomine  10 mg Oral TID  . DULoxetine  120 mg Oral QAC breakfast  . enoxaparin (LOVENOX) injection  40 mg Subcutaneous Q24H  . famotidine  20 mg Oral BID  . gabapentin  300 mg Oral Daily  . gabapentin  900 mg Oral QHS  . HYDROcodone-acetaminophen  1 tablet Oral QID  . ipratropium  0.5 mg Nebulization TID  . levofloxacin (LEVAQUIN) IV  750 mg Intravenous Q24H  . multivitamin with minerals  1 tablet Oral Daily  . pantoprazole  40 mg Oral Daily  . predniSONE  50 mg Oral Q breakfast  . sodium chloride  3 mL Intravenous Q12H  . topiramate  25 mg Oral TID   Continuous Infusions:   Active Problems:  GERD   NECK PAIN, CHRONIC   Acute bronchitis   Hypokalemia   Muscle spasms of neck

## 2013-06-10 NOTE — Progress Notes (Signed)
Pt verbalizes understanding of d/c instructions. Pt was concerned about taking levaquin at home d/t hx of c-diff after taking it in the past. Dr. Irene Limbo was notified of this, and pts antibiotic was changed to doxycycline. Pt also requested something for nausea. Dr. Irene Limbo was made aware of this, no new orders received, as pt has not taken anything for nausea since admission. Pt verbalizes understanding of d/c instructions, medications, and necessary follow up appts with her PCP in 1 week. Pt has no further questions at this time. IV was d/c without complications. Pt d/c via wheelchair, accompanied by NT and her friends which will be taking her home. Sheryn Bison

## 2013-06-25 ENCOUNTER — Institutional Professional Consult (permissible substitution): Payer: PRIVATE HEALTH INSURANCE | Admitting: Internal Medicine

## 2013-07-06 ENCOUNTER — Institutional Professional Consult (permissible substitution): Payer: PRIVATE HEALTH INSURANCE | Admitting: Internal Medicine

## 2013-07-09 ENCOUNTER — Encounter: Payer: Self-pay | Admitting: Internal Medicine

## 2013-07-30 ENCOUNTER — Ambulatory Visit (HOSPITAL_COMMUNITY)
Admission: RE | Admit: 2013-07-30 | Discharge: 2013-07-30 | Disposition: A | Discharge status: Home / Self Care | Payer: PRIVATE HEALTH INSURANCE | Source: Ambulatory Visit | Attending: Physician Assistant | Admitting: Physician Assistant

## 2013-07-30 ENCOUNTER — Other Ambulatory Visit (HOSPITAL_COMMUNITY): Payer: Self-pay | Admitting: Physician Assistant

## 2013-07-30 DIAGNOSIS — M25559 Pain in unspecified hip: Secondary | ICD-10-CM | POA: Insufficient documentation

## 2013-07-30 DIAGNOSIS — G8929 Other chronic pain: Secondary | ICD-10-CM

## 2013-08-13 ENCOUNTER — Emergency Department (HOSPITAL_COMMUNITY)
Admission: EM | Admit: 2013-08-13 | Discharge: 2013-08-13 | Disposition: A | Discharge status: Home / Self Care | Payer: PRIVATE HEALTH INSURANCE | Attending: Emergency Medicine | Admitting: Emergency Medicine

## 2013-08-13 ENCOUNTER — Encounter (HOSPITAL_COMMUNITY): Payer: Self-pay | Admitting: Emergency Medicine

## 2013-08-13 ENCOUNTER — Emergency Department (HOSPITAL_COMMUNITY): Payer: PRIVATE HEALTH INSURANCE

## 2013-08-13 ENCOUNTER — Telehealth: Payer: Self-pay

## 2013-08-13 DIAGNOSIS — Z87442 Personal history of urinary calculi: Secondary | ICD-10-CM | POA: Insufficient documentation

## 2013-08-13 DIAGNOSIS — K5289 Other specified noninfective gastroenteritis and colitis: Secondary | ICD-10-CM | POA: Insufficient documentation

## 2013-08-13 DIAGNOSIS — I252 Old myocardial infarction: Secondary | ICD-10-CM | POA: Insufficient documentation

## 2013-08-13 DIAGNOSIS — J441 Chronic obstructive pulmonary disease with (acute) exacerbation: Secondary | ICD-10-CM | POA: Insufficient documentation

## 2013-08-13 DIAGNOSIS — Z792 Long term (current) use of antibiotics: Secondary | ICD-10-CM | POA: Insufficient documentation

## 2013-08-13 DIAGNOSIS — F17200 Nicotine dependence, unspecified, uncomplicated: Secondary | ICD-10-CM | POA: Insufficient documentation

## 2013-08-13 DIAGNOSIS — F909 Attention-deficit hyperactivity disorder, unspecified type: Secondary | ICD-10-CM | POA: Insufficient documentation

## 2013-08-13 DIAGNOSIS — I2510 Atherosclerotic heart disease of native coronary artery without angina pectoris: Secondary | ICD-10-CM | POA: Insufficient documentation

## 2013-08-13 DIAGNOSIS — Z79899 Other long term (current) drug therapy: Secondary | ICD-10-CM | POA: Insufficient documentation

## 2013-08-13 DIAGNOSIS — G43909 Migraine, unspecified, not intractable, without status migrainosus: Secondary | ICD-10-CM | POA: Insufficient documentation

## 2013-08-13 DIAGNOSIS — Z9889 Other specified postprocedural states: Secondary | ICD-10-CM | POA: Insufficient documentation

## 2013-08-13 DIAGNOSIS — R42 Dizziness and giddiness: Secondary | ICD-10-CM | POA: Insufficient documentation

## 2013-08-13 DIAGNOSIS — Z8619 Personal history of other infectious and parasitic diseases: Secondary | ICD-10-CM | POA: Insufficient documentation

## 2013-08-13 DIAGNOSIS — Z880 Allergy status to penicillin: Secondary | ICD-10-CM | POA: Insufficient documentation

## 2013-08-13 DIAGNOSIS — Z8739 Personal history of other diseases of the musculoskeletal system and connective tissue: Secondary | ICD-10-CM | POA: Insufficient documentation

## 2013-08-13 DIAGNOSIS — Z8614 Personal history of Methicillin resistant Staphylococcus aureus infection: Secondary | ICD-10-CM | POA: Insufficient documentation

## 2013-08-13 DIAGNOSIS — K589 Irritable bowel syndrome without diarrhea: Secondary | ICD-10-CM | POA: Insufficient documentation

## 2013-08-13 DIAGNOSIS — N390 Urinary tract infection, site not specified: Secondary | ICD-10-CM

## 2013-08-13 DIAGNOSIS — K529 Noninfective gastroenteritis and colitis, unspecified: Secondary | ICD-10-CM

## 2013-08-13 DIAGNOSIS — R079 Chest pain, unspecified: Secondary | ICD-10-CM | POA: Insufficient documentation

## 2013-08-13 LAB — CBC WITH DIFFERENTIAL/PLATELET
Basophils Absolute: 0.1 10*3/uL (ref 0.0–0.1)
Basophils Relative: 0 % (ref 0–1)
Eosinophils Absolute: 0.1 10*3/uL (ref 0.0–0.7)
HCT: 43.5 % (ref 36.0–46.0)
Lymphs Abs: 1 10*3/uL (ref 0.7–4.0)
MCH: 30.2 pg (ref 26.0–34.0)
MCHC: 32.2 g/dL (ref 30.0–36.0)
Neutrophils Relative %: 89 % — ABNORMAL HIGH (ref 43–77)
Platelets: 322 10*3/uL (ref 150–400)
RBC: 4.63 MIL/uL (ref 3.87–5.11)
RDW: 13.2 % (ref 11.5–15.5)

## 2013-08-13 LAB — URINE MICROSCOPIC-ADD ON

## 2013-08-13 LAB — COMPREHENSIVE METABOLIC PANEL
ALT: 20 U/L (ref 0–35)
Albumin: 4.4 g/dL (ref 3.5–5.2)
Alkaline Phosphatase: 74 U/L (ref 39–117)
CO2: 30 mEq/L (ref 19–32)
Calcium: 10.7 mg/dL — ABNORMAL HIGH (ref 8.4–10.5)
Chloride: 104 mEq/L (ref 96–112)
Creatinine, Ser: 0.71 mg/dL (ref 0.50–1.10)
GFR calc Af Amer: >90 mL/min (ref >90)
GFR calc non Af Amer: >90 mL/min (ref >90)
Glucose, Bld: 182 mg/dL — ABNORMAL HIGH (ref 70–99)
Potassium: 3.5 mEq/L (ref 3.5–5.1)
Sodium: 144 mEq/L (ref 135–145)
Total Bilirubin: 0.3 mg/dL (ref 0.3–1.2)

## 2013-08-13 LAB — URINALYSIS, ROUTINE W REFLEX MICROSCOPIC
Glucose, UA: NEGATIVE mg/dL
Nitrite: NEGATIVE
Protein, ur: NEGATIVE mg/dL
Urobilinogen, UA: 0.2 mg/dL (ref 0.0–1.0)

## 2013-08-13 MED ORDER — FAMOTIDINE 20 MG PO TABS
20.0000 mg | ORAL_TABLET | Freq: Once | ORAL | Status: AC
  Administered 2013-08-13: 20 mg via ORAL
  Filled 2013-08-13: qty 1

## 2013-08-13 MED ORDER — ONDANSETRON HCL 4 MG/2ML IJ SOLN
4.0000 mg | Freq: Once | INTRAMUSCULAR | Status: AC
  Administered 2013-08-13: 4 mg via INTRAVENOUS
  Filled 2013-08-13: qty 2

## 2013-08-13 MED ORDER — SODIUM CHLORIDE 0.9 % IV SOLN: INTRAVENOUS | Status: DC

## 2013-08-13 MED ORDER — CIPROFLOXACIN IN D5W 400 MG/200ML IV SOLN
400.0000 mg | Freq: Once | INTRAVENOUS | Status: AC
  Administered 2013-08-13: 400 mg via INTRAVENOUS
  Filled 2013-08-13: qty 200

## 2013-08-13 MED ORDER — IOHEXOL 300 MG/ML  SOLN
100.0000 mL | Freq: Once | INTRAMUSCULAR | Status: AC | PRN
  Administered 2013-08-13: 100 mL via INTRAVENOUS

## 2013-08-13 MED ORDER — HYDROMORPHONE HCL PF 1 MG/ML IJ SOLN
1.0000 mg | Freq: Once | INTRAMUSCULAR | Status: AC
  Administered 2013-08-13: 1 mg via INTRAVENOUS
  Filled 2013-08-13: qty 1

## 2013-08-13 MED ORDER — SODIUM CHLORIDE 0.9 % IV BOLUS (SEPSIS)
250.0000 mL | Freq: Once | INTRAVENOUS | Status: AC
  Administered 2013-08-13: 250 mL via INTRAVENOUS

## 2013-08-13 MED ORDER — IOHEXOL 300 MG/ML  SOLN
50.0000 mL | Freq: Once | INTRAMUSCULAR | Status: AC | PRN
  Administered 2013-08-13: 50 mL via ORAL

## 2013-08-13 MED ORDER — CIPROFLOXACIN HCL 500 MG PO TABS: 500.0000 mg | ORAL_TABLET | Freq: Two times a day (BID) | ORAL | Status: DC

## 2013-08-13 MED ORDER — PROMETHAZINE HCL 25 MG PO TABS: 25.0000 mg | ORAL_TABLET | Freq: Four times a day (QID) | ORAL | Status: DC | PRN

## 2013-08-13 MED ORDER — HYDROCODONE-ACETAMINOPHEN 5-325 MG PO TABS: 1.0000 | ORAL_TABLET | Freq: Four times a day (QID) | ORAL | Status: DC | PRN

## 2013-08-13 MED ORDER — LOPERAMIDE HCL 2 MG PO TABS: 2.0000 mg | ORAL_TABLET | Freq: Four times a day (QID) | ORAL | Status: DC | PRN

## 2013-08-13 MED ORDER — ONDANSETRON HCL 4 MG/2ML IJ SOLN
4.0000 mg | Freq: Once | INTRAMUSCULAR | Status: DC
  Filled 2013-08-13: qty 2

## 2013-08-13 MED ORDER — SODIUM CHLORIDE 0.9 % IV BOLUS (SEPSIS)
500.0000 mL | Freq: Once | INTRAVENOUS | Status: AC
  Administered 2013-08-13: 500 mL via INTRAVENOUS

## 2013-08-13 MED ORDER — METRONIDAZOLE 500 MG PO TABS: ORAL_TABLET | ORAL | Status: DC

## 2013-08-13 MED ORDER — ONDANSETRON HCL 4 MG/2ML IJ SOLN
4.0000 mg | Freq: Once | INTRAMUSCULAR | Status: AC
  Administered 2013-08-13: 4 mg via INTRAVENOUS

## 2013-08-13 MED ORDER — ONDANSETRON 4 MG PO TBDP: 4.0000 mg | ORAL_TABLET | Freq: Three times a day (TID) | ORAL | Status: DC | PRN

## 2013-08-13 NOTE — ED Notes (Signed)
Pt comes from home via EMS with c/o central abdominal pain x3 days and vomiting that began this morning. Pt denies chest pain, denies blood in vomit.

## 2013-08-13 NOTE — Telephone Encounter (Signed)
PLEASE CALL PT. Rx for Flagyl for 21 days TO PREVENT C DIFF.[name]

## 2013-08-13 NOTE — Telephone Encounter (Signed)
Pt called back and said she has a UTI and was given antibiotics and she wants to make Dr. Darrick Penna aware.

## 2013-08-13 NOTE — ED Provider Notes (Signed)
ACT in CSN: [id number]     Arrival date & time 08/13/13  0911 History  This chart was scribed for Shelda Jakes, MD,  by Ashley Jacobs, ED Scribe. The patient was seen in room APA03/APA03 and the patient's care was started at 9:54 AM   First MD Initiated Contact with Patient 08/13/13 0945     Chief Complaint  Patient presents with  . Abdominal Pain  . Emesis   (Consider location/radiation/quality/duration/timing/severity/associated sxs/prior Treatment) Patient is a 52 y.o. female presenting with abdominal pain and vomiting. The history is provided by the patient and medical records. No language interpreter was used.  Abdominal Pain Onset quality:  Sudden Timing:  Constant Chronicity:  New Context: not alcohol use   Relieved by:  Nothing Worsened by:  Nothing tried Ineffective treatments:  None tried Associated symptoms: chest pain, chills, diarrhea, fever, nausea, shortness of breath (baseline) and vomiting   Associated symptoms: no cough, no dysuria and no sore throat   Risk factors: no alcohol abuse   Emesis Associated symptoms: abdominal pain, chills and diarrhea   Associated symptoms: no headaches and no sore throat    HPI Comments: [name] is a 52 y.o. female who presents to the Emergency Department via EMS complaining of severe abdominal pain and emesis. She is experiencing abdominal pain that is constant, and waxing waning in severity. It is described as "hernia" pain that radiates from her periumbilical region to her LLQ for the past three days. Pt experienced associated symptoms of nausea and vomiting this morning. She experienced mild diarrhea which was treated with Pepto-Bismol. Pt has the associated symptoms of light headedness, mild fever, SOB and chest pain.  She denies chest pain, hematemesis and dysuria .Pt has a past medical hx of COPD, chronic nausea, CAD,bowel obstruction, several abdominal surgeries, MI and anginal pain. She currently smokes 1 pack of  cigarettes a day and does not drink alcohol.  She is a pt at [employer]; her PCP is Dr. Elby Showers. Past Medical History  Diagnosis Date  . S/P colonoscopy 06/02/09    normal (Dr. Linna Darner)  . PUD (peptic ulcer disease) 01/2007    EGD Dr Jena Gauss, 2 antral ulcers, negative h pylori  . MRSA infection     Dr. Lenice Pressman, currently under treatment  . Hiatal hernia 2008    on EGD above  . Asthma   . PTSD (post-traumatic stress disorder)   . Migraines   . Degenerative disc disease   . Rectal prolapse   . SBO (small bowel obstruction)   . ADHD (attention deficit hyperactivity disorder)   . S/P endoscopy 03/26/11    gastritis-Dr Darrick Penna  . Clostridium difficile colitis 04/10/11 &11/2011    tx w/ flagyl  . Kidney stones   . Chronic abdominal pain 2003    EX LAP APR 2004 RUPTURE L OV CYST, JUL 2004 ADHESIONS  . Irritable bowel syndrome 2004 CONSTIPATION  . BMI (body mass index) 20.0-29.9 2009 127 LBS  . COPD (chronic obstructive pulmonary disease)   . Chronic nausea   . Medical history non-contributory   . Coronary artery disease   . Myocardial infarction   . Anginal pain    Past Surgical History  Procedure Laterality Date  . Bowel resection      x2, secondary to adhesions  . Appendectomy    . Laparoscopic lysis intestinal adhesions    . Back surgery    . Abdominal hysterectomy    . Tubal ligation    .  Umbilical hernia repair  2008    x5  . Neck surgery  2009  . Partial hysterectomy    . Upper gastrointestinal endoscopy  APR 2008 [facility] BLEEDING/PAIN    PUD  . Upper gastrointestinal endoscopy  JUL 2012 [facility] PAIN    MILD GASTRITIS  . Esophagogastroduodenoscopy  03/23/2011     Normal esophagus without evidence of Barrett's, mass, erosions, or ulcerations./ Patchy erythema with occasional erosion in the antrum.  Biopsies  obtained via cold forceps to evaluate for H. pylori gastritis/ Small hiatal hernia./Normal duodenal bulb and second portion of the duodenum.  . Cervical  spine surgery    . Flexible sigmoidoscopy  07/14/2002    Normal limited flexible sigmoidoscopy with stool in the rectum and rectosigmoid precluding a full colonoscopy   Family History  Problem Relation Age of Onset  . Adopted: Yes   History  Substance Use Topics  . Smoking status: Current Every Day Smoker -- 1.00 packs/day for 1 years    Types: Cigarettes  . Smokeless tobacco: Former Neurosurgeon  . Alcohol Use: No   OB History   Grav Para Term Preterm Abortions TAB SAB Ect Mult Living   1 1 1       1      Review of Systems  Constitutional: Positive for fever and chills.  HENT: Negative for congestion, rhinorrhea and sore throat.   Eyes: Negative for visual disturbance.  Respiratory: Positive for shortness of breath (baseline). Negative for cough.   Cardiovascular: Positive for chest pain. Negative for leg swelling.  Gastrointestinal: Positive for nausea, vomiting, abdominal pain and diarrhea.  Genitourinary: Negative for dysuria.  Musculoskeletal: Negative for joint swelling.  Skin: Negative for rash.  Neurological: Positive for light-headedness. Negative for headaches.  Hematological: Does not bruise/bleed easily.  Psychiatric/Behavioral: Negative for confusion.  All other systems reviewed and are negative.    Allergies  Penicillins; Sulfa antibiotics; Aspirin; Ibuprofen; Lactaid; and Morphine and related  Home Medications   Current Outpatient Rx  Name  Route  Sig  Dispense  Refill  . albuterol (PROVENTIL,VENTOLIN) 90 MCG/ACT inhaler   Inhalation   Inhale 2 puffs into the lungs every 6 (six) hours as needed. For shortness of breath         . ALPRAZolam (XANAX) 1 MG tablet   Oral   Take 1-2 mg by mouth 3 (three) times daily. Patient takes 1 tablet twice a day and 2 tablets at bedtime         . amphetamine-dextroamphetamine (ADDERALL XR) 15 MG 24 hr capsule   Oral   Take 15 mg by mouth daily.          . cyclobenzaprine (FLEXERIL) 5 MG tablet   Oral   Take 5 mg  by mouth every 8 (eight) hours as needed for muscle spasms.         Marland Kitchen dexlansoprazole (DEXILANT) 60 MG capsule   Oral   Take 60 mg by mouth every morning.          . dicyclomine (BENTYL) 10 MG capsule   Oral   Take 10 mg by mouth 3 (three) times daily.         . DULoxetine (CYMBALTA) 60 MG capsule   Oral   Take 120 mg by mouth every morning.          . gabapentin (NEURONTIN) 300 MG capsule   Oral   Take 300-900 mg by mouth 2 (two) times daily. 1 capsule in the morning and 3 capsules at  bedtime         . HYDROcodone-acetaminophen (NORCO) 10-325 MG per tablet   Oral   Take 1 tablet by mouth 4 (four) times daily.         . Lactobacillus (ACIDOPHILUS) CAPS   Oral   Take 2 capsules by mouth daily.          Marland Kitchen lidocaine (LIDODERM) 5 %   Transdermal   Place 1 patch onto the skin daily as needed. Uses for pain in back. Not used regularly.         . montelukast (SINGULAIR) 10 MG tablet   Oral   Take 10 mg by mouth daily.          . Multiple Vitamin (MULTIVITAMIN WITH MINERALS) TABS   Oral   Take 1 tablet by mouth daily.         . nicotine (NICODERM CQ - DOSED IN MG/24 HOURS) 21 mg/24hr patch   Transdermal   Place 1 patch onto the skin daily.   28 patch   0   . topiramate (TOPAMAX) 25 MG tablet   Oral   Take 25 mg by mouth 3 (three) times daily.         . traMADol (ULTRAM) 50 MG tablet   Oral   Take 50 mg by mouth every 8 (eight) hours as needed. For pain         . zolpidem (AMBIEN CR) 12.5 MG CR tablet   Oral   Take 12.5 mg by mouth at bedtime as needed. For sleep         . ciprofloxacin (CIPRO) 500 MG tablet   Oral   Take 1 tablet (500 mg total) by mouth 2 (two) times daily.   14 tablet   0   . HYDROcodone-acetaminophen (NORCO/VICODIN) 5-325 MG per tablet   Oral   Take 1-2 tablets by mouth every 6 (six) hours as needed for moderate pain.   20 tablet   0   . loperamide (IMODIUM A-D) 2 MG tablet   Oral   Take 1 tablet (2 mg total)  by mouth 4 (four) times daily as needed for diarrhea or loose stools.   30 tablet   0   . ondansetron (ZOFRAN ODT) 4 MG disintegrating tablet   Oral   Take 1 tablet (4 mg total) by mouth every 8 (eight) hours as needed.   12 tablet   1   . promethazine (PHENERGAN) 25 MG tablet   Oral   Take 1 tablet (25 mg total) by mouth every 6 (six) hours as needed for nausea or vomiting.   12 tablet   0    BP 108/52  Pulse 52  Temp(Src) 97.4 F (36.3 C) (Oral)  Resp 20  SpO2 100% Physical Exam  Nursing note and vitals reviewed. Constitutional: She is oriented to person, place, and time. She appears well-developed and well-nourished.  HENT:  Head: Normocephalic and atraumatic.  Eyes: EOM are normal.  Neck: Neck supple.  Cardiovascular: Normal rate, regular rhythm and normal heart sounds.  Exam reveals no gallop and no friction rub.   No murmur heard. Pulmonary/Chest: Effort normal and breath sounds normal. No respiratory distress. She has no wheezes. She has no rales.  Abdominal: Bowel sounds are normal. She exhibits no distension. There is tenderness. There is no rebound and no guarding.  Well healed midline surgical scar Tender in LLQ and periumbilical region.  No obvious hernia present  Lymphadenopathy:    She has no  cervical adenopathy.  Neurological: She is alert and oriented to person, place, and time. No cranial nerve deficit. Coordination normal.  Skin: Skin is warm. No rash noted.  Psychiatric: She has a normal mood and affect. Her behavior is normal.    ED Course  Procedures (including critical care time) DIAGNOSTIC STUDIES: Oxygen Saturation is 100% on room air normal by my interpretation.    COORDINATION OF CARE: 9:59 AM Discussed course of care with pt which includes Zofran, sodium chloride, Dilaudid, Omnipaque, pelvis CT and laboratory tests. Pt understands and agrees.  Labs Review Labs Reviewed  COMPREHENSIVE METABOLIC PANEL - Abnormal; Notable for the  following:    Glucose, Bld 182 (*)    Calcium 10.7 (*)    All other components within normal limits  CBC WITH DIFFERENTIAL - Abnormal; Notable for the following:    WBC 14.5 (*)    Neutrophils Relative % 89 (*)    Neutro Abs 12.9 (*)    Lymphocytes Relative 7 (*)    All other components within normal limits  URINALYSIS, ROUTINE W REFLEX MICROSCOPIC - Abnormal; Notable for the following:    pH 8.5 (*)    Leukocytes, UA SMALL (*)    All other components within normal limits  URINE MICROSCOPIC-ADD ON - Abnormal; Notable for the following:    Bacteria, UA FEW (*)    All other components within normal limits  URINE CULTURE  LIPASE, BLOOD   Results for orders placed during the hospital encounter of 08/13/13  COMPREHENSIVE METABOLIC PANEL      Result Value Range   Sodium 144  135 - 145 mEq/L   Potassium 3.5  3.5 - 5.1 mEq/L   Chloride 104  96 - 112 mEq/L   CO2 30  19 - 32 mEq/L   Glucose, Bld 182 (*) 70 - 99 mg/dL   BUN 13  6 - 23 mg/dL   Creatinine, Ser 1.61  0.50 - 1.10 mg/dL   Calcium 09.6 (*) 8.4 - 10.5 mg/dL   Total Protein 7.4  6.0 - 8.3 g/dL   Albumin 4.4  3.5 - 5.2 g/dL   AST 29  0 - 37 U/L   ALT 20  0 - 35 U/L   Alkaline Phosphatase 74  39 - 117 U/L   Total Bilirubin 0.3  0.3 - 1.2 mg/dL   GFR calc non Af Amer >90  >90 mL/min   GFR calc Af Amer >90  >90 mL/min  LIPASE, BLOOD      Result Value Range   Lipase 19  11 - 59 U/L  CBC WITH DIFFERENTIAL      Result Value Range   WBC 14.5 (*) 4.0 - 10.5 K/uL   RBC 4.63  3.87 - 5.11 MIL/uL   Hemoglobin 14.0  12.0 - 15.0 g/dL   HCT 04.5  40.9 - 81.1 %   MCV 94.0  78.0 - 100.0 fL   MCH 30.2  26.0 - 34.0 pg   MCHC 32.2  30.0 - 36.0 g/dL   RDW 91.4  78.2 - 95.6 %   Platelets 322  150 - 400 K/uL   Neutrophils Relative % 89 (*) 43 - 77 %   Neutro Abs 12.9 (*) 1.7 - 7.7 K/uL   Lymphocytes Relative 7 (*) 12 - 46 %   Lymphs Abs 1.0  0.7 - 4.0 K/uL   Monocytes Relative 3  3 - 12 %   Monocytes Absolute 0.5  0.1 - 1.0 K/uL    Eosinophils  Relative 1  0 - 5 %   Eosinophils Absolute 0.1  0.0 - 0.7 K/uL   Basophils Relative 0  0 - 1 %   Basophils Absolute 0.1  0.0 - 0.1 K/uL  URINALYSIS, ROUTINE W REFLEX MICROSCOPIC      Result Value Range   Color, Urine YELLOW  YELLOW   APPearance CLEAR  CLEAR   Specific Gravity, Urine 1.020  1.005 - 1.030   pH 8.5 (*) 5.0 - 8.0   Glucose, UA NEGATIVE  NEGATIVE mg/dL   Hgb urine dipstick NEGATIVE  NEGATIVE   Bilirubin Urine NEGATIVE  NEGATIVE   Ketones, ur NEGATIVE  NEGATIVE mg/dL   Protein, ur NEGATIVE  NEGATIVE mg/dL   Urobilinogen, UA 0.2  0.0 - 1.0 mg/dL   Nitrite NEGATIVE  NEGATIVE   Leukocytes, UA SMALL (*) NEGATIVE  URINE MICROSCOPIC-ADD ON      Result Value Range   WBC, UA TOO NUMEROUS TO COUNT  <3 WBC/hpf   Bacteria, UA FEW (*) RARE    Imaging Review Ct Abdomen Pelvis W Contrast  08/13/2013   CLINICAL DATA:  Severe abdominal pain and emesis, periumbilical pain radiating to left lower quadrant for 3 days. History of bowel obstruction and abdominal surgery.  EXAM: CT ABDOMEN AND PELVIS WITH CONTRAST  TECHNIQUE: Multidetector CT imaging of the abdomen and pelvis was performed using the standard protocol following bolus administration of intravenous contrast.  CONTRAST:  50mL OMNIPAQUE IOHEXOL 300 MG/ML SOLN, OMNIPAQUE IOHEXOL 300 MG/ML SOLN  COMPARISON:  Abdominal radiograph April 11, 2013. CT of the abdomen and pelvis Feb 05, 2013  FINDINGS: Limited view of the lung bases demonstrate minimal dependent atelectasis and are otherwise unremarkable. Included heart and pericardium are unremarkable.  Small hiatal hernia. The stomach, small and large demonstrate mild fluid distension, with air-fluid levels and multiple transition points. No pneumatosis. Sigmoid anastomosis again noted, contrast has yet to reach the distal small bowel. Fecalization of small bowel contents, with a solitary loop of distended small bowel in the pelvis. Air and stool seen in the large bowel. Trace  ascites without drainable fluid collection.  The liver, spleen, pancreas, gallbladder and adrenal gland are unremarkable.  Kidneys are well located, normal in appearance. Great vessels are normal course and caliber with mild intimal thickening. Internal reproductive organs appear surgically absent. Urinary bladder is deep is unremarkable. Phleboliths in the pelvis.  Coil material along the anterior abdominal wall consistent with prior herniorrhaphy. Small right fat containing inguinal hernia. L4-5 and L5-S1 PLIF.  IMPRESSION: Fluid filled mildly distended small and large bowel, suggesting enteritis and mild ileus, this is unlikely to reflect a small bowel obstruction. No bowel perforation. Trace ascites.   Electronically Signed   By: Awilda Metro   On: 08/13/2013 13:23    EKG Interpretation   None       MDM   1. UTI (lower urinary tract infection)   2. Gastroenteritis     Patient's abdomen without any acute abdominal process. Urinalysis consistent with a urinary tract infection believes she is probably urinary tract infection and a gastro-enteritis. We'll treat with Cipro for the urinary tract infection. CT of the abdomen showed no evidence of bowel obstruction. Will also treat with anti-nausea medicine and antidiarrhea medicine. Patient improved here in the emergency department. Labs without any significant abnormalities. Mild leukocytosis noted. Patient will continue Cipro at home will return if not improved in a couple days.   I personally performed the services described in this documentation, which  was scribed in my presence. The recorded information has been reviewed and is accurate.     Shelda Jakes, MD 08/13/13 1500

## 2013-08-13 NOTE — Telephone Encounter (Signed)
Pt left VM that she was having some bad abdominal pain and went to the ED. She wants Dr. Darrick Penna to review the CT and see if she has any recommendations.   She is scheduled an OV on 08/24/2013 at 2:30 PM with AS/

## 2013-08-14 NOTE — Telephone Encounter (Signed)
Pt returned call and is aware of the Rx for Flagyl.

## 2013-08-14 NOTE — Telephone Encounter (Signed)
LMOM that Rx for Flagyl has been sent to the pharmacy and to please call and let me know she got this message.

## 2013-08-15 LAB — URINE CULTURE: Colony Count: >=100000

## 2013-08-18 ENCOUNTER — Emergency Department (HOSPITAL_COMMUNITY): Payer: PRIVATE HEALTH INSURANCE

## 2013-08-18 ENCOUNTER — Telehealth (INDEPENDENT_AMBULATORY_CARE_PROVIDER_SITE_OTHER): Payer: Self-pay | Admitting: Internal Medicine

## 2013-08-18 ENCOUNTER — Inpatient Hospital Stay (HOSPITAL_COMMUNITY)
Admission: EM | Admit: 2013-08-18 | Discharge: 2013-08-22 | DRG: 392 | Disposition: A | Discharge status: Home / Self Care | Payer: PRIVATE HEALTH INSURANCE | Attending: Family Medicine | Admitting: Family Medicine

## 2013-08-18 ENCOUNTER — Encounter (HOSPITAL_COMMUNITY): Payer: Self-pay | Admitting: Emergency Medicine

## 2013-08-18 DIAGNOSIS — Z886 Allergy status to analgesic agent status: Secondary | ICD-10-CM

## 2013-08-18 DIAGNOSIS — R112 Nausea with vomiting, unspecified: Secondary | ICD-10-CM

## 2013-08-18 DIAGNOSIS — R109 Unspecified abdominal pain: Principal | ICD-10-CM | POA: Diagnosis present

## 2013-08-18 DIAGNOSIS — F4310 Post-traumatic stress disorder, unspecified: Secondary | ICD-10-CM | POA: Diagnosis present

## 2013-08-18 DIAGNOSIS — F909 Attention-deficit hyperactivity disorder, unspecified type: Secondary | ICD-10-CM | POA: Diagnosis present

## 2013-08-18 DIAGNOSIS — F17200 Nicotine dependence, unspecified, uncomplicated: Secondary | ICD-10-CM | POA: Diagnosis present

## 2013-08-18 DIAGNOSIS — Z87440 Personal history of urinary (tract) infections: Secondary | ICD-10-CM

## 2013-08-18 DIAGNOSIS — Z885 Allergy status to narcotic agent status: Secondary | ICD-10-CM

## 2013-08-18 DIAGNOSIS — K560 Paralytic ileus: Secondary | ICD-10-CM | POA: Diagnosis present

## 2013-08-18 DIAGNOSIS — Z79899 Other long term (current) drug therapy: Secondary | ICD-10-CM

## 2013-08-18 DIAGNOSIS — K589 Irritable bowel syndrome without diarrhea: Secondary | ICD-10-CM | POA: Diagnosis present

## 2013-08-18 DIAGNOSIS — E860 Dehydration: Secondary | ICD-10-CM | POA: Diagnosis present

## 2013-08-18 DIAGNOSIS — K5900 Constipation, unspecified: Secondary | ICD-10-CM | POA: Diagnosis present

## 2013-08-18 DIAGNOSIS — R1110 Vomiting, unspecified: Secondary | ICD-10-CM | POA: Diagnosis present

## 2013-08-18 DIAGNOSIS — K219 Gastro-esophageal reflux disease without esophagitis: Secondary | ICD-10-CM | POA: Diagnosis present

## 2013-08-18 DIAGNOSIS — Z882 Allergy status to sulfonamides status: Secondary | ICD-10-CM

## 2013-08-18 DIAGNOSIS — K5289 Other specified noninfective gastroenteritis and colitis: Secondary | ICD-10-CM | POA: Diagnosis present

## 2013-08-18 DIAGNOSIS — J449 Chronic obstructive pulmonary disease, unspecified: Secondary | ICD-10-CM | POA: Diagnosis present

## 2013-08-18 DIAGNOSIS — Z880 Allergy status to penicillin: Secondary | ICD-10-CM

## 2013-08-18 DIAGNOSIS — J4489 Other specified chronic obstructive pulmonary disease: Secondary | ICD-10-CM | POA: Diagnosis present

## 2013-08-18 DIAGNOSIS — Z8719 Personal history of other diseases of the digestive system: Secondary | ICD-10-CM

## 2013-08-18 LAB — URINALYSIS, ROUTINE W REFLEX MICROSCOPIC
Bilirubin Urine: NEGATIVE
Hgb urine dipstick: NEGATIVE
Ketones, ur: 15 mg/dL — AB
Nitrite: NEGATIVE
Protein, ur: NEGATIVE mg/dL
Specific Gravity, Urine: 1.015 (ref 1.005–1.030)
pH: 8 (ref 5.0–8.0)

## 2013-08-18 LAB — CBC WITH DIFFERENTIAL/PLATELET
Basophils Absolute: 0 10*3/uL (ref 0.0–0.1)
HCT: 41.2 % (ref 36.0–46.0)
Hemoglobin: 13.5 g/dL (ref 12.0–15.0)
Lymphs Abs: 1.1 10*3/uL (ref 0.7–4.0)
MCH: 30.3 pg (ref 26.0–34.0)
Monocytes Relative: 6 % (ref 3–12)
Neutro Abs: 8.3 10*3/uL — ABNORMAL HIGH (ref 1.7–7.7)
Neutrophils Relative %: 83 % — ABNORMAL HIGH (ref 43–77)
Platelets: 335 10*3/uL (ref 150–400)
RBC: 4.45 MIL/uL (ref 3.87–5.11)
WBC: 10 10*3/uL (ref 4.0–10.5)

## 2013-08-18 LAB — LIPASE, BLOOD: Lipase: 13 U/L (ref 11–59)

## 2013-08-18 LAB — COMPREHENSIVE METABOLIC PANEL
ALT: 26 U/L (ref 0–35)
Albumin: 4.1 g/dL (ref 3.5–5.2)
Alkaline Phosphatase: 81 U/L (ref 39–117)
BUN: 10 mg/dL (ref 6–23)
Calcium: 10.3 mg/dL (ref 8.4–10.5)
Chloride: 101 mEq/L (ref 96–112)
Creatinine, Ser: 0.65 mg/dL (ref 0.50–1.10)
Glucose, Bld: 146 mg/dL — ABNORMAL HIGH (ref 70–99)
Potassium: 3.9 mEq/L (ref 3.5–5.1)
Sodium: 139 mEq/L (ref 135–145)
Total Bilirubin: 0.5 mg/dL (ref 0.3–1.2)
Total Protein: 7.2 g/dL (ref 6.0–8.3)

## 2013-08-18 MED ORDER — ONDANSETRON HCL 4 MG/2ML IJ SOLN
4.0000 mg | Freq: Once | INTRAMUSCULAR | Status: DC
  Filled 2013-08-18: qty 2

## 2013-08-18 MED ORDER — PROMETHAZINE HCL 25 MG/ML IJ SOLN
12.5000 mg | Freq: Four times a day (QID) | INTRAMUSCULAR | Status: DC | PRN
  Administered 2013-08-18 – 2013-08-21 (×6): 12.5 mg via INTRAVENOUS
  Filled 2013-08-18 (×6): qty 1

## 2013-08-18 MED ORDER — HYDROMORPHONE HCL PF 1 MG/ML IJ SOLN
1.0000 mg | Freq: Once | INTRAMUSCULAR | Status: AC
  Administered 2013-08-18: 1 mg via INTRAVENOUS
  Filled 2013-08-18: qty 1

## 2013-08-18 MED ORDER — MIRTAZAPINE 30 MG PO TABS
15.0000 mg | ORAL_TABLET | Freq: Every day | ORAL | Status: DC
  Administered 2013-08-19 – 2013-08-21 (×3): 15 mg via ORAL
  Filled 2013-08-18 (×3): qty 1

## 2013-08-18 MED ORDER — ONDANSETRON 8 MG PO TBDP
8.0000 mg | ORAL_TABLET | Freq: Once | ORAL | Status: AC
  Administered 2013-08-18: 8 mg via ORAL
  Filled 2013-08-18: qty 1

## 2013-08-18 MED ORDER — SODIUM CHLORIDE 0.9 % IV SOLN: INTRAVENOUS | Status: DC

## 2013-08-18 MED ORDER — GABAPENTIN 300 MG PO CAPS
900.0000 mg | ORAL_CAPSULE | Freq: Every day | ORAL | Status: DC
  Administered 2013-08-19 – 2013-08-21 (×3): 900 mg via ORAL
  Filled 2013-08-18 (×3): qty 3

## 2013-08-18 MED ORDER — SODIUM CHLORIDE 0.9 % IV BOLUS (SEPSIS)
1000.0000 mL | Freq: Once | INTRAVENOUS | Status: AC
  Administered 2013-08-18: 1000 mL via INTRAVENOUS

## 2013-08-18 MED ORDER — DULOXETINE HCL 60 MG PO CPEP
120.0000 mg | ORAL_CAPSULE | Freq: Every day | ORAL | Status: DC
  Administered 2013-08-19 – 2013-08-22 (×4): 120 mg via ORAL
  Filled 2013-08-18 (×4): qty 2

## 2013-08-18 MED ORDER — POLYETHYLENE GLYCOL 3350 17 G PO PACK
17.0000 g | PACK | Freq: Every day | ORAL | Status: DC
  Administered 2013-08-19 – 2013-08-21 (×3): 17 g via ORAL
  Filled 2013-08-18 (×5): qty 1

## 2013-08-18 MED ORDER — AMPHETAMINE-DEXTROAMPHET ER 5 MG PO CP24: 15.0000 mg | ORAL_CAPSULE | Freq: Every day | ORAL | Status: DC

## 2013-08-18 MED ORDER — ENOXAPARIN SODIUM 40 MG/0.4ML ~~LOC~~ SOLN
40.0000 mg | SUBCUTANEOUS | Status: DC
  Administered 2013-08-18 – 2013-08-21 (×4): 40 mg via SUBCUTANEOUS
  Filled 2013-08-18 (×3): qty 0.4

## 2013-08-18 MED ORDER — MONTELUKAST SODIUM 10 MG PO TABS
10.0000 mg | ORAL_TABLET | Freq: Every day | ORAL | Status: DC
  Administered 2013-08-19 – 2013-08-22 (×4): 10 mg via ORAL
  Filled 2013-08-18 (×4): qty 1

## 2013-08-18 MED ORDER — ACETAMINOPHEN 325 MG PO TABS: 650.0000 mg | ORAL_TABLET | Freq: Four times a day (QID) | ORAL | Status: DC | PRN

## 2013-08-18 MED ORDER — DICYCLOMINE HCL 10 MG PO CAPS
10.0000 mg | ORAL_CAPSULE | Freq: Three times a day (TID) | ORAL | Status: DC
  Administered 2013-08-19 – 2013-08-20 (×4): 10 mg via ORAL
  Filled 2013-08-18 (×5): qty 1

## 2013-08-18 MED ORDER — LIDOCAINE 5 % EX PTCH
1.0000 | MEDICATED_PATCH | Freq: Every day | CUTANEOUS | Status: DC | PRN
  Filled 2013-08-18: qty 1

## 2013-08-18 MED ORDER — GABAPENTIN 300 MG PO CAPS
300.0000 mg | ORAL_CAPSULE | Freq: Every day | ORAL | Status: DC
  Administered 2013-08-19 – 2013-08-22 (×4): 300 mg via ORAL
  Filled 2013-08-18 (×5): qty 1

## 2013-08-18 MED ORDER — NICOTINE 21 MG/24HR TD PT24
21.0000 mg | MEDICATED_PATCH | Freq: Every day | TRANSDERMAL | Status: DC
  Administered 2013-08-19 – 2013-08-22 (×4): 21 mg via TRANSDERMAL
  Filled 2013-08-18 (×5): qty 1

## 2013-08-18 MED ORDER — BOOST / RESOURCE BREEZE PO LIQD
1.0000 | Freq: Two times a day (BID) | ORAL | Status: DC
  Administered 2013-08-19 (×2): 1 via ORAL

## 2013-08-18 MED ORDER — HYDROCODONE-ACETAMINOPHEN 5-325 MG PO TABS
1.0000 | ORAL_TABLET | Freq: Four times a day (QID) | ORAL | Status: DC | PRN
  Administered 2013-08-19 (×4): 1 via ORAL
  Administered 2013-08-20 – 2013-08-21 (×3): 2 via ORAL
  Administered 2013-08-21: 1 via ORAL
  Administered 2013-08-22 (×2): 2 via ORAL
  Filled 2013-08-18 (×2): qty 1
  Filled 2013-08-18 (×2): qty 2
  Filled 2013-08-18: qty 1
  Filled 2013-08-18 (×2): qty 2
  Filled 2013-08-18 (×2): qty 1
  Filled 2013-08-18: qty 2

## 2013-08-18 MED ORDER — PANTOPRAZOLE SODIUM 40 MG PO TBEC
80.0000 mg | DELAYED_RELEASE_TABLET | Freq: Every day | ORAL | Status: DC
  Administered 2013-08-19 – 2013-08-22 (×4): 80 mg via ORAL
  Filled 2013-08-18 (×5): qty 2

## 2013-08-18 MED ORDER — CYCLOBENZAPRINE HCL 10 MG PO TABS: 5.0000 mg | ORAL_TABLET | Freq: Three times a day (TID) | ORAL | Status: DC | PRN

## 2013-08-18 MED ORDER — SODIUM CHLORIDE 0.9 % IV SOLN
INTRAVENOUS | Status: DC
  Administered 2013-08-19 – 2013-08-21 (×4): via INTRAVENOUS

## 2013-08-18 MED ORDER — ALBUTEROL SULFATE HFA 108 (90 BASE) MCG/ACT IN AERS: 2.0000 | INHALATION_SPRAY | Freq: Four times a day (QID) | RESPIRATORY_TRACT | Status: DC | PRN

## 2013-08-18 MED ORDER — LORAZEPAM 2 MG/ML IJ SOLN
1.0000 mg | Freq: Once | INTRAMUSCULAR | Status: AC
  Administered 2013-08-18: 1 mg via INTRAVENOUS
  Filled 2013-08-18: qty 1

## 2013-08-18 MED ORDER — MILK AND MOLASSES ENEMA: RECTAL | Status: AC

## 2013-08-18 MED ORDER — TOPIRAMATE 25 MG PO TABS
25.0000 mg | ORAL_TABLET | Freq: Three times a day (TID) | ORAL | Status: DC
  Administered 2013-08-19 – 2013-08-22 (×10): 25 mg via ORAL
  Filled 2013-08-18 (×17): qty 1

## 2013-08-18 MED ORDER — ALPRAZOLAM 1 MG PO TABS
1.0000 mg | ORAL_TABLET | Freq: Three times a day (TID) | ORAL | Status: DC
  Administered 2013-08-18 – 2013-08-22 (×11): 1 mg via ORAL
  Filled 2013-08-18 (×12): qty 1

## 2013-08-18 MED ORDER — ACETAMINOPHEN 650 MG RE SUPP: 650.0000 mg | Freq: Four times a day (QID) | RECTAL | Status: DC | PRN

## 2013-08-18 MED ORDER — ONDANSETRON HCL 4 MG/2ML IJ SOLN
4.0000 mg | Freq: Once | INTRAMUSCULAR | Status: AC
  Administered 2013-08-18: 4 mg via INTRAVENOUS

## 2013-08-18 NOTE — ED Provider Notes (Signed)
CSN: [id number]     Arrival date & time 08/18/13  4098 History  This chart was scribed for Donnetta Hutching, MD by Ronal Fear, ED Scribe. This patient was seen in room APA06/APA06 and the patient's care was started at 8:28 AM.    Chief Complaint  Patient presents with  . Abdominal Pain   (Consider location/radiation/quality/duration/timing/severity/associated sxs/prior Treatment) The history is provided by the patient. No language interpreter was used.  HPI Comments: [name] is a 52 y.o. female who presents to the Emergency Department complaining of lower abdominal pain and vomiting and diarhea with associated bloating and tenderness onset 5x days.She has had 3x episodes of emesis this morning. Pt states that she cannot drink any fluids or take her nausea medication. Pt is taking phenergan and Zofran that she received from the ER 3x days ago.  She has a Hx of gastroenteritis diagnosis 1x week ago. Pt had a bowel resection followed by SBO 12x years ago. Pt appears to be in some discomfort with no other complaints.   Past Medical History  Diagnosis Date  . S/P colonoscopy 06/02/09    normal (Dr. Linna Darner)  . PUD (peptic ulcer disease) 01/2007    EGD Dr Jena Gauss, 2 antral ulcers, negative h pylori  . MRSA infection     Dr. Lenice Pressman, currently under treatment  . Hiatal hernia 2008    on EGD above  . Asthma   . PTSD (post-traumatic stress disorder)   . Migraines   . Degenerative disc disease   . Rectal prolapse   . SBO (small bowel obstruction)   . ADHD (attention deficit hyperactivity disorder)   . S/P endoscopy 03/26/11    gastritis-Dr Darrick Penna  . Clostridium difficile colitis 04/10/11 &11/2011    tx w/ flagyl  . Kidney stones   . Chronic abdominal pain 2003    EX LAP APR 2004 RUPTURE L OV CYST, JUL 2004 ADHESIONS  . Irritable bowel syndrome 2004 CONSTIPATION  . BMI (body mass index) 20.0-29.9 2009 127 LBS  . COPD (chronic obstructive pulmonary disease)   . Chronic nausea   .  Medical history non-contributory   . Coronary artery disease   . Myocardial infarction   . Anginal pain    Past Surgical History  Procedure Laterality Date  . Bowel resection      x2, secondary to adhesions  . Appendectomy    . Laparoscopic lysis intestinal adhesions    . Back surgery    . Abdominal hysterectomy    . Tubal ligation    . Umbilical hernia repair  2008    x5  . Neck surgery  2009  . Partial hysterectomy    . Upper gastrointestinal endoscopy  APR 2008 [facility] BLEEDING/PAIN    PUD  . Upper gastrointestinal endoscopy  JUL 2012 [facility] PAIN    MILD GASTRITIS  . Esophagogastroduodenoscopy  03/23/2011     Normal esophagus without evidence of Barrett's, mass, erosions, or ulcerations./ Patchy erythema with occasional erosion in the antrum.  Biopsies  obtained via cold forceps to evaluate for H. pylori gastritis/ Small hiatal hernia./Normal duodenal bulb and second portion of the duodenum.  . Cervical spine surgery    . Flexible sigmoidoscopy  07/14/2002    Normal limited flexible sigmoidoscopy with stool in the rectum and rectosigmoid precluding a full colonoscopy  . Colonoscopy  03/21/2012     JXB:[id number] polyp in the transverse colon/Sessile polyp in the rectum/Internal hemorrhoids/  . Esophagogastroduodenoscopy  03/21/2012    AOZ:HYQMV Hiatal hernia/ABDOMINALPAIN/DIARRHEA  MOST LIKELY DUE TO IBS, GASTRITIS DUODENITIS   Family History  Problem Relation Age of Onset  . Adopted: Yes   History  Substance Use Topics  . Smoking status: Current Every Day Smoker -- 1.00 packs/day for 1 years    Types: Cigarettes  . Smokeless tobacco: Former Neurosurgeon  . Alcohol Use: No   OB History   Grav Para Term Preterm Abortions TAB SAB Ect Mult Living   1 1 1       1      Review of Systems  Gastrointestinal: Positive for nausea, vomiting, abdominal pain and diarrhea.  All other systems reviewed and are negative.    Allergies  Penicillins; Sulfa antibiotics; Aspirin; Ibuprofen; Lactaid;  and Morphine and related  Home Medications   Current Outpatient Rx  Name  Route  Sig  Dispense  Refill  . albuterol (PROVENTIL,VENTOLIN) 90 MCG/ACT inhaler   Inhalation   Inhale 2 puffs into the lungs every 6 (six) hours as needed. For shortness of breath         . ALPRAZolam (XANAX) 1 MG tablet   Oral   Take 1-2 mg by mouth 3 (three) times daily. Patient takes 1 tablet twice a day and 2 tablets at bedtime         . amphetamine-dextroamphetamine (ADDERALL XR) 15 MG 24 hr capsule   Oral   Take 15 mg by mouth daily.          . ciprofloxacin (CIPRO) 500 MG tablet   Oral   Take 1 tablet (500 mg total) by mouth 2 (two) times daily.   14 tablet   0   . cyclobenzaprine (FLEXERIL) 5 MG tablet   Oral   Take 5 mg by mouth every 8 (eight) hours as needed for muscle spasms.         Marland Kitchen dexlansoprazole (DEXILANT) 60 MG capsule   Oral   Take 60 mg by mouth every morning.          . dicyclomine (BENTYL) 10 MG capsule   Oral   Take 10 mg by mouth 3 (three) times daily.         . DULoxetine (CYMBALTA) 60 MG capsule   Oral   Take 120 mg by mouth every morning.          . gabapentin (NEURONTIN) 300 MG capsule   Oral   Take 300-900 mg by mouth 2 (two) times daily. 1 capsule in the morning and 3 capsules at bedtime         . HYDROcodone-acetaminophen (NORCO) 10-325 MG per tablet   Oral   Take 1 tablet by mouth 4 (four) times daily.         Marland Kitchen HYDROcodone-acetaminophen (NORCO/VICODIN) 5-325 MG per tablet   Oral   Take 1-2 tablets by mouth every 6 (six) hours as needed for moderate pain.   20 tablet   0   . Lactobacillus (ACIDOPHILUS) CAPS   Oral   Take 2 capsules by mouth daily.          Marland Kitchen lidocaine (LIDODERM) 5 %   Transdermal   Place 1 patch onto the skin daily as needed. Uses for pain in back. Not used regularly.         . loperamide (IMODIUM A-D) 2 MG tablet   Oral   Take 1 tablet (2 mg total) by mouth 4 (four) times daily as needed for diarrhea or  loose stools.   30 tablet   0   .  metroNIDAZOLE (FLAGYL) 500 MG tablet      1 PO TID FOR 21 DAYS   63 tablet   0   . montelukast (SINGULAIR) 10 MG tablet   Oral   Take 10 mg by mouth daily.          . Multiple Vitamin (MULTIVITAMIN WITH MINERALS) TABS   Oral   Take 1 tablet by mouth daily.         . nicotine (NICODERM CQ - DOSED IN MG/24 HOURS) 21 mg/24hr patch   Transdermal   Place 1 patch onto the skin daily.   28 patch   0   . ondansetron (ZOFRAN ODT) 4 MG disintegrating tablet   Oral   Take 1 tablet (4 mg total) by mouth every 8 (eight) hours as needed.   12 tablet   1   . promethazine (PHENERGAN) 25 MG tablet   Oral   Take 1 tablet (25 mg total) by mouth every 6 (six) hours as needed for nausea or vomiting.   12 tablet   0   . topiramate (TOPAMAX) 25 MG tablet   Oral   Take 25 mg by mouth 3 (three) times daily.         . traMADol (ULTRAM) 50 MG tablet   Oral   Take 50 mg by mouth every 8 (eight) hours as needed. For pain         . zolpidem (AMBIEN CR) 12.5 MG CR tablet   Oral   Take 12.5 mg by mouth at bedtime as needed. For sleep          BP 122/54  Pulse 60  Temp(Src) 99.3 F (37.4 C) (Oral)  Resp 16  SpO2 97% Physical Exam  Nursing note and vitals reviewed. Constitutional: She is oriented to person, place, and time. She appears well-developed and well-nourished.  HENT:  Head: Normocephalic and atraumatic.  Eyes: Conjunctivae and EOM are normal. Pupils are equal, round, and reactive to light.  Neck: Normal range of motion. Neck supple.  Cardiovascular: Normal rate, regular rhythm and normal heart sounds.   Pulmonary/Chest: Effort normal and breath sounds normal.  Abdominal: Soft. Bowel sounds are normal.  Musculoskeletal: Normal range of motion.  Neurological: She is alert and oriented to person, place, and time.  Skin: Skin is warm and dry.  Psychiatric: She has a normal mood and affect.    ED Course  Procedures (including  critical care time) DIAGNOSTIC STUDIES: .    COORDINATION OF CARE:    8:33 AM- Pt advised of plan for treatment including zofran, acute abdominal series and pt agrees.  Labs Review Labs Reviewed  CBC WITH DIFFERENTIAL - Abnormal; Notable for the following:    Neutrophils Relative % 83 (*)    Neutro Abs 8.3 (*)    Lymphocytes Relative 11 (*)    All other components within normal limits  COMPREHENSIVE METABOLIC PANEL - Abnormal; Notable for the following:    Glucose, Bld 146 (*)    All other components within normal limits  URINALYSIS, ROUTINE W REFLEX MICROSCOPIC - Abnormal; Notable for the following:    APPearance CLOUDY (*)    Ketones, ur 15 (*)    All other components within normal limits  LIPASE, BLOOD   Imaging Review Dg Abd Acute W/chest  08/18/2013   CLINICAL DATA:  Abdominal pain. Irritable bowel syndrome.  EXAM: ACUTE ABDOMEN SERIES (ABDOMEN 2 VIEW & CHEST 1 VIEW)  COMPARISON:  PA and lateral chest 06/08/2013 and CT abdomen and  pelvis 08/13/2013.  FINDINGS: Single view of the chest demonstrates a small focus upper atelectasis or scar in the left lung base. The lungs appear emphysematous but are otherwise clear. No pneumothorax or pleural fluid is identified.  Two views of the abdomen show no free intraperitoneal air. There appears to be a large volume of stool throughout the colon. No evidence of bowel obstruction is identified. The patient is status post hernia repair and lower lumbar fusion.  IMPRESSION: No acute finding chest or abdomen.  Large volume of stool throughout the colon.  Possible emphysema.   Electronically Signed   By: Drusilla Kanner M.D.   On: 08/18/2013 09:55    EKG Interpretation   None       MDM  No diagnosis found. Patient appears clinically dehydrated. She has had prior abdominal surgery. X-ray shows no bowel obstruction. Will hydrate, treat pain and nausea, admit to general medicine  I personally performed the services described in this  documentation, which was scribed in my presence. The recorded information has been reviewed and is accurate.    Donnetta Hutching, MD 08/18/13 [phone]

## 2013-08-18 NOTE — Telephone Encounter (Signed)
REVIEWED.  

## 2013-08-18 NOTE — H&P (Signed)
[facility] History and Physical  [name] HQI:[id number] DOB: 10/30/1960 DOA: 08/18/2013  Referring physician: Dr. Adriana Simas, ER physician PCP: Elby Showers, MD  Specialists:   Chief Complaint: Vomiting, abdominal pain  HPI: [name] is a 52 y.o. female who presents to the emergency room with complaints of vomiting abdominal pain. She reports onset of her symptoms occurring approximately one week ago and progressively gotten worse. It appears that she was seen in the emergency room at that time CT scan did show some mild ileus, but no evidence of bowel obstruction. She was also diagnosed with UTI at that time prescribed antibiotics. She does not report having any fevers. She's had continued vomiting has progressively gotten worse. She's not been able to tolerate any by mouth. She describes the generalized abdominal pain. She reports that her bowels on a daily basis. She is on multiple pain medications for chronic back pain. She's not had any shortness of breath, cough, chest pain. She's been unable to tolerate any by mouth. X-ray done in the emergency room today did not reveal any signs of obstruction but did indicate a significant stool burden in the colon. The patient has been referred for admission.  Review of Systems: Pertinent positives as per history of present illness, otherwise negative  Past Medical History  Diagnosis Date  . S/P colonoscopy 06/02/09    normal (Dr. Linna Darner)  . PUD (peptic ulcer disease) 01/2007    EGD Dr Jena Gauss, 2 antral ulcers, negative h pylori  . MRSA infection     Dr. Lenice Pressman, currently under treatment  . Hiatal hernia 2008    on EGD above  . Asthma   . PTSD (post-traumatic stress disorder)   . Migraines   . Degenerative disc disease   . Rectal prolapse   . SBO (small bowel obstruction)   . ADHD (attention deficit hyperactivity disorder)   . S/P endoscopy 03/26/11    gastritis-Dr Darrick Penna  . Clostridium difficile colitis 04/10/11  &11/2011    tx w/ flagyl  . Chronic abdominal pain 2003    EX LAP APR 2004 RUPTURE L OV CYST, JUL 2004 ADHESIONS  . Irritable bowel syndrome 2004 CONSTIPATION  . BMI (body mass index) 20.0-29.9 2009 127 LBS  . COPD (chronic obstructive pulmonary disease)   . Chronic nausea   . Anginal pain    Past Surgical History  Procedure Laterality Date  . Bowel resection      x2, secondary to adhesions  . Appendectomy    . Laparoscopic lysis intestinal adhesions    . Back surgery    . Abdominal hysterectomy    . Tubal ligation    . Umbilical hernia repair  2008    x5  . Neck surgery  2009  . Partial hysterectomy    . Upper gastrointestinal endoscopy  APR 2008 [facility] BLEEDING/PAIN    PUD  . Upper gastrointestinal endoscopy  JUL 2012 [facility] PAIN    MILD GASTRITIS  . Esophagogastroduodenoscopy  03/23/2011     Normal esophagus without evidence of Barrett's, mass, erosions, or ulcerations./ Patchy erythema with occasional erosion in the antrum.  Biopsies  obtained via cold forceps to evaluate for H. pylori gastritis/ Small hiatal hernia./Normal duodenal bulb and second portion of the duodenum.  . Cervical spine surgery    . Flexible sigmoidoscopy  07/14/2002    Normal limited flexible sigmoidoscopy with stool in the rectum and rectosigmoid precluding a full colonoscopy  . Colonoscopy  03/21/2012     [id number]:[id number] polyp in the  transverse colon/Sessile polyp in the rectum/Internal hemorrhoids/  . Esophagogastroduodenoscopy  03/21/2012    ZOX:WRUEA Hiatal hernia/ABDOMINALPAIN/DIARRHEA MOST LIKELY DUE TO IBS, GASTRITIS DUODENITIS   Social History:  reports that she has been smoking Cigarettes.  She has a 1 pack-year smoking history. She has quit using smokeless tobacco. She reports that she does not drink alcohol or use illicit drugs.   Allergies  Allergen Reactions  . Penicillins Anaphylaxis    REACTION: anaphylaxis  . Sulfa Antibiotics Itching  . Aspirin Other (See Comments)    Burning of stomach.  REACTION: GI upset  . Ibuprofen Nausea Only    Upset stomach due to acid reflux  . Lactaid [Lactase] Diarrhea and Other (See Comments)    Stomach cramping  . Morphine And Related Itching    Family History  Problem Relation Age of Onset  . Adopted: Yes     Prior to Admission medications   Medication Sig Start Date End Date Taking? Authorizing Provider  albuterol (PROVENTIL,VENTOLIN) 90 MCG/ACT inhaler Inhale 2 puffs into the lungs every 6 (six) hours as needed. For shortness of breath   Yes Historical Provider, MD  ALPRAZolam Prudy Feeler) 1 MG tablet Take 1-2 mg by mouth 3 (three) times daily. Patient takes 1 tablet twice a day and 2 tablets at bedtime   Yes Historical Provider, MD  amphetamine-dextroamphetamine (ADDERALL XR) 15 MG 24 hr capsule Take 15 mg by mouth daily.    Yes Historical Provider, MD  ciprofloxacin (CIPRO) 500 MG tablet Take 1 tablet (500 mg total) by mouth 2 (two) times daily. 08/13/13  Yes Shelda Jakes, MD  cyclobenzaprine (FLEXERIL) 5 MG tablet Take 5 mg by mouth every 8 (eight) hours as needed for muscle spasms.   Yes Historical Provider, MD  dexlansoprazole (DEXILANT) 60 MG capsule Take 60 mg by mouth every morning.    Yes Historical Provider, MD  dicyclomine (BENTYL) 10 MG capsule Take 10 mg by mouth 3 (three) times daily.   Yes Historical Provider, MD  DULoxetine (CYMBALTA) 60 MG capsule Take 120 mg by mouth every morning.    Yes Historical Provider, MD  gabapentin (NEURONTIN) 300 MG capsule Take 300-900 mg by mouth 2 (two) times daily. 1 capsule in the morning and 3 capsules at bedtime   Yes Historical Provider, MD  HYDROcodone-acetaminophen (NORCO/VICODIN) 5-325 MG per tablet Take 1-2 tablets by mouth every 6 (six) hours as needed for moderate pain. 08/13/13  Yes Shelda Jakes, MD  Lactobacillus (ACIDOPHILUS) CAPS Take 2 capsules by mouth daily.    Yes Historical Provider, MD  lidocaine (LIDODERM) 5 % Place 1 patch onto the skin daily as needed. Uses for pain  in back. Not used regularly.   Yes Historical Provider, MD  loperamide (IMODIUM A-D) 2 MG tablet Take 1 tablet (2 mg total) by mouth 4 (four) times daily as needed for diarrhea or loose stools. 08/13/13  Yes Shelda Jakes, MD  metroNIDAZOLE (FLAGYL) 500 MG tablet Take 500 mg by mouth 3 (three) times daily.   Yes Historical Provider, MD  mirtazapine (REMERON) 15 MG tablet Take 1 tablet by mouth at bedtime. 08/17/13  Yes Historical Provider, MD  montelukast (SINGULAIR) 10 MG tablet Take 10 mg by mouth daily.  01/14/12  Yes Historical Provider, MD  Multiple Vitamin (MULTIVITAMIN WITH MINERALS) TABS Take 1 tablet by mouth daily.   Yes Historical Provider, MD  nicotine (NICODERM CQ - DOSED IN MG/24 HOURS) 21 mg/24hr patch Place 1 patch onto the skin daily. 06/10/13  Yes  Standley Brooking, MD  ondansetron (ZOFRAN ODT) 4 MG disintegrating tablet Take 1 tablet (4 mg total) by mouth every 8 (eight) hours as needed. 08/13/13  Yes Shelda Jakes, MD  promethazine (PHENERGAN) 25 MG tablet Take 1 tablet (25 mg total) by mouth every 6 (six) hours as needed for nausea or vomiting. 08/13/13  Yes Shelda Jakes, MD  topiramate (TOPAMAX) 25 MG tablet Take 25 mg by mouth 3 (three) times daily.   Yes Historical Provider, MD  traMADol (ULTRAM) 50 MG tablet Take 50 mg by mouth every 8 (eight) hours as needed. For pain 02/29/12  Yes Historical Provider, MD  zolpidem (AMBIEN CR) 12.5 MG CR tablet Take 12.5 mg by mouth at bedtime as needed. For sleep 03/11/12  Yes Historical Provider, MD   Physical Exam: Filed Vitals:   08/18/13 1642  BP: 114/69  Pulse: 67  Temp: 98.6 F (37 C)  Resp:      General:  Patient is lying in bed, hunched over emesis basin  Eyes: Pupils are equal, round, reactive to light  ENT: Mucous membranes are dry  Neck: Supple  Cardiovascular: S1, S2, regular rate and rhythm  Respiratory: Clear to auscultation bilaterally  Abdomen: Soft, diffusely tender, positive bowel sounds  Skin:  No rashes  Musculoskeletal: No pedal edema bilaterally  Psychiatric: Appears anxious, does not easily engage in conversation due to her ongoing nausea  Neurologic: Grossly intact, nonfocal  Labs on Admission:  Basic Metabolic Panel:  Recent Labs Lab 08/13/13 1133 08/18/13 0858  NA 144 139  K 3.5 3.9  CL 104 101  CO2 30 29  GLUCOSE 182* 146*  BUN 13 10  CREATININE 0.71 0.65  CALCIUM 10.7* 10.3   Liver Function Tests:  Recent Labs Lab 08/13/13 1133 08/18/13 0858  AST 29 25  ALT 20 26  ALKPHOS 74 81  BILITOT 0.3 0.5  PROT 7.4 7.2  ALBUMIN 4.4 4.1    Recent Labs Lab 08/13/13 1133 08/18/13 0858  LIPASE 19 13   No results found for this basename: AMMONIA,  in the last 168 hours CBC:  Recent Labs Lab 08/13/13 1133 08/18/13 0858  WBC 14.5* 10.0  NEUTROABS 12.9* 8.3*  HGB 14.0 13.5  HCT 43.5 41.2  MCV 94.0 92.6  PLT 322 335   Cardiac Enzymes: No results found for this basename: CKTOTAL, CKMB, CKMBINDEX, TROPONINI,  in the last 168 hours  BNP (last 3 results) No results found for this basename: PROBNP,  in the last 8760 hours CBG: No results found for this basename: GLUCAP,  in the last 168 hours  Radiological Exams on Admission: Dg Abd Acute W/chest  08/18/2013   CLINICAL DATA:  Abdominal pain. Irritable bowel syndrome.  EXAM: ACUTE ABDOMEN SERIES (ABDOMEN 2 VIEW & CHEST 1 VIEW)  COMPARISON:  PA and lateral chest 06/08/2013 and CT abdomen and pelvis 08/13/2013.  FINDINGS: Single view of the chest demonstrates a small focus upper atelectasis or scar in the left lung base. The lungs appear emphysematous but are otherwise clear. No pneumothorax or pleural fluid is identified.  Two views of the abdomen show no free intraperitoneal air. There appears to be a large volume of stool throughout the colon. No evidence of bowel obstruction is identified. The patient is status post hernia repair and lower lumbar fusion.  IMPRESSION: No acute finding chest or abdomen.   Large volume of stool throughout the colon.  Possible emphysema.   Electronically Signed   By: Drusilla Kanner M.D.  On: 08/18/2013 09:55    Assessment/Plan Active Problems:   GERD   VOMITING   CONSTIPATION, HX OF   Abdominal pain   1. Abdominal pain. Likely related to constipation. We'll give enemas as well as laxatives. She recently had a CT scan that did not indicate any signs of obstruction. Abdominal x-ray from today also shows constipation. We'll treat with enemas laxatives and observe. 2. Dehydration. We'll give IV fluids. 3. Vomiting. Likely related to constipation. Supportive treatment and antiemetics.  Code Status: full code Family Communication: discussed with patient and family Disposition Plan: discharge home once improved  Time spent:  MEMON,JEHANZEB [facility] Pager [phone]  If 7PM-7AM, please contact night-coverage [email or web address] Password [email or web address] 08/18/2013, 7:00 PM

## 2013-08-18 NOTE — Progress Notes (Addendum)
Patient refused milk of molasses enema.  Patient states she is very nauseated and doesn't feel like taking anything po tonight, as she feels as if she will not hold it down.

## 2013-08-18 NOTE — ED Notes (Signed)
x2 unsuccessful IV attempts made by Tarri Glenn RN . Lake Bells RN to room to attempt IV access.

## 2013-08-18 NOTE — ED Notes (Signed)
Pt arrived to er via [facility] with c/o abd pain, n/v/d, was seen in er on 08/13/2013, diagnosed with gastritis and uti, states that she is not any better,

## 2013-08-18 NOTE — Telephone Encounter (Signed)
Patient called me at 7 AM today to let me know that she was sick and could not wait for office to open she was going to emergency room. She complained of vomiting and abdominal pain.

## 2013-08-19 DIAGNOSIS — R109 Unspecified abdominal pain: Secondary | ICD-10-CM

## 2013-08-19 DIAGNOSIS — R112 Nausea with vomiting, unspecified: Secondary | ICD-10-CM

## 2013-08-19 DIAGNOSIS — K589 Irritable bowel syndrome without diarrhea: Secondary | ICD-10-CM

## 2013-08-19 LAB — CBC
HCT: 37.6 % (ref 36.0–46.0)
Hemoglobin: 12.2 g/dL (ref 12.0–15.0)
MCHC: 32.4 g/dL (ref 30.0–36.0)
MCV: 94 fL (ref 78.0–100.0)
RBC: 4 MIL/uL (ref 3.87–5.11)
WBC: 9.5 10*3/uL (ref 4.0–10.5)

## 2013-08-19 LAB — BASIC METABOLIC PANEL
CO2: 26 mEq/L (ref 19–32)
Chloride: 108 mEq/L (ref 96–112)
Creatinine, Ser: 0.55 mg/dL (ref 0.50–1.10)
GFR calc Af Amer: >90 mL/min (ref >90)
Potassium: 3.9 mEq/L (ref 3.5–5.1)

## 2013-08-19 MED ORDER — CHLORHEXIDINE GLUCONATE CLOTH 2 % EX PADS
6.0000 | MEDICATED_PAD | Freq: Every day | CUTANEOUS | Status: DC
  Administered 2013-08-20 – 2013-08-22 (×3): 6 via TOPICAL

## 2013-08-19 MED ORDER — BOOST / RESOURCE BREEZE PO LIQD
1.0000 | Freq: Three times a day (TID) | ORAL | Status: DC
  Administered 2013-08-21 – 2013-08-22 (×3): 1 via ORAL

## 2013-08-19 MED ORDER — MUPIROCIN 2 % EX OINT
1.0000 "application " | TOPICAL_OINTMENT | Freq: Two times a day (BID) | CUTANEOUS | Status: DC
  Administered 2013-08-19 – 2013-08-22 (×6): 1 via NASAL
  Filled 2013-08-19 (×2): qty 22

## 2013-08-19 NOTE — Consult Note (Signed)
Reason for Consult:nausea and vomiting Referring Physician: Hospitalist  [name] is an 52 y.o. female.  HPI: Admitted thru the ED last night. She has been having pain around her umbilicus x 1 week. The pain became increasing worse.  Recent acute abdomen revealed : No acute finding chest or abdomen.  Large volume of stool throughout the colon.  Possible emphysema. Today she feels the same. She is weak.She denies having a fever.She is having mid abdominal pain. She rates her pain 7/10.   She received an enema this pm.  She had one small BM. No vomiting since last night. She is tolerating clear liquids.  Five days ago she began to vomit.  She tells me she also had diarrhea. She began to have chills. She felt she was becoming dehydrated. She was seen in the ED on 08/13/2013.  She received 3 liters of fluid. She was also told she had a UTI and started on Cipro and Rx was given to her. At home she took Pepto Bismol for the diarrhea which helped. She denies urinary symptoms at this time. Urinalysis clean this visit.  She also underwent a CT scan which revealed: IMPRESSION:  Fluid filled mildly distended small and large bowel, suggesting  enteritis and mild ileus, this is unlikely to reflect a small bowel  obstruction. No bowel perforation. Trace ascites.      Past Medical History  Diagnosis Date  . S/P colonoscopy 06/02/09    normal (Dr. Linna Darner)  . PUD (peptic ulcer disease) 01/2007    EGD Dr Jena Gauss, 2 antral ulcers, negative h pylori  . MRSA infection     Dr. Lenice Pressman, currently under treatment  . Hiatal hernia 2008    on EGD above  . Asthma   . PTSD (post-traumatic stress disorder)   . Migraines   . Degenerative disc disease   . Rectal prolapse   . SBO (small bowel obstruction)   . ADHD (attention deficit hyperactivity disorder)   . S/P endoscopy 03/26/11    gastritis-Dr Darrick Penna  . Clostridium difficile colitis 04/10/11 &11/2011    tx w/ flagyl  . Chronic abdominal pain 2003     EX LAP APR 2004 RUPTURE L OV CYST, JUL 2004 ADHESIONS  . Irritable bowel syndrome 2004 CONSTIPATION  . BMI (body mass index) 20.0-29.9 2009 127 LBS  . COPD (chronic obstructive pulmonary disease)   . Chronic nausea   . Anginal pain     Past Surgical History  Procedure Laterality Date  . Bowel resection      x2, secondary to adhesions  . Appendectomy    . Laparoscopic lysis intestinal adhesions    . Back surgery    . Abdominal hysterectomy    . Tubal ligation    . Umbilical hernia repair  2008    x5  . Neck surgery  2009  . Partial hysterectomy    . Upper gastrointestinal endoscopy  APR 2008 [facility] BLEEDING/PAIN    PUD  . Upper gastrointestinal endoscopy  JUL 2012 [facility] PAIN    MILD GASTRITIS  . Esophagogastroduodenoscopy  03/23/2011     Normal esophagus without evidence of Barrett's, mass, erosions, or ulcerations./ Patchy erythema with occasional erosion in the antrum.  Biopsies  obtained via cold forceps to evaluate for H. pylori gastritis/ Small hiatal hernia./Normal duodenal bulb and second portion of the duodenum.  . Cervical spine surgery    . Flexible sigmoidoscopy  07/14/2002    Normal limited flexible sigmoidoscopy with stool in the rectum and  rectosigmoid precluding a full colonoscopy  . Colonoscopy  03/21/2012     ZOX:WRUEAVW polyp in the transverse colon/Sessile polyp in the rectum/Internal hemorrhoids/  . Esophagogastroduodenoscopy  03/21/2012    UJW:JXBJY Hiatal hernia/ABDOMINALPAIN/DIARRHEA MOST LIKELY DUE TO IBS, GASTRITIS DUODENITIS    Family History  Problem Relation Age of Onset  . Adopted: Yes    Social History:  reports that she has been smoking Cigarettes.  She has a 1 pack-year smoking history. She has quit using smokeless tobacco. She reports that she does not drink alcohol or use illicit drugs.  Allergies:  Allergies  Allergen Reactions  . Penicillins Anaphylaxis    REACTION: anaphylaxis  . Sulfa Antibiotics Itching  . Aspirin Other (See  Comments)    Burning of stomach. REACTION: GI upset  . Ibuprofen Nausea Only    Upset stomach due to acid reflux  . Lactaid [Lactase] Diarrhea and Other (See Comments)    Stomach cramping  . Morphine And Related Itching    Medications: I have reviewed the patient's current medications.  Results for orders placed during the hospital encounter of 08/18/13 (from the past 48 hour(s))  CBC WITH DIFFERENTIAL     Status: Abnormal   Collection Time    08/18/13  8:58 AM      Result Value Range   WBC 10.0  4.0 - 10.5 K/uL   RBC 4.45  3.87 - 5.11 MIL/uL   Hemoglobin 13.5  12.0 - 15.0 g/dL   HCT 78.2  95.6 - 21.3 %   MCV 92.6  78.0 - 100.0 fL   MCH 30.3  26.0 - 34.0 pg   MCHC 32.8  30.0 - 36.0 g/dL   RDW 08.6  57.8 - 46.9 %   Platelets 335  150 - 400 K/uL   Neutrophils Relative % 83 (*) 43 - 77 %   Neutro Abs 8.3 (*) 1.7 - 7.7 K/uL   Lymphocytes Relative 11 (*) 12 - 46 %   Lymphs Abs 1.1  0.7 - 4.0 K/uL   Monocytes Relative 6  3 - 12 %   Monocytes Absolute 0.6  0.1 - 1.0 K/uL   Eosinophils Relative 0  0 - 5 %   Eosinophils Absolute 0.0  0.0 - 0.7 K/uL   Basophils Relative 0  0 - 1 %   Basophils Absolute 0.0  0.0 - 0.1 K/uL  COMPREHENSIVE METABOLIC PANEL     Status: Abnormal   Collection Time    08/18/13  8:58 AM      Result Value Range   Sodium 139  135 - 145 mEq/L   Potassium 3.9  3.5 - 5.1 mEq/L   Chloride 101  96 - 112 mEq/L   CO2 29  19 - 32 mEq/L   Glucose, Bld 146 (*) 70 - 99 mg/dL   BUN 10  6 - 23 mg/dL   Creatinine, Ser 6.29  0.50 - 1.10 mg/dL   Calcium 52.8  8.4 - 41.3 mg/dL   Total Protein 7.2  6.0 - 8.3 g/dL   Albumin 4.1  3.5 - 5.2 g/dL   AST 25  0 - 37 U/L   ALT 26  0 - 35 U/L   Alkaline Phosphatase 81  39 - 117 U/L   Total Bilirubin 0.5  0.3 - 1.2 mg/dL   GFR calc non Af Amer >90  >90 mL/min   GFR calc Af Amer >90  >90 mL/min   Comment: (NOTE)     The eGFR has been calculated  using the CKD EPI equation.     This calculation has not been validated in all  clinical situations.     eGFR's persistently <90 mL/min signify possible Chronic Kidney     Disease.  LIPASE, BLOOD     Status: None   Collection Time    08/18/13  8:58 AM      Result Value Range   Lipase 13  11 - 59 U/L  URINALYSIS, ROUTINE W REFLEX MICROSCOPIC     Status: Abnormal   Collection Time    08/18/13 11:08 AM      Result Value Range   Color, Urine YELLOW  YELLOW   APPearance CLOUDY (*) CLEAR   Specific Gravity, Urine 1.015  1.005 - 1.030   pH 8.0  5.0 - 8.0   Glucose, UA NEGATIVE  NEGATIVE mg/dL   Hgb urine dipstick NEGATIVE  NEGATIVE   Bilirubin Urine NEGATIVE  NEGATIVE   Ketones, ur 15 (*) NEGATIVE mg/dL   Protein, ur NEGATIVE  NEGATIVE mg/dL   Urobilinogen, UA 0.2  0.0 - 1.0 mg/dL   Nitrite NEGATIVE  NEGATIVE   Leukocytes, UA NEGATIVE  NEGATIVE   Comment: MICROSCOPIC NOT DONE ON URINES WITH NEGATIVE PROTEIN, BLOOD, LEUKOCYTES, NITRITE, OR GLUCOSE <1000 mg/dL.  MRSA PCR SCREENING     Status: Abnormal   Collection Time    08/18/13  5:30 PM      Result Value Range   MRSA by PCR POSITIVE (*) NEGATIVE   Comment:            The GeneXpert MRSA Assay (FDA     approved for NASAL specimens     only), is one component of a     comprehensive MRSA colonization     surveillance program. It is not     intended to diagnose MRSA     infection nor to guide or     monitor treatment for     MRSA infections.     RESULT CALLED TO, READ BACK BY AND VERIFIED WITH:     B. JOHNSON AT 1924 ON 08/18/13 BY S. VANHOORNE  BASIC METABOLIC PANEL     Status: Abnormal   Collection Time    08/19/13  5:10 AM      Result Value Range   Sodium 142  135 - 145 mEq/L   Potassium 3.9  3.5 - 5.1 mEq/L   Chloride 108  96 - 112 mEq/L   CO2 26  19 - 32 mEq/L   Glucose, Bld 100 (*) 70 - 99 mg/dL   BUN 11  6 - 23 mg/dL   Creatinine, Ser 3.08  0.50 - 1.10 mg/dL   Calcium 9.0  8.4 - 65.7 mg/dL   GFR calc non Af Amer >90  >90 mL/min   GFR calc Af Amer >90  >90 mL/min   Comment: (NOTE)     The eGFR  has been calculated using the CKD EPI equation.     This calculation has not been validated in all clinical situations.     eGFR's persistently <90 mL/min signify possible Chronic Kidney     Disease.  CBC     Status: None   Collection Time    08/19/13  5:10 AM      Result Value Range   WBC 9.5  4.0 - 10.5 K/uL   RBC 4.00  3.87 - 5.11 MIL/uL   Hemoglobin 12.2  12.0 - 15.0 g/dL   HCT 84.6  96.2 - 95.2 %  MCV 94.0  78.0 - 100.0 fL   MCH 30.5  26.0 - 34.0 pg   MCHC 32.4  30.0 - 36.0 g/dL   RDW 16.1  09.6 - 04.5 %   Platelets 298  150 - 400 K/uL    Dg Abd Acute W/chest  08/18/2013   CLINICAL DATA:  Abdominal pain. Irritable bowel syndrome.  EXAM: ACUTE ABDOMEN SERIES (ABDOMEN 2 VIEW & CHEST 1 VIEW)  COMPARISON:  PA and lateral chest 06/08/2013 and CT abdomen and pelvis 08/13/2013.  FINDINGS: Single view of the chest demonstrates a small focus upper atelectasis or scar in the left lung base. The lungs appear emphysematous but are otherwise clear. No pneumothorax or pleural fluid is identified.  Two views of the abdomen show no free intraperitoneal air. There appears to be a large volume of stool throughout the colon. No evidence of bowel obstruction is identified. The patient is status post hernia repair and lower lumbar fusion.  IMPRESSION: No acute finding chest or abdomen.  Large volume of stool throughout the colon.  Possible emphysema.   Electronically Signed   By: Drusilla Kanner M.D.   On: 08/18/2013 09:55    ROS Blood pressure 105/69, pulse 93, temperature 98.6 F (37 C), temperature source Oral, resp. rate 18, height 5\' 5"  (1.651 m), weight 137 lb (62.143 kg), SpO2 94.00%. Physical Exam Alert and oriented. Skin warm and dry. Oral mucosa is moist.   . Sclera anicteric, conjunctivae is pink. Thyroid not enlarged. No cervical lymphadenopathy. Lungs clear. Heart regular rate and rhythm.  Abdomen is soft. Bowel sounds are positive. No hepatomegaly. No abdominal masses felt. Tenderness mid  abdomen. (On very light palpatation she has pain).   No edema to lower extremities.    Assessment/Plan: Vomiting: Agree with IV fluids. No vomiting since yesterday. Clear liquids. Agree with IV Protonix for now.  Constipation; She has received one enema. May benefit a repeat enema.  Llana Aliment 08/19/2013, 4:35 PM   GI attending note; Patient interviewed and examined; abdominal pelvic CT from 08/13/2013 and one from 02/06/2012 reviewed along with abdominal films on admission as well as lab studies. Abdominal examination reveals full abdomen and lower midline scar. Bowel sounds are normal. On palpation abdomen is soft but with generalized tenderness which is more pronounced in periumbilicus region but no guarding noted. Rectal examination reveals no stool in the vault. Abdominopelvic CT from 6 days ago reveals multiple dilated loops of small bowel as well as proximal colon. It also shows sutures in the region of sigmoid colon indicative of prior surgery. Acute abdominal series on admission revealed few air fluid levels. Assessment; Acute illness with abdominal pain nausea and vomiting most likely secondary to gastroenteritis or ileus. Doubt that her symptoms are secondary to constipation. If this was the case would expect significant symptomatic improvement. This afternoon she is feeling better and getting ready to eat her evening meal. If symptoms relapse will need to do a Gastrografin study to rule out colonic stricture. Dr. Darrick Penna, was familiar with patient will be seeing her in a.m.Marland Kitchen

## 2013-08-19 NOTE — Progress Notes (Signed)
Pt's 2nd enema given with only small results.  Will continue to monitor.

## 2013-08-19 NOTE — Progress Notes (Signed)
[facility] PROGRESS NOTE  [name]:[id number] DOB: 1961-04-20 DOA: 08/18/2013 PCP: Elby Showers, MD  Assessment/Plan: 1. Abdominal pain. Felt to be due to ileus versus gastroenteritis. Appreciate GI assistance. She has received an enema with only minimal bowel movements. Clinically she does feel better. May need a Gastrografin study to rule out colonic stricture. Further workup per GI. 2. Dehydration. Improved with IV fluids. 3. Vomiting. Does not appear to have bowel obstruction based on clinical exam. Continue antiemetics and supportive treatment. 4. GERD. Continue proton pump inhibitors  Code Status: full code Family Communication: discussed with patient Disposition Plan: discharge home once improved   Consultants:  gastroenterology  Procedures:  none  Antibiotics:  none  HPI/Subjective: Feeling better, refused enema last night, no significant BM, vomiting is improving with phenergan  Objective: Filed Vitals:   08/19/13 1445  BP: 105/69  Pulse: 93  Temp: 98.6 F (37 C)  Resp: 18    Intake/Output Summary (Last 24 hours) at 08/19/13 1815 Last data filed at 08/19/13 1501  Gross per 24 hour  Intake 241.25 ml  Output      0 ml  Net 241.25 ml   Filed Weights   08/18/13 1642  Weight: 62.143 kg (137 lb)    Exam:   General:  NAD  Cardiovascular: S1, S2 RRR  Respiratory: CTA B  Abdomen: soft, diffusely tender, bs+  Musculoskeletal: no edema b/l   Data Reviewed: Basic Metabolic Panel:  Recent Labs Lab 08/13/13 1133 08/18/13 0858 08/19/13 0510  NA 144 139 142  K 3.5 3.9 3.9  CL 104 101 108  CO2 30 29 26   GLUCOSE 182* 146* 100*  BUN 13 10 11   CREATININE 0.71 0.65 0.55  CALCIUM 10.7* 10.3 9.0   Liver Function Tests:  Recent Labs Lab 08/13/13 1133 08/18/13 0858  AST 29 25  ALT 20 26  ALKPHOS 74 81  BILITOT 0.3 0.5  PROT 7.4 7.2  ALBUMIN 4.4 4.1    Recent Labs Lab 08/13/13 1133 08/18/13 0858  LIPASE 19 13    No results found for this basename: AMMONIA,  in the last 168 hours CBC:  Recent Labs Lab 08/13/13 1133 08/18/13 0858 08/19/13 0510  WBC 14.5* 10.0 9.5  NEUTROABS 12.9* 8.3*  --   HGB 14.0 13.5 12.2  HCT 43.5 41.2 37.6  MCV 94.0 92.6 94.0  PLT 322 335 298   Cardiac Enzymes: No results found for this basename: CKTOTAL, CKMB, CKMBINDEX, TROPONINI,  in the last 168 hours BNP (last 3 results) No results found for this basename: PROBNP,  in the last 8760 hours CBG: No results found for this basename: GLUCAP,  in the last 168 hours  Recent Results (from the past 240 hour(s))  URINE CULTURE     Status: None   Collection Time    08/13/13 11:35 AM      Result Value Range Status   Specimen Description URINE, CLEAN CATCH   Final   Special Requests NONE   Final   Culture  Setup Time     Final   Value: 08/13/2013 17:07     Performed at [facility] Count     Final   Value: >=100,000 COLONIES/ML     Performed at [facility]   Culture     Final   Value: KLEBSIELLA PNEUMONIAE     Performed at [facility]   Report Status 08/15/2013 FINAL   Final   Organism ID, Bacteria KLEBSIELLA PNEUMONIAE  Final  MRSA PCR SCREENING     Status: Abnormal   Collection Time    08/18/13  5:30 PM      Result Value Range Status   MRSA by PCR POSITIVE (*) NEGATIVE Final   Comment:            The GeneXpert MRSA Assay (FDA     approved for NASAL specimens     only), is one component of a     comprehensive MRSA colonization     surveillance program. It is not     intended to diagnose MRSA     infection nor to guide or     monitor treatment for     MRSA infections.     RESULT CALLED TO, READ BACK BY AND VERIFIED WITH:     B. JOHNSON AT 1924 ON 08/18/13 BY Wynonia Lawman     Studies: Dg Abd Acute W/chest  08/18/2013   CLINICAL DATA:  Abdominal pain. Irritable bowel syndrome.  EXAM: ACUTE ABDOMEN SERIES (ABDOMEN 2 VIEW & CHEST 1 VIEW)  COMPARISON:  PA and lateral  chest 06/08/2013 and CT abdomen and pelvis 08/13/2013.  FINDINGS: Single view of the chest demonstrates a small focus upper atelectasis or scar in the left lung base. The lungs appear emphysematous but are otherwise clear. No pneumothorax or pleural fluid is identified.  Two views of the abdomen show no free intraperitoneal air. There appears to be a large volume of stool throughout the colon. No evidence of bowel obstruction is identified. The patient is status post hernia repair and lower lumbar fusion.  IMPRESSION: No acute finding chest or abdomen.  Large volume of stool throughout the colon.  Possible emphysema.   Electronically Signed   By: Drusilla Kanner M.D.   On: 08/18/2013 09:55    Scheduled Meds: . ALPRAZolam  1 mg Oral TID  . amphetamine-dextroamphetamine  15 mg Oral Daily  . Chlorhexidine Gluconate Cloth  6 each Topical Q0600  . dicyclomine  10 mg Oral TID  . DULoxetine  120 mg Oral Daily  . enoxaparin (LOVENOX) injection  40 mg Subcutaneous Q24H  . feeding supplement (RESOURCE BREEZE)  1 Container Oral TID BM  . gabapentin  300 mg Oral Daily  . gabapentin  900 mg Oral QHS  . mirtazapine  15 mg Oral QHS  . montelukast  10 mg Oral Daily  . mupirocin ointment  1 application Nasal BID  . nicotine  21 mg Transdermal Daily  . pantoprazole  80 mg Oral Daily  . polyethylene glycol  17 g Oral Daily  . topiramate  25 mg Oral TID  . [DISCONTINUED] sodium chloride   Intravenous STAT   Continuous Infusions: . sodium chloride 75 mL/hr at 08/19/13 1500    Active Problems:   GERD   VOMITING   CONSTIPATION, HX OF   Abdominal pain    Time spent:    [facility]  [facility] Pager [phone]. If 7PM-7AM, please contact night-coverage at [email or web address], password [facility] 08/19/2013, 6:15 PM  LOS: 1 day

## 2013-08-19 NOTE — Progress Notes (Signed)
Utilization review completed.  

## 2013-08-19 NOTE — Progress Notes (Addendum)
INITIAL NUTRITION ASSESSMENT  DOCUMENTATION CODES Per approved criteria  -Not Applicable   INTERVENTION: Resource Breeze po TID, each supplement provides 250 kcal and 9 grams of protein. Follow for diet advancement- add Ensure BID with diet advancement, per pt request.  Diet education on IBS completed, per pt and Case Manager's request.   NUTRITION DIAGNOSIS: Inadequate oral intake related to altered Gi function as evidenced by nausea, PO: 0%.   Goal: Pt will meet >90% of estimated energy needs  Monitor:  Diet advancement and tolerance, PO intake, labs, weight changes, skin assessment, changes in status  Reason for Assessment: MST=5  52 y.o. female  Admitting Dx: <principal problem not specified>  ASSESSMENT: Pt admitted for abdominal pain. She reports hx of multiple GI problems, including bowel prolapse and resection at [facility] 13 years ago. She reports "nausea and pain are my worst enemies" and they continue to "come and go". She reports progressive wt loss over the past year and longer. Wt hx reveals a 11.6% wt loss x 1 year, 4.6% wt loss x 6 months, and 2.1% wt loss x 3 months, which are not clinically significant.  She reports poor appetite PTA and reports she "doesn't have an appetite" while in the hospital. PO: 0%. She reports she "eats like a squirrel" at home and tries to stock up on "safe foods", such as cheerios, white rice, white breads, and proteins. She reveals that she is lactose intolerant. She reveals food selection can be a challenge for her; "when you're on food stamps, you can't be choosy about your foods".  She has multiple nutrition questions related to her home diet. We discussed eating small, frequent meals to maximize intake, avoiding trigger foods, greasy foods, fried foods, and highly seasoned foods. Also discussed following a low fiber diet during flare ups and slowly advancing fluid intake and fiber sources in her diet. Discussed nutrition  supplements that she could purchase at home, such as Ensure Clear or lactose free Ensure. Provided AND Nutrition Care Manual's "IBS Nutrition Therapy" handout. Pt does not meet criteria for malnutrition at this time, but is at risk for malnutrition given wt loss, poor po intake, and multiple medical conditions.   Height: Ht Readings from Last 1 Encounters:  08/18/13 5\' 5"  (1.651 m)    Weight: Wt Readings from Last 1 Encounters:  08/18/13 137 lb (62.143 kg)    Ideal Body Weight: 125#  % Ideal Body Weight: 110%  Wt Readings from Last 10 Encounters:  08/18/13 137 lb (62.143 kg)  06/09/13 138 lb 7.2 oz (62.8 kg)  04/11/13 140 lb (63.504 kg)  01/14/13 143 lb (64.864 kg)  05/17/12 155 lb (70.308 kg)  05/15/12 155 lb 9.6 oz (70.58 kg)  04/07/12 157 lb (71.215 kg)  03/20/12 159 lb 6.4 oz (72.303 kg)  03/16/12 153 lb (69.4 kg)  02/06/12 155 lb (70.308 kg)    Usual Body Weight: 155#  % Usual Body Weight: 88%  BMI:  Body mass index is 22.8 kg/(m^2). Meets criteria for normal weight.   Estimated Nutritional Needs: Kcal: 1200-1300 daily Protein: 50-62 grams daily Fluid: 1.2-1.3 L daily  Skin: WDL  Diet Order: Clear Liquid  EDUCATION NEEDS: -Education needs addressed   Intake/Output Summary (Last 24 hours) at 08/19/13 1615 Last data filed at 08/19/13 1501  Gross per 24 hour  Intake 241.25 ml  Output      0 ml  Net 241.25 ml    Last BM: 08/19/13  Labs:   Recent [employer]  08/13/13 1133 08/18/13 0858 08/19/13 0510  NA 144 139 142  K 3.5 3.9 3.9  CL 104 101 108  CO2 30 29 26   BUN 13 10 11   CREATININE 0.71 0.65 0.55  CALCIUM 10.7* 10.3 9.0  GLUCOSE 182* 146* 100*    CBG (last 3)  No results found for this basename: GLUCAP,  in the last 72 hours  Scheduled Meds: . ALPRAZolam  1 mg Oral TID  . amphetamine-dextroamphetamine  15 mg Oral Daily  . Chlorhexidine Gluconate Cloth  6 each Topical Q0600  . dicyclomine  10 mg Oral TID  . DULoxetine  120 mg Oral  Daily  . enoxaparin (LOVENOX) injection  40 mg Subcutaneous Q24H  . feeding supplement (RESOURCE BREEZE)  1 Container Oral BID BM  . gabapentin  300 mg Oral Daily  . gabapentin  900 mg Oral QHS  . mirtazapine  15 mg Oral QHS  . montelukast  10 mg Oral Daily  . mupirocin ointment  1 application Nasal BID  . nicotine  21 mg Transdermal Daily  . pantoprazole  80 mg Oral Daily  . polyethylene glycol  17 g Oral Daily  . topiramate  25 mg Oral TID  . [DISCONTINUED] sodium chloride   Intravenous STAT    Continuous Infusions: . sodium chloride 75 mL/hr at 08/19/13 1500    Past Medical History  Diagnosis Date  . S/P colonoscopy 06/02/09    normal (Dr. Linna Darner)  . PUD (peptic ulcer disease) 01/2007    EGD Dr Jena Gauss, 2 antral ulcers, negative h pylori  . MRSA infection     Dr. Lenice Pressman, currently under treatment  . Hiatal hernia 2008    on EGD above  . Asthma   . PTSD (post-traumatic stress disorder)   . Migraines   . Degenerative disc disease   . Rectal prolapse   . SBO (small bowel obstruction)   . ADHD (attention deficit hyperactivity disorder)   . S/P endoscopy 03/26/11    gastritis-Dr Darrick Penna  . Clostridium difficile colitis 04/10/11 &11/2011    tx w/ flagyl  . Chronic abdominal pain 2003    EX LAP APR 2004 RUPTURE L OV CYST, JUL 2004 ADHESIONS  . Irritable bowel syndrome 2004 CONSTIPATION  . BMI (body mass index) 20.0-29.9 2009 127 LBS  . COPD (chronic obstructive pulmonary disease)   . Chronic nausea   . Anginal pain     Past Surgical History  Procedure Laterality Date  . Bowel resection      x2, secondary to adhesions  . Appendectomy    . Laparoscopic lysis intestinal adhesions    . Back surgery    . Abdominal hysterectomy    . Tubal ligation    . Umbilical hernia repair  2008    x5  . Neck surgery  2009  . Partial hysterectomy    . Upper gastrointestinal endoscopy  APR 2008 [facility] BLEEDING/PAIN    PUD  . Upper gastrointestinal endoscopy  JUL 2012 [facility] PAIN     MILD GASTRITIS  . Esophagogastroduodenoscopy  03/23/2011     Normal esophagus without evidence of Barrett's, mass, erosions, or ulcerations./ Patchy erythema with occasional erosion in the antrum.  Biopsies  obtained via cold forceps to evaluate for H. pylori gastritis/ Small hiatal hernia./Normal duodenal bulb and second portion of the duodenum.  . Cervical spine surgery    . Flexible sigmoidoscopy  07/14/2002    Normal limited flexible sigmoidoscopy with stool in the rectum and rectosigmoid precluding a full colonoscopy  .  Colonoscopy  03/21/2012     ZOX:WRUEAVW polyp in the transverse colon/Sessile polyp in the rectum/Internal hemorrhoids/  . Esophagogastroduodenoscopy  03/21/2012    UJW:JXBJY Hiatal hernia/ABDOMINALPAIN/DIARRHEA MOST LIKELY DUE TO IBS, GASTRITIS DUODENITIS    Abdulloh Ullom A. Mayford Knife, RD, LDN Pager: [phone]

## 2013-08-19 NOTE — Care Management Note (Signed)
    Page 1 of 1   08/19/2013     11:58:15 AM   CARE MANAGEMENT NOTE 08/19/2013  Patient:  [name], [name]   Account Number:  [id number]  Date Initiated:  08/19/2013  Documentation initiated by:  Sharrie Rothman  Subjective/Objective Assessment:   Pt admitted from home with with abd pain and n/v. Pt lives alone and will return home at discharge. Pt uses a cane for home use. Pt stated her PCP is arranging a CAP aide for home use. Pt is fairly independent with ADL's.     Action/Plan:   No CM needs noted.   Anticipated DC Date:  08/20/2013   Anticipated DC Plan:  HOME/SELF CARE      DC Planning Services  CM consult      Choice offered to / List presented to:             Status of service:  Completed, signed off Medicare Important Message given?   (If response is "NO", the following Medicare IM given date fields will be blank) Date Medicare IM given:   Date Additional Medicare IM given:    Discharge Disposition:  HOME/SELF CARE  Per UR Regulation:    If discussed at Long Length of Stay Meetings, dates discussed:    Comments:  08/19/13 1155 Arlyss Queen, RN BSN CM

## 2013-08-20 ENCOUNTER — Inpatient Hospital Stay (HOSPITAL_COMMUNITY): Payer: PRIVATE HEALTH INSURANCE

## 2013-08-20 DIAGNOSIS — R109 Unspecified abdominal pain: Secondary | ICD-10-CM

## 2013-08-20 DIAGNOSIS — R197 Diarrhea, unspecified: Secondary | ICD-10-CM

## 2013-08-20 DIAGNOSIS — R112 Nausea with vomiting, unspecified: Secondary | ICD-10-CM

## 2013-08-20 LAB — BASIC METABOLIC PANEL
BUN: 12 mg/dL (ref 6–23)
CO2: 25 mEq/L (ref 19–32)
Calcium: 8.7 mg/dL (ref 8.4–10.5)
Chloride: 110 mEq/L (ref 96–112)
GFR calc non Af Amer: >90 mL/min (ref >90)
Glucose, Bld: 100 mg/dL — ABNORMAL HIGH (ref 70–99)
Potassium: 3.4 mEq/L — ABNORMAL LOW (ref 3.5–5.1)

## 2013-08-20 LAB — CBC
HCT: 34.3 % — ABNORMAL LOW (ref 36.0–46.0)
Hemoglobin: 10.9 g/dL — ABNORMAL LOW (ref 12.0–15.0)
MCH: 30 pg (ref 26.0–34.0)
MCHC: 31.8 g/dL (ref 30.0–36.0)
MCV: 94.5 fL (ref 78.0–100.0)
RBC: 3.63 MIL/uL — ABNORMAL LOW (ref 3.87–5.11)
WBC: 6.2 10*3/uL (ref 4.0–10.5)

## 2013-08-20 MED ORDER — POLYETHYLENE GLYCOL 3350 17 G PO PACK
17.0000 g | PACK | ORAL | Status: AC
  Administered 2013-08-20 (×6): 17 g via ORAL
  Filled 2013-08-20 (×4): qty 1

## 2013-08-20 MED ORDER — HYDROMORPHONE HCL PF 1 MG/ML IJ SOLN
0.5000 mg | INTRAMUSCULAR | Status: DC | PRN
  Administered 2013-08-20 – 2013-08-21 (×3): 0.5 mg via INTRAVENOUS
  Filled 2013-08-20 (×4): qty 1

## 2013-08-20 NOTE — Progress Notes (Signed)
UR chart review completed.  

## 2013-08-20 NOTE — Progress Notes (Signed)
Subjective:  Patient complains of diarrhea that started about the first of November. She had this for several days and developed abdominal pain, associated with N/V. Went to ER on 08/13/13 after several hours of profuse vomiting of undigested foods. Vomiting woke up up at night. Diagnosed with UTI. Also started on Flagyl per Dr. Darrick Penna given h/o C.Diff. Patient states she did ok for couple of days but the pain and vomiting returned. With the initial ER visit on 08/13/13 she had CT A/P which showed fluid filled mildly distended small and large bowel suggesting enteritis and mild ileus. Contrast was not in the distal small bowel. Abd film on 08/18/13 showed large volume of stool throughout the colon. Patient reports diarrhea multiple times daily until admission. "drank a bottle of Pepto-Bismol".   At this time she complains of ongoing abdominal pain, central to lower abdomen. Hurts to touch. Pain not controlled. Takes three vicodin daily for chronic back/neck pain. Only improvement right now is no diarrhea and vomiting although states she is medicated regularly for nausea.   Objective: Vital signs in last 24 hours: Temp:  [97.8 F (36.6 C)-98.6 F (37 C)] 97.8 F (36.6 C) (11/13 0701) Pulse Rate:  [71-93] 71 (11/13 0701) Resp:  [18-20] 20 (11/13 0701) BP: (92-111)/(58-70) 111/70 mmHg (11/13 0701) SpO2:  [94 %-96 %] 96 % (11/13 0701) Last BM Date: 08/19/13 General:   Alert,  Well-developed, well-nourished, pleasant and cooperative in NAD Head:  Normocephalic and atraumatic. Eyes:  Sclera clear, no icterus.   Abdomen:  Soft,   nondistended. No masses, hepatosplenomegaly or hernias noted. Normal bowel sounds, without guarding, and without rebound. Diffuse lower abdominal tenderness and tenderness in midline to epigastrium.    Extremities:  Without clubbing, deformity or edema. Neurologic:  Alert and  oriented x4;  grossly normal neurologically. Skin:  Intact without significant lesions or  rashes. Psych:  Alert and cooperative. Normal mood and affect.  Intake/Output from previous day: 11/12 0701 - 11/13 0700 In: 1868.8 [P.O.:720; I.V.:1148.8] Out: -  Intake/Output this shift:    Lab Results: CBC  Recent Labs  08/18/13 0858 08/19/13 0510 08/20/13 0458  WBC 10.0 9.5 6.2  HGB 13.5 12.2 10.9*  HCT 41.2 37.6 34.3*  MCV 92.6 94.0 94.5  PLT 335 298 263   BMET  Recent Labs  08/18/13 0858 08/19/13 0510 08/20/13 0458  NA 139 142 142  K 3.9 3.9 3.4*  CL 101 108 110  CO2 29 26 25   GLUCOSE 146* 100* 100*  BUN 10 11 12   CREATININE 0.65 0.55 0.57  CALCIUM 10.3 9.0 8.7   LFTs  Recent Labs  08/18/13 0858  BILITOT 0.5  ALKPHOS 81  AST 25  ALT 26  PROT 7.2  ALBUMIN 4.1    Recent Labs  08/18/13 0858  LIPASE 13   PT/INR No results found for this basename: LABPROT, INR,  in the last 72 hours    Imaging Studies:   Ct Abdomen Pelvis W Contrast  08/13/2013   CLINICAL DATA:  Severe abdominal pain and emesis, periumbilical pain radiating to left lower quadrant for 3 days. History of bowel obstruction and abdominal surgery.  EXAM: CT ABDOMEN AND PELVIS WITH CONTRAST  TECHNIQUE: Multidetector CT imaging of the abdomen and pelvis was performed using the standard protocol following bolus administration of intravenous contrast.  CONTRAST:  50mL OMNIPAQUE IOHEXOL 300 MG/ML SOLN, OMNIPAQUE IOHEXOL 300 MG/ML SOLN  COMPARISON:  Abdominal radiograph April 11, 2013. CT of the abdomen and pelvis Feb 05, 2013  FINDINGS: Limited view of the lung bases demonstrate minimal dependent atelectasis and are otherwise unremarkable. Included heart and pericardium are unremarkable.  Small hiatal hernia. The stomach, small and large demonstrate mild fluid distension, with air-fluid levels and multiple transition points. No pneumatosis. Sigmoid anastomosis again noted, contrast has yet to reach the distal small bowel. Fecalization of small bowel contents, with a solitary loop of  distended small bowel in the pelvis. Air and stool seen in the large bowel. Trace ascites without drainable fluid collection.  The liver, spleen, pancreas, gallbladder and adrenal gland are unremarkable.  Kidneys are well located, normal in appearance. Great vessels are normal course and caliber with mild intimal thickening. Internal reproductive organs appear surgically absent. Urinary bladder is deep is unremarkable. Phleboliths in the pelvis.  Coil material along the anterior abdominal wall consistent with prior herniorrhaphy. Small right fat containing inguinal hernia. L4-5 and L5-S1 PLIF.  IMPRESSION: Fluid filled mildly distended small and large bowel, suggesting enteritis and mild ileus, this is unlikely to reflect a small bowel obstruction. No bowel perforation. Trace ascites.   Electronically Signed   By: Awilda Metro   On: 08/13/2013 13:23   Dg Abd Acute W/chest  08/18/2013   CLINICAL DATA:  Abdominal pain. Irritable bowel syndrome.  EXAM: ACUTE ABDOMEN SERIES (ABDOMEN 2 VIEW & CHEST 1 VIEW)  COMPARISON:  PA and lateral chest 06/08/2013 and CT abdomen and pelvis 08/13/2013.  FINDINGS: Single view of the chest demonstrates a small focus upper atelectasis or scar in the left lung base. The lungs appear emphysematous but are otherwise clear. No pneumothorax or pleural fluid is identified.  Two views of the abdomen show no free intraperitoneal air. There appears to be a large volume of stool throughout the colon. No evidence of bowel obstruction is identified. The patient is status post hernia repair and lower lumbar fusion.  IMPRESSION: No acute finding chest or abdomen.  Large volume of stool throughout the colon.  Possible emphysema.   Electronically Signed   By: Drusilla Kanner M.D.   On: 08/18/2013 09:55  [2 weeks]   Assessment: Abdominal pain associated with N/V/D for 10-14 days.  ?gastroenteritis vs ileus vs partial obstruction. Doubt primary problem constipation. Doubt biliary.  Clinically abdomen is soft, nondistended.   Plan: 1. Patient states her pain is not controlled with Vicodin. She takes 3-4 times daily at home chronically. Low dose Dilaudid ordered. Discussed need to use minimal amount given bowel issues.  2. ?consider empirical treatment for C.Diff given her history of previous C.Diff infection and recent diarrhea. To discuss further with Dr. Jena Gauss regarding next step in management. At this time does not appear to have complete bowel obstruction but wonder regarding further imaging at this time due to ongoing abdominal pain.   LOS: 2 days   Tana Coast  08/20/2013, 8:00 AM   Addendum: D/C Bentyl for now. Discussed with Dr. Jena Gauss. Plain abdominal film. If no evidence of obstruction, then proceed with gastrografin enemia.   Attending note:  Patient seen, interviewed and examined. Recent imaging studies reviewed. I doubt this lady has a mechanical bowel obstruction this time although she, no doubt, has significant adhesions. Likewise, I'm not really impressed that she has had a significant diarrheal illness recently. I found her to be in no acute distress at all this evening, talking on the telephone, when I came to see her. She does not have a surgical abdomen. Let's go ahead and decompress her right colon with a MiraLax purge  this evening and then see where we stand  tomorrow morning. I believe we can hold off on contrast enema for the time being.Marland Kitchen

## 2013-08-20 NOTE — Progress Notes (Signed)
[facility] PROGRESS NOTE  [name] GNF:[id number] DOB: Apr 01, 1961 DOA: 08/18/2013 PCP: Elby Showers, MD  Assessment/Plan: 1. Abdominal pain, nausea, vomiting and diarrhea: Ongoing for approximately 2 weeks. Vomiting has resolved, pain and nausea are controlled but persists. Consider gastroenteritis versus ileus versus partial obstruction. GI plans abdominal film and then Gastrografin enema. 2. Cigarette Smoker: Recommend cessation 3. PTSD 4. Chronic abdominal pain, irritable bowel 5. COPD: Appears as a. 6. History of small bowel obstruction with resection x2 7. Constipation   Repeat abdominal imaging, Gastrografin study per GI   Pending studies:   none  Code Status: full code DVT prophylaxis: Lovenox Family Communication: discussed with friend Disposition Plan: home when improved  Brendia Sacks, MD  [facility]  Pager [phone] If 7PM-7AM, please contact night-coverage at [email or web address], password [facility] 08/20/2013, 11:15 AM  LOS: 2 days   Summary: 52 year old woman presented with vomiting and abdominal pain for approximately one week with progressive worsening. At time of admission CT of the abdomen and pelvis revealed mild ileus without evidence of obstruction. Admitted for abdominal pain and vomiting thought secondary to gastroenteritis or ileus.  Consultants:  Gastroenterology  Procedures:    Antibiotics:    HPI/Subjective: Vomiting has resolved but still has nausea which is well-controlled with antiemetics. Persistent abdominal pain is unchanged. Little flatus.  Objective: Filed Vitals:   08/19/13 0510 08/19/13 1445 08/19/13 2059 08/20/13 0701  BP: 89/65 105/69 92/58 111/70  Pulse: 58 93 76 71  Temp: 98.2 F (36.8 C) 98.6 F (37 C) 98.1 F (36.7 C) 97.8 F (36.6 C)  TempSrc: Oral Oral Oral Oral  Resp: 18 18 18 20   Height:      Weight:      SpO2: 95% 94% 95% 96%    Intake/Output Summary (Last 24 hours) at 08/20/13  1115 Last data filed at 08/20/13 0837  Gross per 24 hour  Intake 1988.75 ml  Output      0 ml  Net 1988.75 ml     Filed Weights   08/18/13 1642  Weight: 62.143 kg (137 lb)    Exam:   Afebrile, vital signs stable.  General: Appears calm and comfortable. Nontoxic.  Cardiovascular: Regular rate and rhythm. No murmur, rub or gallop. No lower extremity edema.  Respiratory: Clear to auscultation bilaterally. No wheezes, rales or rhonchi. Normal respiratory effort.  Abdomen: Positive bowel sounds. Mild to moderate generalized tenderness.  Data Reviewed:  Basic metabolic panel unremarkable. Potassium 3.4.  Hemoglobin stable 10.9.  Acute abdominal series 11/11 was unremarkable  Scheduled Meds: . ALPRAZolam  1 mg Oral TID  . amphetamine-dextroamphetamine  15 mg Oral Daily  . Chlorhexidine Gluconate Cloth  6 each Topical Q0600  . DULoxetine  120 mg Oral Daily  . enoxaparin (LOVENOX) injection  40 mg Subcutaneous Q24H  . feeding supplement (RESOURCE BREEZE)  1 Container Oral TID BM  . gabapentin  300 mg Oral Daily  . gabapentin  900 mg Oral QHS  . mirtazapine  15 mg Oral QHS  . montelukast  10 mg Oral Daily  . mupirocin ointment  1 application Nasal BID  . nicotine  21 mg Transdermal Daily  . pantoprazole  80 mg Oral Daily  . polyethylene glycol  17 g Oral Daily  . topiramate  25 mg Oral TID   Continuous Infusions: . sodium chloride 75 mL/hr at 08/20/13 0607    Active Problems:   GERD   VOMITING   CONSTIPATION, HX OF   Abdominal pain   Time spent 25  minutes

## 2013-08-20 NOTE — Progress Notes (Signed)
Spoke with radiologist, Dr. Kearney Hard. Patient has no signs of obstruction on today's abd film. She has moderate stool load and gas in right colon. They no longer have gastrografin. Would not advice enema with current water soluble contrast that is available if using for therapeutic effect as there is no osmolar effect.   Postponed for now. Can reorder if high suspicion of colon obstruction but not clear that it would be beneficial at this time. Small lesions would not be seen on unprepped study.

## 2013-08-21 ENCOUNTER — Encounter (HOSPITAL_COMMUNITY): Payer: Self-pay | Admitting: Gastroenterology

## 2013-08-21 DIAGNOSIS — K589 Irritable bowel syndrome without diarrhea: Secondary | ICD-10-CM

## 2013-08-21 LAB — BASIC METABOLIC PANEL
BUN: 8 mg/dL (ref 6–23)
Creatinine, Ser: 0.56 mg/dL (ref 0.50–1.10)
GFR calc Af Amer: >90 mL/min (ref >90)
GFR calc non Af Amer: >90 mL/min (ref >90)
Glucose, Bld: 94 mg/dL (ref 70–99)
Potassium: 3.7 mEq/L (ref 3.5–5.1)

## 2013-08-21 MED ORDER — BISACODYL 5 MG PO TBEC
10.0000 mg | DELAYED_RELEASE_TABLET | Freq: Once | ORAL | Status: AC
  Administered 2013-08-21: 10 mg via ORAL
  Filled 2013-08-21: qty 1
  Filled 2013-08-21: qty 2

## 2013-08-21 MED ORDER — HYDROMORPHONE HCL PF 1 MG/ML IJ SOLN
1.0000 mg | Freq: Once | INTRAMUSCULAR | Status: AC
  Administered 2013-08-21: 1 mg via INTRAVENOUS
  Filled 2013-08-21: qty 1

## 2013-08-21 MED ORDER — POLYETHYLENE GLYCOL 3350 17 G PO PACK: 17.0000 g | PACK | Freq: Three times a day (TID) | ORAL | Status: DC

## 2013-08-21 MED ORDER — DOCUSATE SODIUM 100 MG PO CAPS
100.0000 mg | ORAL_CAPSULE | Freq: Two times a day (BID) | ORAL | Status: DC
  Administered 2013-08-21 – 2013-08-22 (×3): 100 mg via ORAL
  Filled 2013-08-21 (×3): qty 1

## 2013-08-21 MED ORDER — POLYETHYLENE GLYCOL 3350 17 G PO PACK
17.0000 g | PACK | ORAL | Status: AC
  Administered 2013-08-21 (×6): 17 g via ORAL
  Filled 2013-08-21 (×4): qty 1

## 2013-08-21 MED ORDER — SENNA 8.6 MG PO TABS
1.0000 | ORAL_TABLET | Freq: Every day | ORAL | Status: DC
  Administered 2013-08-21 – 2013-08-22 (×2): 8.6 mg via ORAL
  Filled 2013-08-21 (×2): qty 1

## 2013-08-21 NOTE — Progress Notes (Signed)
Patient still has had no BM.  Repeat Miralax X 6 today. She also has order for senna. Received dulcolax 10mg  today.

## 2013-08-21 NOTE — Progress Notes (Signed)
Subjective:  Patient took 7 doses of miralax yesterday and no BM. Dr. Irene Limbo backed off on her diet to clear liquids. Patient states nausea is better with phenergan, took 25mg  yesterday. Took 1mg  dilaudid and 20mg  hydrocodone yesterday. Denies improvement in pain. Starts inquiring about h/o "spot" on my thyroid, "is it my gallbladder", "why have I lost 30 pounds in the last year and my appetite has been bad over the past two months". Has seen her new PCP once. We last saw her in 05/2012.   Objective: Vital signs in last 24 hours: Temp:  [97.5 F (36.4 C)-98.6 F (37 C)] 98.6 F (37 C) (11/14 0510) Pulse Rate:  [62-72] 67 (11/14 0510) Resp:  [17-20] 18 (11/14 0510) BP: (96-120)/(48-75) 107/68 mmHg (11/14 0510) SpO2:  [96 %-97 %] 97 % (11/14 0510) Last BM Date: 08/19/13 General:   Alert,  Well-developed, well-nourished, pleasant and cooperative in NAD Head:  Normocephalic and atraumatic. Eyes:  Sclera clear, no icterus.   Abdomen:  Soft, mild periumbilical abdominal pain and nondistended. No masses, hepatosplenomegaly or hernias noted. Normal bowel sounds, without guarding, and without rebound.   Extremities:  Without clubbing, deformity or edema. Neurologic:  Alert and  oriented x4;  grossly normal neurologically. Skin:  Intact without significant lesions or rashes. Psych:  Alert and cooperative. Normal mood and affect.  Intake/Output from previous day: 11/13 0701 - 11/14 0700 In: 2487.5 [P.O.:720; I.V.:1767.5] Out: -  Intake/Output this shift:    Lab Results: CBC  Recent Labs  08/18/13 0858 08/19/13 0510 08/20/13 0458  WBC 10.0 9.5 6.2  HGB 13.5 12.2 10.9*  HCT 41.2 37.6 34.3*  MCV 92.6 94.0 94.5  PLT 335 298 263   BMET  Recent Labs  08/19/13 0510 08/20/13 0458 08/21/13 0451  NA 142 142 143  K 3.9 3.4* 3.7  CL 108 110 112  CO2 26 25 25   GLUCOSE 100* 100* 94  BUN 11 12 8   CREATININE 0.55 0.57 0.56  CALCIUM 9.0 8.7 8.7   LFTs  Recent Labs  08/18/13 0858   BILITOT 0.5  ALKPHOS 81  AST 25  ALT 26  PROT 7.2  ALBUMIN 4.1    Recent Labs  08/18/13 0858  LIPASE 13   PT/INR No results found for this basename: LABPROT, INR,  in the last 72 hours    Imaging Studies:   Ct Abdomen Pelvis W Contrast  08/13/2013   CLINICAL DATA:  Severe abdominal pain and emesis, periumbilical pain radiating to left lower quadrant for 3 days. History of bowel obstruction and abdominal surgery.  EXAM: CT ABDOMEN AND PELVIS WITH CONTRAST  TECHNIQUE: Multidetector CT imaging of the abdomen and pelvis was performed using the standard protocol following bolus administration of intravenous contrast.  CONTRAST:  50mL OMNIPAQUE IOHEXOL 300 MG/ML SOLN, OMNIPAQUE IOHEXOL 300 MG/ML SOLN  COMPARISON:  Abdominal radiograph April 11, 2013. CT of the abdomen and pelvis Feb 05, 2013  FINDINGS: Limited view of the lung bases demonstrate minimal dependent atelectasis and are otherwise unremarkable. Included heart and pericardium are unremarkable.  Small hiatal hernia. The stomach, small and large demonstrate mild fluid distension, with air-fluid levels and multiple transition points. No pneumatosis. Sigmoid anastomosis again noted, contrast has yet to reach the distal small bowel. Fecalization of small bowel contents, with a solitary loop of distended small bowel in the pelvis. Air and stool seen in the large bowel. Trace ascites without drainable fluid collection.  The liver, spleen, pancreas, gallbladder and adrenal gland are unremarkable.  Kidneys are well located, normal in appearance. Great vessels are normal course and caliber with mild intimal thickening. Internal reproductive organs appear surgically absent. Urinary bladder is deep is unremarkable. Phleboliths in the pelvis.  Coil material along the anterior abdominal wall consistent with prior herniorrhaphy. Small right fat containing inguinal hernia. L4-5 and L5-S1 PLIF.  IMPRESSION: Fluid filled mildly distended small and large  bowel, suggesting enteritis and mild ileus, this is unlikely to reflect a small bowel obstruction. No bowel perforation. Trace ascites.   Electronically Signed   By: Awilda Metro   On: 08/13/2013 13:23   Dg Abd 2 Views  08/20/2013   CLINICAL DATA:  Abdominal pain.  EXAM: ABDOMEN - 2 VIEW  COMPARISON:  08/18/2013  FINDINGS: There is a nonobstructive bowel gas pattern. Moderate stool and gas in the right colon. No free air organomegaly. Postsurgical changes in the lumbar spine from posterior fusion.  IMPRESSION: No evidence of bowel obstruction or free air. Moderate gas and stool in the right colon, slightly improved since prior study.   Electronically Signed   By: Charlett Nose M.D.   On: 08/20/2013 14:28    Assessment:  Abdominal pain associated with N/V/D. Initial CT ?gastroenteritis vs ileus. Abd film yesterday, moderate stool in right colon. Still has not had BM after 7 doses of miralax. No urge per patient.   Discussed with her about numerous concerns as outlined above. GB looked ok on CT, abd u/s 2011, normal gallbladder EF last year. Unlikely cause of her symptoms. We will check TSH for weight loss concerns, etc. She can follow-up with PCP regarding h/o "spot" on her thyroid. Does not need to be addressed acutely. She had EGD/TCS 03/2012 as previously noted. We need to address acute concerns and then will be glad to address more chronic GI issues in the office.   Plan: 1. TSH. 2. Dulcolax 10mg  now.  3. Continue clear liquids until BMs. 4. Will continue to check with nursing staff on progress.   LOS: 3 days   Tana Coast  08/21/2013, 7:43 AM   Attending note:  Still no bowel movement early this afternoon. Patient appears to no distress, whatsoever. Agree with plan y as outlined above. I note that she did not undergo a complete colonoscopy last year because of a poor prep. This issue will need to be addressed when she follows up with Dr. Darrick Penna in the coming weeks.  Dr. Karilyn Cota will  see over the weekend as needed

## 2013-08-21 NOTE — Progress Notes (Signed)
[facility] PROGRESS NOTE  [name] ZOX:[id number] DOB: [birth year] DOA: 08/18/2013 PCP: Elby Showers, MD  Assessment/Plan: 1. Abdominal pain, nausea, vomiting and diarrhea: No vomiting. Still has some nausea which has been controlled by antiemetics. Tolerating liquids. Ongoing for approximately 2 weeks. Consider gastroenteritis versus ileus. No evidence of obstruction.  2. Cigarette Smoker: Recommend cessation 3. PTSD 4. Chronic abdominal pain, irritable bowel 5. COPD: Appears stable. 6. History of small bowel obstruction with resection x2: No evidence of recurrence. 7. Constipation: Continue management as treated by gastroenterology.   Encouraged patient to be out of bed and walking.  Continue aggressive bowel regimen.   Anticipate discharge when constipation resolved  Pending studies:   none  Code Status: full code DVT prophylaxis: Lovenox Family Communication: discussed with friend at bedside Disposition Plan: home  Brendia Sacks, MD  [facility]  Pager [phone] If 7PM-7AM, please contact night-coverage at [email or web address], password [facility] 08/21/2013, 12:43 PM  LOS: 3 days   Summary: 52 year old woman presented with vomiting and abdominal pain for approximately one week with progressive worsening. At time of admission CT of the abdomen and pelvis revealed mild ileus without evidence of obstruction. Admitted for abdominal pain and vomiting thought secondary to gastroenteritis or ileus.  Consultants:  Gastroenterology  Procedures:    Antibiotics:    HPI/Subjective: Feels about the same. Still is having some abdominal pain. No bowel movement. Some nausea but no vomiting. Tolerating liquids.She voices no other complaints.   Objective: Filed Vitals:   08/20/13 0701 08/20/13 1510 08/20/13 2241 08/21/13 0510  BP: 111/70 120/75 96/48 107/68  Pulse: 71 72 62 67  Temp: 97.8 F (36.6 C) 97.5 F (36.4 C) 98.2 F (36.8 C) 98.6 F (37 C)   TempSrc: Oral Oral Oral Oral  Resp: 20 20 17 18   Height:      Weight:      SpO2: 96% 97% 96% 97%    Intake/Output Summary (Last 24 hours) at 08/21/13 1243 Last data filed at 08/21/13 0915  Gross per 24 hour  Intake 2487.5 ml  Output      0 ml  Net 2487.5 ml     Filed Weights   08/18/13 1642  Weight: 62.143 kg (137 lb)    Exam:   Afebrile, vital signs stable.  General: Appears calm and comfortable.  Cardiovascular: Regular rate and rhythm. No murmur, rub gallop. No lower extremity edema.  Respiratory: Clear to auscultation bilaterally. No wheezes, rales or rhonchi. Normal respiratory effort.  Abdomen: Soft, nondistended. Positive bowel sounds. Moderate generalized tenderness.  Data Reviewed:  Basic metabolic panel unremarkable.  Abdominal x-ray without evidence of bowel obstruction or free air. Moderate gas and stool in the right colon with some improvement.  Scheduled Meds: . ALPRAZolam  1 mg Oral TID  . Chlorhexidine Gluconate Cloth  6 each Topical Q0600  . DULoxetine  120 mg Oral Daily  . enoxaparin (LOVENOX) injection  40 mg Subcutaneous Q24H  . feeding supplement (RESOURCE BREEZE)  1 Container Oral TID BM  . gabapentin  300 mg Oral Daily  . gabapentin  900 mg Oral QHS  . mirtazapine  15 mg Oral QHS  . montelukast  10 mg Oral Daily  . mupirocin ointment  1 application Nasal BID  . nicotine  21 mg Transdermal Daily  . pantoprazole  80 mg Oral Daily  . polyethylene glycol  17 g Oral Daily  . topiramate  25 mg Oral TID   Continuous Infusions: . sodium chloride 75 mL/hr at 08/21/13 1208  Active Problems:   GERD   VOMITING   CONSTIPATION, HX OF   Abdominal pain   Nausea and vomiting   Time spent 25 minutes

## 2013-08-22 MED ORDER — POLYETHYLENE GLYCOL 3350 17 G PO PACK: 17.0000 g | PACK | Freq: Every day | ORAL | Status: DC

## 2013-08-22 MED ORDER — DSS 100 MG PO CAPS: 100.0000 mg | ORAL_CAPSULE | Freq: Two times a day (BID) | ORAL | Status: DC

## 2013-08-22 NOTE — Discharge Summary (Signed)
Physician Discharge Summary  [name]:[id number] DOB: 12-Mar-1961 DOA: 08/18/2013  PCP: Elby Showers, MD  Admit date: 08/18/2013 Discharge date: 08/22/2013  Recommendations for Outpatient Follow-up:  1. Followup bowel regimen while on chronic narcotics 2. Continue to recommend smoking cessation 3. Followup chronic abdominal pain (include homehealth, outpatient follow-up instructions,   Follow-up Information   Follow up with [facility], MD In 1 week.   Specialty:  Internal Medicine   Contact information:   [address] [place] [address] [place] [address] [phone]      Discharge Diagnoses:  1. Abdominal pain, nausea, vomiting, diarrhea 2. Possible gastroenteritis versus ileus 3. Cigarette smoker 4. Chronic abdominal pain, irritable bowel syndrome 5. Constipation  Discharge Condition: Improved Disposition: Home  Diet recommendation: Regular  Filed Weights   08/18/13 1642  Weight: 62.143 kg (137 lb)    History of present illness:  52 year old woman presented with vomiting and abdominal pain for approximately one week with progressive worsening. At time of admission CT of the abdomen and pelvis revealed mild ileus without evidence of obstruction. Admitted for abdominal pain and vomiting thought secondary to gastroenteritis or ileus.  Hospital Course:  [name] was treated with supportive care, IV fluids and pain medications. She was seen in consultation with gastroenterology. Repeat abdominal imaging revealed constipation but no acute abnormalities. No evidence of obstruction. With supportive care and aggressive bowel regimen her abdominal pain resolved as did vomiting. She is tolerating a diet and is now stable for discharge. Individual issues as below.  1. Abdominal pain, nausea, vomiting and diarrhea: Resolved. Tolerating liquids. Consider gastroenteritis versus ileus. No evidence of obstruction.  2. Cigarette Smoker: Recommend  cessation. 3. PTSD 4. Chronic abdominal pain, irritable bowel: Stable. 5. COPD: Stable. 6. History of small bowel obstruction with resection x2: No evidence of recurrence. 7. Constipation: Resolving. Continued bowel regimen.  Consultants:  Gastroenterology Procedures: none Antibiotics: none  Discharge Instructions  Discharge Orders   Future Appointments Provider Department Dept Phone   08/24/2013 2:30 PM Nira Retort, NP [facility] [phone]   Future Orders Complete By Expires   Activity as tolerated - No restrictions  As directed    Diet general  As directed    Discharge instructions  As directed    Comments:     Continue stool softener and laxatives with goal bowel movement at least every other day. Call physician or seek immediate medical attention for  recurrent abdominal pain, nausea vomiting or worsening of condition. Stop smoking!       Medication List    STOP taking these medications       ciprofloxacin 500 MG tablet  Commonly known as:  CIPRO     loperamide 2 MG tablet  Commonly known as:  IMODIUM A-D     metroNIDAZOLE 500 MG tablet  Commonly known as:  FLAGYL     promethazine 25 MG tablet  Commonly known as:  PHENERGAN      TAKE these medications       Acidophilus Caps capsule  Take 2 capsules by mouth daily.     albuterol 90 MCG/ACT inhaler  Commonly known as:  PROVENTIL,VENTOLIN  Inhale 2 puffs into the lungs every 6 (six) hours as needed. For shortness of breath     ALPRAZolam 1 MG tablet  Commonly known as:  XANAX  Take 1-2 mg by mouth 3 (three) times daily. Patient takes 1 tablet twice a day and 2 tablets at bedtime     amphetamine-dextroamphetamine 15 MG  24 hr capsule  Commonly known as:  ADDERALL XR  Take 15 mg by mouth daily.     cyclobenzaprine 5 MG tablet  Commonly known as:  FLEXERIL  Take 5 mg by mouth every 8 (eight) hours as needed for muscle spasms.     DEXILANT 60 MG capsule  Generic drug:   dexlansoprazole  Take 60 mg by mouth every morning.     dicyclomine 10 MG capsule  Commonly known as:  BENTYL  Take 10 mg by mouth 3 (three) times daily.     DSS 100 MG Caps  Take 100 mg by mouth 2 (two) times daily.     DULoxetine 60 MG capsule  Commonly known as:  CYMBALTA  Take 120 mg by mouth every morning.     gabapentin 300 MG capsule  Commonly known as:  NEURONTIN  Take 300-900 mg by mouth 2 (two) times daily. 1 capsule in the morning and 3 capsules at bedtime     HYDROcodone-acetaminophen 5-325 MG per tablet  Commonly known as:  NORCO/VICODIN  Take 1-2 tablets by mouth every 6 (six) hours as needed for moderate pain.     lidocaine 5 %  Commonly known as:  LIDODERM  Place 1 patch onto the skin daily as needed. Uses for pain in back. Not used regularly.     mirtazapine 15 MG tablet  Commonly known as:  REMERON  Take 1 tablet by mouth at bedtime.     montelukast 10 MG tablet  Commonly known as:  SINGULAIR  Take 10 mg by mouth daily.     multivitamin with minerals Tabs tablet  Take 1 tablet by mouth daily.     nicotine 21 mg/24hr patch  Commonly known as:  NICODERM CQ - dosed in mg/24 hours  Place 1 patch onto the skin daily.     ondansetron 4 MG disintegrating tablet  Commonly known as:  ZOFRAN ODT  Take 1 tablet (4 mg total) by mouth every 8 (eight) hours as needed.     polyethylene glycol packet  Commonly known as:  MIRALAX  Take 17 g by mouth daily.     topiramate 25 MG tablet  Commonly known as:  TOPAMAX  Take 25 mg by mouth 3 (three) times daily.     traMADol 50 MG tablet  Commonly known as:  ULTRAM  Take 50 mg by mouth every 8 (eight) hours as needed. For pain     zolpidem 12.5 MG CR tablet  Commonly known as:  AMBIEN CR  Take 12.5 mg by mouth at bedtime as needed. For sleep       Allergies  Allergen Reactions  . Penicillins Anaphylaxis    REACTION: anaphylaxis  . Sulfa Antibiotics Itching  . Aspirin Other (See Comments)    Burning of  stomach. REACTION: GI upset  . Ibuprofen Nausea Only    Upset stomach due to acid reflux  . Lactaid [Lactase] Diarrhea and Other (See Comments)    Stomach cramping  . Morphine And Related Itching    The results of significant diagnostics from this hospitalization (including imaging, microbiology, ancillary and laboratory) are listed below for reference.    Significant Diagnostic Studies: Dg Abd 2 Views  08/20/2013   CLINICAL DATA:  Abdominal pain.  EXAM: ABDOMEN - 2 VIEW  COMPARISON:  08/18/2013  FINDINGS: There is a nonobstructive bowel gas pattern. Moderate stool and gas in the right colon. No free air organomegaly. Postsurgical changes in the lumbar spine from posterior fusion.  IMPRESSION: No evidence of bowel obstruction or free air. Moderate gas and stool in the right colon, slightly improved since prior study.   Electronically Signed   By: Charlett Nose M.D.   On: 08/20/2013 14:28   Dg Abd Acute W/chest  08/18/2013   CLINICAL DATA:  Abdominal pain. Irritable bowel syndrome.  EXAM: ACUTE ABDOMEN SERIES (ABDOMEN 2 VIEW & CHEST 1 VIEW)  COMPARISON:  PA and lateral chest 06/08/2013 and CT abdomen and pelvis 08/13/2013.  FINDINGS: Single view of the chest demonstrates a small focus upper atelectasis or scar in the left lung base. The lungs appear emphysematous but are otherwise clear. No pneumothorax or pleural fluid is identified.  Two views of the abdomen show no free intraperitoneal air. There appears to be a large volume of stool throughout the colon. No evidence of bowel obstruction is identified. The patient is status post hernia repair and lower lumbar fusion.  IMPRESSION: No acute finding chest or abdomen.  Large volume of stool throughout the colon.  Possible emphysema.   Electronically Signed   By: Drusilla Kanner M.D.   On: 08/18/2013 09:55    Labs: Basic Metabolic Panel:  Recent Labs Lab 08/18/13 0858 08/19/13 0510 08/20/13 0458 08/21/13 0451  NA 139 142 142 143  K 3.9 3.9  3.4* 3.7  CL 101 108 110 112  CO2 29 26 25 25   GLUCOSE 146* 100* 100* 94  BUN 10 11 12 8   CREATININE 0.65 0.55 0.57 0.56  CALCIUM 10.3 9.0 8.7 8.7   Liver Function Tests:  Recent Labs Lab 08/18/13 0858  AST 25  ALT 26  ALKPHOS 81  BILITOT 0.5  PROT 7.2  ALBUMIN 4.1    Recent Labs Lab 08/18/13 0858  LIPASE 13   CBC:  Recent Labs Lab 08/18/13 0858 08/19/13 0510 08/20/13 0458  WBC 10.0 9.5 6.2  NEUTROABS 8.3*  --   --   HGB 13.5 12.2 10.9*  HCT 41.2 37.6 34.3*  MCV 92.6 94.0 94.5  PLT 335 298 263    Active Problems:   GERD   VOMITING   CONSTIPATION, HX OF   Abdominal pain   Nausea and vomiting   Time coordinating discharge: 25 minutes  Signed:  Brendia Sacks, MD [facility] 08/22/2013, 12:28 PM

## 2013-08-22 NOTE — Progress Notes (Signed)
Patient received discharge instructions along with follow up appointments and prescriptions. Patient verbalized understanding of al instructions. Patient was escorted by staff via wheelchair to vehicle. Patient discharged to home in stable condition.

## 2013-08-22 NOTE — Progress Notes (Signed)
Patient has had several loose brown colored bowel movements in the night.  Patient has been incontinent of stool during the night.

## 2013-08-22 NOTE — Progress Notes (Signed)
[facility] PROGRESS NOTE  [name] ZOX:[id number] DOB: Feb 25, 1961 DOA: 08/18/2013 PCP: Elby Showers, MD  Assessment/Plan: 1. Abdominal pain, nausea, vomiting and diarrhea: Resolved. Tolerating liquids. Consider gastroenteritis versus ileus. No evidence of obstruction.  2. Cigarette Smoker: Recommend cessation. 3. PTSD 4. Chronic abdominal pain, irritable bowel: Stable. 5. COPD: Stable. 6. History of small bowel obstruction with resection x2: No evidence of recurrence. 7. Constipation: Resolving. Continued bowel regimen.   Home today. Continue bowel regimen as an outpatient.  Pending studies:   none  Code Status: full code DVT prophylaxis: Lovenox Family Communication: discussed with friend at bedside Disposition Plan: home  Brendia Sacks, MD  [facility]  Pager [phone] If 7PM-7AM, please contact night-coverage at [email or web address], password [facility] 08/22/2013, 10:59 AM  LOS: 4 days   Summary: 52 year old woman presented with vomiting and abdominal pain for approximately one week with progressive worsening. At time of admission CT of the abdomen and pelvis revealed mild ileus without evidence of obstruction. Admitted for abdominal pain and vomiting thought secondary to gastroenteritis or ileus.  Consultants:  Gastroenterology  Procedures:    Antibiotics:    HPI/Subjective: Feels better. Multiple stools overnight. No vomiting. Tolerating diet.  Objective: Filed Vitals:   08/21/13 1500 08/21/13 2031 08/21/13 2104 08/22/13 0449  BP: 78/45 92/53  101/57  Pulse: 66 64 76 50  Temp: 98.3 F (36.8 C) 98 F (36.7 C)  97.4 F (36.3 C)  TempSrc: Oral Oral  Oral  Resp: 18 18 16 16   Height:      Weight:      SpO2: 92% 96% 94% 96%    Intake/Output Summary (Last 24 hours) at 08/22/13 1059 Last data filed at 08/22/13 0851  Gross per 24 hour  Intake    720 ml  Output      0 ml  Net    720 ml     Filed Weights   08/18/13 1642  Weight:  62.143 kg (137 lb)    Exam:   Afebrile, vital signs stable.  General: Appears calm and comfortable. Speech fluent and clear.  Cardiovascular: Regular rate and rhythm. No murmur, rub or gallop.  Respiratory: Clear to auscultation bilaterally. No wheezes, rales or rhonchi. Normal respiratory effort.  Abdomen: Soft, nontender, nondistended. Positive bowel sounds.  Data Reviewed: TSH normal.  Scheduled Meds: . ALPRAZolam  1 mg Oral TID  . Chlorhexidine Gluconate Cloth  6 each Topical Q0600  . docusate sodium  100 mg Oral BID  . DULoxetine  120 mg Oral Daily  . enoxaparin (LOVENOX) injection  40 mg Subcutaneous Q24H  . feeding supplement (RESOURCE BREEZE)  1 Container Oral TID BM  . gabapentin  300 mg Oral Daily  . gabapentin  900 mg Oral QHS  . mirtazapine  15 mg Oral QHS  . montelukast  10 mg Oral Daily  . mupirocin ointment  1 application Nasal BID  . nicotine  21 mg Transdermal Daily  . pantoprazole  80 mg Oral Daily  . senna  1 tablet Oral Daily  . topiramate  25 mg Oral TID   Continuous Infusions:    Active Problems:   GERD   VOMITING   CONSTIPATION, HX OF   Abdominal pain   Nausea and vomiting

## 2013-08-24 ENCOUNTER — Ambulatory Visit: Payer: PRIVATE HEALTH INSURANCE | Admitting: Gastroenterology

## 2013-08-31 ENCOUNTER — Emergency Department (HOSPITAL_COMMUNITY): Payer: PRIVATE HEALTH INSURANCE

## 2013-08-31 ENCOUNTER — Encounter (HOSPITAL_COMMUNITY): Payer: Self-pay | Admitting: Emergency Medicine

## 2013-08-31 ENCOUNTER — Emergency Department (HOSPITAL_COMMUNITY)
Admission: EM | Admit: 2013-08-31 | Discharge: 2013-08-31 | Disposition: A | Discharge status: Home / Self Care | Payer: PRIVATE HEALTH INSURANCE | Attending: Emergency Medicine | Admitting: Emergency Medicine

## 2013-08-31 ENCOUNTER — Telehealth: Payer: Self-pay

## 2013-08-31 DIAGNOSIS — J45909 Unspecified asthma, uncomplicated: Secondary | ICD-10-CM | POA: Insufficient documentation

## 2013-08-31 DIAGNOSIS — R109 Unspecified abdominal pain: Secondary | ICD-10-CM

## 2013-08-31 DIAGNOSIS — Z8739 Personal history of other diseases of the musculoskeletal system and connective tissue: Secondary | ICD-10-CM | POA: Insufficient documentation

## 2013-08-31 DIAGNOSIS — Z8619 Personal history of other infectious and parasitic diseases: Secondary | ICD-10-CM | POA: Insufficient documentation

## 2013-08-31 DIAGNOSIS — Z880 Allergy status to penicillin: Secondary | ICD-10-CM | POA: Insufficient documentation

## 2013-08-31 DIAGNOSIS — F909 Attention-deficit hyperactivity disorder, unspecified type: Secondary | ICD-10-CM | POA: Insufficient documentation

## 2013-08-31 DIAGNOSIS — F17200 Nicotine dependence, unspecified, uncomplicated: Secondary | ICD-10-CM | POA: Insufficient documentation

## 2013-08-31 DIAGNOSIS — G43909 Migraine, unspecified, not intractable, without status migrainosus: Secondary | ICD-10-CM | POA: Insufficient documentation

## 2013-08-31 DIAGNOSIS — G8929 Other chronic pain: Secondary | ICD-10-CM | POA: Insufficient documentation

## 2013-08-31 DIAGNOSIS — F4310 Post-traumatic stress disorder, unspecified: Secondary | ICD-10-CM | POA: Insufficient documentation

## 2013-08-31 DIAGNOSIS — J4489 Other specified chronic obstructive pulmonary disease: Secondary | ICD-10-CM | POA: Insufficient documentation

## 2013-08-31 DIAGNOSIS — R112 Nausea with vomiting, unspecified: Secondary | ICD-10-CM | POA: Insufficient documentation

## 2013-08-31 DIAGNOSIS — K589 Irritable bowel syndrome without diarrhea: Secondary | ICD-10-CM | POA: Insufficient documentation

## 2013-08-31 DIAGNOSIS — J449 Chronic obstructive pulmonary disease, unspecified: Secondary | ICD-10-CM | POA: Insufficient documentation

## 2013-08-31 DIAGNOSIS — R1013 Epigastric pain: Secondary | ICD-10-CM | POA: Insufficient documentation

## 2013-08-31 DIAGNOSIS — K5900 Constipation, unspecified: Secondary | ICD-10-CM | POA: Insufficient documentation

## 2013-08-31 DIAGNOSIS — Z8711 Personal history of peptic ulcer disease: Secondary | ICD-10-CM | POA: Insufficient documentation

## 2013-08-31 DIAGNOSIS — Z79899 Other long term (current) drug therapy: Secondary | ICD-10-CM | POA: Insufficient documentation

## 2013-08-31 DIAGNOSIS — Z8614 Personal history of Methicillin resistant Staphylococcus aureus infection: Secondary | ICD-10-CM | POA: Insufficient documentation

## 2013-08-31 LAB — LIPASE, BLOOD: Lipase: 21 U/L (ref 11–59)

## 2013-08-31 LAB — CBC WITH DIFFERENTIAL/PLATELET
Eosinophils Absolute: 0.2 10*3/uL (ref 0.0–0.7)
Eosinophils Relative: 1 % (ref 0–5)
Hemoglobin: 13.6 g/dL (ref 12.0–15.0)
Lymphs Abs: 1.3 10*3/uL (ref 0.7–4.0)
MCH: 30.4 pg (ref 26.0–34.0)
Monocytes Relative: 3 % (ref 3–12)
Neutro Abs: 12.2 10*3/uL — ABNORMAL HIGH (ref 1.7–7.7)
Neutrophils Relative %: 86 % — ABNORMAL HIGH (ref 43–77)
Platelets: 374 10*3/uL (ref 150–400)
RBC: 4.48 MIL/uL (ref 3.87–5.11)
WBC: 14.2 10*3/uL — ABNORMAL HIGH (ref 4.0–10.5)

## 2013-08-31 LAB — COMPREHENSIVE METABOLIC PANEL
ALT: 24 U/L (ref 0–35)
Albumin: 4.1 g/dL (ref 3.5–5.2)
Alkaline Phosphatase: 74 U/L (ref 39–117)
BUN: 14 mg/dL (ref 6–23)
Chloride: 103 mEq/L (ref 96–112)
Glucose, Bld: 130 mg/dL — ABNORMAL HIGH (ref 70–99)
Potassium: 3.8 mEq/L (ref 3.5–5.1)
Sodium: 141 mEq/L (ref 135–145)
Total Bilirubin: 0.2 mg/dL — ABNORMAL LOW (ref 0.3–1.2)
Total Protein: 7 g/dL (ref 6.0–8.3)

## 2013-08-31 MED ORDER — LORAZEPAM 2 MG/ML IJ SOLN
0.5000 mg | Freq: Once | INTRAMUSCULAR | Status: AC
  Administered 2013-08-31: 0.5 mg via INTRAVENOUS
  Filled 2013-08-31: qty 1

## 2013-08-31 MED ORDER — PANTOPRAZOLE SODIUM 40 MG PO TBEC
40.0000 mg | DELAYED_RELEASE_TABLET | Freq: Once | ORAL | Status: DC
  Filled 2013-08-31: qty 1

## 2013-08-31 MED ORDER — SODIUM CHLORIDE 0.9 % IV BOLUS (SEPSIS)
1000.0000 mL | Freq: Once | INTRAVENOUS | Status: AC
  Administered 2013-08-31: 1000 mL via INTRAVENOUS

## 2013-08-31 MED ORDER — HYDROMORPHONE HCL PF 1 MG/ML IJ SOLN
1.0000 mg | Freq: Once | INTRAMUSCULAR | Status: AC
  Administered 2013-08-31: 1 mg via INTRAVENOUS
  Filled 2013-08-31: qty 1

## 2013-08-31 MED ORDER — ONDANSETRON HCL 4 MG/2ML IJ SOLN
4.0000 mg | Freq: Once | INTRAMUSCULAR | Status: AC
  Administered 2013-08-31: 4 mg via INTRAVENOUS

## 2013-08-31 MED ORDER — ONDANSETRON HCL 4 MG/2ML IJ SOLN
INTRAMUSCULAR | Status: AC
  Filled 2013-08-31: qty 2

## 2013-08-31 MED ORDER — PANTOPRAZOLE SODIUM 40 MG IV SOLR
INTRAVENOUS | Status: AC
  Administered 2013-08-31: 40 mg via INTRAVENOUS
  Filled 2013-08-31: qty 40

## 2013-08-31 MED ORDER — PROMETHAZINE HCL 25 MG RE SUPP: 25.0000 mg | Freq: Four times a day (QID) | RECTAL | Status: DC | PRN

## 2013-08-31 MED ORDER — ONDANSETRON HCL 4 MG/2ML IJ SOLN: 4.0000 mg | Freq: Once | INTRAMUSCULAR | Status: DC

## 2013-08-31 MED ORDER — PANTOPRAZOLE SODIUM 40 MG IV SOLR
40.0000 mg | Freq: Once | INTRAVENOUS | Status: AC
  Administered 2013-08-31: 40 mg via INTRAVENOUS

## 2013-08-31 NOTE — Telephone Encounter (Signed)
Pt called and said she was seen in ED again for abdominal pain. She is home now and said the pain is gone now, but they gave her a shot for the pain. X-ray showed some constipation. Taking stool softners everyday now. Taking Miralax every other day. She said she was given phenergan for nausea and that helps better than Zofran. Pt wants to be seen right away, says she cannot keep going like she is now. Please advise!

## 2013-08-31 NOTE — Telephone Encounter (Signed)
Give pt 1st available urgent spot for abdominal pain e30. PT SHOULD TAKE MIRALAX Q1H FOR 3 HOURS mon, tue, and wed.

## 2013-08-31 NOTE — ED Notes (Signed)
Pt alert & oriented x4, stable gait. Patient given discharge instructions, paperwork & prescription(s). Patient  instructed to stop at the registration desk to finish any additional paperwork. Patient verbalized understanding. Pt left department w/ no further questions. 

## 2013-08-31 NOTE — Telephone Encounter (Signed)
Called and informed pt. Ov on 09/07/2013 at 1:15 with LL in urgent slot per SF.

## 2013-08-31 NOTE — ED Provider Notes (Signed)
CSN: [id number]     Arrival date & time 08/31/13  9629 History  This chart was scribed for [name], MD by Bennett Scrape, ED Scribe. This patient was seen in room APA11/APA11 and the patient's care was started at 8:39 AM.   Chief Complaint  Patient presents with  . Nausea  . Emesis  . Abdominal Pain    Patient is a 52 y.o. female presenting with abdominal pain. The history is provided by the patient. No language interpreter was used.  Abdominal Pain Pain location:  Epigastric Pain radiates to:  Does not radiate Pain severity:  Moderate Onset quality:  Gradual Duration: this morning. Timing:  Intermittent Chronicity:  Chronic Associated symptoms: constipation, nausea and vomiting   Associated symptoms: no chest pain, no cough, no diarrhea, no fatigue and no hematuria     HPI Comments: [name] is a 52 y.o. female with a h/o chronic abdominal pain and IBS who presents to the Emergency Department complaining of a flare up of her chronic lower abdominal pain with associated emesis that started this morning. She reports that the symptoms come in intermittent waves. She was last seen for the same on 08/13/13 and 08/18/13. She was admitted for the same on 08/18/13 to general medicine for symptom control. She is currently on Ultram and was started on Colace to help with constipation. She denies any improvement with either and denies being out of her chronic pain medication. Last BM was 2 days ago. She denies any other symptoms.    Past Medical History  Diagnosis Date  . S/P colonoscopy 06/02/09    normal (Dr. Linna Darner)  . PUD (peptic ulcer disease) 01/2007    EGD Dr Jena Gauss, 2 antral ulcers, negative h pylori  . MRSA infection     Dr. Lenice Pressman, currently under treatment  . Hiatal hernia 2008    on EGD above  . Asthma   . PTSD (post-traumatic stress disorder)   . Migraines   . Degenerative disc disease   . Rectal prolapse   . SBO (small bowel obstruction)   .  ADHD (attention deficit hyperactivity disorder)   . S/P endoscopy 03/26/11    gastritis-Dr Darrick Penna  . Clostridium difficile colitis 04/10/11 &11/2011    tx w/ flagyl  . Chronic abdominal pain 2003    EX LAP APR 2004 RUPTURE L OV CYST, JUL 2004 ADHESIONS  . Irritable bowel syndrome 2004 CONSTIPATION  . BMI (body mass index) 20.0-29.9 2009 127 LBS  . COPD (chronic obstructive pulmonary disease)   . Chronic nausea   . Anginal pain    Past Surgical History  Procedure Laterality Date  . Bowel resection      x2, secondary to adhesions  . Appendectomy    . Laparoscopic lysis intestinal adhesions    . Back surgery    . Abdominal hysterectomy    . Tubal ligation    . Umbilical hernia repair  2008    x5  . Neck surgery  2009  . Partial hysterectomy    . Upper gastrointestinal endoscopy  APR 2008 [facility] BLEEDING/PAIN    PUD  . Upper gastrointestinal endoscopy  JUL 2012 [facility] PAIN    MILD GASTRITIS  . Esophagogastroduodenoscopy  03/23/2011     Normal esophagus without evidence of Barrett's, mass, erosions, or ulcerations./ Patchy erythema with occasional erosion in the antrum.  Biopsies  obtained via cold forceps to evaluate for H. pylori gastritis/ Small hiatal hernia./Normal duodenal bulb and second portion of the duodenum.  Marland Kitchen  Cervical spine surgery    . Flexible sigmoidoscopy  07/14/2002    Normal limited flexible sigmoidoscopy with stool in the rectum and rectosigmoid precluding a full colonoscopy  . Colonoscopy  03/21/2012     [id number] polyp in the transverse colon/Sessile polyp in the rectum/Internal hemorrhoids/. INCOMPLETE DUE TO POOR PREP.PATIENT WAS SUPPOSED TO HAVE REPEAT BUT DID NOT  . Esophagogastroduodenoscopy  03/21/2012    UJW:[id number] Hiatal hernia/ABDOMINALPAIN/DIARRHEA MOST LIKELY DUE TO IBS, GASTRITIS DUODENITIS   Family History  Problem Relation Age of Onset  . Adopted: Yes   History  Substance Use Topics  . Smoking status: Current Every Day Smoker -- 0.10 packs/day for  10 years    Types: Cigarettes  . Smokeless tobacco: Former Neurosurgeon  . Alcohol Use: No   OB History   Grav Para Term Preterm Abortions TAB SAB Ect Mult Living   1 1 1       1      Review of Systems  Constitutional: Negative for appetite change and fatigue.  HENT: Negative for congestion, ear discharge and sinus pressure.   Eyes: Negative for discharge.  Respiratory: Negative for cough.   Cardiovascular: Negative for chest pain.  Gastrointestinal: Positive for nausea, vomiting, abdominal pain and constipation. Negative for diarrhea.  Genitourinary: Negative for frequency and hematuria.  Musculoskeletal: Negative for back pain.  Skin: Negative for rash.  Neurological: Negative for seizures and headaches.  Psychiatric/Behavioral: Negative for hallucinations.    Allergies  Penicillins; Sulfa antibiotics; Aspirin; Ibuprofen; Lactaid; and Morphine and related  Home Medications   Current Outpatient Rx  Name  Route  Sig  Dispense  Refill  . albuterol (PROVENTIL,VENTOLIN) 90 MCG/ACT inhaler   Inhalation   Inhale 2 puffs into the lungs every 6 (six) hours as needed. For shortness of breath         . ALPRAZolam (XANAX) 1 MG tablet   Oral   Take 1-2 mg by mouth 3 (three) times daily. Patient takes 1 tablet twice a day and 2 tablets at bedtime         . amphetamine-dextroamphetamine (ADDERALL XR) 15 MG 24 hr capsule   Oral   Take 15 mg by mouth daily.          . cyclobenzaprine (FLEXERIL) 5 MG tablet   Oral   Take 5 mg by mouth every 8 (eight) hours as needed for muscle spasms.         Marland Kitchen dexlansoprazole (DEXILANT) 60 MG capsule   Oral   Take 60 mg by mouth every morning.          . dicyclomine (BENTYL) 10 MG capsule   Oral   Take 10 mg by mouth 3 (three) times daily.         Marland Kitchen docusate sodium 100 MG CAPS   Oral   Take 100 mg by mouth 2 (two) times daily.         . DULoxetine (CYMBALTA) 60 MG capsule   Oral   Take 120 mg by mouth every morning.          .  gabapentin (NEURONTIN) 300 MG capsule   Oral   Take 300-900 mg by mouth 2 (two) times daily. 1 capsule in the morning and 3 capsules at bedtime         . HYDROcodone-acetaminophen (NORCO/VICODIN) 5-325 MG per tablet   Oral   Take 1-2 tablets by mouth every 6 (six) hours as needed for moderate pain.   20 tablet   0   .  Lactobacillus (ACIDOPHILUS) CAPS   Oral   Take 2 capsules by mouth daily.          Marland Kitchen lidocaine (LIDODERM) 5 %   Transdermal   Place 1 patch onto the skin daily as needed. Uses for pain in back. Not used regularly.         . mirtazapine (REMERON) 15 MG tablet   Oral   Take 1 tablet by mouth at bedtime.         . montelukast (SINGULAIR) 10 MG tablet   Oral   Take 10 mg by mouth daily.          . Multiple Vitamin (MULTIVITAMIN WITH MINERALS) TABS   Oral   Take 1 tablet by mouth daily.         . nicotine (NICODERM CQ - DOSED IN MG/24 HOURS) 21 mg/24hr patch   Transdermal   Place 1 patch onto the skin daily.   28 patch   0   . ondansetron (ZOFRAN ODT) 4 MG disintegrating tablet   Oral   Take 1 tablet (4 mg total) by mouth every 8 (eight) hours as needed.   12 tablet   1   . polyethylene glycol (MIRALAX) packet   Oral   Take 17 g by mouth daily.         Marland Kitchen topiramate (TOPAMAX) 25 MG tablet   Oral   Take 25 mg by mouth 3 (three) times daily.         . traMADol (ULTRAM) 50 MG tablet   Oral   Take 50 mg by mouth every 8 (eight) hours as needed. For pain         . zolpidem (AMBIEN CR) 12.5 MG CR tablet   Oral   Take 12.5 mg by mouth at bedtime as needed. For sleep          Triage Vitals: BP 113/57  Pulse 68  Temp(Src) 97.9 F (36.6 C)  Resp 22  SpO2 98%  Physical Exam  Nursing note and vitals reviewed. Constitutional: She is oriented to person, place, and time. She appears well-developed and well-nourished.  HENT:  Head: Normocephalic and atraumatic.  Eyes: Conjunctivae and EOM are normal. No scleral icterus.  Neck: Neck  supple. No thyromegaly present.  Cardiovascular: Normal rate and regular rhythm.  Exam reveals no gallop and no friction rub.   No murmur heard. Pulmonary/Chest: Effort normal and breath sounds normal. No stridor. She has no wheezes. She has no rales. She exhibits no tenderness.  Abdominal: Soft. She exhibits no distension. There is tenderness (moderate epigastric tenderness ). There is no rebound.  Musculoskeletal: Normal range of motion. She exhibits no edema.  Lymphadenopathy:    She has no cervical adenopathy.  Neurological: She is alert and oriented to person, place, and time. She exhibits normal muscle tone. Coordination normal.  Skin: Skin is warm and dry. No rash noted. No erythema.  Psychiatric: She has a normal mood and affect. Her behavior is normal.    ED Course  Procedures (including critical care time)  Medications  ondansetron (ZOFRAN) injection 4 mg (not administered)  ondansetron (ZOFRAN) 4 MG/2ML injection (not administered)  sodium chloride 0.9 % bolus 1,000 mL (not administered)  HYDROmorphone (DILAUDID) injection 1 mg (not administered)  LORazepam (ATIVAN) injection 0.5 mg (not administered)  pantoprazole (PROTONIX) EC tablet 40 mg (not administered)    DIAGNOSTIC STUDIES: Oxygen Saturation is 98% on room air, normal by my interpretation.    COORDINATION OF CARE: 8:41  AM-Discussed treatment plan which includes medications, x-ray of the abdomen, CBC panel and CMP with pt at bedside and pt agreed to plan.   10:44 AM-Pt rechecked and feels improved with medications listed above. She states that the nausea is resolved but reports that she has not urinated yet. Informed pt of normal lab and radiology work up thus far. Discussed IV fluids to help with possible dehydration and pt agreed.   11:44 AM-Pt rechecked and is resting comfortably after the IV fluids. Discussed discharge plan which includes phenergan suppositories with pt and pt agreed to plan. Also advised pt to  follow up as needed and pt agreed. Addressed symptoms to return for with pt.   Labs Review Labs Reviewed  CBC WITH DIFFERENTIAL - Abnormal; Notable for the following:    WBC 14.2 (*)    Neutrophils Relative % 86 (*)    Neutro Abs 12.2 (*)    Lymphocytes Relative 9 (*)    All other components within normal limits  COMPREHENSIVE METABOLIC PANEL - Abnormal; Notable for the following:    Glucose, Bld 130 (*)    Total Bilirubin 0.2 (*)    All other components within normal limits  LIPASE, BLOOD   Imaging Review Dg Abd Acute W/chest  08/31/2013   CLINICAL DATA:  History of chronic abdominal discomfort with acute flare with and nausea and vomiting  EXAM: ACUTE ABDOMEN SERIES (ABDOMEN 2 VIEW & CHEST 1 VIEW)  COMPARISON:  August 20, 2013.  FINDINGS: The chest fields film reveals the lungs to be well-expanded. There is mild hyperlucency which may reflect underlying COPD. There is stable density in the left lateral lower lung field consistent with atelectasis or scarring. There small hiatal hernia. The cardiac silhouette is normal in size. The patient has undergone previous lower cervical fusion. Within the abdomen there is a moderate amount of stool within the right and left portions of the colon. There is a small amount of small bowel gas in the left mid abdomen. There is gas and stool in the region of the rectum. The patient has undergone previous lower lumbar fusion. Abdominal wall: L clips are present in the mid abdomen. The bony structures are normal in appearance. There are phleboliths within the pelvis.  IMPRESSION: 1. The bowel gas pattern is nonspecific but may reflect an element of constipation. There is no evidence of obstruction or significant ileus. There is no free extraluminal gas demonstrated. 2. The lungs reveal findings consistent with COPD. There is likely scarring versus atelectasis at the left lung base.   Electronically Signed   By: David  [place]   On: 08/31/2013 09:28    EKG  Interpretation   None       MDM  Abdominal pain and irritable bowel  The chart was scribed for me under my direct supervision.  I personally performed the history, physical, and medical decision making and all procedures in the evaluation of this patient.[name], MD 08/31/13 1146

## 2013-08-31 NOTE — Telephone Encounter (Signed)
LMOM to call.

## 2013-08-31 NOTE — ED Notes (Signed)
Pt requesting RX for Phenergan Suppositories upon discharge for nausea, EDP aware

## 2013-08-31 NOTE — ED Notes (Signed)
Vomiting upon waking this morning.  No BM in 2 days, on chronic pain medication.

## 2013-09-01 NOTE — Telephone Encounter (Signed)
Called and informed pt.  

## 2013-09-01 NOTE — Telephone Encounter (Signed)
REVIEWED.  PLEASE CALL PT. RESTART MIRALAX BID QMWF THEN QD ON OTHER DAYS.

## 2013-09-01 NOTE — Telephone Encounter (Signed)
Pt called and said she took the Miralax 3 times yesterday and has taken it twice today and has diarrhea and she dehydates so easily. I told her not to take any more today and I will see when Dr. Darrick Penna wants her to start back up.

## 2013-09-07 ENCOUNTER — Encounter: Payer: Self-pay | Admitting: Gastroenterology

## 2013-09-07 ENCOUNTER — Ambulatory Visit (INDEPENDENT_AMBULATORY_CARE_PROVIDER_SITE_OTHER): Payer: PRIVATE HEALTH INSURANCE | Admitting: Gastroenterology

## 2013-09-07 ENCOUNTER — Telehealth: Payer: Self-pay | Admitting: Gastroenterology

## 2013-09-07 VITALS — BP 116/75 | HR 90 | Temp 98.5°F | Wt 138.6 lb

## 2013-09-07 DIAGNOSIS — Z86010 Personal history of colonic polyps: Secondary | ICD-10-CM

## 2013-09-07 DIAGNOSIS — K219 Gastro-esophageal reflux disease without esophagitis: Secondary | ICD-10-CM

## 2013-09-07 DIAGNOSIS — Z8719 Personal history of other diseases of the digestive system: Secondary | ICD-10-CM

## 2013-09-07 DIAGNOSIS — Z860101 Personal history of adenomatous and serrated colon polyps: Secondary | ICD-10-CM

## 2013-09-07 DIAGNOSIS — R1011 Right upper quadrant pain: Secondary | ICD-10-CM

## 2013-09-07 MED ORDER — PROMETHAZINE HCL 25 MG PO TABS: 25.0000 mg | ORAL_TABLET | Freq: Four times a day (QID) | ORAL | Status: DC | PRN

## 2013-09-07 MED ORDER — ESOMEPRAZOLE MAGNESIUM 40 MG PO CPDR: 40.0000 mg | DELAYED_RELEASE_CAPSULE | Freq: Every day | ORAL | Status: DC

## 2013-09-07 NOTE — Telephone Encounter (Signed)
Patient is asking for Phenergan tablets called to her pharmacy for nausea, please advise

## 2013-09-07 NOTE — Assessment & Plan Note (Signed)
More frequent ruq pain, nausea worse with meals. HIDA ok last year. 3-4 years since last abd [place]. abd u/s to look for stones.

## 2013-09-07 NOTE — Patient Instructions (Signed)
1. Abdominal u/s as schedule. 2. Stop Dexilant, Begin Nexium one capsule daily before breakfast.

## 2013-09-07 NOTE — Assessment & Plan Note (Signed)
Doing better on current Miralax regimen. Avoid bentyl as much as possible due to constipation.

## 2013-09-07 NOTE — Progress Notes (Signed)
Primary Care Physician: Elby Showers, MD  Primary Gastroenterologist:  Jonette Eva, MD   Chief Complaint  Patient presents with  . Follow-up    HPI: [name] is a 52 y.o. female here for f/u. Saw 08/18/13 hospital stay. She presented with abdominal pain, N/V/D at that time. ?gastroenteritis vs ileus vs constipation. CT A/P which showed fluid filled mildly distended small and large bowel suggesting enteritis and mild ileus. Contrast was not in the distal small bowel. Abd film on 08/18/13 showed large volume of stool throughout the colon. Took significant doses of Miralax to get results while inpatient. Back in ED 08/31/13 and xray suggestive of constipation. She was advised Miralax change. Currently should be no  MIRALAX BID QMWF THEN QD ON OTHER DAYS but decreased on her own because BMs better.   States her lower abdominal pain has resolved. She complains of flare of her heartburn/epigastric discomfort. Pepcid or TUMS on top of Dexilant. Especially mid-day. Stays away from citric fruit. Sometimes nocturnal. nexium works the best. Heartburn worse since out of the hospital. No melena, brbpr. About six stools per day but not loose. Some gas. All pain in upper abd and worse with meals. Complains of chronic coliky ruq pain with meals. Rarely takes bentyl.    Current Outpatient Prescriptions  Medication Sig Dispense Refill  . albuterol (PROVENTIL,VENTOLIN) 90 MCG/ACT inhaler Inhale 2 puffs into the lungs every 6 (six) hours as needed. For shortness of breath      . ALPRAZolam (XANAX) 1 MG tablet Take 1-2 mg by mouth 3 (three) times daily. Patient takes 1 tablet twice a day and 2 tablets at bedtime      . amphetamine-dextroamphetamine (ADDERALL XR) 15 MG 24 hr capsule Take 15 mg by mouth daily.       . cyclobenzaprine (FLEXERIL) 5 MG tablet Take 5 mg by mouth every 8 (eight) hours as needed for muscle spasms.      Marland Kitchen dexlansoprazole (DEXILANT) 60 MG capsule Take 60 mg by mouth every morning.        . dicyclomine (BENTYL) 10 MG capsule Take 10 mg by mouth 3 (three) times daily. For abdominal cramps only.      . docusate sodium 100 MG CAPS Take 100 mg by mouth 2 (two) times daily.      . DULoxetine (CYMBALTA) 60 MG capsule Take 120 mg by mouth every morning.       . gabapentin (NEURONTIN) 300 MG capsule Take 300-900 mg by mouth 2 (two) times daily. 1 capsule in the morning and 3 capsules at bedtime      . HYDROcodone-acetaminophen (NORCO) 10-325 MG per tablet Take 1 tablet by mouth every 4 (four) hours as needed.       . lidocaine (LIDODERM) 5 % Place 1 patch onto the skin daily as needed. Uses for pain in back. Not used regularly.      . montelukast (SINGULAIR) 10 MG tablet Take 10 mg by mouth daily.       . Multiple Vitamin (MULTIVITAMIN WITH MINERALS) TABS Take 1 tablet by mouth daily.      . nicotine (NICODERM CQ - DOSED IN MG/24 HOURS) 21 mg/24hr patch Place 1 patch onto the skin daily.  28 patch  0  . polyethylene glycol (MIRALAX / GLYCOLAX) packet Take 17 g by mouth daily. MWF, once daily.      . promethazine (PHENERGAN) 25 MG suppository Place 1 suppository (25 mg total) rectally every 6 (six) hours as needed for nausea or  vomiting.  12 each  0  . topiramate (TOPAMAX) 25 MG tablet Take 25 mg by mouth 3 (three) times daily.      Marland Kitchen zolpidem (AMBIEN CR) 12.5 MG CR tablet Take 12.5 mg by mouth at bedtime as needed. For sleep       No current facility-administered medications for this visit.    Allergies as of 09/07/2013 - Review Complete 09/07/2013  Allergen Reaction Noted  . Penicillins Anaphylaxis   . Sulfa antibiotics Itching 05/24/2011  . Aspirin Other (See Comments)   . Ibuprofen Nausea Only 10/08/2011  . Lactaid [lactase] Diarrhea and Other (See Comments) 02/06/2012  . Morphine and related Itching 07/15/2011    ROS:  General: Negative for anorexia, fever, chills, fatigue, weakness. Weight down from 155 to 138 since 05/2012. ENT: Negative for hoarseness, difficulty  swallowing , nasal congestion. CV: Negative for chest pain, angina, palpitations, dyspnea on exertion, peripheral edema.  Respiratory: Negative for dyspnea at rest, dyspnea on exertion, cough, sputum, wheezing.  GI: See history of present illness. GU:  Negative for dysuria, hematuria, urinary incontinence, urinary frequency, nocturnal urination.  Endo: Negative for unusual weight change.    Physical Examination:   BP 116/75  Pulse 90  Temp(Src) 98.5 F (36.9 C) (Oral)  Wt 138 lb 9.6 oz (62.869 kg)  General: Well-nourished, well-developed in no acute distress.  Eyes: No icterus. Mouth: Oropharyngeal mucosa moist and pink , no lesions erythema or exudate. Lungs: Clear to auscultation bilaterally.  Heart: Regular rate and rhythm, no murmurs rubs or gallops.  Abdomen: Bowel sounds are normal, moderate epig/ruq tenderness, nondistended, no hepatosplenomegaly or masses, no abdominal bruits or hernia , no rebound or guarding.   Extremities: No lower extremity edema. No clubbing or deformities. Neuro: Alert and oriented x 4   Skin: Warm and dry, no jaundice.   Psych: Alert and cooperative, normal mood and affect.  Labs:  Lab Results  Component Value Date   LIPASE 21 08/31/2013   Lab Results  Component Value Date   WBC 14.2* 08/31/2013   HGB 13.6 08/31/2013   HCT 42.5 08/31/2013   MCV 94.9 08/31/2013   PLT 374 08/31/2013   Lab Results  Component Value Date   TSH 1.125 08/21/2013    Imaging Studies: Ct Abdomen Pelvis W Contrast  08/13/2013   CLINICAL DATA:  Severe abdominal pain and emesis, periumbilical pain radiating to left lower quadrant for 3 days. History of bowel obstruction and abdominal surgery.  EXAM: CT ABDOMEN AND PELVIS WITH CONTRAST  TECHNIQUE: Multidetector CT imaging of the abdomen and pelvis was performed using the standard protocol following bolus administration of intravenous contrast.  CONTRAST:  50mL OMNIPAQUE IOHEXOL 300 MG/ML SOLN, OMNIPAQUE IOHEXOL  300 MG/ML SOLN  COMPARISON:  Abdominal radiograph April 11, 2013. CT of the abdomen and pelvis Feb 05, 2013  FINDINGS: Limited view of the lung bases demonstrate minimal dependent atelectasis and are otherwise unremarkable. Included heart and pericardium are unremarkable.  Small hiatal hernia. The stomach, small and large demonstrate mild fluid distension, with air-fluid levels and multiple transition points. No pneumatosis. Sigmoid anastomosis again noted, contrast has yet to reach the distal small bowel. Fecalization of small bowel contents, with a solitary loop of distended small bowel in the pelvis. Air and stool seen in the large bowel. Trace ascites without drainable fluid collection.  The liver, spleen, pancreas, gallbladder and adrenal gland are unremarkable.  Kidneys are well located, normal in appearance. Great vessels are normal course and caliber with mild  intimal thickening. Internal reproductive organs appear surgically absent. Urinary bladder is deep is unremarkable. Phleboliths in the pelvis.  Coil material along the anterior abdominal wall consistent with prior herniorrhaphy. Small right fat containing inguinal hernia. L4-5 and L5-S1 PLIF.  IMPRESSION: Fluid filled mildly distended small and large bowel, suggesting enteritis and mild ileus, this is unlikely to reflect a small bowel obstruction. No bowel perforation. Trace ascites.   Electronically Signed   By: Awilda Metro   On: 08/13/2013 13:23    Dg Abd Acute W/chest  08/31/2013   CLINICAL DATA:  History of chronic abdominal discomfort with acute flare with and nausea and vomiting  EXAM: ACUTE ABDOMEN SERIES (ABDOMEN 2 VIEW & CHEST 1 VIEW)  COMPARISON:  August 20, 2013.  FINDINGS: The chest fields film reveals the lungs to be well-expanded. There is mild hyperlucency which may reflect underlying COPD. There is stable density in the left lateral lower lung field consistent with atelectasis or scarring. There small hiatal hernia. The  cardiac silhouette is normal in size. The patient has undergone previous lower cervical fusion. Within the abdomen there is a moderate amount of stool within the right and left portions of the colon. There is a small amount of small bowel gas in the left mid abdomen. There is gas and stool in the region of the rectum. The patient has undergone previous lower lumbar fusion. Abdominal wall: L clips are present in the mid abdomen. The bony structures are normal in appearance. There are phleboliths within the pelvis.  IMPRESSION: 1. The bowel gas pattern is nonspecific but may reflect an element of constipation. There is no evidence of obstruction or significant ileus. There is no free extraluminal gas demonstrated. 2. The lungs reveal findings consistent with COPD. There is likely scarring versus atelectasis at the left lung base.   Electronically Signed   By: David  [place]   On: 08/31/2013 09:28   Dg Abd Acute W/chest  08/18/2013   CLINICAL DATA:  Abdominal pain. Irritable bowel syndrome.  EXAM: ACUTE ABDOMEN SERIES (ABDOMEN 2 VIEW & CHEST 1 VIEW)  COMPARISON:  PA and lateral chest 06/08/2013 and CT abdomen and pelvis 08/13/2013.  FINDINGS: Single view of the chest demonstrates a small focus upper atelectasis or scar in the left lung base. The lungs appear emphysematous but are otherwise clear. No pneumothorax or pleural fluid is identified.  Two views of the abdomen show no free intraperitoneal air. There appears to be a large volume of stool throughout the colon. No evidence of bowel obstruction is identified. The patient is status post hernia repair and lower lumbar fusion.  IMPRESSION: No acute finding chest or abdomen.  Large volume of stool throughout the colon.  Possible emphysema.   Electronically Signed   By: Drusilla Kanner M.D.   On: 08/18/2013 09:55

## 2013-09-07 NOTE — Assessment & Plan Note (Signed)
Breakthrough on Dexilant over past month or so. Patient previously failed prilosec. Liked nexium the best. Has tried prevacid and pantoprazole. nexium 40mg  daily. Samples and RX provided.  Antireflux measures.

## 2013-09-07 NOTE — Assessment & Plan Note (Signed)
Due for repeat TCS since incomplete TCS (poor prep) and had tubular adenomas. Await abd u/s findings. Consider in near future if patient agreeable.

## 2013-09-08 NOTE — Progress Notes (Signed)
cc'd to pcp 

## 2013-09-10 ENCOUNTER — Ambulatory Visit (HOSPITAL_COMMUNITY): Payer: PRIVATE HEALTH INSURANCE

## 2013-09-14 ENCOUNTER — Ambulatory Visit (HOSPITAL_COMMUNITY): Payer: PRIVATE HEALTH INSURANCE

## 2013-09-14 ENCOUNTER — Other Ambulatory Visit: Payer: Self-pay

## 2013-09-14 ENCOUNTER — Telehealth: Payer: Self-pay

## 2013-09-14 DIAGNOSIS — R110 Nausea: Secondary | ICD-10-CM

## 2013-09-14 NOTE — Telephone Encounter (Signed)
Pt left Vm that she was scheduled for abdominal US this morning and did not go and did not call to tell them that she wasn't coming. Said she is still so sick on her stomach and she has horrible pain at times in her back and ribs, but not in her stomach. She was supposed to be NPO after midnight and she was so sick on her stomach she had to try to drink a little something and eat just a little something. She wants to know what she should do now. Please advise!

## 2013-09-14 NOTE — Telephone Encounter (Signed)
Called and informed pt. Per Soledad Gerlach, pt needs to call and reschedule the [place] immediately, since she had a prior authorization, and it will expire. Pt said she will. Lab orders faxed to [facility].

## 2013-09-14 NOTE — Telephone Encounter (Signed)
She needs to have the abdominal u/s done, please reschedule. I would recommend repeat CBC, LFTs, lipase. Take phenergan which she should have on hand. Clear liquid diet as long as has nausea. For severe pain, go to ER.

## 2013-09-15 NOTE — Telephone Encounter (Signed)
Pt is rescheduled for abd u/s on 12/15 at 10am. Pt is aware of appt

## 2013-09-21 ENCOUNTER — Ambulatory Visit (HOSPITAL_COMMUNITY)
Admission: RE | Admit: 2013-09-21 | Discharge: 2013-09-21 | Disposition: A | Discharge status: Home / Self Care | Payer: PRIVATE HEALTH INSURANCE | Source: Ambulatory Visit | Attending: Gastroenterology | Admitting: Gastroenterology

## 2013-09-21 DIAGNOSIS — K219 Gastro-esophageal reflux disease without esophagitis: Secondary | ICD-10-CM

## 2013-09-21 DIAGNOSIS — Z8719 Personal history of other diseases of the digestive system: Secondary | ICD-10-CM

## 2013-09-21 DIAGNOSIS — R932 Abnormal findings on diagnostic imaging of liver and biliary tract: Secondary | ICD-10-CM | POA: Insufficient documentation

## 2013-09-21 DIAGNOSIS — R109 Unspecified abdominal pain: Secondary | ICD-10-CM | POA: Insufficient documentation

## 2013-09-21 DIAGNOSIS — R1011 Right upper quadrant pain: Secondary | ICD-10-CM

## 2013-09-24 ENCOUNTER — Telehealth: Payer: Self-pay

## 2013-09-24 MED ORDER — PROMETHAZINE HCL 25 MG RE SUPP: 25.0000 mg | Freq: Four times a day (QID) | RECTAL | Status: DC | PRN

## 2013-09-24 NOTE — Telephone Encounter (Signed)
See u/s result note. Needs to have her labs CBC, LFT, Lipase from 09/14/13 if she has not. RX for promethazine sent. Increase Nexium 40mg  BID for next four weeks. We can give samples to supplement RX. Needs OV with [facility] only (E30) in 2-3 weeks for f/u and to consider repeat TCS.

## 2013-09-24 NOTE — Telephone Encounter (Signed)
Pt will go have the labs done. She is aware of the new Rx. If she need samples she will call or come by.  Darl Pikes Can you get her an appointment with [facility].

## 2013-09-24 NOTE — Telephone Encounter (Signed)
Pt was calling about her [place] results because she is still hurting. She is also want to have some Promethazine suppository send into her Pharmacy. She has started feeling nausea and has the heart burn real bad. Please advise

## 2013-09-24 NOTE — Progress Notes (Signed)
Pt aware of results 

## 2013-09-26 ENCOUNTER — Emergency Department (HOSPITAL_COMMUNITY)
Admission: EM | Admit: 2013-09-26 | Discharge: 2013-09-26 | Disposition: A | Discharge status: Home / Self Care | Payer: PRIVATE HEALTH INSURANCE | Attending: Emergency Medicine | Admitting: Emergency Medicine

## 2013-09-26 ENCOUNTER — Encounter (HOSPITAL_COMMUNITY): Payer: Self-pay | Admitting: Emergency Medicine

## 2013-09-26 ENCOUNTER — Telehealth: Payer: Self-pay | Admitting: Internal Medicine

## 2013-09-26 DIAGNOSIS — Z8739 Personal history of other diseases of the musculoskeletal system and connective tissue: Secondary | ICD-10-CM | POA: Insufficient documentation

## 2013-09-26 DIAGNOSIS — R112 Nausea with vomiting, unspecified: Secondary | ICD-10-CM | POA: Insufficient documentation

## 2013-09-26 DIAGNOSIS — Z8614 Personal history of Methicillin resistant Staphylococcus aureus infection: Secondary | ICD-10-CM | POA: Diagnosis not present

## 2013-09-26 DIAGNOSIS — Z880 Allergy status to penicillin: Secondary | ICD-10-CM | POA: Insufficient documentation

## 2013-09-26 DIAGNOSIS — Z9851 Tubal ligation status: Secondary | ICD-10-CM | POA: Diagnosis not present

## 2013-09-26 DIAGNOSIS — J449 Chronic obstructive pulmonary disease, unspecified: Secondary | ICD-10-CM | POA: Diagnosis not present

## 2013-09-26 DIAGNOSIS — Z9089 Acquired absence of other organs: Secondary | ICD-10-CM | POA: Insufficient documentation

## 2013-09-26 DIAGNOSIS — Z79899 Other long term (current) drug therapy: Secondary | ICD-10-CM | POA: Diagnosis not present

## 2013-09-26 DIAGNOSIS — K219 Gastro-esophageal reflux disease without esophagitis: Secondary | ICD-10-CM | POA: Insufficient documentation

## 2013-09-26 DIAGNOSIS — Z90710 Acquired absence of both cervix and uterus: Secondary | ICD-10-CM | POA: Insufficient documentation

## 2013-09-26 DIAGNOSIS — J4489 Other specified chronic obstructive pulmonary disease: Secondary | ICD-10-CM | POA: Insufficient documentation

## 2013-09-26 DIAGNOSIS — Z8619 Personal history of other infectious and parasitic diseases: Secondary | ICD-10-CM | POA: Insufficient documentation

## 2013-09-26 DIAGNOSIS — G8929 Other chronic pain: Secondary | ICD-10-CM | POA: Diagnosis not present

## 2013-09-26 DIAGNOSIS — F909 Attention-deficit hyperactivity disorder, unspecified type: Secondary | ICD-10-CM | POA: Diagnosis not present

## 2013-09-26 DIAGNOSIS — Z8711 Personal history of peptic ulcer disease: Secondary | ICD-10-CM | POA: Insufficient documentation

## 2013-09-26 DIAGNOSIS — F4310 Post-traumatic stress disorder, unspecified: Secondary | ICD-10-CM | POA: Insufficient documentation

## 2013-09-26 DIAGNOSIS — F17200 Nicotine dependence, unspecified, uncomplicated: Secondary | ICD-10-CM | POA: Diagnosis not present

## 2013-09-26 DIAGNOSIS — G43909 Migraine, unspecified, not intractable, without status migrainosus: Secondary | ICD-10-CM | POA: Insufficient documentation

## 2013-09-26 DIAGNOSIS — R1011 Right upper quadrant pain: Secondary | ICD-10-CM | POA: Diagnosis present

## 2013-09-26 LAB — LIPASE, BLOOD: Lipase: 19 U/L (ref 11–59)

## 2013-09-26 LAB — CBC WITH DIFFERENTIAL/PLATELET
Eosinophils Absolute: 0.2 10*3/uL (ref 0.0–0.7)
Eosinophils Relative: 3 % (ref 0–5)
Hemoglobin: 11.9 g/dL — ABNORMAL LOW (ref 12.0–15.0)
Lymphocytes Relative: 27 % (ref 12–46)
Lymphs Abs: 1.6 10*3/uL (ref 0.7–4.0)
MCH: 30.7 pg (ref 26.0–34.0)
Monocytes Absolute: 0.5 10*3/uL (ref 0.1–1.0)
Monocytes Relative: 9 % (ref 3–12)
Neutro Abs: 3.5 10*3/uL (ref 1.7–7.7)
Neutrophils Relative %: 61 % (ref 43–77)
Platelets: 244 10*3/uL (ref 150–400)
RBC: 3.87 MIL/uL (ref 3.87–5.11)
WBC: 5.8 10*3/uL (ref 4.0–10.5)

## 2013-09-26 LAB — COMPREHENSIVE METABOLIC PANEL
AST: 25 U/L (ref 0–37)
BUN: 18 mg/dL (ref 6–23)
CO2: 24 mEq/L (ref 19–32)
Calcium: 9 mg/dL (ref 8.4–10.5)
Chloride: 104 mEq/L (ref 96–112)
Creatinine, Ser: 0.66 mg/dL (ref 0.50–1.10)
GFR calc Af Amer: >90 mL/min (ref >90)
GFR calc non Af Amer: >90 mL/min (ref >90)
Sodium: 138 mEq/L (ref 135–145)
Total Bilirubin: 0.2 mg/dL — ABNORMAL LOW (ref 0.3–1.2)

## 2013-09-26 MED ORDER — PROMETHAZINE HCL 25 MG PO TABS: 25.0000 mg | ORAL_TABLET | Freq: Four times a day (QID) | ORAL | Status: DC | PRN

## 2013-09-26 MED ORDER — SODIUM CHLORIDE 0.9 % IV SOLN
Freq: Once | INTRAVENOUS | Status: AC
  Administered 2013-09-26: 16:00:00 via INTRAVENOUS

## 2013-09-26 MED ORDER — KETOROLAC TROMETHAMINE 30 MG/ML IJ SOLN
INTRAMUSCULAR | Status: AC
  Filled 2013-09-26: qty 1

## 2013-09-26 MED ORDER — FENTANYL CITRATE 0.05 MG/ML IJ SOLN
100.0000 ug | Freq: Once | INTRAMUSCULAR | Status: AC
  Administered 2013-09-26: 100 ug via INTRAVENOUS
  Filled 2013-09-26: qty 2

## 2013-09-26 MED ORDER — HYDROCODONE-ACETAMINOPHEN 5-325 MG PO TABS: 2.0000 | ORAL_TABLET | ORAL | Status: DC | PRN

## 2013-09-26 MED ORDER — ONDANSETRON HCL 4 MG/2ML IJ SOLN
4.0000 mg | Freq: Once | INTRAMUSCULAR | Status: AC
  Administered 2013-09-26: 4 mg via INTRAVENOUS
  Filled 2013-09-26: qty 2

## 2013-09-26 MED ORDER — KETOROLAC TROMETHAMINE 30 MG/ML IJ SOLN
30.0000 mg | Freq: Once | INTRAMUSCULAR | Status: AC
  Administered 2013-09-26: 30 mg via INTRAVENOUS

## 2013-09-26 MED ORDER — SODIUM CHLORIDE 0.9 % IV BOLUS (SEPSIS)
1000.0000 mL | Freq: Once | INTRAVENOUS | Status: AC
  Administered 2013-09-26: 1000 mL via INTRAVENOUS

## 2013-09-26 NOTE — Telephone Encounter (Signed)
Patient called today. Says reflux terrible on Nexium 40 mg twice daily. Nauseated all the time on Phenergan. Having pain under her right breast radiating into her back. Recent gallbladder ultrasound results reviewed. Patient has failed to get her labs as previously recommended. Patient sounds miserable. I advised her the best course of action over the weekend was to visit the emergency department and plan to see Dr. Darrick Penna as soon as can be arranged.

## 2013-09-26 NOTE — ED Notes (Addendum)
RUQ pain starting today with nausea starting today.  Called GI MD and was told to come here.   Reports took phenergan approx 30 min PTA.

## 2013-09-26 NOTE — ED Provider Notes (Signed)
CSN: [id number]     Arrival date & time 09/26/13  1402 History  This chart was scribed for Donnetta Hutching, MD by Bennett Scrape, ED Scribe. This patient was seen in room APA06/APA06 and the patient's care was started at 2:26 PM.   Chief Complaint  Patient presents with  . Abdominal Pain    The history is provided by the patient. No language interpreter was used.    HPI Comments: [name] is a 52 y.o. female who presents to the Emergency Department complaining of intermittent RUQ abdominal pain episodes with associated nausea and forceful emesis that have been occuring since 2011. She states that the most recent episode started today. She denies any diarrhea. She reports that she has f/u with her PCP and a GI for the same in the past and was diagnosed with GERD. She states that she was put on dexalon for acid reflux but was then switched to Nexum and is also taking Tums with no improvement. She was admitted for the same on 08/18/13 to general medicine for symptom control during which she states she had an US done that showed a "stopped up gallbladder duct". She reports that the official diagnosis was constipation and she was started on Colace. She states that she was "cleaned out" of stool with no improvement. She denies any other symptoms.    PCP is Dr. Clent Ridges GI is with [facility].    Past Medical History  Diagnosis Date  . S/P colonoscopy 06/02/09    normal (Dr. Linna Darner)  . PUD (peptic ulcer disease) 01/2007    EGD Dr Jena Gauss, 2 antral ulcers, negative h pylori  . MRSA infection     Dr. Lenice Pressman, currently under treatment  . Hiatal hernia 2008    on EGD above  . Asthma   . PTSD (post-traumatic stress disorder)   . Migraines   . Degenerative disc disease   . Rectal prolapse   . SBO (small bowel obstruction)   . ADHD (attention deficit hyperactivity disorder)   . S/P endoscopy 03/26/11    gastritis-Dr Darrick Penna  . Clostridium difficile colitis 04/10/11 &11/2011    tx w/  flagyl  . Chronic abdominal pain 2003    EX LAP APR 2004 RUPTURE L OV CYST, JUL 2004 ADHESIONS  . Irritable bowel syndrome 2004 CONSTIPATION  . BMI (body mass index) 20.0-29.9 2009 127 LBS  . COPD (chronic obstructive pulmonary disease)   . Chronic nausea   . Anginal pain    Past Surgical History  Procedure Laterality Date  . Bowel resection      x2, secondary to adhesions  . Appendectomy    . Laparoscopic lysis intestinal adhesions    . Back surgery    . Abdominal hysterectomy    . Tubal ligation    . Umbilical hernia repair  2008    x5  . Neck surgery  2009  . Partial hysterectomy    . Upper gastrointestinal endoscopy  APR 2008 [facility] BLEEDING/PAIN    PUD  . Upper gastrointestinal endoscopy  JUL 2012 [facility] PAIN    MILD GASTRITIS  . Esophagogastroduodenoscopy  03/23/2011     Normal esophagus without evidence of Barrett's, mass, erosions, or ulcerations./ Patchy erythema with occasional erosion in the antrum.  Biopsies  obtained via cold forceps to evaluate for H. pylori gastritis/ Small hiatal hernia./Normal duodenal bulb and second portion of the duodenum.  . Cervical spine surgery    . Flexible sigmoidoscopy  07/14/2002    Normal limited  flexible sigmoidoscopy with stool in the rectum and rectosigmoid precluding a full colonoscopy  . Colonoscopy  03/21/2012     [id number] polyp in the transverse colon/Sessile polyp in the rectum/Internal hemorrhoids/. INCOMPLETE DUE TO POOR PREP.PATIENT WAS SUPPOSED TO HAVE REPEAT BUT DID NOT  . Esophagogastroduodenoscopy  03/21/2012    [id number] Hiatal hernia/ABDOMINALPAIN/DIARRHEA MOST LIKELY DUE TO IBS, GASTRITIS DUODENITIS   Family History  Problem Relation Age of Onset  . Adopted: Yes   History  Substance Use Topics  . Smoking status: Current Every Day Smoker -- 0.10 packs/day for 10 years    Types: Cigarettes  . Smokeless tobacco: Former Neurosurgeon  . Alcohol Use: No   OB History   Grav Para Term Preterm Abortions TAB SAB Ect Mult  Living   1 1 1       1      Review of Systems  A complete 10 system review of systems was obtained and all systems are negative except as noted in the HPI and PMH.   Allergies  Penicillins; Sulfa antibiotics; Aspirin; Ibuprofen; Lactaid; and Morphine and related  Home Medications   Current Outpatient Rx  Name  Route  Sig  Dispense  Refill  . albuterol (PROVENTIL,VENTOLIN) 90 MCG/ACT inhaler   Inhalation   Inhale 2 puffs into the lungs every 6 (six) hours as needed. For shortness of breath         . ALPRAZolam (XANAX) 1 MG tablet   Oral   Take 1-2 mg by mouth 3 (three) times daily. Patient takes 1 tablet twice a day and 2 tablets at bedtime         . amphetamine-dextroamphetamine (ADDERALL XR) 15 MG 24 hr capsule   Oral   Take 15 mg by mouth daily.          . cyclobenzaprine (FLEXERIL) 5 MG tablet   Oral   Take 5 mg by mouth every 8 (eight) hours as needed for muscle spasms.         Marland Kitchen dicyclomine (BENTYL) 10 MG capsule   Oral   Take 10 mg by mouth 3 (three) times daily. For abdominal cramps only.         . docusate sodium 100 MG CAPS   Oral   Take 100 mg by mouth 2 (two) times daily.         . DULoxetine (CYMBALTA) 60 MG capsule   Oral   Take 120 mg by mouth every morning.          Marland Kitchen esomeprazole (NEXIUM) 40 MG capsule   Oral   Take 1 capsule (40 mg total) by mouth daily before breakfast.   30 capsule   11   . gabapentin (NEURONTIN) 300 MG capsule   Oral   Take 300-900 mg by mouth 2 (two) times daily. 1 capsule in the morning and 3 capsules at bedtime         . HYDROcodone-acetaminophen (NORCO) 10-325 MG per tablet   Oral   Take 1 tablet by mouth every 4 (four) hours as needed.          . lidocaine (LIDODERM) 5 %   Transdermal   Place 1 patch onto the skin daily as needed. Uses for pain in back. Not used regularly.         . montelukast (SINGULAIR) 10 MG tablet   Oral   Take 10 mg by mouth daily.          . Multiple Vitamin  (MULTIVITAMIN WITH MINERALS)  TABS   Oral   Take 1 tablet by mouth daily.         . nicotine (NICODERM CQ - DOSED IN MG/24 HOURS) 21 mg/24hr patch   Transdermal   Place 1 patch onto the skin daily.   28 patch   0   . polyethylene glycol (MIRALAX / GLYCOLAX) packet   Oral   Take 17 g by mouth daily. MWF, once daily.         . promethazine (PHENERGAN) 25 MG suppository   Rectal   Place 1 suppository (25 mg total) rectally every 6 (six) hours as needed for nausea or vomiting.   12 each   0   . promethazine (PHENERGAN) 25 MG tablet   Oral   Take 1 tablet (25 mg total) by mouth every 6 (six) hours as needed for nausea or vomiting.   20 tablet   0   . topiramate (TOPAMAX) 25 MG tablet   Oral   Take 25 mg by mouth 3 (three) times daily.         Marland Kitchen zolpidem (AMBIEN CR) 12.5 MG CR tablet   Oral   Take 12.5 mg by mouth at bedtime as needed. For sleep          Triage Vitals: BP 117/62  Pulse 80  Temp(Src) 98.2 F (36.8 C)  Resp 20  Ht 5\' 5"  (1.651 m)  Wt 138 lb (62.596 kg)  BMI 22.96 kg/m2  SpO2 99%  Physical Exam  Nursing note and vitals reviewed. Constitutional: She is oriented to person, place, and time. She appears well-developed and well-nourished.  HENT:  Head: Normocephalic and atraumatic.  Eyes: Conjunctivae and EOM are normal. Pupils are equal, round, and reactive to light.  Neck: Normal range of motion. Neck supple.  Cardiovascular: Normal rate, regular rhythm and normal heart sounds.   Pulmonary/Chest: Effort normal and breath sounds normal.  Abdominal: Soft. Bowel sounds are normal. There is tenderness (mild RUQ tenderness ).  Musculoskeletal: Normal range of motion.  Neurological: She is alert and oriented to person, place, and time.  Skin: Skin is warm and dry.  Psychiatric: She has a normal mood and affect. Her behavior is normal.    ED Course  Procedures (including critical care time)  Medications  sodium chloride 0.9 % bolus 1,000 mL (not  administered)  fentaNYL (SUBLIMAZE) injection 100 mcg (not administered)  ondansetron (ZOFRAN) injection 4 mg (not administered)   DIAGNOSTIC STUDIES: Oxygen Saturation is 99% on RA, normal by my interpretation.    COORDINATION OF CARE: 2:30 PM-Advised pt that her symptoms are chronic and that she may not get relief or answers today. Discussed treatment plan which includes medications, CBC panel and CMP with pt at bedside and pt agreed to plan.   Labs Review Labs Reviewed  COMPREHENSIVE METABOLIC PANEL - Abnormal; Notable for the following:    Glucose, Bld 123 (*)    Total Bilirubin 0.2 (*)    All other components within normal limits  CBC WITH DIFFERENTIAL - Abnormal; Notable for the following:    Hemoglobin 11.9 (*)    All other components within normal limits  LIPASE, BLOOD   Imaging Review No results found.  EKG Interpretation   None       MDM  No diagnosis found. No acute abdomen. Patient feels better after IV fluids, antiemetics, pain medication. Discharge medications Norco, Phenergan 25 mg  I personally performed the services described in this documentation, which was scribed in my presence. The  recorded information has been reviewed and is accurate.    Donnetta Hutching, MD 09/26/13 [phone]

## 2013-09-28 ENCOUNTER — Other Ambulatory Visit: Payer: Self-pay | Admitting: Gastroenterology

## 2013-09-28 ENCOUNTER — Encounter: Payer: Self-pay | Admitting: Gastroenterology

## 2013-09-28 DIAGNOSIS — K838 Other specified diseases of biliary tract: Secondary | ICD-10-CM

## 2013-09-28 DIAGNOSIS — G8929 Other chronic pain: Secondary | ICD-10-CM

## 2013-09-28 NOTE — Telephone Encounter (Signed)
Patient has upcoming appt with [facility] already scheduled. Reviewed ER visit. hgb slightly low at 11.9. LFTs and lipase were normal.  Go ahead and schedule MRCP for dilated bile duct and ruq pain to be complete. Creatinine 0.66 on 09/26/13

## 2013-09-28 NOTE — Telephone Encounter (Signed)
MRCP is scheduled for Wednesday Dec 31st at 8:00 am NPO after midnight and she is aware

## 2013-09-28 NOTE — Telephone Encounter (Signed)
Pt is aware of OV on 1/21 at 0930 with SF and appt card was mailed

## 2013-09-28 NOTE — Telephone Encounter (Signed)
Called and informed pt. Routing to Soledad Gerlach to schedule MRCP.

## 2013-09-29 NOTE — Telephone Encounter (Signed)
REVIEWED.  

## 2013-10-06 ENCOUNTER — Other Ambulatory Visit: Payer: Self-pay

## 2013-10-06 MED ORDER — ESOMEPRAZOLE MAGNESIUM 40 MG PO CPDR: 40.0000 mg | DELAYED_RELEASE_CAPSULE | Freq: Two times a day (BID) | ORAL | Status: DC

## 2013-10-07 ENCOUNTER — Ambulatory Visit (HOSPITAL_COMMUNITY): Payer: PRIVATE HEALTH INSURANCE

## 2013-10-13 ENCOUNTER — Ambulatory Visit (HOSPITAL_COMMUNITY): Payer: PRIVATE HEALTH INSURANCE

## 2013-10-19 ENCOUNTER — Ambulatory Visit (HOSPITAL_COMMUNITY): Admission: RE | Admit: 2013-10-19 | Payer: PRIVATE HEALTH INSURANCE | Source: Ambulatory Visit

## 2013-10-28 ENCOUNTER — Ambulatory Visit: Payer: PRIVATE HEALTH INSURANCE | Admitting: Gastroenterology

## 2013-10-28 NOTE — Progress Notes (Signed)
REVIEWED. Agree needs TCS JAN 2015.

## 2013-10-29 ENCOUNTER — Ambulatory Visit: Payer: PRIVATE HEALTH INSURANCE | Admitting: Gastroenterology

## 2013-10-30 ENCOUNTER — Ambulatory Visit: Payer: PRIVATE HEALTH INSURANCE | Admitting: Gastroenterology

## 2013-11-02 ENCOUNTER — Ambulatory Visit: Payer: PRIVATE HEALTH INSURANCE | Admitting: Gastroenterology

## 2013-11-02 ENCOUNTER — Other Ambulatory Visit: Payer: Self-pay | Admitting: Gastroenterology

## 2013-11-03 ENCOUNTER — Other Ambulatory Visit: Payer: Self-pay | Admitting: General Practice

## 2013-11-03 DIAGNOSIS — Z860100 Personal history of colon polyps, unspecified: Secondary | ICD-10-CM

## 2013-11-03 MED ORDER — PEG 3350-KCL-NA BICARB-NACL 420 G PO SOLR: 4000.0000 mL | ORAL | Status: DC

## 2013-11-04 ENCOUNTER — Ambulatory Visit: Payer: PRIVATE HEALTH INSURANCE | Admitting: Gastroenterology

## 2013-11-04 ENCOUNTER — Encounter: Payer: Self-pay | Admitting: General Practice

## 2013-11-05 ENCOUNTER — Encounter (HOSPITAL_COMMUNITY): Payer: Self-pay

## 2013-11-06 ENCOUNTER — Other Ambulatory Visit: Payer: Self-pay | Admitting: Gastroenterology

## 2013-11-16 ENCOUNTER — Other Ambulatory Visit: Payer: Self-pay | Admitting: Gastroenterology

## 2013-11-16 MED ORDER — PEG 3350-KCL-NA BICARB-NACL 420 G PO SOLR: 4000.0000 mL | ORAL | Status: DC

## 2013-11-17 ENCOUNTER — Ambulatory Visit (HOSPITAL_COMMUNITY)
Admission: RE | Admit: 2013-11-17 | Discharge: 2013-11-17 | Disposition: A | Discharge status: Home / Self Care | Payer: PRIVATE HEALTH INSURANCE | Source: Ambulatory Visit | Attending: Internal Medicine | Admitting: Internal Medicine

## 2013-11-17 ENCOUNTER — Telehealth: Payer: Self-pay | Admitting: Gastroenterology

## 2013-11-17 ENCOUNTER — Encounter (HOSPITAL_COMMUNITY): Payer: Self-pay | Admitting: *Deleted

## 2013-11-17 ENCOUNTER — Encounter (HOSPITAL_COMMUNITY)
Admission: RE | Disposition: A | Discharge status: Home / Self Care | Payer: Self-pay | Source: Ambulatory Visit | Attending: Internal Medicine

## 2013-11-17 DIAGNOSIS — Z860100 Personal history of colon polyps, unspecified: Secondary | ICD-10-CM | POA: Insufficient documentation

## 2013-11-17 DIAGNOSIS — R198 Other specified symptoms and signs involving the digestive system and abdomen: Secondary | ICD-10-CM | POA: Insufficient documentation

## 2013-11-17 DIAGNOSIS — J4489 Other specified chronic obstructive pulmonary disease: Secondary | ICD-10-CM | POA: Insufficient documentation

## 2013-11-17 DIAGNOSIS — K648 Other hemorrhoids: Secondary | ICD-10-CM | POA: Insufficient documentation

## 2013-11-17 DIAGNOSIS — J449 Chronic obstructive pulmonary disease, unspecified: Secondary | ICD-10-CM | POA: Insufficient documentation

## 2013-11-17 HISTORY — PX: COLONOSCOPY: SHX5424

## 2013-11-17 SURGERY — COLONOSCOPY
Anesthesia: Moderate Sedation

## 2013-11-17 MED ORDER — MIDAZOLAM HCL 5 MG/5ML IJ SOLN
INTRAMUSCULAR | Status: AC
  Filled 2013-11-17: qty 10

## 2013-11-17 MED ORDER — MIDAZOLAM HCL 5 MG/5ML IJ SOLN
INTRAMUSCULAR | Status: DC | PRN
  Administered 2013-11-17: 1 mg via INTRAVENOUS
  Administered 2013-11-17 (×2): 2 mg via INTRAVENOUS

## 2013-11-17 MED ORDER — MEPERIDINE HCL 100 MG/ML IJ SOLN
INTRAMUSCULAR | Status: DC | PRN
  Administered 2013-11-17 (×2): 50 mg via INTRAVENOUS

## 2013-11-17 MED ORDER — PROMETHAZINE HCL 25 MG/ML IJ SOLN
25.0000 mg | Freq: Once | INTRAMUSCULAR | Status: AC
  Administered 2013-11-17: 25 mg via INTRAVENOUS

## 2013-11-17 MED ORDER — SODIUM CHLORIDE 0.9 % IV SOLN
INTRAVENOUS | Status: DC
  Administered 2013-11-17: 12:00:00 via INTRAVENOUS

## 2013-11-17 MED ORDER — PROMETHAZINE HCL 25 MG/ML IJ SOLN
INTRAMUSCULAR | Status: AC
  Filled 2013-11-17: qty 1

## 2013-11-17 MED ORDER — MEPERIDINE HCL 100 MG/ML IJ SOLN
INTRAMUSCULAR | Status: AC
  Filled 2013-11-17: qty 2

## 2013-11-17 MED ORDER — SODIUM CHLORIDE 0.9 % IJ SOLN
INTRAMUSCULAR | Status: AC
  Filled 2013-11-17: qty 10

## 2013-11-17 MED ORDER — STERILE WATER FOR IRRIGATION IR SOLN
Status: DC | PRN
  Administered 2013-11-17: 12:00:00

## 2013-11-17 NOTE — H&P (Signed)
Primary Care Physician:  Myrtis Ser, MD Primary Gastroenterologist:  [name]  Pre-Procedure History & Physical: HPI:  [name] is a 53 y.o. female here for CHANGE IN BOWEL HABITS.  Past Medical History  Diagnosis Date  . S/P colonoscopy 06/02/09    normal ([name])  . PUD (peptic ulcer disease) 01/2007    EGD Dr [name], 2 antral ulcers, negative h pylori  . MRSA infection     Dr. [name], currently under treatment  . Hiatal hernia 2008    on EGD above  . Asthma   . PTSD (post-traumatic stress disorder)   . Migraines   . Degenerative disc disease   . Rectal prolapse   . SBO (small bowel obstruction)   . ADHD (attention deficit hyperactivity disorder)   . S/P endoscopy 03/26/11    gastritis-Dr [name]  . Clostridium difficile colitis 04/10/11 &11/2011    tx w/ flagyl  . Chronic abdominal pain 2003    EX LAP APR 2004 RUPTURE L OV CYST, JUL 2004 ADHESIONS  . Irritable bowel syndrome 2004 CONSTIPATION  . BMI (body mass index) 20.0-29.9 2009 127 LBS  . COPD (chronic obstructive pulmonary disease)   . Chronic nausea   . Anginal pain     Past Surgical History  Procedure Laterality Date  . Bowel resection      x2, secondary to adhesions  . Appendectomy    . Laparoscopic lysis intestinal adhesions    . Back surgery    . Abdominal hysterectomy    . Tubal ligation    . Umbilical hernia repair  2008    x5  . Neck surgery  2009  . Partial hysterectomy    . Upper gastrointestinal endoscopy  APR 2008 [facility] BLEEDING/PAIN    PUD  . Upper gastrointestinal endoscopy  JUL 2012 [facility] PAIN    MILD GASTRITIS  . Esophagogastroduodenoscopy  03/23/2011     Normal esophagus without evidence of Barrett's, mass, erosions, or ulcerations./ Patchy erythema with occasional erosion in the antrum.  Biopsies  obtained via cold forceps to evaluate for H. pylori gastritis/ Small hiatal hernia./Normal duodenal bulb and second portion of the duodenum.  . Cervical spine surgery    .  Flexible sigmoidoscopy  07/14/2002    Normal limited flexible sigmoidoscopy with stool in the rectum and rectosigmoid precluding a full colonoscopy  . Colonoscopy  03/21/2012     [id number] polyp in the transverse colon/Sessile polyp in the rectum/Internal hemorrhoids/. INCOMPLETE DUE TO POOR PREP.PATIENT WAS SUPPOSED TO HAVE REPEAT BUT DID NOT  . Esophagogastroduodenoscopy  03/21/2012    [id number] Hiatal hernia/ABDOMINALPAIN/DIARRHEA MOST LIKELY DUE TO IBS, GASTRITIS DUODENITIS    Prior to Admission medications   Medication Sig Start Date End Date Taking? Authorizing Provider  albuterol (PROVENTIL,VENTOLIN) 90 MCG/ACT inhaler Inhale 2 puffs into the lungs every 6 (six) hours as needed. For shortness of breath   Yes Historical Provider, MD  ALPRAZolam Duanne Moron) 1 MG tablet Take 1-2 mg by mouth See admin instructions. Patient takes 1 tablet twice a day and 2 tablets at bedtime   Yes Historical Provider, MD  amphetamine-dextroamphetamine (ADDERALL XR) 15 MG 24 hr capsule Take 15 mg by mouth daily.    Yes Historical Provider, MD  BUTRANS 20 MCG/HR PTWK patch Place 1 patch onto the skin once a week. 10/07/13  Yes Historical Provider, MD  cyclobenzaprine (FLEXERIL) 5 MG tablet Take 5 mg by mouth every 8 (eight) hours as needed for muscle spasms.   Yes Historical Provider, MD  dicyclomine (BENTYL) 10 MG capsule Take 10 mg by mouth 3 (three) times daily. For abdominal cramps only.   Yes Historical Provider, MD  docusate sodium 100 MG CAPS Take 100 mg by mouth 2 (two) times daily. 08/22/13  Yes [name], MD  DULoxetine (CYMBALTA) 60 MG capsule Take 120 mg by mouth every morning.    Yes Historical Provider, MD  esomeprazole (NEXIUM) 40 MG capsule Take 1 capsule (40 mg total) by mouth 2 (two) times daily before a meal. 10/06/13  Yes Orvil Feil, NP  gabapentin (NEURONTIN) 300 MG capsule Take 300-900 mg by mouth See admin instructions. 1 capsule in the morning and 3 capsules at bedtime   Yes  Historical Provider, MD  HYDROcodone-acetaminophen (NORCO) 10-325 MG per tablet Take 1 tablet by mouth every 4 (four) hours as needed. pain 09/02/13  Yes Historical Provider, MD  lidocaine (LIDODERM) 5 % Place 1 patch onto the skin daily as needed. Uses for pain in back. Not used regularly.   Yes Historical Provider, MD  mirtazapine (REMERON) 15 MG tablet Take 1 tablet by mouth at bedtime. 08/17/13  Yes Historical Provider, MD  montelukast (SINGULAIR) 10 MG tablet Take 10 mg by mouth daily.  01/14/12  Yes Historical Provider, MD  Multiple Vitamin (MULTIVITAMIN WITH MINERALS) TABS Take 1 tablet by mouth daily.   Yes Historical Provider, MD  PHENADOZ 25 MG suppository UNWRAP AND INSERT 1 SUPPOSITORY RECTALLY EVERY 6 HOURS AS NEEDED FOR NAUSEA AND VOMITING. 11/02/13  Yes Orvil Feil, NP  polyethylene glycol (MIRALAX / GLYCOLAX) packet Take 17 g by mouth daily as needed for mild constipation.  08/22/13  Yes [name], MD  polyethylene glycol-electrolytes (TRILYTE) 420 G solution Take 4,000 mLs by mouth as directed. 11/16/13  Yes Danie Binder, MD  topiramate (TOPAMAX) 25 MG tablet Take 25 mg by mouth 3 (three) times daily.   Yes Historical Provider, MD  zolpidem (AMBIEN CR) 12.5 MG CR tablet Take 12.5 mg by mouth at bedtime as needed. For sleep 03/11/12  Yes Historical Provider, MD    Allergies as of 11/03/2013 - Review Complete 09/26/2013  Allergen Reaction Noted  . Penicillins Anaphylaxis   . Sulfa antibiotics Itching 05/24/2011  . Aspirin Other (See Comments)   . Ibuprofen Nausea Only 10/08/2011  . Lactaid [lactase] Diarrhea and Other (See Comments) 02/06/2012  . Morphine and related Itching 07/15/2011    Family History  Problem Relation Age of Onset  . Adopted: Yes    History   Social History  . Marital Status: Divorced    Spouse Name: N/A    Number of Children: 1  . Years of Education: N/A   Occupational History  . disabled    Social History Main Topics  . Smoking status:  Current Every Day Smoker -- 0.10 packs/day for 10 years    Types: Cigarettes  . Smokeless tobacco: Former Systems developer  . Alcohol Use: No  . Drug Use: No  . Sexual Activity: No   Other Topics Concern  . Not on file   Social History Narrative  . No narrative on file    Review of Systems: See HPI, otherwise negative ROS   Physical Exam: BP 132/68  Pulse 83  Temp(Src) 98.2 F (36.8 C) (Oral)  Resp 18  Ht 5\' 5"  (1.651 m)  Wt 138 lb (62.596 kg)  BMI 22.96 kg/m2  SpO2 92% General:   Alert,  pleasant and cooperative in NAD Head:  Normocephalic and atraumatic. Neck:  Supple; Lungs:  Clear throughout to auscultation.    Heart:  Regular rate and rhythm. Abdomen:  Soft, nontender and nondistended. Normal bowel sounds, without guarding, and without rebound.   Neurologic:  Alert and  oriented x4;  grossly normal neurologically.  Impression/Plan:     Change in bowel habits  PLAN:  1. TCS TODAY

## 2013-11-17 NOTE — Discharge Instructions (Signed)
You have SMALL internal hemorrhoids. YOU DID NOT HAVE ANY POLYPS.   TO AVOID CONSTIPATION: 1. FOLLOW A HIGH FIBER DIET. AVOID ITEMS THAT CAUSE BLOATING. SEE INFO BELOW. 2. USE FIBER POWDER OR PILLS ONCE DAILY FOR 3 DAYS THEN TWICE DAILY FOR 3 DAYS THEN THREE TIMES A DAY. AVOID HIGHER DOSES IF IT CAUSES BLOATING & GAS. 3. DRINK ENOUGH WATER TO KEEP URINE LIGHT YELLOW. 4. DO NOT USE BENTYL IT WILL CAUSE CONSTIPATION.  IT IS USED FOR DIARRHEA. 5. COMPLETE YOUR MRCP. 6. NEXT COLONOSCOPY IN 5-10 YEARS.  FOLLOW UP IN 3 MOS.     Colonoscopy Care After Read the instructions outlined below and refer to this sheet in the next week. These discharge instructions provide you with general information on caring for yourself after you leave the hospital. While your treatment has been planned according to the most current medical practices available, unavoidable complications occasionally occur. If you have any problems or questions after discharge, call DR. Araly Kaas, [phone].  ACTIVITY  You may resume your regular activity, but move at a slower pace for the next 24 hours.   Take frequent rest periods for the next 24 hours.   Walking will help get rid of the air and reduce the bloated feeling in your belly (abdomen).   No driving for 24 hours (because of the medicine (anesthesia) used during the test).   You may shower.   Do not sign any important legal documents or operate any machinery for 24 hours (because of the anesthesia used during the test).    NUTRITION  Drink plenty of fluids.   You may resume your normal diet as instructed by your doctor.   Begin with a light meal and progress to your normal diet. Heavy or fried foods are harder to digest and may make you feel sick to your stomach (nauseated).   Avoid alcoholic beverages for 24 hours or as instructed.    MEDICATIONS  You may resume your normal medications.   WHAT YOU CAN EXPECT TODAY  Some feelings of bloating in the  abdomen.   Passage of more gas than usual.   Spotting of blood in your stool or on the toilet paper  .  IF YOU HAD POLYPS REMOVED DURING THE COLONOSCOPY:  Eat a soft diet IF YOU HAVE NAUSEA, BLOATING, ABDOMINAL PAIN, OR VOMITING.    FINDING OUT THE RESULTS OF YOUR TEST Not all test results are available during your visit. [name] WILL CALL YOU WITHIN 7 DAYS OF YOUR PROCEDUE WITH YOUR RESULTS. Do not assume everything is normal if you have not heard from DR. Tracye Szuch IN ONE WEEK, CALL HER OFFICE AT [phone].  SEEK IMMEDIATE MEDICAL ATTENTION AND CALL THE OFFICE: [phone] IF:  You have more than a spotting of blood in your stool.   Your belly is swollen (abdominal distention).   You are nauseated or vomiting.   You have a temperature over 101F.   You have abdominal pain or discomfort that is severe or gets worse throughout the day.  Constipation in Adults Constipation is having fewer than 2 bowel movements per week. Usually, the stools are hard. As we grow older, constipation is more common. If you try to fix constipation with laxatives, the problem may get worse. This is because laxatives taken over a long period of time make the colon muscles weaker. A low-fiber diet, not taking in enough fluids, HAVING VAGINAL DELIVERIES, and taking some medicines may make these problems worse.  HOME CARE INSTRUCTIONS  Constipation is usually best cared for without medicines. Increasing dietary fiber and eating more fruits and vegetables is the best way to manage constipation.   Slowly increase fiber intake to 25 to 38 grams per day. Whole grains, fruits, vegetables, and legumes are good sources of fiber. A dietitian can further help you incorporate high-fiber foods into your diet.   Drink enough water and fluids to keep your urine clear or pale yellow.   A fiber supplement may be added to your diet if you cannot get enough fiber from foods.   Increasing your activities also helps  improve regularity.   High-Fiber Diet A high-fiber diet changes your normal diet to include more whole grains, legumes, fruits, and vegetables. Changes in the diet involve replacing refined carbohydrates with unrefined foods. The calorie level of the diet is essentially unchanged. The Dietary Reference Intake (recommended amount) for adult males is 38 grams per day. For adult females, it is 25 grams per day. Pregnant and lactating women should consume 28 grams of fiber per day.    Fiber is the intact part of a plant that is not broken down during digestion. Functional fiber is fiber that has been isolated from the plant to provide a beneficial effect in the body.  PURPOSE  Increase stool bulk.   Ease and regulate bowel movements.   Lower cholesterol.    INDICATIONS THAT YOU NEED MORE FIBER  Constipation and hemorrhoids.   Uncomplicated diverticulosis (intestine condition) and irritable bowel syndrome.   Weight management.   As a protective measure against hardening of the arteries (atherosclerosis), diabetes, and cancer.   GUIDELINES FOR INCREASING FIBER IN THE DIET  Start adding fiber to the diet slowly. A gradual increase of about 5 more grams (2 slices of whole-wheat bread, 2 servings of most fruits or vegetables, or 1 bowl of high-fiber cereal) per day is best. Too rapid an increase in fiber may result in constipation, flatulence, and bloating.   Drink enough water and fluids to keep your urine clear or pale yellow. Water, juice, or caffeine-free drinks are recommended. Not drinking enough fluid may cause constipation.   Eat a variety of high-fiber foods rather than one type of fiber.   Try to increase your intake of fiber through using high-fiber foods rather than fiber pills or supplements that contain small amounts of fiber.   The goal is to change the types of food eaten. Do not supplement your present diet with high-fiber foods, but replace foods in your present diet.    INCLUDE A VARIETY OF FIBER SOURCES  Replace refined and processed grains with whole grains, canned fruits with fresh fruits, and incorporate other fiber sources. White rice, white breads, and most bakery goods contain little or no fiber.   Brown whole-grain rice, buckwheat oats, and many fruits and vegetables are all good sources of fiber. These include: broccoli, Brussels sprouts, cabbage, cauliflower, beets, sweet potatoes, white potatoes (skin on), carrots, tomatoes, eggplant, squash, berries, fresh fruits, and dried fruits.   Cereals appear to be the richest source of fiber. Cereal fiber is found in whole grains and bran. Bran is the fiber-rich outer coat of cereal grain, which is largely removed in refining. In whole-grain cereals, the bran remains. In breakfast cereals, the largest amount of fiber is found in those with "bran" in their names. The fiber content is sometimes indicated on the label.      You may need to include additional fruits and vegetables each day.   In  baking, for 1 cup white flour, you may use the following substitutions:   1 cup whole-wheat flour minus 2 tablespoons.   1/2 cup white flour plus 1/2 cup whole-wheat flour.   Hemorrhoids Hemorrhoids are dilated (enlarged) veins around the rectum. Sometimes clots will form in the veins. This makes them swollen and painful. These are called thrombosed hemorrhoids. Causes of hemorrhoids include:  Constipation.   Straining to have a bowel movement.   HEAVY LIFTING HOME CARE INSTRUCTIONS  Eat a well balanced diet and drink 6 to 8 glasses of water every day to avoid constipation. You may also use a bulk laxative.   Avoid straining to have bowel movements.   Keep anal area dry and clean.   Do not use a donut shaped pillow or sit on the toilet for long periods. This increases blood pooling and pain.   Move your bowels when your body has the urge; this will require less straining and will decrease pain and  pressure.

## 2013-11-17 NOTE — Op Note (Signed)
[facility] [address] [place], [address]   COLONOSCOPY PROCEDURE REPORT  PATIENT: Chelsea, Lewis  MR#: [id number] BIRTHDATE: [birth year] , 67  yrs. old GENDER: Female ENDOSCOPIST: Barney Drain, MD REFERRED HD:[id number] Volanda Napoleon, M.D. PROCEDURE DATE:  11/17/2013 PROCEDURE:   Colonoscopy, screening INDICATIONS:High risk patient with personal history of colonic polyp: SIMPLE ADENOMA(JUN 2013, POOR PREP). PT HAS CHANGE IN BOWEL HABITS. HAD DIARRHEA. NOW HAS CONSTIPATION. MEDICATIONS: Promethazine (Phenergan) 25mg  IV, Demerol 100 mg IV, and Versed 5 mg IV  DESCRIPTION OF PROCEDURE:    Physical exam was performed.  Informed consent was obtained from the patient after explaining the benefits, risks, and alternatives to procedure.  The patient was connected to monitor and placed in left lateral position. Continuous oxygen was provided by nasal cannula and IV medicine administered through an indwelling cannula.  After administration of sedation and rectal exam, the patients rectum was intubated and the EC-[id number] ([id number])  colonoscope was advanced under direct visualization to the ileum.  The scope was removed slowly by carefully examining the color, texture, anatomy, and integrity mucosa on the way out.  The patient was recovered in endoscopy and discharged home in satisfactory condition.    COLON FINDINGS: The mucosa appeared normal in the terminal ileum.  , There was evidence of a normal appearing prior surgical anastomosis.  , The colon mucosa was otherwise normal.  , and Small internal hemorrhoids were found.  PREP QUALITY: good.  CECAL W/D TIME: 11 minutes     COMPLICATIONS: None  ENDOSCOPIC IMPRESSION: 1.   Normal mucosa in the terminal ileum 2.   NORMAL surgical anastomosis 3.   Small internal hemorrhoids   RECOMMENDATIONS: 1.  FOLLOW A HIGH FIBER DIET.  AVOID ITEMS THAT CAUSE BLOATING. 2.  USE FIBER POWDER OR PILLS ONCE DAILY FOR 3 DAYS THEN TWICE  DAILY FOR 3 DAYS THEN THREE TIMES A DAY.  AVOID HIGHER DOSES IF IT CAUSES BLOATING & GAS. 3.  DRINK ENOUGH WATER TO KEEP URINE LIGHT YELLOW. 4.  DO NOT USE BENTYL IT WILL CAUSE CONSTIPATION.  IT IS USED FOR DIARRHEA. 5.  COMPLETE MRCP. 6.  NEXT COLONOSCOPY IN 5-10 YEARS.  FOLLOW UP IN 3 MOS.       _______________________________ Lorrin MaisBarney Drain, MD 11/17/2013 12:46 PM     PATIENT NAME:  Chelsea, Lewis MR#: [id number]

## 2013-11-17 NOTE — Telephone Encounter (Signed)
OPEN IN ERROR 

## 2013-11-20 ENCOUNTER — Encounter (HOSPITAL_COMMUNITY): Payer: Self-pay | Admitting: Gastroenterology

## 2013-12-09 ENCOUNTER — Telehealth: Payer: Self-pay

## 2013-12-09 ENCOUNTER — Emergency Department (HOSPITAL_COMMUNITY)
Admission: EM | Admit: 2013-12-09 | Discharge: 2013-12-09 | Disposition: A | Discharge status: Home / Self Care | Payer: PRIVATE HEALTH INSURANCE | Attending: Emergency Medicine | Admitting: Emergency Medicine

## 2013-12-09 ENCOUNTER — Encounter (HOSPITAL_COMMUNITY): Payer: Self-pay | Admitting: Emergency Medicine

## 2013-12-09 DIAGNOSIS — G43909 Migraine, unspecified, not intractable, without status migrainosus: Secondary | ICD-10-CM | POA: Insufficient documentation

## 2013-12-09 DIAGNOSIS — Z8711 Personal history of peptic ulcer disease: Secondary | ICD-10-CM | POA: Insufficient documentation

## 2013-12-09 DIAGNOSIS — Z8719 Personal history of other diseases of the digestive system: Secondary | ICD-10-CM | POA: Insufficient documentation

## 2013-12-09 DIAGNOSIS — R519 Headache, unspecified: Secondary | ICD-10-CM

## 2013-12-09 DIAGNOSIS — Z880 Allergy status to penicillin: Secondary | ICD-10-CM | POA: Insufficient documentation

## 2013-12-09 DIAGNOSIS — Z79899 Other long term (current) drug therapy: Secondary | ICD-10-CM | POA: Insufficient documentation

## 2013-12-09 DIAGNOSIS — F4310 Post-traumatic stress disorder, unspecified: Secondary | ICD-10-CM | POA: Insufficient documentation

## 2013-12-09 DIAGNOSIS — IMO0002 Reserved for concepts with insufficient information to code with codable children: Secondary | ICD-10-CM | POA: Insufficient documentation

## 2013-12-09 DIAGNOSIS — Z8614 Personal history of Methicillin resistant Staphylococcus aureus infection: Secondary | ICD-10-CM | POA: Insufficient documentation

## 2013-12-09 DIAGNOSIS — F909 Attention-deficit hyperactivity disorder, unspecified type: Secondary | ICD-10-CM | POA: Insufficient documentation

## 2013-12-09 DIAGNOSIS — J449 Chronic obstructive pulmonary disease, unspecified: Secondary | ICD-10-CM | POA: Insufficient documentation

## 2013-12-09 DIAGNOSIS — F17200 Nicotine dependence, unspecified, uncomplicated: Secondary | ICD-10-CM | POA: Insufficient documentation

## 2013-12-09 DIAGNOSIS — R51 Headache: Secondary | ICD-10-CM

## 2013-12-09 DIAGNOSIS — J4489 Other specified chronic obstructive pulmonary disease: Secondary | ICD-10-CM | POA: Insufficient documentation

## 2013-12-09 MED ORDER — HYDROMORPHONE HCL PF 1 MG/ML IJ SOLN
1.0000 mg | Freq: Once | INTRAMUSCULAR | Status: DC
  Filled 2013-12-09: qty 1

## 2013-12-09 MED ORDER — PROMETHAZINE HCL 25 MG RE SUPP
25.0000 mg | Freq: Four times a day (QID) | RECTAL | Status: DC | PRN
  Administered 2013-12-09: 25 mg via RECTAL
  Filled 2013-12-09: qty 1

## 2013-12-09 MED ORDER — PROMETHAZINE HCL 25 MG RE SUPP: 25.0000 mg | Freq: Four times a day (QID) | RECTAL | Status: DC | PRN

## 2013-12-09 MED ORDER — HYDROMORPHONE HCL PF 1 MG/ML IJ SOLN
1.0000 mg | Freq: Once | INTRAMUSCULAR | Status: AC
  Administered 2013-12-09: 1 mg via INTRAMUSCULAR

## 2013-12-09 NOTE — Telephone Encounter (Signed)
Pt called and said she is having a lot of vomiting today, she estimated about 7-8 times this morning. She is having some abdominal pain in the epigastric region all the way across. She rates that pain at an 8 now. She also has a migraine and she thinks that is causing her vomiting.   She said she has only had a few sips of water this AM and has not eaten anything.  She said she has not been taken her morning meds and she feels that has contributed to this problem today,  She said she is going to the ED, she just has to have something for her head and her nausea.  She asked could she get an Rx for nausea, and I told her I was not sure if we could send something in if she is heading to the ED now, but I would forward info to Dr.Fields and Neil Crouch, PA.

## 2013-12-09 NOTE — Telephone Encounter (Signed)
REVIEWED.  

## 2013-12-09 NOTE — ED Provider Notes (Signed)
CSN: [id number]     Arrival date & time 12/09/13  1216 History   First MD Initiated Contact with Patient 12/09/13 1242     Chief Complaint  Patient presents with  . Migraine     (Consider location/radiation/quality/duration/timing/severity/associated sxs/prior Treatment) HPI.... frontal migraine headache for 2 days with associated vomiting. She has run out of her Phenergan suppositories. She has regular migraines. This seems like her normal HA. No fever, chills, stiff neck, neurological deficits. Severity is mild to moderate.  Past Medical History  Diagnosis Date  . S/P colonoscopy 06/02/09    normal ([name])  . PUD (peptic ulcer disease) 01/2007    EGD Dr [name], 2 antral ulcers, negative h pylori  . MRSA infection     Dr. [name], currently under treatment  . Hiatal hernia 2008    on EGD above  . Asthma   . PTSD (post-traumatic stress disorder)   . Migraines   . Degenerative disc disease   . Rectal prolapse   . SBO (small bowel obstruction)   . ADHD (attention deficit hyperactivity disorder)   . S/P endoscopy 03/26/11    gastritis-Dr [name]  . Clostridium difficile colitis 04/10/11 &11/2011    tx w/ flagyl  . Chronic abdominal pain 2003    EX LAP APR 2004 RUPTURE L OV CYST, JUL 2004 ADHESIONS  . Irritable bowel syndrome 2004 CONSTIPATION  . BMI (body mass index) 20.0-29.9 2009 127 LBS  . COPD (chronic obstructive pulmonary disease)   . Chronic nausea   . Anginal pain    Past Surgical History  Procedure Laterality Date  . Bowel resection      x2, secondary to adhesions  . Appendectomy    . Laparoscopic lysis intestinal adhesions    . Back surgery    . Abdominal hysterectomy    . Tubal ligation    . Umbilical hernia repair  2008    x5  . Neck surgery  2009  . Partial hysterectomy    . Upper gastrointestinal endoscopy  APR 2008 [facility] BLEEDING/PAIN    PUD  . Upper gastrointestinal endoscopy  JUL 2012 [facility] PAIN    MILD GASTRITIS  . Esophagogastroduodenoscopy   03/23/2011     Normal esophagus without evidence of Barrett's, mass, erosions, or ulcerations./ Patchy erythema with occasional erosion in the antrum.  Biopsies  obtained via cold forceps to evaluate for H. pylori gastritis/ Small hiatal hernia./Normal duodenal bulb and second portion of the duodenum.  . Cervical spine surgery    . Flexible sigmoidoscopy  07/14/2002    Normal limited flexible sigmoidoscopy with stool in the rectum and rectosigmoid precluding a full colonoscopy  . Colonoscopy  03/21/2012     DPO:EUMPNT ADENOMA(1) POOR PREP  . Esophagogastroduodenoscopy  03/21/2012    IRW:ERXVQ Hiatal hernia/ABDOMINALPAIN/DIARRHEA MOST LIKELY DUE TO IBS, GASTRITIS DUODENITIS  . Colonoscopy N/A 11/17/2013    Procedure: COLONOSCOPY;  Surgeon: Danie Binder, MD;  Location: [facility];  Service: Endoscopy;  Laterality: N/A;  10:40AM-rescheduled to [place] notified pt   Family History  Problem Relation Age of Onset  . Adopted: Yes   History  Substance Use Topics  . Smoking status: Current Every Day Smoker -- 0.10 packs/day for 10 years    Types: Cigarettes  . Smokeless tobacco: Former Systems developer  . Alcohol Use: No   OB History   Grav Para Term Preterm Abortions TAB SAB Ect Mult Living   1 1 1        1  Review of Systems  All other systems reviewed and are negative.      Allergies  Penicillins; Sulfa antibiotics; Aspirin; Ibuprofen; Lactaid; and Morphine and related  Home Medications   Current Outpatient Rx  Name  Route  Sig  Dispense  Refill  . albuterol (PROVENTIL,VENTOLIN) 90 MCG/ACT inhaler   Inhalation   Inhale 2 puffs into the lungs every 6 (six) hours as needed. For shortness of breath         . ALPRAZolam (XANAX) 1 MG tablet   Oral   Take 1-2 mg by mouth See admin instructions. Patient takes 1 tablet twice a day and 2 tablets at bedtime         . amphetamine-dextroamphetamine (ADDERALL XR) 15 MG 24 hr capsule   Oral   Take 15 mg by mouth daily.           Marland Kitchen BUTRANS 20 MCG/HR PTWK patch   Transdermal   Place 1 patch onto the skin once a week.         . cyclobenzaprine (FLEXERIL) 5 MG tablet   Oral   Take 5 mg by mouth every 8 (eight) hours as needed for muscle spasms.         Marland Kitchen docusate sodium 100 MG CAPS   Oral   Take 100 mg by mouth 2 (two) times daily.         . DULoxetine (CYMBALTA) 60 MG capsule   Oral   Take 120 mg by mouth every morning.          Marland Kitchen esomeprazole (NEXIUM) 40 MG capsule   Oral   Take 1 capsule (40 mg total) by mouth 2 (two) times daily before a meal.   60 capsule   3   . gabapentin (NEURONTIN) 300 MG capsule   Oral   Take 300-900 mg by mouth See admin instructions. 1 capsule in the morning and 3 capsules at bedtime         . HYDROcodone-acetaminophen (NORCO) 10-325 MG per tablet   Oral   Take 1 tablet by mouth every 4 (four) hours as needed. pain         . lidocaine (LIDODERM) 5 %   Transdermal   Place 1 patch onto the skin daily as needed. Uses for pain in back. Not used regularly.         . mirtazapine (REMERON) 15 MG tablet   Oral   Take 1 tablet by mouth at bedtime.         . montelukast (SINGULAIR) 10 MG tablet   Oral   Take 10 mg by mouth daily.          . Multiple Vitamin (MULTIVITAMIN WITH MINERALS) TABS   Oral   Take 1 tablet by mouth daily.         . polyethylene glycol (MIRALAX / GLYCOLAX) packet   Oral   Take 17 g by mouth daily as needed for mild constipation.          . topiramate (TOPAMAX) 25 MG tablet   Oral   Take 25 mg by mouth 3 (three) times daily.         . promethazine (PHENERGAN) 25 MG suppository   Rectal   Place 1 suppository (25 mg total) rectally every 6 (six) hours as needed for nausea or vomiting.   20 each   0    BP 104/63  Pulse 99  Temp(Src) 97.9 F (36.6 C) (Oral)  Resp 18  Ht 5'  5" (1.651 m)  Wt 135 lb (61.236 kg)  BMI 22.47 kg/m2  SpO2 97% Physical Exam  Nursing note and vitals reviewed. Constitutional: She is  oriented to person, place, and time.  Sunglasses on  HENT:  Head: Normocephalic and atraumatic.  Eyes: Conjunctivae and EOM are normal. Pupils are equal, round, and reactive to light.  Neck: Normal range of motion. Neck supple.  Cardiovascular: Normal rate, regular rhythm and normal heart sounds.   Pulmonary/Chest: Effort normal and breath sounds normal.  Abdominal: Soft. Bowel sounds are normal.  Musculoskeletal: Normal range of motion.  Neurological: She is alert and oriented to person, place, and time.  Skin: Skin is warm and dry.  Psychiatric: She has a normal mood and affect. Her behavior is normal.    ED Course  Procedures (including critical care time) Labs Review Labs Reviewed - No data to display Imaging Review No results found.   EKG Interpretation None      MDM   Final diagnoses:  Head ache    Presentation appears to be her normal migraine headache. Rx Dilaudid 1 mg IM and Phenergan 25 mg suppository. Discharge medications Phenergan 25 mg suppository    Nat Christen, MD 12/09/13 1425

## 2013-12-09 NOTE — ED Notes (Signed)
Migraine headache for the past 2 days, states she has been vomiting

## 2013-12-09 NOTE — Discharge Instructions (Signed)
Prescription for Phenergan suppositories. Followup your primary care Dr.

## 2014-02-03 ENCOUNTER — Telehealth: Payer: Self-pay

## 2014-02-03 NOTE — Telephone Encounter (Signed)
Patient called to reschedule her appointment but she can not wait until June. She would like to have something nausea called into [place] . She is also having some constipation. Please advise

## 2014-02-08 ENCOUNTER — Ambulatory Visit: Payer: PRIVATE HEALTH INSURANCE | Admitting: Gastroenterology

## 2014-02-08 MED ORDER — PROMETHAZINE HCL 25 MG RE SUPP: RECTAL | Status: DC

## 2014-02-08 NOTE — Telephone Encounter (Signed)
PLEASE CALL PT. HER PHENERGAN RX WAS SENT. TO TREAT HER CONSTIPATION:   DRINK WATER TO KEEP HER URINE LIGHT YELLOW.  SHE SHOULD FOLLOW A HIGH FIBER DIET.   SHE MAY CONSIDER USING FIBER POWDER OR 1 PACKET ONCE DAILY FOR 3 DAYS THEN TWICE DAILY FOR 3 DAYS THEN THREE TIMES A DAY. AVOID HIGHER DOSES IF IT CAUSES BLOATING & GAS.  USE MOM ONCE OR TWICE A DAY. IF THAT FAILS TO IMPROVE HER SX AFTER 1 MOS. THEN SHE SHOULD CALL ME.

## 2014-02-08 NOTE — Telephone Encounter (Signed)
LMOM to call back

## 2014-02-10 ENCOUNTER — Telehealth: Payer: Self-pay | Admitting: Gastroenterology

## 2014-02-10 NOTE — Telephone Encounter (Signed)
I called and gave verbal order ( Dr. Nona Dell order did not go through). I spoke to [place] at [facility].

## 2014-02-10 NOTE — Telephone Encounter (Signed)
ASKING FOR A REFILL AGAIN ON PHENERGAN FOR NAUSEA

## 2014-02-10 NOTE — Telephone Encounter (Signed)
Patient states [facility] does not have Rx

## 2014-03-08 ENCOUNTER — Ambulatory Visit: Payer: PRIVATE HEALTH INSURANCE | Admitting: Gastroenterology

## 2014-04-12 ENCOUNTER — Telehealth: Payer: Self-pay | Admitting: Internal Medicine

## 2014-04-12 ENCOUNTER — Ambulatory Visit: Payer: PRIVATE HEALTH INSURANCE | Admitting: Gastroenterology

## 2014-04-12 NOTE — Telephone Encounter (Signed)
Pt was a no show

## 2014-04-18 ENCOUNTER — Emergency Department (HOSPITAL_COMMUNITY): Payer: PRIVATE HEALTH INSURANCE

## 2014-04-18 ENCOUNTER — Encounter (HOSPITAL_COMMUNITY): Payer: Self-pay | Admitting: Emergency Medicine

## 2014-04-18 ENCOUNTER — Inpatient Hospital Stay (HOSPITAL_COMMUNITY)
Admission: EM | Admit: 2014-04-18 | Discharge: 2014-04-20 | DRG: 390 | Disposition: A | Discharge status: Home / Self Care | Payer: PRIVATE HEALTH INSURANCE | Attending: Family Medicine | Admitting: Family Medicine

## 2014-04-18 DIAGNOSIS — J45909 Unspecified asthma, uncomplicated: Secondary | ICD-10-CM | POA: Diagnosis present

## 2014-04-18 DIAGNOSIS — F17200 Nicotine dependence, unspecified, uncomplicated: Secondary | ICD-10-CM | POA: Diagnosis present

## 2014-04-18 DIAGNOSIS — IMO0002 Reserved for concepts with insufficient information to code with codable children: Secondary | ICD-10-CM

## 2014-04-18 DIAGNOSIS — F909 Attention-deficit hyperactivity disorder, unspecified type: Secondary | ICD-10-CM | POA: Diagnosis present

## 2014-04-18 DIAGNOSIS — E860 Dehydration: Secondary | ICD-10-CM | POA: Diagnosis present

## 2014-04-18 DIAGNOSIS — K567 Ileus, unspecified: Secondary | ICD-10-CM | POA: Diagnosis present

## 2014-04-18 DIAGNOSIS — R1110 Vomiting, unspecified: Secondary | ICD-10-CM | POA: Diagnosis present

## 2014-04-18 DIAGNOSIS — G43909 Migraine, unspecified, not intractable, without status migrainosus: Secondary | ICD-10-CM | POA: Diagnosis present

## 2014-04-18 DIAGNOSIS — R1084 Generalized abdominal pain: Secondary | ICD-10-CM

## 2014-04-18 DIAGNOSIS — F3289 Other specified depressive episodes: Secondary | ICD-10-CM | POA: Diagnosis present

## 2014-04-18 DIAGNOSIS — Z79899 Other long term (current) drug therapy: Secondary | ICD-10-CM | POA: Diagnosis not present

## 2014-04-18 DIAGNOSIS — R109 Unspecified abdominal pain: Secondary | ICD-10-CM | POA: Diagnosis present

## 2014-04-18 DIAGNOSIS — G8929 Other chronic pain: Secondary | ICD-10-CM | POA: Diagnosis present

## 2014-04-18 DIAGNOSIS — K589 Irritable bowel syndrome without diarrhea: Secondary | ICD-10-CM | POA: Diagnosis present

## 2014-04-18 DIAGNOSIS — F4310 Post-traumatic stress disorder, unspecified: Secondary | ICD-10-CM | POA: Diagnosis present

## 2014-04-18 DIAGNOSIS — K560 Paralytic ileus: Principal | ICD-10-CM | POA: Diagnosis present

## 2014-04-18 DIAGNOSIS — F603 Borderline personality disorder: Secondary | ICD-10-CM | POA: Diagnosis present

## 2014-04-18 DIAGNOSIS — K219 Gastro-esophageal reflux disease without esophagitis: Secondary | ICD-10-CM | POA: Diagnosis present

## 2014-04-18 DIAGNOSIS — Z720 Tobacco use: Secondary | ICD-10-CM | POA: Diagnosis present

## 2014-04-18 DIAGNOSIS — R112 Nausea with vomiting, unspecified: Secondary | ICD-10-CM

## 2014-04-18 DIAGNOSIS — F418 Other specified anxiety disorders: Secondary | ICD-10-CM | POA: Diagnosis present

## 2014-04-18 DIAGNOSIS — F329 Major depressive disorder, single episode, unspecified: Secondary | ICD-10-CM | POA: Diagnosis present

## 2014-04-18 LAB — COMPREHENSIVE METABOLIC PANEL
ALBUMIN: 4 g/dL (ref 3.5–5.2)
ALT: 29 U/L (ref 0–35)
ANION GAP: 11 (ref 5–15)
AST: 30 U/L (ref 0–37)
Alkaline Phosphatase: 77 U/L (ref 39–117)
BILIRUBIN TOTAL: 0.2 mg/dL — AB (ref 0.3–1.2)
BUN: 12 mg/dL (ref 6–23)
CHLORIDE: 104 meq/L (ref 96–112)
CO2: 25 mEq/L (ref 19–32)
Calcium: 9.5 mg/dL (ref 8.4–10.5)
Creatinine, Ser: 0.69 mg/dL (ref 0.50–1.10)
GFR calc non Af Amer: >90 mL/min (ref >90)
GLUCOSE: 85 mg/dL (ref 70–99)
POTASSIUM: 4.5 meq/L (ref 3.7–5.3)
SODIUM: 140 meq/L (ref 137–147)
TOTAL PROTEIN: 6.9 g/dL (ref 6.0–8.3)

## 2014-04-18 LAB — URINALYSIS, ROUTINE W REFLEX MICROSCOPIC
BILIRUBIN URINE: NEGATIVE
Glucose, UA: NEGATIVE mg/dL
HGB URINE DIPSTICK: NEGATIVE
KETONES UR: NEGATIVE mg/dL
Leukocytes, UA: NEGATIVE
NITRITE: NEGATIVE
PH: 5.5 (ref 5.0–8.0)
Protein, ur: NEGATIVE mg/dL
Specific Gravity, Urine: 1.015 (ref 1.005–1.030)
Urobilinogen, UA: 0.2 mg/dL (ref 0.0–1.0)

## 2014-04-18 LAB — CBC WITH DIFFERENTIAL/PLATELET
Basophils Absolute: 0 10*3/uL (ref 0.0–0.1)
Basophils Relative: 0 % (ref 0–1)
Eosinophils Absolute: 0.3 10*3/uL (ref 0.0–0.7)
Eosinophils Relative: 2 % (ref 0–5)
HCT: 40.7 % (ref 36.0–46.0)
Hemoglobin: 13.3 g/dL (ref 12.0–15.0)
Lymphocytes Relative: 10 % — ABNORMAL LOW (ref 12–46)
Lymphs Abs: 1.3 10*3/uL (ref 0.7–4.0)
MCH: 30.5 pg (ref 26.0–34.0)
MCHC: 32.7 g/dL (ref 30.0–36.0)
MCV: 93.3 fL (ref 78.0–100.0)
Monocytes Absolute: 0.7 10*3/uL (ref 0.1–1.0)
Monocytes Relative: 5 % (ref 3–12)
NEUTROS PCT: 83 % — AB (ref 43–77)
Neutro Abs: 10.6 10*3/uL — ABNORMAL HIGH (ref 1.7–7.7)
PLATELETS: 273 10*3/uL (ref 150–400)
RBC: 4.36 MIL/uL (ref 3.87–5.11)
RDW: 13.5 % (ref 11.5–15.5)
WBC: 12.8 10*3/uL — AB (ref 4.0–10.5)

## 2014-04-18 MED ORDER — FENTANYL CITRATE 0.05 MG/ML IJ SOLN
50.0000 ug | Freq: Once | INTRAMUSCULAR | Status: AC
  Administered 2014-04-18: 50 ug via INTRAVENOUS
  Filled 2014-04-18: qty 2

## 2014-04-18 MED ORDER — IOHEXOL 300 MG/ML  SOLN
50.0000 mL | Freq: Once | INTRAMUSCULAR | Status: AC | PRN
  Administered 2014-04-18: 50 mL via ORAL

## 2014-04-18 MED ORDER — SODIUM CHLORIDE 0.9 % IV SOLN
1000.0000 mL | Freq: Once | INTRAVENOUS | Status: AC
  Administered 2014-04-18: 1000 mL via INTRAVENOUS

## 2014-04-18 MED ORDER — ONDANSETRON HCL 4 MG/2ML IJ SOLN
4.0000 mg | Freq: Once | INTRAMUSCULAR | Status: AC
  Administered 2014-04-18: 4 mg via INTRAVENOUS
  Filled 2014-04-18: qty 2

## 2014-04-18 MED ORDER — SODIUM CHLORIDE 0.9 % IV SOLN
1000.0000 mL | INTRAVENOUS | Status: DC
  Administered 2014-04-18: 1000 mL via INTRAVENOUS

## 2014-04-18 MED ORDER — NICOTINE 14 MG/24HR TD PT24
14.0000 mg | MEDICATED_PATCH | Freq: Once | TRANSDERMAL | Status: AC
Start: 2014-04-18 — End: 2014-04-19
  Administered 2014-04-18: 14 mg via TRANSDERMAL
  Filled 2014-04-18 (×2): qty 1

## 2014-04-18 NOTE — ED Notes (Signed)
MD at bedside. 

## 2014-04-18 NOTE — ED Notes (Signed)
Patient c/o intermittent lower abd pain. Patient reports nausea, vomiting, and ?diarrhea. Per patient was constipated x1 week-took a laxative last night and that is when pain started. Patient unsure of fevers. Patient states that her stools are mucus and clear. Per patient took colace, phenergan, bentyl, and vicodin with no relief. Patient reports hx of bowel obstruction with bowel resection. Per patient pain is similar.

## 2014-04-18 NOTE — ED Notes (Signed)
Patient requesting nicotine patch. Advised Dr Tomi Bamberger. Verbal order approving nicotine patch.

## 2014-04-18 NOTE — H&P (Signed)
[facility] History and Physical  [name] XVQ:[id number] DOB: [birth year] DOA: 04/18/2014  Referring physician: Dr. Tomi Bamberger PCP: Myrtis Ser, MD   Chief Complaint: abdominal pain  HPI: [name] is a 53 y.o. female presents to the emergency room with complaints of abdominal pain. Patient reports onset of symptoms occurring approximately 24 hours ago. It progressively got worse. She describes pain as crampy type pain, primarily in the left abdomen and worse on the left lower quadrant. She's had associated nausea, had one episode of vomiting after arrival to the emergency room. She does describe small amounts of loose stools but has not had a normal bowel movement in several days. She feels that her abdomen is more distended. She is unsure whether she's had any fevers, she's not been on any recent antibiotics. She's not been eating out lately and has had no sick contacts. Denies any shortness of breath or cough or chest pain. She was evaluated in the emergency room her initial abdominal x-ray indicated possible small bowel obstruction. Further imaging with CT scan of the abdomen and pelvis indicated ileus versus enteritis. She is being admitted for further treatment   Review of Systems:  Pertinent positives as per HPI, otherwise negative.  Past Medical History  Diagnosis Date  . S/P colonoscopy 06/02/09    normal ([name])  . PUD (peptic ulcer disease) 01/2007    EGD Dr [name], 2 antral ulcers, negative h pylori  . MRSA infection     Dr. [name], currently under treatment  . Hiatal hernia 2008    on EGD above  . Asthma   . PTSD (post-traumatic stress disorder)   . Migraines   . Degenerative disc disease   . Rectal prolapse   . SBO (small bowel obstruction)   . ADHD (attention deficit hyperactivity disorder)   . S/P endoscopy 03/26/11    gastritis-Dr [name]  . Clostridium difficile colitis 04/10/11 &11/2011    tx w/ flagyl  . Chronic abdominal pain 2003     EX LAP APR 2004 RUPTURE L OV CYST, JUL 2004 ADHESIONS  . Irritable bowel syndrome 2004 CONSTIPATION  . BMI (body mass index) 20.0-29.9 2009 127 LBS  . COPD (chronic obstructive pulmonary disease)   . Chronic nausea   . Anginal pain    Past Surgical History  Procedure Laterality Date  . Bowel resection      x2, secondary to adhesions  . Appendectomy    . Laparoscopic lysis intestinal adhesions    . Back surgery    . Abdominal hysterectomy    . Tubal ligation    . Umbilical hernia repair  2008    x5  . Neck surgery  2009  . Partial hysterectomy    . Upper gastrointestinal endoscopy  APR 2008 [facility] BLEEDING/PAIN    PUD  . Upper gastrointestinal endoscopy  JUL 2012 [facility] PAIN    MILD GASTRITIS  . Esophagogastroduodenoscopy  03/23/2011     Normal esophagus without evidence of Barrett's, mass, erosions, or ulcerations./ Patchy erythema with occasional erosion in the antrum.  Biopsies  obtained via cold forceps to evaluate for H. pylori gastritis/ Small hiatal hernia./Normal duodenal bulb and second portion of the duodenum.  . Cervical spine surgery    . Flexible sigmoidoscopy  07/14/2002    Normal limited flexible sigmoidoscopy with stool in the rectum and rectosigmoid precluding a full colonoscopy  . Colonoscopy  03/21/2012     KDT:OIZTIW ADENOMA(1) POOR PREP  . Esophagogastroduodenoscopy  03/21/2012    PYK:DXIPJ  Hiatal hernia/ABDOMINALPAIN/DIARRHEA MOST LIKELY DUE TO IBS, GASTRITIS DUODENITIS  . Colonoscopy N/A 11/17/2013    BSJ:GGEZMO mucosa in the terminal ileum/NORMAL surgical anastomosis/Small internal hemorrhoids   Social History:  reports that she has been smoking Cigarettes.  She has a 1 pack-year smoking history. She has quit using smokeless tobacco. She reports that she does not drink alcohol or use illicit drugs.  Allergies  Allergen Reactions  . Penicillins Anaphylaxis    REACTION: anaphylaxis  . Sulfa Antibiotics Itching  . Aspirin Other (See Comments)    Burning of  stomach. REACTION: GI upset  . Ibuprofen Nausea Only    Upset stomach due to acid reflux  . Lactose Intolerance (Gi) Diarrhea  . Morphine And Related Itching    Family History  Problem Relation Age of Onset  . Adopted: Yes     Prior to Admission medications   Medication Sig Start Date End Date Taking? Authorizing Provider  albuterol (PROVENTIL,VENTOLIN) 90 MCG/ACT inhaler Inhale 2 puffs into the lungs every 6 (six) hours as needed. For shortness of breath   Yes Historical Provider, MD  ALPRAZolam Duanne Moron) 1 MG tablet Take 1-2 mg by mouth 3 (three) times daily. Patient takes 1 tablet twice a day and 2 tablets at bedtime   Yes Historical Provider, MD  amphetamine-dextroamphetamine (ADDERALL XR) 15 MG 24 hr capsule Take 15 mg by mouth 2 (two) times daily.    Yes Historical Provider, MD  cyclobenzaprine (FLEXERIL) 5 MG tablet Take 5 mg by mouth every 8 (eight) hours as needed for muscle spasms.   Yes Historical Provider, MD  dicyclomine (BENTYL) 10 MG capsule Take 10 mg by mouth 3 (three) times daily as needed for spasms.   Yes Historical Provider, MD  docusate sodium 100 MG CAPS Take 100 mg by mouth 2 (two) times daily. 08/22/13  Yes [name], MD  DULoxetine (CYMBALTA) 60 MG capsule Take 120 mg by mouth every morning.    Yes Historical Provider, MD  esomeprazole (NEXIUM) 40 MG capsule Take 40 mg by mouth daily. 10/06/13  Yes Orvil Feil, NP  gabapentin (NEURONTIN) 300 MG capsule Take 300-900 mg by mouth 2 (two) times daily. 1 capsule in the morning and 3 capsules at bedtime   Yes Historical Provider, MD  HYDROcodone-acetaminophen (NORCO) 10-325 MG per tablet Take 1 tablet by mouth every 4 (four) hours as needed. pain 09/02/13  Yes Historical Provider, MD  lidocaine (LIDODERM) 5 % Place 1 patch onto the skin daily as needed. Uses for pain in back. Not used regularly.   Yes Historical Provider, MD  mirtazapine (REMERON) 15 MG tablet Take 1 tablet by mouth at bedtime as needed.  08/17/13   Yes Historical Provider, MD  montelukast (SINGULAIR) 10 MG tablet Take 10 mg by mouth daily.  01/14/12  Yes Historical Provider, MD  Multiple Vitamin (MULTIVITAMIN WITH MINERALS) TABS Take 1 tablet by mouth daily.   Yes Historical Provider, MD  promethazine (PHENERGAN) 25 MG suppository Place 25 mg rectally every 6 (six) hours as needed for nausea or vomiting.   Yes Historical Provider, MD  topiramate (TOPAMAX) 25 MG tablet Take 25 mg by mouth 3 (three) times daily.   Yes Historical Provider, MD   Physical Exam: Filed Vitals:   04/18/14 2300  BP: 95/66  Pulse: 64  Temp:   Resp:     BP 95/66  Pulse 64  Temp(Src) 97.8 F (36.6 C) (Oral)  Resp 14  Ht 5\' 5"  (1.651 m)  Wt 62.596 kg (  138 lb)  BMI 22.96 kg/m2  SpO2 91%  General:  Appears calm and comfortable Eyes: PERRL, normal lids, irises & conjunctiva ENT: grossly normal hearing, lips & tongue Neck: no LAD, masses or thyromegaly Cardiovascular: RRR, no m/r/g. No LE edema. Telemetry: SR, no arrhythmias  Respiratory: CTA bilaterally, no w/r/r. Normal respiratory effort. Abdomen: soft, tender on left side, particularly in LLQ, bowel sounds are sluggish Skin: no rash or induration seen on limited exam Musculoskeletal: grossly normal tone BUE/BLE Psychiatric: grossly normal mood and affect, speech fluent and appropriate Neurologic: grossly non-focal.          Labs on Admission:  Basic Metabolic Panel:  Recent Labs Lab 04/18/14 1510  NA 140  K 4.5  CL 104  CO2 25  GLUCOSE 85  BUN 12  CREATININE 0.69  CALCIUM 9.5   Liver Function Tests:  Recent Labs Lab 04/18/14 1510  AST 30  ALT 29  ALKPHOS 77  BILITOT 0.2*  PROT 6.9  ALBUMIN 4.0   No results found for this basename: LIPASE, AMYLASE,  in the last 168 hours No results found for this basename: AMMONIA,  in the last 168 hours CBC:  Recent Labs Lab 04/18/14 1510  WBC 12.8*  NEUTROABS 10.6*  HGB 13.3  HCT 40.7  MCV 93.3  PLT 273   Cardiac Enzymes: No  results found for this basename: CKTOTAL, CKMB, CKMBINDEX, TROPONINI,  in the last 168 hours  BNP (last 3 results) No results found for this basename: PROBNP,  in the last 8760 hours CBG: No results found for this basename: GLUCAP,  in the last 168 hours  Radiological Exams on Admission: Ct Abdomen Pelvis Wo Contrast  04/18/2014   CLINICAL DATA:  Lower abdominal pain, worse on the left.  EXAM: CT ABDOMEN AND PELVIS WITHOUT CONTRAST  TECHNIQUE: Multidetector CT imaging of the abdomen and pelvis was performed following the standard protocol without IV contrast.  COMPARISON:  Chest in two views abdomen earlier this same day. CT abdomen and pelvis 08/13/2013.  FINDINGS: There is unchanged scarring in the left lung base. Lung bases are otherwise unremarkable. No pleural or pericardial effusion.  Very small hiatal hernia is noted. Surgical anastomosis in the sigmoid colon postoperative change of ventral hernia repair are identified. There are scattered mildly prominent loops of small bowel and colon both of which demonstrate air-fluid levels. No transition point is identified. No pneumatosis, free intraperitoneal air, portal venous gas or fluid collection is identified.  The gallbladder, liver, adrenal glands, spleen, pancreas and kidneys appear normal. No lymphadenopathy is seen. The patient is status post hernia repair. No lytic or sclerotic bony lesion is identified. Postoperative change of L4-S1 fusion is noted.  IMPRESSION: Scattered mildly dilated loops of both small and large bowel with air-fluid levels is likely secondary to ileus and/or enteritis. No transition point is identified. The appearance is not suggestive of obstruction. There is no evidence of ischemia.   Electronically Signed   By: Inge Rise M.D.   On: 04/18/2014 20:29   Dg Abd Acute W/chest  04/18/2014   CLINICAL DATA:  Abdominal pain.  Constipation.  EXAM: ACUTE ABDOMEN SERIES (ABDOMEN 2 VIEW & CHEST 1 VIEW)  COMPARISON:  Chest in  two views abdomen 08/31/2013. CT abdomen and pelvis 08/13/2013.  FINDINGS: Single view of the chest demonstrates emphysematous disease and a small focus of left basilar scar. Heart size is normal. No pneumothorax or pleural effusion.  Two views of the abdomen show multiple air-fluid levels in small  bowel and small bowel dilatation up to 4.0 cm. No free intraperitoneal air is identified. There is some gas and stool in the colon. Mesh from prior hernia repair and changes of lumbar fusion noted.  IMPRESSION: Abnormal bowel gas pattern compatible with small bowel obstruction.  Emphysema without acute disease.   Electronically Signed   By: Inge Rise M.D.   On: 04/18/2014 16:00    Assessment/Plan Active Problems:   DEPRESSION   IBS   VOMITING   Abdominal pain   Ileus   Tobacco abuse   1. Abdominal pain, likely secondary to ileus versus enteritis. Case was discussed by ER physician with general surgery. Recommendations were for medical admission. She'll be kept on bowel rest. We will provide enema to stimulate bowel function. We'll cover her with ciprofloxacin and Flagyl check stool studies for underlying enteritis. Diet can be advanced as tolerated. We'll check TSH 2. Irritable bowel syndrome. We'll hold dicyclomine for now. 3. Nausea and vomiting. Continue antiemetics and bowel rest. 4. Depression. Continue psychotropics. 5. Tobacco abuse. Counseled on the importance of tobacco cessation.    Code Status: full code Family Communication: no family present Disposition Plan: she can discharge home once abdominal pain and vomiting improved, anticipate in next 24-48 hours  Time spent: 40mins  MEMON,JEHANZEB [facility] Pager [phone]  **Disclaimer: This note may have been dictated with voice recognition software. Similar sounding words can inadvertently be transcribed and this note may contain transcription errors which may not have been corrected upon publication of note.**

## 2014-04-18 NOTE — ED Notes (Signed)
Ultrasound used to place IV in right AC.

## 2014-04-18 NOTE — ED Provider Notes (Signed)
CSN: [id number]     Arrival date & time 04/18/14  1314 History  This chart was scribed for Chelsea Norrie, MD by Vernell Barrier, ED scribe. This patient was seen in room APA18/APA18 and the patient's care was started at 2:48 PM.    Chief Complaint  Patient presents with  . Abdominal Pain   The history is provided by the patient. No language interpreter was used.   HPI Comments: [name] is a 53 y.o. female w/ hx of bowel obstruction w/ resection and prolapse bowel presents to the Emergency Department complaining of intermittent cramping abdominal pain with distension; onset last night. Associated nausea and vomiting. Did not vomit until she arrived in the ED. Pt states she has had constipation for the past 1 week with hard stools and passing some balls. She states it was difficult to pass the stool and she has to strain a lot. She took an oral laxative last night. Reports pain began in the epigastrium soon after and has since moved lower in the abdomen. Now reports stools are clear liquid and mucous consistent. Denies rectal pain, but has pain in her abdomen when she strains. Taken Colace, Phenergan, Bentyl, and Vicodin with no relief to sxs. Takes Colace daily. Taking 3 Vicodin daily for degenerative disc disease. Bowel obstruction and prolapse were approximately 10 years ago. Smokes 1 pack of cigarettes a day. Does not drink. Pt on disability.    PCP: Dr. Amedeo Kinsman at [facility] in [place] [facility] [place]  Past Medical History  Diagnosis Date  . S/P colonoscopy 06/02/09    normal ([name])  . PUD (peptic ulcer disease) 01/2007    EGD Dr [name], 2 antral ulcers, negative h pylori  . MRSA infection     Dr. [name], currently under treatment  . Hiatal hernia 2008    on EGD above  . Asthma   . PTSD (post-traumatic stress disorder)   . Migraines   . Degenerative disc disease   . Rectal prolapse   . SBO (small bowel obstruction)   .  ADHD (attention deficit hyperactivity disorder)   . S/P endoscopy 03/26/11    gastritis-Dr [name]  . Clostridium difficile colitis 04/10/11 &11/2011    tx w/ flagyl  . Chronic abdominal pain 2003    EX LAP APR 2004 RUPTURE L OV CYST, JUL 2004 ADHESIONS  . Irritable bowel syndrome 2004 CONSTIPATION  . BMI (body mass index) 20.0-29.9 2009 127 LBS  . COPD (chronic obstructive pulmonary disease)   . Chronic nausea   . Anginal pain    Past Surgical History  Procedure Laterality Date  . Bowel resection      x2, secondary to adhesions  . Appendectomy    . Laparoscopic lysis intestinal adhesions    . Back surgery    . Abdominal hysterectomy    . Tubal ligation    . Umbilical hernia repair  2008    x5  . Neck surgery  2009  . Partial hysterectomy    . Upper gastrointestinal endoscopy  APR 2008 [facility] BLEEDING/PAIN    PUD  . Upper gastrointestinal endoscopy  JUL 2012 [facility] PAIN    MILD GASTRITIS  . Esophagogastroduodenoscopy  03/23/2011     Normal esophagus without evidence of Barrett's, mass, erosions, or ulcerations./ Patchy erythema with occasional erosion in the antrum.  Biopsies  obtained via cold forceps to evaluate for H. pylori gastritis/ Small hiatal hernia./Normal duodenal bulb and second portion of the duodenum.  Marland Kitchen  Cervical spine surgery    . Flexible sigmoidoscopy  07/14/2002    Normal limited flexible sigmoidoscopy with stool in the rectum and rectosigmoid precluding a full colonoscopy  . Colonoscopy  03/21/2012     UYQ:IHKVQQ ADENOMA(1) POOR PREP  . Esophagogastroduodenoscopy  03/21/2012    VZD:GLOVF Hiatal hernia/ABDOMINALPAIN/DIARRHEA MOST LIKELY DUE TO IBS, GASTRITIS DUODENITIS  . Colonoscopy N/A 11/17/2013    IEP:PIRJJO mucosa in the terminal ileum/NORMAL surgical anastomosis/Small internal hemorrhoids   Family History  Problem Relation Age of Onset  . Adopted: Yes   History  Substance Use Topics  . Smoking status: Current Every Day Smoker -- 0.10 packs/day for 10 years     Types: Cigarettes  . Smokeless tobacco: Former Systems developer  . Alcohol Use: No   Lives at home Lives with spouse  OB History   Grav Para Term Preterm Abortions TAB SAB Ect Mult Living   1 1 1       1      Review of Systems  Gastrointestinal: Positive for nausea, vomiting, abdominal pain, diarrhea and abdominal distention. Negative for constipation.  All other systems reviewed and are negative.  Allergies  Penicillins; Sulfa antibiotics; Aspirin; Ibuprofen; Lactose intolerance (gi); and Morphine and related  Home Medications   Prior to Admission medications   Medication Sig Start Date End Date Taking? Authorizing Provider  albuterol (PROVENTIL,VENTOLIN) 90 MCG/ACT inhaler Inhale 2 puffs into the lungs every 6 (six) hours as needed. For shortness of breath   Yes Historical Provider, MD  ALPRAZolam Duanne Moron) 1 MG tablet Take 1-2 mg by mouth 3 (three) times daily. Patient takes 1 tablet twice a day and 2 tablets at bedtime   Yes Historical Provider, MD  amphetamine-dextroamphetamine (ADDERALL XR) 15 MG 24 hr capsule Take 15 mg by mouth 2 (two) times daily.    Yes Historical Provider, MD  cyclobenzaprine (FLEXERIL) 5 MG tablet Take 5 mg by mouth every 8 (eight) hours as needed for muscle spasms.   Yes Historical Provider, MD  dicyclomine (BENTYL) 10 MG capsule Take 10 mg by mouth 3 (three) times daily as needed for spasms.   Yes Historical Provider, MD  docusate sodium 100 MG CAPS Take 100 mg by mouth 2 (two) times daily. 08/22/13  Yes [name], MD  DULoxetine (CYMBALTA) 60 MG capsule Take 120 mg by mouth every morning.    Yes Historical Provider, MD  esomeprazole (NEXIUM) 40 MG capsule Take 40 mg by mouth daily. 10/06/13  Yes Orvil Feil, NP  gabapentin (NEURONTIN) 300 MG capsule Take 300-900 mg by mouth 2 (two) times daily. 1 capsule in the morning and 3 capsules at bedtime   Yes Historical Provider, MD  HYDROcodone-acetaminophen (NORCO) 10-325 MG per tablet Take 1 tablet by mouth  every 4 (four) hours as needed. pain 09/02/13  Yes Historical Provider, MD  lidocaine (LIDODERM) 5 % Place 1 patch onto the skin daily as needed. Uses for pain in back. Not used regularly.   Yes Historical Provider, MD  mirtazapine (REMERON) 15 MG tablet Take 1 tablet by mouth at bedtime as needed.  08/17/13  Yes Historical Provider, MD  montelukast (SINGULAIR) 10 MG tablet Take 10 mg by mouth daily.  01/14/12  Yes Historical Provider, MD  Multiple Vitamin (MULTIVITAMIN WITH MINERALS) TABS Take 1 tablet by mouth daily.   Yes Historical Provider, MD  promethazine (PHENERGAN) 25 MG suppository Place 25 mg rectally every 6 (six) hours as needed for nausea or vomiting.   Yes Historical Provider, MD  topiramate (TOPAMAX) 25 MG tablet Take 25 mg by mouth 3 (three) times daily.   Yes Historical Provider, MD   Triage vitals: BP 118/76  Pulse 81  Temp(Src) 98.2 F (36.8 C) (Oral)  Resp 18  Ht 5\' 5"  (1.651 m)  Wt 138 lb (62.596 kg)  BMI 22.96 kg/m2  SpO2 100%  Vital signs normal    Physical Exam  Nursing note and vitals reviewed. Constitutional: She is oriented to person, place, and time. She appears well-developed and well-nourished.  Non-toxic appearance. She does not appear ill. No distress.  HENT:  Head: Normocephalic and atraumatic.  Right Ear: External ear normal.  Left Ear: External ear normal.  Nose: Nose normal. No mucosal edema or rhinorrhea.  Mouth/Throat: Oropharynx is clear and moist and mucous membranes are normal. No dental abscesses or uvula swelling.  Tongue appeared dry.  Eyes: Conjunctivae and EOM are normal. Pupils are equal, round, and reactive to light.  Neck: Normal range of motion and full passive range of motion without pain. Neck supple.  Cardiovascular: Normal rate, regular rhythm and normal heart sounds.  Exam reveals no gallop and no friction rub.   No murmur heard. Pulmonary/Chest: Effort normal and breath sounds normal. No respiratory distress. She has no  wheezes. She has no rhonchi. She has no rales. She exhibits no tenderness and no crepitus.  Abdominal: Soft. Normal appearance. She exhibits no distension. Bowel sounds are increased. There is tenderness in the left upper quadrant and left lower quadrant. There is no rebound and no guarding.    High pitched tinkling bowel sounds. Abdomen looks distended and she is very tender on the left.   Musculoskeletal: Normal range of motion. She exhibits no edema and no tenderness.  Moves all extremities well.   Neurological: She is alert and oriented to person, place, and time. She has normal strength. No cranial nerve deficit.  Skin: Skin is warm, dry and intact. No rash noted. No erythema. No pallor.  Psychiatric: She has a normal mood and affect. Her speech is normal and behavior is normal. Her mood appears not anxious.    ED Course  Procedures (including critical care time) Medications  0.9 %  sodium chloride infusion (0 mLs Intravenous Stopped 04/18/14 1959)    Followed by  0.9 %  sodium chloride infusion (1,000 mLs Intravenous New Bag/Given 04/18/14 1959)  nicotine (NICODERM CQ - dosed in mg/24 hours) patch 14 mg (14 mg Transdermal Patch Applied 04/18/14 1934)  ondansetron (ZOFRAN) injection 4 mg (4 mg Intravenous Given 04/18/14 1518)  fentaNYL (SUBLIMAZE) injection 50 mcg (50 mcg Intravenous Given 04/18/14 1518)  fentaNYL (SUBLIMAZE) injection 50 mcg (50 mcg Intravenous Given 04/18/14 1755)  iohexol (OMNIPAQUE) 300 MG/ML solution 50 mL (50 mLs Oral Contrast Given 04/18/14 1702)    DIAGNOSTIC STUDIES: Oxygen Saturation is 100% on room air, normal by my interpretation.    COORDINATION OF CARE: At 2:57 PM: Discussed treatment plan with patient which includes IV fluids and CT scan of the abdomen. Patient agrees.   Nursing staff had difficulty starting IV. IV was started in her foot. However she cannot get CT to an IV in her foot. Patient was discussed with Dr. Arnoldo Morale at [phone]. He states to do the CT  scan with just oral contrast. He states he will consult on her in the morning, have the hospitalist admit.  21:35 Pt states she is still having pain. Her NG was never inserted by nursing staff.   21:47 Dr Arnoldo Morale discussed CT results,  would like medicine to admit.   23:00 waiting for hospitalist to call back, Dr Sabra Heck will talk to them.   Labs Review Results for orders placed during the hospital encounter of 04/18/14  CBC WITH DIFFERENTIAL      Result Value Ref Range   WBC 12.8 (*) 4.0 - 10.5 K/uL   RBC 4.36  3.87 - 5.11 MIL/uL   Hemoglobin 13.3  12.0 - 15.0 g/dL   HCT 40.7  36.0 - 46.0 %   MCV 93.3  78.0 - 100.0 fL   MCH 30.5  26.0 - 34.0 pg   MCHC 32.7  30.0 - 36.0 g/dL   RDW 13.5  11.5 - 15.5 %   Platelets 273  150 - 400 K/uL   Neutrophils Relative % 83 (*) 43 - 77 %   Neutro Abs 10.6 (*) 1.7 - 7.7 K/uL   Lymphocytes Relative 10 (*) 12 - 46 %   Lymphs Abs 1.3  0.7 - 4.0 K/uL   Monocytes Relative 5  3 - 12 %   Monocytes Absolute 0.7  0.1 - 1.0 K/uL   Eosinophils Relative 2  0 - 5 %   Eosinophils Absolute 0.3  0.0 - 0.7 K/uL   Basophils Relative 0  0 - 1 %   Basophils Absolute 0.0  0.0 - 0.1 K/uL  COMPREHENSIVE METABOLIC PANEL      Result Value Ref Range   Sodium 140  137 - 147 mEq/L   Potassium 4.5  3.7 - 5.3 mEq/L   Chloride 104  96 - 112 mEq/L   CO2 25  19 - 32 mEq/L   Glucose, Bld 85  70 - 99 mg/dL   BUN 12  6 - 23 mg/dL   Creatinine, Ser 0.69  0.50 - 1.10 mg/dL   Calcium 9.5  8.4 - 10.5 mg/dL   Total Protein 6.9  6.0 - 8.3 g/dL   Albumin 4.0  3.5 - 5.2 g/dL   AST 30  0 - 37 U/L   ALT 29  0 - 35 U/L   Alkaline Phosphatase 77  39 - 117 U/L   Total Bilirubin 0.2 (*) 0.3 - 1.2 mg/dL   GFR calc non Af Amer >90  >90 mL/min   GFR calc Af Amer >90  >90 mL/min   Anion gap 11  5 - 15  URINALYSIS, ROUTINE W REFLEX MICROSCOPIC      Result Value Ref Range   Color, Urine YELLOW  YELLOW   APPearance CLEAR  CLEAR   Specific Gravity, Urine 1.015  1.005 - 1.030   pH 5.5   5.0 - 8.0   Glucose, UA NEGATIVE  NEGATIVE mg/dL   Hgb urine dipstick NEGATIVE  NEGATIVE   Bilirubin Urine NEGATIVE  NEGATIVE   Ketones, ur NEGATIVE  NEGATIVE mg/dL   Protein, ur NEGATIVE  NEGATIVE mg/dL   Urobilinogen, UA 0.2  0.0 - 1.0 mg/dL   Nitrite NEGATIVE  NEGATIVE   Leukocytes, UA NEGATIVE  NEGATIVE    Laboratory interpretation all normal except leukocytosis    Imaging Review Ct Abdomen Pelvis Wo Contrast  04/18/2014   CLINICAL DATA:  Lower abdominal pain, worse on the left.  EXAM: CT ABDOMEN AND PELVIS WITHOUT CONTRAST  TECHNIQUE: Multidetector CT imaging of the abdomen and pelvis was performed following the standard protocol without IV contrast.  COMPARISON:  Chest in two views abdomen earlier this same day. CT abdomen and pelvis 08/13/2013.  FINDINGS: There is unchanged scarring in the left lung base.  Lung bases are otherwise unremarkable. No pleural or pericardial effusion.  Very small hiatal hernia is noted. Surgical anastomosis in the sigmoid colon postoperative change of ventral hernia repair are identified. There are scattered mildly prominent loops of small bowel and colon both of which demonstrate air-fluid levels. No transition point is identified. No pneumatosis, free intraperitoneal air, portal venous gas or fluid collection is identified.  The gallbladder, liver, adrenal glands, spleen, pancreas and kidneys appear normal. No lymphadenopathy is seen. The patient is status post hernia repair. No lytic or sclerotic bony lesion is identified. Postoperative change of L4-S1 fusion is noted.  IMPRESSION: Scattered mildly dilated loops of both small and large bowel with air-fluid levels is likely secondary to ileus and/or enteritis. No transition point is identified. The appearance is not suggestive of obstruction. There is no evidence of ischemia.   Electronically Signed   By: Inge Rise M.D.   On: 04/18/2014 20:29   Dg Abd Acute W/chest  04/18/2014   CLINICAL DATA:   Abdominal pain.  Constipation.  EXAM: ACUTE ABDOMEN SERIES (ABDOMEN 2 VIEW & CHEST 1 VIEW)  COMPARISON:  Chest in two views abdomen 08/31/2013. CT abdomen and pelvis 08/13/2013.  FINDINGS: Single view of the chest demonstrates emphysematous disease and a small focus of left basilar scar. Heart size is normal. No pneumothorax or pleural effusion.  Two views of the abdomen show multiple air-fluid levels in small bowel and small bowel dilatation up to 4.0 cm. No free intraperitoneal air is identified. There is some gas and stool in the colon. Mesh from prior hernia repair and changes of lumbar fusion noted.  IMPRESSION: Abnormal bowel gas pattern compatible with small bowel obstruction.  Emphysema without acute disease.   Electronically Signed   By: Inge Rise M.D.   On: 04/18/2014 16:00     EKG Interpretation None      MDM   Final diagnoses:  Generalized abdominal pain  Nausea and vomiting, vomiting of unspecified type  Dehydration    Plan admission  I personally performed the services described in this documentation, which was scribed in my presence. The recorded information has been reviewed and is accurate.   Rolland Porter, MD, [facility] Sander   Chelsea Norrie, MD 04/18/14 2256

## 2014-04-19 LAB — CREATININE, SERUM
Creatinine, Ser: 0.66 mg/dL (ref 0.50–1.10)
GFR calc Af Amer: >90 mL/min (ref >90)
GFR calc non Af Amer: >90 mL/min (ref >90)

## 2014-04-19 LAB — BASIC METABOLIC PANEL
ANION GAP: 10 (ref 5–15)
BUN: 8 mg/dL (ref 6–23)
CHLORIDE: 113 meq/L — AB (ref 96–112)
CO2: 20 mEq/L (ref 19–32)
Calcium: 8.2 mg/dL — ABNORMAL LOW (ref 8.4–10.5)
Creatinine, Ser: 0.68 mg/dL (ref 0.50–1.10)
GFR calc Af Amer: >90 mL/min (ref >90)
GFR calc non Af Amer: >90 mL/min (ref >90)
Glucose, Bld: 112 mg/dL — ABNORMAL HIGH (ref 70–99)
POTASSIUM: 3.7 meq/L (ref 3.7–5.3)
SODIUM: 143 meq/L (ref 137–147)

## 2014-04-19 LAB — CBC
HEMATOCRIT: 34.7 % — AB (ref 36.0–46.0)
Hemoglobin: 10.9 g/dL — ABNORMAL LOW (ref 12.0–15.0)
MCH: 29.8 pg (ref 26.0–34.0)
MCHC: 31.4 g/dL (ref 30.0–36.0)
MCV: 94.8 fL (ref 78.0–100.0)
Platelets: 254 10*3/uL (ref 150–400)
RBC: 3.66 MIL/uL — AB (ref 3.87–5.11)
RDW: 13.7 % (ref 11.5–15.5)
WBC: 6.6 10*3/uL (ref 4.0–10.5)

## 2014-04-19 LAB — TSH: TSH: 1.57 u[IU]/mL (ref 0.350–4.500)

## 2014-04-19 LAB — MRSA PCR SCREENING: MRSA by PCR: POSITIVE — AB

## 2014-04-19 MED ORDER — SODIUM CHLORIDE 0.9 % IV SOLN
INTRAVENOUS | Status: DC
  Administered 2014-04-19 – 2014-04-20 (×4): via INTRAVENOUS

## 2014-04-19 MED ORDER — DULOXETINE HCL 60 MG PO CPEP
120.0000 mg | ORAL_CAPSULE | Freq: Every day | ORAL | Status: DC
  Administered 2014-04-19 – 2014-04-20 (×2): 120 mg via ORAL
  Filled 2014-04-19 (×2): qty 2

## 2014-04-19 MED ORDER — PROMETHAZINE HCL 25 MG/ML IJ SOLN
12.5000 mg | Freq: Four times a day (QID) | INTRAMUSCULAR | Status: DC | PRN
  Administered 2014-04-19 – 2014-04-20 (×3): 12.5 mg via INTRAVENOUS
  Filled 2014-04-19 (×3): qty 1

## 2014-04-19 MED ORDER — CHLORHEXIDINE GLUCONATE CLOTH 2 % EX PADS: 6.0000 | MEDICATED_PAD | Freq: Every day | CUTANEOUS | Status: DC

## 2014-04-19 MED ORDER — AMPHETAMINE-DEXTROAMPHET ER 15 MG PO CP24: 15.0000 mg | ORAL_CAPSULE | Freq: Two times a day (BID) | ORAL | Status: DC

## 2014-04-19 MED ORDER — MUPIROCIN 2 % EX OINT
1.0000 "application " | TOPICAL_OINTMENT | Freq: Two times a day (BID) | CUTANEOUS | Status: DC
  Administered 2014-04-19 – 2014-04-20 (×2): 1 via NASAL
  Filled 2014-04-19 (×2): qty 22

## 2014-04-19 MED ORDER — HYDROMORPHONE HCL PF 1 MG/ML IJ SOLN
0.5000 mg | INTRAMUSCULAR | Status: DC | PRN
  Administered 2014-04-19 – 2014-04-20 (×5): 0.5 mg via INTRAVENOUS
  Filled 2014-04-19 (×5): qty 1

## 2014-04-19 MED ORDER — ALPRAZOLAM 1 MG PO TABS
1.0000 mg | ORAL_TABLET | Freq: Two times a day (BID) | ORAL | Status: DC
  Administered 2014-04-19 – 2014-04-20 (×3): 1 mg via ORAL
  Filled 2014-04-19 (×3): qty 1

## 2014-04-19 MED ORDER — METRONIDAZOLE IN NACL 5-0.79 MG/ML-% IV SOLN
500.0000 mg | Freq: Three times a day (TID) | INTRAVENOUS | Status: DC
  Administered 2014-04-19: 500 mg via INTRAVENOUS
  Filled 2014-04-19: qty 100

## 2014-04-19 MED ORDER — GABAPENTIN 300 MG PO CAPS
300.0000 mg | ORAL_CAPSULE | Freq: Every day | ORAL | Status: DC
  Administered 2014-04-19 – 2014-04-20 (×2): 300 mg via ORAL
  Filled 2014-04-19 (×2): qty 1

## 2014-04-19 MED ORDER — DOCUSATE SODIUM 100 MG PO CAPS
100.0000 mg | ORAL_CAPSULE | Freq: Two times a day (BID) | ORAL | Status: DC
  Administered 2014-04-19 – 2014-04-20 (×3): 100 mg via ORAL
  Filled 2014-04-19 (×3): qty 1

## 2014-04-19 MED ORDER — ENOXAPARIN SODIUM 40 MG/0.4ML ~~LOC~~ SOLN
40.0000 mg | SUBCUTANEOUS | Status: DC
  Administered 2014-04-19 – 2014-04-20 (×2): 40 mg via SUBCUTANEOUS
  Filled 2014-04-19 (×2): qty 0.4

## 2014-04-19 MED ORDER — PANTOPRAZOLE SODIUM 40 MG IV SOLR
40.0000 mg | Freq: Every day | INTRAVENOUS | Status: DC
  Administered 2014-04-19: 40 mg via INTRAVENOUS
  Filled 2014-04-19: qty 40

## 2014-04-19 MED ORDER — ALPRAZOLAM 1 MG PO TABS
2.0000 mg | ORAL_TABLET | Freq: Every day | ORAL | Status: DC
  Administered 2014-04-19 (×2): 2 mg via ORAL
  Filled 2014-04-19 (×2): qty 2

## 2014-04-19 MED ORDER — ACETAMINOPHEN 325 MG PO TABS: 650.0000 mg | ORAL_TABLET | Freq: Four times a day (QID) | ORAL | Status: DC | PRN

## 2014-04-19 MED ORDER — MILK AND MOLASSES ENEMA: 1.0000 | Freq: Once | RECTAL | Status: DC

## 2014-04-19 MED ORDER — PANTOPRAZOLE SODIUM 40 MG PO TBEC
40.0000 mg | DELAYED_RELEASE_TABLET | Freq: Every day | ORAL | Status: DC
  Administered 2014-04-19 – 2014-04-20 (×2): 40 mg via ORAL
  Filled 2014-04-19 (×2): qty 1

## 2014-04-19 MED ORDER — CYCLOBENZAPRINE HCL 10 MG PO TABS: 5.0000 mg | ORAL_TABLET | Freq: Three times a day (TID) | ORAL | Status: DC | PRN

## 2014-04-19 MED ORDER — MONTELUKAST SODIUM 10 MG PO TABS
10.0000 mg | ORAL_TABLET | Freq: Every day | ORAL | Status: DC
  Administered 2014-04-19 – 2014-04-20 (×2): 10 mg via ORAL
  Filled 2014-04-19 (×2): qty 1

## 2014-04-19 MED ORDER — NICOTINE 14 MG/24HR TD PT24
14.0000 mg | MEDICATED_PATCH | Freq: Every day | TRANSDERMAL | Status: DC
  Administered 2014-04-19 – 2014-04-20 (×2): 14 mg via TRANSDERMAL
  Filled 2014-04-19: qty 1

## 2014-04-19 MED ORDER — CIPROFLOXACIN IN D5W 400 MG/200ML IV SOLN
400.0000 mg | Freq: Two times a day (BID) | INTRAVENOUS | Status: DC
  Administered 2014-04-19 (×2): 400 mg via INTRAVENOUS
  Filled 2014-04-19 (×2): qty 200

## 2014-04-19 MED ORDER — SODIUM CHLORIDE 0.9 % IV BOLUS (SEPSIS)
500.0000 mL | Freq: Once | INTRAVENOUS | Status: AC
  Administered 2014-04-19: 500 mL via INTRAVENOUS

## 2014-04-19 MED ORDER — ACETAMINOPHEN 650 MG RE SUPP: 650.0000 mg | Freq: Four times a day (QID) | RECTAL | Status: DC | PRN

## 2014-04-19 MED ORDER — TOPIRAMATE 25 MG PO TABS
25.0000 mg | ORAL_TABLET | Freq: Three times a day (TID) | ORAL | Status: DC
  Administered 2014-04-19 – 2014-04-20 (×4): 25 mg via ORAL
  Filled 2014-04-19 (×8): qty 1

## 2014-04-19 MED ORDER — GABAPENTIN 300 MG PO CAPS
900.0000 mg | ORAL_CAPSULE | Freq: Every day | ORAL | Status: DC
  Administered 2014-04-19 (×2): 900 mg via ORAL
  Filled 2014-04-19 (×2): qty 3

## 2014-04-19 NOTE — Progress Notes (Signed)
Pt BP in the 80's. MD aware. Orders given. Pt asymptomatic. Also pt states she wanted to receive milk and molasses enema in the AM. Will do enema at 4 am. Will continue to monitor.

## 2014-04-19 NOTE — Progress Notes (Signed)
The patient is receiving Protonix by the intravenous route.  Based on criteria approved by the Pharmacy and [place], the medication is being converted to the equivalent oral dose form.  These criteria include: -No Active GI bleeding -Able to tolerate diet of full liquids (or better) or tube feeding OR able to tolerate other medications by the oral or enteral route  If you have any questions about this conversion, please contact the Pharmacy Department (ext [phone]).  Thank you.  Biagio Borg, [facility] 04/19/2014 4:56 PM

## 2014-04-19 NOTE — Consult Note (Signed)
Patient seen, chart reviewed. Full consult to follow.  HIDA scan pending.

## 2014-04-19 NOTE — Progress Notes (Signed)
UR chart review completed.  
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
## 2014-04-19 NOTE — Care Management Note (Signed)
    Page 1 of 1   04/19/2014     12:35:35 PM CARE MANAGEMENT NOTE 04/19/2014  Patient:  Chelsea, Lewis   Account Number:  [id number]  Date Initiated:  04/19/2014  Documentation initiated by:  Theophilus Kinds  Subjective/Objective Assessment:   Pt admitted from home with ileus. Pt lives alone in a senior citizen apt. Pt has a neighbor that she calls on to help her when needed. Pt has a cane and walker for prn use. Pt os working with [place] for CPS aide.     Action/Plan:   Will continue to follow for discharge planning needs.   Anticipated DC Date:  04/22/2014   Anticipated DC Plan:  [place]  CM consult      Choice offered to / List presented to:             Status of service:  Completed, signed off Medicare Important Message given?   (If response is "NO", the following Medicare IM given date fields will be blank) Date Medicare IM given:   Medicare IM given by:   Date Additional Medicare IM given:   Additional Medicare IM given by:    Discharge Disposition:  HOME/SELF CARE  Per UR Regulation:    If discussed at Long Length of Stay Meetings, dates discussed:    Comments:  04/19/14 [place], RN BSN CM

## 2014-04-19 NOTE — Progress Notes (Signed)
Pt refused milk and molasses enema. States "I just want to rest right now." Will continue to monitor.

## 2014-04-20 ENCOUNTER — Inpatient Hospital Stay (HOSPITAL_COMMUNITY): Payer: PRIVATE HEALTH INSURANCE

## 2014-04-20 LAB — CBC
HEMATOCRIT: 31.5 % — AB (ref 36.0–46.0)
HEMOGLOBIN: 10.1 g/dL — AB (ref 12.0–15.0)
MCH: 30.1 pg (ref 26.0–34.0)
MCHC: 32.1 g/dL (ref 30.0–36.0)
MCV: 93.8 fL (ref 78.0–100.0)
Platelets: 231 10*3/uL (ref 150–400)
RBC: 3.36 MIL/uL — ABNORMAL LOW (ref 3.87–5.11)
RDW: 13.5 % (ref 11.5–15.5)
WBC: 5 10*3/uL (ref 4.0–10.5)

## 2014-04-20 LAB — COMPREHENSIVE METABOLIC PANEL
ALK PHOS: 62 U/L (ref 39–117)
ALT: 17 U/L (ref 0–35)
AST: 18 U/L (ref 0–37)
Albumin: 3.1 g/dL — ABNORMAL LOW (ref 3.5–5.2)
Anion gap: 8 (ref 5–15)
BUN: 9 mg/dL (ref 6–23)
CO2: 24 meq/L (ref 19–32)
Calcium: 8.5 mg/dL (ref 8.4–10.5)
Chloride: 112 mEq/L (ref 96–112)
Creatinine, Ser: 0.65 mg/dL (ref 0.50–1.10)
GLUCOSE: 93 mg/dL (ref 70–99)
POTASSIUM: 3.9 meq/L (ref 3.7–5.3)
Sodium: 144 mEq/L (ref 137–147)
Total Bilirubin: <0.2 mg/dL — ABNORMAL LOW (ref 0.3–1.2)
Total Protein: 5.3 g/dL — ABNORMAL LOW (ref 6.0–8.3)

## 2014-04-20 MED ORDER — NICOTINE 14 MG/24HR TD PT24: 14.0000 mg | MEDICATED_PATCH | Freq: Every day | TRANSDERMAL | Status: DC

## 2014-04-20 NOTE — Progress Notes (Signed)
Pt refusing HIDA scan as her stomach is having "seizures".  Pt states she will not have the procedure while her stomach is feeling this way.
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
## 2014-04-20 NOTE — Progress Notes (Signed)
Patient refused hepatobiliary scan. She thinks it is her gallbladder, but is difficult to assess her given her chronic abdominal pain. She has had negative tests including ultrasound and hepatobiliary scans in the past. CT scan was unremarkable for any acute surgical issue. I do not want to proceed with any surgical intervention without a definitive reason. I did explain this to the patient. Will follow peripherally with you.

## 2014-05-17 ENCOUNTER — Other Ambulatory Visit: Payer: Self-pay

## 2014-05-17 MED ORDER — ESOMEPRAZOLE MAGNESIUM 40 MG PO CPDR: 40.0000 mg | DELAYED_RELEASE_CAPSULE | Freq: Two times a day (BID) | ORAL | Status: DC

## 2014-05-17 NOTE — Telephone Encounter (Signed)
Patient needs OV with [facility] for f/u. Will refill Nexium for BID for now but need to consider decreasing to once daily at [facility].

## 2014-05-18 ENCOUNTER — Encounter: Payer: Self-pay | Admitting: Gastroenterology

## 2014-05-18 NOTE — Telephone Encounter (Signed)
Left all of the message Vm for pt. Routing to [place] to schedule appt.

## 2014-05-18 NOTE — Telephone Encounter (Signed)
Pt is aware of OV on 9/23 at 0830 with SF and appt card mailed

## 2014-05-31 ENCOUNTER — Telehealth: Payer: Self-pay | Admitting: Gastroenterology

## 2014-05-31 NOTE — Telephone Encounter (Signed)
I was calling to give the patient the contact information for patient experience [phone]), however there was no answer, lmom.
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
## 2014-06-01 NOTE — Telephone Encounter (Signed)
I gave the patient the contact information for patient experience office.

## 2014-06-15 ENCOUNTER — Other Ambulatory Visit: Payer: Self-pay | Admitting: Gastroenterology

## 2014-06-15 DIAGNOSIS — R1084 Generalized abdominal pain: Secondary | ICD-10-CM

## 2014-06-24 ENCOUNTER — Other Ambulatory Visit: Payer: PRIVATE HEALTH INSURANCE

## 2014-06-28 ENCOUNTER — Encounter (HOSPITAL_COMMUNITY): Payer: Self-pay | Admitting: Emergency Medicine

## 2014-06-28 ENCOUNTER — Emergency Department (HOSPITAL_COMMUNITY)
Admission: EM | Admit: 2014-06-28 | Discharge: 2014-06-28 | Disposition: A | Discharge status: Home / Self Care | Payer: PRIVATE HEALTH INSURANCE | Attending: Emergency Medicine | Admitting: Emergency Medicine

## 2014-06-28 DIAGNOSIS — J449 Chronic obstructive pulmonary disease, unspecified: Secondary | ICD-10-CM | POA: Insufficient documentation

## 2014-06-28 DIAGNOSIS — R300 Dysuria: Secondary | ICD-10-CM | POA: Diagnosis present

## 2014-06-28 DIAGNOSIS — Z880 Allergy status to penicillin: Secondary | ICD-10-CM | POA: Diagnosis not present

## 2014-06-28 DIAGNOSIS — N390 Urinary tract infection, site not specified: Secondary | ICD-10-CM | POA: Diagnosis not present

## 2014-06-28 DIAGNOSIS — Z5189 Encounter for other specified aftercare: Secondary | ICD-10-CM | POA: Diagnosis not present

## 2014-06-28 DIAGNOSIS — F17200 Nicotine dependence, unspecified, uncomplicated: Secondary | ICD-10-CM | POA: Insufficient documentation

## 2014-06-28 DIAGNOSIS — R61 Generalized hyperhidrosis: Secondary | ICD-10-CM | POA: Diagnosis not present

## 2014-06-28 DIAGNOSIS — Z8739 Personal history of other diseases of the musculoskeletal system and connective tissue: Secondary | ICD-10-CM | POA: Insufficient documentation

## 2014-06-28 DIAGNOSIS — Z8619 Personal history of other infectious and parasitic diseases: Secondary | ICD-10-CM | POA: Insufficient documentation

## 2014-06-28 DIAGNOSIS — K589 Irritable bowel syndrome without diarrhea: Secondary | ICD-10-CM | POA: Insufficient documentation

## 2014-06-28 DIAGNOSIS — Z9089 Acquired absence of other organs: Secondary | ICD-10-CM | POA: Insufficient documentation

## 2014-06-28 DIAGNOSIS — Z9889 Other specified postprocedural states: Secondary | ICD-10-CM | POA: Diagnosis not present

## 2014-06-28 DIAGNOSIS — G8929 Other chronic pain: Secondary | ICD-10-CM | POA: Diagnosis not present

## 2014-06-28 DIAGNOSIS — J4489 Other specified chronic obstructive pulmonary disease: Secondary | ICD-10-CM | POA: Insufficient documentation

## 2014-06-28 DIAGNOSIS — Z8614 Personal history of Methicillin resistant Staphylococcus aureus infection: Secondary | ICD-10-CM | POA: Insufficient documentation

## 2014-06-28 DIAGNOSIS — F4310 Post-traumatic stress disorder, unspecified: Secondary | ICD-10-CM | POA: Insufficient documentation

## 2014-06-28 LAB — URINALYSIS, ROUTINE W REFLEX MICROSCOPIC
BILIRUBIN URINE: NEGATIVE
Glucose, UA: 100 mg/dL — AB
KETONES UR: NEGATIVE mg/dL
NITRITE: POSITIVE — AB
Specific Gravity, Urine: 1.015 (ref 1.005–1.030)
UROBILINOGEN UA: 2 mg/dL — AB (ref 0.0–1.0)
pH: 6 (ref 5.0–8.0)

## 2014-06-28 LAB — URINE MICROSCOPIC-ADD ON

## 2014-06-28 MED ORDER — CIPROFLOXACIN HCL 500 MG PO TABS: 500.0000 mg | ORAL_TABLET | Freq: Two times a day (BID) | ORAL | Status: DC

## 2014-06-28 NOTE — ED Notes (Signed)
Pt c/o burning with urination and urinary frequency x 2 days.

## 2014-06-28 NOTE — ED Provider Notes (Signed)
CSN: [id number]     Arrival date & time 06/28/14  1408 History  This chart was scribed for Chelsea Docker, NP with Maudry Diego, MD by Edison Simon, ED Scribe. This patient was seen in room APFT20/APFT20 and the patient's care was started at 2:17 PM.    Chief Complaint  Patient presents with  . Dysuria   The history is provided by the patient. No language interpreter was used.   HPI Comments: TANAI BOULER is a 53 y.o. female who presents to the Emergency Department complaining of dysuria with onset 2 days ago. She states she believes it is due to a UTI. She reports associated frequency, decreased urine, diaphoresis, and nausea. She reports having kidneys stones in the past and states they are unlike current symptoms. She denies back pain or vaginal discharge.  Past Medical History  Diagnosis Date  . S/P colonoscopy 06/02/09    normal ([name])  . PUD (peptic ulcer disease) 01/2007    EGD Dr [name], 2 antral ulcers, negative h pylori  . MRSA infection     Dr. [name], currently under treatment  . Hiatal hernia 2008    on EGD above  . Asthma   . PTSD (post-traumatic stress disorder)   . Migraines   . Degenerative disc disease   . Rectal prolapse   . SBO (small bowel obstruction)   . ADHD (attention deficit hyperactivity disorder)   . S/P endoscopy 03/26/11    gastritis-Dr [name]  . Clostridium difficile colitis 04/10/11 &11/2011    tx w/ flagyl  . Chronic abdominal pain 2003    EX LAP APR 2004 RUPTURE L OV CYST, JUL 2004 ADHESIONS  . Irritable bowel syndrome 2004 CONSTIPATION  . BMI (body mass index) 20.0-29.9 2009 127 LBS  . COPD (chronic obstructive pulmonary disease)   . Chronic nausea   . Anginal pain    Past Surgical History  Procedure Laterality Date  . Bowel resection      x2, secondary to adhesions  . Appendectomy    . Laparoscopic lysis intestinal adhesions    . Back surgery    . Abdominal hysterectomy    . Tubal ligation    . Umbilical hernia  repair  2008    x5  . Neck surgery  2009  . Partial hysterectomy    . Upper gastrointestinal endoscopy  APR 2008 [facility] BLEEDING/PAIN    PUD  . Upper gastrointestinal endoscopy  JUL 2012 [facility] PAIN    MILD GASTRITIS  . Esophagogastroduodenoscopy  03/23/2011     Normal esophagus without evidence of Barrett's, mass, erosions, or ulcerations./ Patchy erythema with occasional erosion in the antrum.  Biopsies  obtained via cold forceps to evaluate for H. pylori gastritis/ Small hiatal hernia./Normal duodenal bulb and second portion of the duodenum.  . Cervical spine surgery    . Flexible sigmoidoscopy  07/14/2002    Normal limited flexible sigmoidoscopy with stool in the rectum and rectosigmoid precluding a full colonoscopy  . Colonoscopy  03/21/2012     ENM:MHWKGS ADENOMA(1) POOR PREP  . Esophagogastroduodenoscopy  03/21/2012    UPJ:SRPRX Hiatal hernia/ABDOMINALPAIN/DIARRHEA MOST LIKELY DUE TO IBS, GASTRITIS DUODENITIS  . Colonoscopy N/A 11/17/2013    YVO:PFYTWK mucosa in the terminal ileum/NORMAL surgical anastomosis/Small internal hemorrhoids   Family History  Problem Relation Age of Onset  . Adopted: Yes   History  Substance Use Topics  . Smoking status: Current Every Day Smoker -- 0.10 packs/day for 10 years    Types: Cigarettes  .  Smokeless tobacco: Former Systems developer  . Alcohol Use: No   OB History   Grav Para Term Preterm Abortions TAB SAB Ect Mult Living   1 1 1       1      Review of Systems  Constitutional: Positive for diaphoresis.  Gastrointestinal: Positive for nausea.  Genitourinary: Positive for dysuria, frequency and decreased urine volume. Negative for flank pain and vaginal discharge.  Musculoskeletal: Negative for back pain.      Allergies  Penicillins; Sulfa antibiotics; Aspirin; Ibuprofen; Lactose intolerance (gi); and Morphine and related  Home Medications   Prior to Admission medications   Medication Sig Start Date End Date Taking? Authorizing Provider   albuterol (PROVENTIL,VENTOLIN) 90 MCG/ACT inhaler Inhale 2 puffs into the lungs every 6 (six) hours as needed. For shortness of breath    Historical Provider, MD  ALPRAZolam Duanne Moron) 1 MG tablet Take 1-2 mg by mouth 3 (three) times daily. Patient takes 1 tablet twice a day and 2 tablets at bedtime    Historical Provider, MD  amphetamine-dextroamphetamine (ADDERALL XR) 15 MG 24 hr capsule Take 15 mg by mouth 2 (two) times daily.     Historical Provider, MD  cyclobenzaprine (FLEXERIL) 5 MG tablet Take 5 mg by mouth every 8 (eight) hours as needed for muscle spasms.    Historical Provider, MD  dicyclomine (BENTYL) 10 MG capsule Take 10 mg by mouth 3 (three) times daily as needed for spasms.    Historical Provider, MD  docusate sodium 100 MG CAPS Take 100 mg by mouth 2 (two) times daily. 08/22/13   [name], MD  DULoxetine (CYMBALTA) 60 MG capsule Take 120 mg by mouth every morning.     Historical Provider, MD  esomeprazole (NEXIUM) 40 MG capsule Take 1 capsule (40 mg total) by mouth 2 (two) times daily before a meal. 05/17/14   [name], PA-C  gabapentin (NEURONTIN) 300 MG capsule Take 300-900 mg by mouth 2 (two) times daily. 1 capsule in the morning and 3 capsules at bedtime    Historical Provider, MD  HYDROcodone-acetaminophen (NORCO) 10-325 MG per tablet Take 1 tablet by mouth every 4 (four) hours as needed. pain 09/02/13   Historical Provider, MD  lidocaine (LIDODERM) 5 % Place 1 patch onto the skin daily as needed. Uses for pain in back. Not used regularly.    Historical Provider, MD  mirtazapine (REMERON) 15 MG tablet Take 1 tablet by mouth at bedtime as needed.  08/17/13   Historical Provider, MD  montelukast (SINGULAIR) 10 MG tablet Take 10 mg by mouth daily.  01/14/12   Historical Provider, MD  Multiple Vitamin (MULTIVITAMIN WITH MINERALS) TABS Take 1 tablet by mouth daily.    Historical Provider, MD  nicotine (NICODERM CQ - DOSED IN MG/24 HOURS) 14 mg/24hr patch Place 1 patch (14 mg  total) onto the skin daily. 04/20/14   Nita Sells, MD  promethazine (PHENERGAN) 25 MG suppository Place 25 mg rectally every 6 (six) hours as needed for nausea or vomiting.    Historical Provider, MD  topiramate (TOPAMAX) 25 MG tablet Take 25 mg by mouth 3 (three) times daily.    Historical Provider, MD   BP 123/72  Pulse 86  Temp(Src) 98.5 F (36.9 C) (Oral)  Resp 18  Ht 5\' 3"  (1.6 m)  Wt 138 lb (62.596 kg)  BMI 24.45 kg/m2  SpO2 99% Physical Exam  Nursing note and vitals reviewed. Constitutional: She is oriented to person, place, and time. She appears well-developed  and well-nourished.  HENT:  Head: Normocephalic and atraumatic.  Eyes: Conjunctivae are normal.  Neck: Normal range of motion. Neck supple.  Cardiovascular: Normal rate, regular rhythm and normal heart sounds.   No murmur heard. Pulmonary/Chest: Effort normal and breath sounds normal. No respiratory distress. She has no wheezes. She has no rales.  Abdominal: Soft. Bowel sounds are normal. There is tenderness (lower abdominal tenderness with palpation). There is no rebound and no guarding.  Musculoskeletal: Normal range of motion.  Neurological: She is alert and oriented to person, place, and time.  Skin: Skin is warm and dry.  Psychiatric: She has a normal mood and affect.    ED Course  Procedures (including critical care time) Labs Review Labs Reviewed  URINALYSIS, ROUTINE W REFLEX MICROSCOPIC  PREGNANCY, URINE    Imaging Review No results found.   EKG Interpretation None     DIAGNOSTIC STUDIES: Oxygen Saturation is 99% on room air, normal by my interpretation.    COORDINATION OF CARE: 2:21 PM Discussed treatment plan with patient at beside, the patient agrees with the plan and has no further questions at this time.    MDM   Final diagnoses:  UTI (lower urinary tract infection)   Will treat for a simple uti. Pt is in agreement with plan. Discussed return symptoms with pt  I personally  performed the services described in this documentation, which was scribed in my presence. The recorded information has been reviewed and is accurate.    Chelsea Docker, NP 06/28/14 1447

## 2014-06-28 NOTE — Discharge Instructions (Signed)

## 2014-06-28 NOTE — ED Notes (Signed)
Low abd pain with dysuria for 2 days. Sl nausea, no vomiting.  Alert, NAD, no fever

## 2014-06-29 NOTE — ED Provider Notes (Signed)
Medical screening examination/treatment/procedure(s) were performed by non-physician practitioner and as supervising physician I was immediately available for consultation/collaboration.   EKG Interpretation None        Derric Dealmeida L Mickie Badders, MD 06/29/14 1253 

## 2014-06-30 ENCOUNTER — Encounter: Payer: PRIVATE HEALTH INSURANCE | Admitting: Gastroenterology

## 2014-06-30 ENCOUNTER — Encounter: Payer: Self-pay | Admitting: Gastroenterology

## 2014-06-30 NOTE — Progress Notes (Signed)
   Subjective:    Patient ID: [name], female    DOB: 1960/12/14, 53 y.o.   MRN: [id number]  HPI   Past Medical History  Diagnosis Date  . S/P colonoscopy 06/02/09    normal ([name])  . PUD (peptic ulcer disease) 01/2007    EGD Dr [name], 2 antral ulcers, negative h pylori  . MRSA infection     Dr. [name], currently under treatment  . Hiatal hernia 2008    on EGD above  . Asthma   . PTSD (post-traumatic stress disorder)   . Migraines   . Degenerative disc disease   . Rectal prolapse   . SBO (small bowel obstruction)   . ADHD (attention deficit hyperactivity disorder)   . S/P endoscopy 03/26/11    gastritis-Dr [name]  . Clostridium difficile colitis 04/10/11 &11/2011    tx w/ flagyl  . Chronic abdominal pain 2003    EX LAP APR 2004 RUPTURE L OV CYST, JUL 2004 ADHESIONS  . Irritable bowel syndrome 2004 CONSTIPATION  . BMI (body mass index) 20.0-29.9 2009 127 LBS  . COPD (chronic obstructive pulmonary disease)   . Chronic nausea   . Anginal pain     Review of Systems     Objective:   Physical Exam        Assessment & Plan:

## 2014-07-02 ENCOUNTER — Other Ambulatory Visit: Payer: PRIVATE HEALTH INSURANCE

## 2014-07-02 LAB — URINE CULTURE

## 2014-07-04 ENCOUNTER — Telehealth (HOSPITAL_BASED_OUTPATIENT_CLINIC_OR_DEPARTMENT_OTHER): Payer: Self-pay

## 2014-07-04 NOTE — Telephone Encounter (Signed)
Post ED Visit - Positive Culture Follow-up  Culture report reviewed by antimicrobial stewardship pharmacist: []  Wes [place], Pharm.D., BCPS []  Heide Guile, Pharm.D., BCPS []  Alycia Rossetti, Pharm.D., BCPS []  [place], [place].D., BCPS, AAHIVP []  Legrand Como, Pharm.D., BCPS, AAHIVP [x]  Carly Sabat, Pharm.D. []  Elenor Quinones, Pharm.D.  Positive Urine culture, >/= 100,000 colonies -> E. Coli Treated with Ciprofloxacin, organism sensitive to the same and no further patient follow-up is required at this time.  Dortha Kern 07/04/2014, 12:51 AM

## 2014-07-05 ENCOUNTER — Other Ambulatory Visit: Payer: Self-pay | Admitting: Gastroenterology

## 2014-08-09 ENCOUNTER — Encounter (HOSPITAL_COMMUNITY): Payer: Self-pay | Admitting: Emergency Medicine

## 2014-12-07 ENCOUNTER — Encounter (HOSPITAL_COMMUNITY): Payer: Self-pay | Admitting: Neurology

## 2014-12-07 ENCOUNTER — Emergency Department (HOSPITAL_COMMUNITY)
Admission: EM | Admit: 2014-12-07 | Discharge: 2014-12-07 | Disposition: A | Discharge status: Home / Self Care | Payer: Medicare Other | Attending: Emergency Medicine | Admitting: Emergency Medicine

## 2014-12-07 DIAGNOSIS — F4310 Post-traumatic stress disorder, unspecified: Secondary | ICD-10-CM | POA: Diagnosis not present

## 2014-12-07 DIAGNOSIS — J449 Chronic obstructive pulmonary disease, unspecified: Secondary | ICD-10-CM | POA: Insufficient documentation

## 2014-12-07 DIAGNOSIS — Z792 Long term (current) use of antibiotics: Secondary | ICD-10-CM | POA: Diagnosis not present

## 2014-12-07 DIAGNOSIS — Z79899 Other long term (current) drug therapy: Secondary | ICD-10-CM | POA: Insufficient documentation

## 2014-12-07 DIAGNOSIS — Z8711 Personal history of peptic ulcer disease: Secondary | ICD-10-CM | POA: Diagnosis not present

## 2014-12-07 DIAGNOSIS — Z8614 Personal history of Methicillin resistant Staphylococcus aureus infection: Secondary | ICD-10-CM | POA: Diagnosis not present

## 2014-12-07 DIAGNOSIS — Z880 Allergy status to penicillin: Secondary | ICD-10-CM | POA: Insufficient documentation

## 2014-12-07 DIAGNOSIS — G43909 Migraine, unspecified, not intractable, without status migrainosus: Secondary | ICD-10-CM | POA: Insufficient documentation

## 2014-12-07 DIAGNOSIS — F909 Attention-deficit hyperactivity disorder, unspecified type: Secondary | ICD-10-CM | POA: Diagnosis not present

## 2014-12-07 DIAGNOSIS — Z8719 Personal history of other diseases of the digestive system: Secondary | ICD-10-CM | POA: Diagnosis not present

## 2014-12-07 DIAGNOSIS — G8929 Other chronic pain: Secondary | ICD-10-CM

## 2014-12-07 DIAGNOSIS — R109 Unspecified abdominal pain: Secondary | ICD-10-CM

## 2014-12-07 DIAGNOSIS — Z720 Tobacco use: Secondary | ICD-10-CM | POA: Insufficient documentation

## 2014-12-07 DIAGNOSIS — J45909 Unspecified asthma, uncomplicated: Secondary | ICD-10-CM | POA: Insufficient documentation

## 2014-12-07 DIAGNOSIS — Z8619 Personal history of other infectious and parasitic diseases: Secondary | ICD-10-CM | POA: Diagnosis not present

## 2014-12-07 DIAGNOSIS — N390 Urinary tract infection, site not specified: Secondary | ICD-10-CM | POA: Diagnosis not present

## 2014-12-07 DIAGNOSIS — R1084 Generalized abdominal pain: Secondary | ICD-10-CM | POA: Diagnosis present

## 2014-12-07 DIAGNOSIS — Z8739 Personal history of other diseases of the musculoskeletal system and connective tissue: Secondary | ICD-10-CM | POA: Diagnosis not present

## 2014-12-07 LAB — COMPREHENSIVE METABOLIC PANEL
ALT: 27 U/L (ref 0–35)
ANION GAP: 6 (ref 5–15)
AST: 46 U/L — ABNORMAL HIGH (ref 0–37)
Albumin: 3.7 g/dL (ref 3.5–5.2)
Alkaline Phosphatase: 93 U/L (ref 39–117)
BUN: 14 mg/dL (ref 6–23)
CALCIUM: 8.8 mg/dL (ref 8.4–10.5)
CO2: 25 mmol/L (ref 19–32)
Chloride: 107 mmol/L (ref 96–112)
Creatinine, Ser: 0.73 mg/dL (ref 0.50–1.10)
GFR calc non Af Amer: >90 mL/min (ref >90)
GLUCOSE: 94 mg/dL (ref 70–99)
Potassium: 4.9 mmol/L (ref 3.5–5.1)
SODIUM: 138 mmol/L (ref 135–145)
Total Bilirubin: 1.4 mg/dL — ABNORMAL HIGH (ref 0.3–1.2)
Total Protein: 6.2 g/dL (ref 6.0–8.3)

## 2014-12-07 LAB — CBC WITH DIFFERENTIAL/PLATELET
BASOS ABS: 0.1 10*3/uL (ref 0.0–0.1)
Basophils Relative: 1 % (ref 0–1)
Eosinophils Absolute: 0.4 10*3/uL (ref 0.0–0.7)
Eosinophils Relative: 5 % (ref 0–5)
HCT: 40.8 % (ref 36.0–46.0)
Hemoglobin: 13.4 g/dL (ref 12.0–15.0)
Lymphocytes Relative: 28 % (ref 12–46)
Lymphs Abs: 2.3 10*3/uL (ref 0.7–4.0)
MCH: 30.4 pg (ref 26.0–34.0)
MCHC: 32.8 g/dL (ref 30.0–36.0)
MCV: 92.5 fL (ref 78.0–100.0)
MONO ABS: 0.6 10*3/uL (ref 0.1–1.0)
Monocytes Relative: 7 % (ref 3–12)
Neutro Abs: 4.7 10*3/uL (ref 1.7–7.7)
Neutrophils Relative %: 59 % (ref 43–77)
PLATELETS: ADEQUATE 10*3/uL (ref 150–400)
RBC: 4.41 MIL/uL (ref 3.87–5.11)
RDW: 13.6 % (ref 11.5–15.5)
WBC: 8.1 10*3/uL (ref 4.0–10.5)

## 2014-12-07 LAB — URINALYSIS, ROUTINE W REFLEX MICROSCOPIC
Bilirubin Urine: NEGATIVE
Glucose, UA: NEGATIVE mg/dL
Ketones, ur: NEGATIVE mg/dL
NITRITE: POSITIVE — AB
Protein, ur: NEGATIVE mg/dL
SPECIFIC GRAVITY, URINE: 1.011 (ref 1.005–1.030)
Urobilinogen, UA: 0.2 mg/dL (ref 0.0–1.0)
pH: 6 (ref 5.0–8.0)

## 2014-12-07 LAB — URINE MICROSCOPIC-ADD ON

## 2014-12-07 LAB — LIPASE, BLOOD: LIPASE: 23 U/L (ref 11–59)

## 2014-12-07 MED ORDER — HYDROMORPHONE HCL 1 MG/ML IJ SOLN
1.0000 mg | Freq: Once | INTRAMUSCULAR | Status: AC
  Administered 2014-12-07: 1 mg via INTRAVENOUS
  Filled 2014-12-07: qty 1

## 2014-12-07 MED ORDER — CIPROFLOXACIN HCL 500 MG PO TABS: 500.0000 mg | ORAL_TABLET | Freq: Two times a day (BID) | ORAL | Status: DC

## 2014-12-07 MED ORDER — ONDANSETRON HCL 4 MG/2ML IJ SOLN
4.0000 mg | Freq: Once | INTRAMUSCULAR | Status: AC
  Administered 2014-12-07: 4 mg via INTRAVENOUS
  Filled 2014-12-07: qty 2

## 2014-12-07 MED ORDER — SODIUM CHLORIDE 0.9 % IV BOLUS (SEPSIS)
1000.0000 mL | Freq: Once | INTRAVENOUS | Status: AC
  Administered 2014-12-07: 1000 mL via INTRAVENOUS

## 2014-12-07 MED ORDER — CIPROFLOXACIN IN D5W 400 MG/200ML IV SOLN
400.0000 mg | Freq: Once | INTRAVENOUS | Status: AC
  Administered 2014-12-07: 400 mg via INTRAVENOUS
  Filled 2014-12-07: qty 200

## 2014-12-07 MED ORDER — SODIUM CHLORIDE 0.9 % IV BOLUS (SEPSIS)
1000.0000 mL | Freq: Once | INTRAVENOUS | Status: AC
Start: 2014-12-07 — End: 2014-12-07
  Administered 2014-12-07: 1000 mL via INTRAVENOUS

## 2014-12-07 NOTE — ED Provider Notes (Signed)
CSN: [id number]     Arrival date & time 12/07/14  1611 History   First MD Initiated Contact with Patient 12/07/14 1641     Chief Complaint  Patient presents with  . Abdominal Pain   (Consider location/radiation/quality/duration/timing/severity/associated sxs/prior Treatment) Patient is a 54 y.o. female presenting with abdominal pain. No language interpreter was used.  Abdominal Pain Pain location:  Generalized Pain quality: aching and bloating   Pain radiation: generalized. Pain severity:  Moderate Onset quality:  Gradual Duration:  3 days Timing:  Intermittent Progression:  Unchanged Chronicity:  Recurrent Context: previous surgery   Context: not alcohol use, not diet changes, not eating, not laxative use, not recent illness, not recent sexual activity, not retching, not sick contacts, not suspicious food intake and not trauma   Relieved by:  Nothing Worsened by:  Nothing tried Ineffective treatments:  OTC medications and position changes Associated symptoms: nausea   Associated symptoms: no belching, no chest pain, no chills, no constipation, no cough, no diarrhea, no dysuria, no fever, no hematemesis, no hematochezia, no hematuria, no melena, no shortness of breath, no vaginal bleeding, no vaginal discharge and no vomiting   Nausea:    Severity:  Mild   Onset quality:  Gradual   Duration:  3 days   Timing:  Intermittent   Progression:  Unchanged Risk factors: multiple surgeries   Risk factors: no alcohol abuse, not elderly, no NSAID use, not obese, not pregnant and no recent hospitalization     Past Medical History  Diagnosis Date  . S/P colonoscopy 06/02/09    normal ([name])  . PUD (peptic ulcer disease) 01/2007    EGD Dr [name], 2 antral ulcers, negative h pylori  . MRSA infection     Dr. [name], currently under treatment  . Hiatal hernia 2008    on EGD above  . Asthma   . PTSD (post-traumatic stress disorder)   . Migraines   . Degenerative disc  disease   . Rectal prolapse   . SBO (small bowel obstruction)   . ADHD (attention deficit hyperactivity disorder)   . S/P endoscopy 03/26/11    gastritis-Dr [name]  . Clostridium difficile colitis 04/10/11 &11/2011    tx w/ flagyl  . Chronic abdominal pain 2003    EX LAP APR 2004 RUPTURE L OV CYST, JUL 2004 ADHESIONS  . Irritable bowel syndrome 2004 CONSTIPATION  . BMI (body mass index) 20.0-29.9 2009 127 LBS  . COPD (chronic obstructive pulmonary disease)   . Chronic nausea   . Anginal pain    Past Surgical History  Procedure Laterality Date  . Bowel resection      x2, secondary to adhesions  . Appendectomy    . Laparoscopic lysis intestinal adhesions    . Back surgery    . Abdominal hysterectomy    . Tubal ligation    . Umbilical hernia repair  2008    x5  . Neck surgery  2009  . Partial hysterectomy    . Upper gastrointestinal endoscopy  APR 2008 [facility] BLEEDING/PAIN    PUD  . Upper gastrointestinal endoscopy  JUL 2012 [facility] PAIN    MILD GASTRITIS  . Esophagogastroduodenoscopy  03/23/2011     Normal esophagus without evidence of Barrett's, mass, erosions, or ulcerations./ Patchy erythema with occasional erosion in the antrum.  Biopsies  obtained via cold forceps to evaluate for H. pylori gastritis/ Small hiatal hernia./Normal duodenal bulb and second portion of the duodenum.  . Cervical spine surgery    .  Flexible sigmoidoscopy  07/14/2002    Normal limited flexible sigmoidoscopy with stool in the rectum and rectosigmoid precluding a full colonoscopy  . Colonoscopy  03/21/2012     QVZ:DGLOVF ADENOMA(1) POOR PREP  . Esophagogastroduodenoscopy  03/21/2012    IEP:PIRJJ Hiatal hernia/ABDOMINALPAIN/DIARRHEA MOST LIKELY DUE TO IBS, GASTRITIS DUODENITIS  . Colonoscopy N/A 11/17/2013    OAC:ZYSAYT mucosa in the terminal ileum/NORMAL surgical anastomosis/Small internal hemorrhoids   Family History  Problem Relation Age of Onset  . Adopted: Yes   History  Substance Use Topics  .  Smoking status: Current Every Day Smoker -- 0.10 packs/day for 10 years    Types: Cigarettes  . Smokeless tobacco: Former Systems developer  . Alcohol Use: No   OB History    Gravida Para Term Preterm AB TAB SAB Ectopic Multiple Living   1 1 1       1      Review of Systems  Constitutional: Negative for fever and chills.  Respiratory: Negative for cough and shortness of breath.   Cardiovascular: Negative for chest pain.  Gastrointestinal: Positive for nausea and abdominal pain. Negative for vomiting, diarrhea, constipation, melena, hematochezia and hematemesis.  Genitourinary: Negative for dysuria, hematuria, vaginal bleeding and vaginal discharge.  Musculoskeletal: Negative for myalgias.  Skin: Negative for rash.  Neurological: Negative for dizziness, weakness, light-headedness, numbness and headaches.  Hematological: Negative for adenopathy. Does not bruise/bleed easily.  All other systems reviewed and are negative.     Allergies  Penicillins; Sulfa antibiotics; Aspirin; Ibuprofen; Lactose intolerance (gi); and Morphine and related  Home Medications   Prior to Admission medications   Medication Sig Start Date End Date Taking? Authorizing Provider  albuterol (PROVENTIL,VENTOLIN) 90 MCG/ACT inhaler Inhale 2 puffs into the lungs every 6 (six) hours as needed. For shortness of breath   Yes Historical Provider, MD  ALPRAZolam Duanne Moron) 1 MG tablet Take 1-2 mg by mouth 4 (four) times daily. Patient takes 1 tablet twice a day and 2 tablets at bedtime   Yes Historical Provider, MD  amphetamine-dextroamphetamine (ADDERALL XR) 15 MG 24 hr capsule Take 15 mg by mouth 2 (two) times daily.    Yes Historical Provider, MD  cyclobenzaprine (FLEXERIL) 5 MG tablet Take 5 mg by mouth every 8 (eight) hours as needed for muscle spasms.   Yes Historical Provider, MD  dicyclomine (BENTYL) 10 MG capsule Take 1 capsule (10 mg total) by mouth 3 (three) times daily as needed for spasms. 07/06/14  Yes [name], PA-C   DULoxetine (CYMBALTA) 60 MG capsule Take 120 mg by mouth every morning.    Yes Historical Provider, MD  esomeprazole (NEXIUM) 40 MG capsule Take 1 capsule (40 mg total) by mouth 2 (two) times daily before a meal. 05/17/14  Yes [name], PA-C  gabapentin (NEURONTIN) 300 MG capsule Take 300-900 mg by mouth 2 (two) times daily. 1 capsule daily and 3 capsules at bedtime   Yes Historical Provider, MD  HYDROcodone-acetaminophen (NORCO) 10-325 MG per tablet Take 1 tablet by mouth every 4 (four) hours as needed. pain 09/02/13  Yes Historical Provider, MD  mirtazapine (REMERON) 15 MG tablet Take 1 tablet by mouth at bedtime as needed (for rest).  08/17/13  Yes Historical Provider, MD  montelukast (SINGULAIR) 10 MG tablet Take 10 mg by mouth daily.  01/14/12  Yes Historical Provider, MD  promethazine (PHENERGAN) 25 MG suppository Place 25 mg rectally every 6 (six) hours as needed for nausea or vomiting.   Yes Historical Provider, MD  ciprofloxacin (  CIPRO) 500 MG tablet Take 1 tablet (500 mg total) by mouth 2 (two) times daily. Patient not taking: Reported on 12/07/2014 06/28/14   Glendell Docker, NP  docusate sodium 100 MG CAPS Take 100 mg by mouth 2 (two) times daily. Patient not taking: Reported on 12/07/2014 08/22/13   [name], MD   BP 128/46 mmHg  Pulse 89  Temp(Src) 98.8 F (37.1 C) (Oral)  Resp 18  Ht 5\' 3"  (1.6 m)  Wt 136 lb (61.689 kg)  BMI 24.10 kg/m2  SpO2 98% Physical Exam  Constitutional: She is oriented to person, place, and time. She appears well-developed and well-nourished.  HENT:  Head: Normocephalic and atraumatic.  Right Ear: External ear normal.  Left Ear: External ear normal.  Mouth/Throat: Oropharynx is clear and moist.  Eyes: Conjunctivae are normal. Pupils are equal, round, and reactive to light.  Neck: Normal range of motion. Neck supple.  Cardiovascular: Normal rate, regular rhythm, normal heart sounds and intact distal pulses.   Pulmonary/Chest: Effort  normal and breath sounds normal. No respiratory distress. She has no wheezes. She has no rales. She exhibits no tenderness.  Abdominal: Soft. Bowel sounds are normal. She exhibits no distension and no mass. There is tenderness. There is no rebound and no guarding.  Generalized TTP, soft, no rebound, no guarding  Musculoskeletal: Normal range of motion.  Neurological: She is alert and oriented to person, place, and time.  Skin: Skin is warm and dry.  Nursing note and vitals reviewed.   ED Course  Procedures (including critical care time) Labs Review Labs Reviewed  COMPREHENSIVE METABOLIC PANEL - Abnormal; Notable for the following:    AST 46 (*)    Total Bilirubin 1.4 (*)    All other components within normal limits  URINALYSIS, ROUTINE W REFLEX MICROSCOPIC - Abnormal; Notable for the following:    Hgb urine dipstick SMALL (*)    Nitrite POSITIVE (*)    Leukocytes, UA MODERATE (*)    All other components within normal limits  URINE MICROSCOPIC-ADD ON - Abnormal; Notable for the following:    Bacteria, UA MANY (*)    All other components within normal limits  URINE CULTURE  CBC WITH DIFFERENTIAL/PLATELET  LIPASE, BLOOD    Imaging Review No results found.   EKG Interpretation None      MDM   Final diagnoses:  UTI (lower urinary tract infection)  Chronic abdominal pain   54 yo F hx of PUD, Hiatal hernia, IBS, prior abdominal surgeries (SBO, ex lap L ovarian cyst), COPD, presents with CC abdominal pain.    Physical exam as above.  VS WNL, afebrile.    Pt with generalized pain on exam, with otherwise soft abdomen, no rebound, no guarding.  During periods of history taking, pt does not seem in pain.    Will obtain labs and reeval.  NS bolus, dilaudid, zofran for symptoms.   CBC, CMP, Lipase are all WNL.  UA positive for leukocytes, nitrites, many bacteria.    Pt with likely UTI.  Pt still c/o pain, and given 2nd dose of pain meds, along with Ciprofloxacin for her UTI  (previous urine Cx E. Coli, sensitive to cipro).   After pain meds, pt with mild hypotension.  Pt given NS bolus, will reeval, will hold off on any other pain meds at this time.  No need for CT imaging at this time, as pain likely from UTI, and element of chronic abdominal pain, as labs and exam are reassuring.  BP responded to IVF, came back up to 102/65.  Pt is feeling better.  Okay for discharge home.   D/c home in good condition.  Rx Cipro for UTI.  F/u with PCP in 1 week.  Return precautions given.  Pt understands and agrees with plan.   [name]  Discussed pt with attending Dr. Ashok Cordia.     [name], MD 12/08/14 0028  Mirna Mires, MD 12/09/14 [phone]

## 2014-12-07 NOTE — ED Notes (Signed)
This Rn placed pt on 2L nasal cannula, pt currently sleepy but easy to arouse and alert and oriented. Pt oxygen saturation improved to 96%.

## 2014-12-07 NOTE — ED Notes (Signed)
Pt states she is lactose intolerate and ate icecream with pecans last night and has had stomach pain gradually getting worse.  Pt states she went to the bathroom and had a "squirt of clear bowel movement".  Pt states she is passing gas and feels bloated.

## 2014-12-07 NOTE — ED Notes (Signed)
Asked pt for urine sample and she asked for time to urinate.

## 2014-12-07 NOTE — ED Notes (Signed)
MD at bedside. 

## 2014-12-07 NOTE — Discharge Instructions (Signed)

## 2014-12-07 NOTE — ED Notes (Signed)
Pt reports mid abdominal pain radiating to right and left side since last night. Last night had ice-cream with almonds that she thinks upset her stomach. Has nausea and diarrhea last night after eating ice cream. Pt is a x 4

## 2014-12-09 LAB — URINE CULTURE: Colony Count: >=100000

## 2014-12-10 ENCOUNTER — Telehealth (HOSPITAL_BASED_OUTPATIENT_CLINIC_OR_DEPARTMENT_OTHER): Payer: Self-pay | Admitting: Emergency Medicine

## 2014-12-10 NOTE — Telephone Encounter (Signed)
Post ED Visit - Positive Culture Follow-up  Culture report reviewed by antimicrobial stewardship pharmacist: []  Wes [place], Pharm.D., BCPS [x]  Heide Guile, Pharm.D., BCPS []  Alycia Rossetti, Pharm.D., BCPS []  [place], Pharm.D., BCPS, AAHIVP []  Legrand Como, Pharm.D., BCPS, AAHIVP []  Isac Sarna, Pharm.D., BCPS  Positive urine culture E. Coli Treated with ciprofloxacin, organism sensitive to the same and no further patient follow-up is required at this time.  Hazle Nordmann 12/10/2014, 11:13 AM

## 2015-01-05 ENCOUNTER — Emergency Department (HOSPITAL_COMMUNITY)
Admission: EM | Admit: 2015-01-05 | Discharge: 2015-01-05 | Disposition: A | Discharge status: Home / Self Care | Payer: Medicare Other | Attending: Emergency Medicine | Admitting: Emergency Medicine

## 2015-01-05 ENCOUNTER — Emergency Department (HOSPITAL_COMMUNITY): Payer: Medicare Other

## 2015-01-05 ENCOUNTER — Encounter (HOSPITAL_COMMUNITY): Payer: Self-pay | Admitting: Emergency Medicine

## 2015-01-05 DIAGNOSIS — Z8711 Personal history of peptic ulcer disease: Secondary | ICD-10-CM | POA: Insufficient documentation

## 2015-01-05 DIAGNOSIS — Z8739 Personal history of other diseases of the musculoskeletal system and connective tissue: Secondary | ICD-10-CM | POA: Diagnosis not present

## 2015-01-05 DIAGNOSIS — Y9289 Other specified places as the place of occurrence of the external cause: Secondary | ICD-10-CM | POA: Diagnosis not present

## 2015-01-05 DIAGNOSIS — G8929 Other chronic pain: Secondary | ICD-10-CM

## 2015-01-05 DIAGNOSIS — Y998 Other external cause status: Secondary | ICD-10-CM | POA: Diagnosis not present

## 2015-01-05 DIAGNOSIS — Y9389 Activity, other specified: Secondary | ICD-10-CM | POA: Diagnosis not present

## 2015-01-05 DIAGNOSIS — K589 Irritable bowel syndrome without diarrhea: Secondary | ICD-10-CM | POA: Diagnosis not present

## 2015-01-05 DIAGNOSIS — J449 Chronic obstructive pulmonary disease, unspecified: Secondary | ICD-10-CM | POA: Insufficient documentation

## 2015-01-05 DIAGNOSIS — S24109A Unspecified injury at unspecified level of thoracic spinal cord, initial encounter: Secondary | ICD-10-CM | POA: Insufficient documentation

## 2015-01-05 DIAGNOSIS — Z880 Allergy status to penicillin: Secondary | ICD-10-CM | POA: Diagnosis not present

## 2015-01-05 DIAGNOSIS — G43909 Migraine, unspecified, not intractable, without status migrainosus: Secondary | ICD-10-CM | POA: Diagnosis not present

## 2015-01-05 DIAGNOSIS — W228XXA Striking against or struck by other objects, initial encounter: Secondary | ICD-10-CM | POA: Insufficient documentation

## 2015-01-05 DIAGNOSIS — F909 Attention-deficit hyperactivity disorder, unspecified type: Secondary | ICD-10-CM | POA: Insufficient documentation

## 2015-01-05 DIAGNOSIS — F4310 Post-traumatic stress disorder, unspecified: Secondary | ICD-10-CM | POA: Insufficient documentation

## 2015-01-05 DIAGNOSIS — Z720 Tobacco use: Secondary | ICD-10-CM | POA: Insufficient documentation

## 2015-01-05 DIAGNOSIS — I209 Angina pectoris, unspecified: Secondary | ICD-10-CM | POA: Diagnosis not present

## 2015-01-05 DIAGNOSIS — Z9889 Other specified postprocedural states: Secondary | ICD-10-CM | POA: Diagnosis not present

## 2015-01-05 DIAGNOSIS — Z8619 Personal history of other infectious and parasitic diseases: Secondary | ICD-10-CM | POA: Diagnosis not present

## 2015-01-05 DIAGNOSIS — Z79899 Other long term (current) drug therapy: Secondary | ICD-10-CM | POA: Diagnosis not present

## 2015-01-05 DIAGNOSIS — M549 Dorsalgia, unspecified: Secondary | ICD-10-CM

## 2015-01-05 DIAGNOSIS — Z8614 Personal history of Methicillin resistant Staphylococcus aureus infection: Secondary | ICD-10-CM | POA: Insufficient documentation

## 2015-01-05 MED ORDER — OXYCODONE-ACETAMINOPHEN 5-325 MG PO TABS
2.0000 | ORAL_TABLET | Freq: Once | ORAL | Status: AC
  Administered 2015-01-05: 2 via ORAL
  Filled 2015-01-05: qty 2

## 2015-01-05 MED ORDER — KETOROLAC TROMETHAMINE 60 MG/2ML IM SOLN
60.0000 mg | Freq: Once | INTRAMUSCULAR | Status: DC
  Filled 2015-01-05: qty 2

## 2015-01-05 NOTE — ED Notes (Signed)
Pt refused toradol, insisted that she is taking it and pulled out her Tramadol, explained to pt that EDP is aware that she is taking Tramadol and ordered Toradol IM, pt refused and kept saying " I need something stronger", EDP made aware, also patient stated that "I should've just went to [facility]" and spoke to her friend in room "we are going to [place]"
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
## 2015-01-05 NOTE — Discharge Instructions (Signed)
Please take your ultram as prescribed by your primary care physician for your pain.  If you feel you need stronger pain medication and this is something you need to discuss with your primary care physician who is managing her chronic pain. The emergency department is not the appropriate place to treat chronic pain.   Back Pain, Adult Low back pain is very common. About 1 in 5 people have back pain.The cause of low back pain is rarely dangerous. The pain often gets better over time.About half of people with a sudden onset of back pain feel better in just 2 weeks. About 8 in 10 people feel better by 6 weeks.  CAUSES Some common causes of back pain include:  Strain of the muscles or ligaments supporting the spine.  Wear and tear (degeneration) of the spinal discs.  Arthritis.  Direct injury to the back. DIAGNOSIS Most of the time, the direct cause of low back pain is not known.However, back pain can be treated effectively even when the exact cause of the pain is unknown.Answering your caregiver's questions about your overall health and symptoms is one of the most accurate ways to make sure the cause of your pain is not dangerous. If your caregiver needs more information, he or she may order lab work or imaging tests (X-rays or MRIs).However, even if imaging tests show changes in your back, this usually does not require surgery. HOME CARE INSTRUCTIONS For many people, back pain returns.Since low back pain is rarely dangerous, it is often a condition that people can learn to [facility] their own.   Remain active. It is stressful on the back to sit or stand in one place. Do not sit, drive, or stand in one place for more than 30 minutes at a time. Take short walks on level surfaces as soon as pain allows.Try to increase the length of time you walk each day.  Do not stay in bed.Resting more than 1 or 2 days can delay your recovery.  Do not avoid exercise or work.Your body is made to move.It  is not dangerous to be active, even though your back may hurt.Your back will likely heal faster if you return to being active before your pain is gone.  Pay attention to your body when you bend and lift. Many people have less discomfortwhen lifting if they bend their knees, keep the load close to their bodies,and avoid twisting. Often, the most comfortable positions are those that put less stress on your recovering back.  Find a comfortable position to sleep. Use a firm mattress and lie on your side with your knees slightly bent. If you lie on your back, put a pillow under your knees.  Only take over-the-counter or prescription medicines as directed by your caregiver. Over-the-counter medicines to reduce pain and inflammation are often the most helpful.Your caregiver may prescribe muscle relaxant drugs.These medicines help dull your pain so you can more quickly return to your normal activities and healthy exercise.  Put ice on the injured area.  Put ice in a plastic bag.  Place a towel between your skin and the bag.  Leave the ice on for 15-20 minutes, 03-04 times a day for the first 2 to 3 days. After that, ice and heat may be alternated to reduce pain and spasms.  Ask your caregiver about trying back exercises and gentle massage. This may be of some benefit.  Avoid feeling anxious or stressed.Stress increases muscle tension and can worsen back pain.It is important to recognize  when you are anxious or stressed and learn ways to manage it.Exercise is a great option. SEEK MEDICAL CARE IF:  You have pain that is not relieved with rest or medicine.  You have pain that does not improve in 1 week.  You have new symptoms.  You are generally not feeling well. SEEK IMMEDIATE MEDICAL CARE IF:   You have pain that radiates from your back into your legs.  You develop new bowel or bladder control problems.  You have unusual weakness or numbness in your arms or legs.  You develop  nausea or vomiting.  You develop abdominal pain.  You feel faint. Document Released: 09/24/2005 Document Revised: 03/25/2012 Document Reviewed: 01/26/2014 [facility] Patient Information 2015 [place], [place]. This information is not intended to replace advice given to you by your health care provider. Make sure you discuss any questions you have with your health care provider.  Chronic Pain Chronic pain can be defined as pain that is off and on and lasts for 3-6 months or longer. Many things cause chronic pain, which can make it difficult to make a diagnosis. There are many treatment options available for chronic pain. However, finding a treatment that works well for you may require trying various approaches until the right one is found. Many people benefit from a combination of two or more types of treatment to control their pain. SYMPTOMS  Chronic pain can occur anywhere in the body and can range from mild to very severe. Some types of chronic pain include:  Headache.  Low back pain.  Cancer pain.  Arthritis pain.  Neurogenic pain. This is pain resulting from damage to nerves. People with chronic pain may also have other symptoms such as:  Depression.  Anger.  Insomnia.  Anxiety. DIAGNOSIS  Your health care provider will help diagnose your condition over time. In many cases, the initial focus will be on excluding possible conditions that could be causing the pain. Depending on your symptoms, your health care provider may order tests to diagnose your condition. Some of these tests may include:   Blood tests.   CT scan.   MRI.   X-rays.   Ultrasounds.   Nerve conduction studies.  You may need to see a specialist.  TREATMENT  Finding treatment that works well may take time. You may be referred to a pain specialist. He or she may prescribe medicine or therapies, such as:   Mindful meditation or yoga.  Shots (injections) of numbing or pain-relieving medicines into  the spine or area of pain.  Local electrical stimulation.  Acupuncture.   Massage therapy.   Aroma, color, light, or sound therapy.   Biofeedback.   Working with a physical therapist to keep from getting stiff.   Regular, gentle exercise.   Cognitive or behavioral therapy.   Group support.  Sometimes, surgery may be recommended.  HOME CARE INSTRUCTIONS   Take all medicines as directed by your health care provider.   Lessen stress in your life by relaxing and doing things such as listening to calming music.   Exercise or be active as directed by your health care provider.   Eat a healthy diet and include things such as vegetables, fruits, fish, and lean meats in your diet.   Keep all follow-up appointments with your health care provider.   Attend a support group with others suffering from chronic pain. SEEK MEDICAL CARE IF:   Your pain gets worse.   You develop a new pain that was not there  before.   You cannot tolerate medicines given to you by your health care provider.   You have new symptoms since your last visit with your health care provider.  SEEK IMMEDIATE MEDICAL CARE IF:   You feel weak.   You have decreased sensation or numbness.   You lose control of bowel or bladder function.   Your pain suddenly gets much worse.   You develop shaking.  You develop chills.  You develop confusion.  You develop chest pain.  You develop shortness of breath.  MAKE SURE YOU:  Understand these instructions.  Will watch your condition.  Will get help right away if you are not doing well or get worse. Document Released: 06/16/2002 Document Revised: 05/27/2013 Document Reviewed: 03/20/2013 [facility] Patient Information 2015 [place], [place]. This information is not intended to replace advice given to you by your health care provider. Make sure you discuss any questions you have with your health care provider.

## 2015-01-05 NOTE — ED Notes (Signed)
Pt left without receiving d/c instructions, unable to review with patient and unable to assess pain level at present

## 2015-01-05 NOTE — ED Provider Notes (Signed)
This chart was scribed for Nyra Jabs, DO by Einar Pheasant, Medical Scribe. This patient was seen in room APA12/APA12 and the patient's care was started at 3:04 PM.  CHIEF COMPLAINT:  Chief Complaint  Patient presents with  . Back Pain    HPI:  HPI Comments: [name] is a 54 y.o. female with PMhx of severe osteoarthritis and previous neck and back surgery presents to the Emergency Department complaining of sudden onset back pain that started last night after getting hit in her back by a door at the grocery store. She states that following the incident she had to lean on something and wait for the pain to resolve. However, this morning at 2 o' clock she woke up with severe thoracic pain. Baseline lower back pain endorsed. She denies any falls, head trauma, or LOC. Pain is exacerbated by movement. She admits to taking Ultram for her chronic pain. Pt denies any fever, chills, numbness, weakness, or bladder incontinence. States she was previously seen by pain management clinic but is now having her chronic pain managed by her primary care physician. States her primary care physician is trying to get her into a pain clinic.  ROS: See HPI Constitutional: no fever  Eyes: no drainage  ENT: no runny nose   Cardiovascular:  no chest pain  Resp: no SOB  GI: no vomiting GU: no dysuria Integumentary: no rash  Allergy: no hives  Musculoskeletal: no leg swelling, positive back pain Neurological: no slurred speech ROS otherwise negative  PAST MEDICAL HISTORY/PAST SURGICAL HISTORY:  Past Medical History  Diagnosis Date  . S/P colonoscopy 06/02/09    normal ([name])  . PUD (peptic ulcer disease) 01/2007    EGD Dr [name], 2 antral ulcers, negative h pylori  . MRSA infection     Dr. [name], currently under treatment  . Hiatal hernia 2008    on EGD above  . Asthma   . PTSD (post-traumatic stress disorder)   . Migraines   . Degenerative disc disease   . Rectal prolapse   .  SBO (small bowel obstruction)   . ADHD (attention deficit hyperactivity disorder)   . S/P endoscopy 03/26/11    gastritis-Dr [name]  . Clostridium difficile colitis 04/10/11 &11/2011    tx w/ flagyl  . Chronic abdominal pain 2003    EX LAP APR 2004 RUPTURE L OV CYST, JUL 2004 ADHESIONS  . Irritable bowel syndrome 2004 CONSTIPATION  . BMI (body mass index) 20.0-29.9 2009 127 LBS  . COPD (chronic obstructive pulmonary disease)   . Chronic nausea   . Anginal pain     MEDICATIONS:  Prior to Admission medications   Medication Sig Start Date End Date Taking? Authorizing Provider  albuterol (PROVENTIL,VENTOLIN) 90 MCG/ACT inhaler Inhale 2 puffs into the lungs every 6 (six) hours as needed. For shortness of breath    Historical Provider, MD  ALPRAZolam Duanne Moron) 1 MG tablet Take 1-2 mg by mouth 4 (four) times daily. Patient takes 1 tablet twice a day and 2 tablets at bedtime    Historical Provider, MD  amphetamine-dextroamphetamine (ADDERALL XR) 15 MG 24 hr capsule Take 15 mg by mouth 2 (two) times daily.     Historical Provider, MD  ciprofloxacin (CIPRO) 500 MG tablet Take 1 tablet (500 mg total) by mouth every 12 (twelve) hours. 12/07/14   [name], MD  cyclobenzaprine (FLEXERIL) 5 MG tablet Take 5 mg by mouth every 8 (eight) hours as needed for muscle spasms.  Historical Provider, MD  dicyclomine (BENTYL) 10 MG capsule Take 1 capsule (10 mg total) by mouth 3 (three) times daily as needed for spasms. 07/06/14   [name], PA-C  docusate sodium 100 MG CAPS Take 100 mg by mouth 2 (two) times daily. Patient not taking: Reported on 12/07/2014 08/22/13   [name], MD  DULoxetine (CYMBALTA) 60 MG capsule Take 120 mg by mouth every morning.     Historical Provider, MD  esomeprazole (NEXIUM) 40 MG capsule Take 1 capsule (40 mg total) by mouth 2 (two) times daily before a meal. 05/17/14   [name], PA-C  gabapentin (NEURONTIN) 300 MG capsule Take 300-900 mg by mouth 2 (two) times daily. 1  capsule daily and 3 capsules at bedtime    Historical Provider, MD  HYDROcodone-acetaminophen (NORCO) 10-325 MG per tablet Take 1 tablet by mouth every 4 (four) hours as needed. pain 09/02/13   Historical Provider, MD  mirtazapine (REMERON) 15 MG tablet Take 1 tablet by mouth at bedtime as needed (for rest).  08/17/13   Historical Provider, MD  montelukast (SINGULAIR) 10 MG tablet Take 10 mg by mouth daily.  01/14/12   Historical Provider, MD  promethazine (PHENERGAN) 25 MG suppository Place 25 mg rectally every 6 (six) hours as needed for nausea or vomiting.    Historical Provider, MD    ALLERGIES:  Allergies  Allergen Reactions  . Penicillins Anaphylaxis    REACTION: anaphylaxis  . Sulfa Antibiotics Itching  . Aspirin Other (See Comments)    Burning of stomach. REACTION: GI upset  . Ibuprofen Nausea Only    Upset stomach due to acid reflux  . Lactose Intolerance (Gi) Diarrhea  . Morphine And Related Itching    SOCIAL HISTORY:  History  Substance Use Topics  . Smoking status: Current Every Day Smoker -- 0.10 packs/day for 10 years    Types: Cigarettes  . Smokeless tobacco: Former Systems developer  . Alcohol Use: No    FAMILY HISTORY: Family History  Problem Relation Age of Onset  . Adopted: Yes    EXAM: BP 115/65 mmHg  Pulse 98  Temp(Src) 98.8 F (37.1 C) (Oral)  Resp 18  Ht 5\' 4"  (1.626 m)  Wt 146 lb (66.225 kg)  BMI 25.05 kg/m2  SpO2 95% CONSTITUTIONAL: Alert and oriented and responds appropriately to questions. Well-appearing; well-nourished HEAD: Normocephalic EYES: Conjunctivae clear, PERRL ENT: normal nose; no rhinorrhea; moist mucous membranes; pharynx without lesions noted NECK: Supple, no meningismus, no LAD  CARD: RRR; S1 and S2 appreciated; no murmurs, no clicks, no rubs, no gallops RESP: Normal chest excursion without splinting or tachypnea; breath sounds clear and equal bilaterally; no wheezes, no rhonchi, no rales,  ABD/GI: Normal bowel sounds; non-distended;  soft, non-tender, no rebound, no guarding BACK:  The back appears normal. Diffuse thoracic spinal tenderness without step-offs or deformity; there is no CVA tenderness EXT: Normal ROM in all joints; non-tender to palpation; no edema; normal capillary refill; no cyanosis   SKIN: Normal color for age and race; warm NEURO: Moves all extremities equally; sensation to light touch is grossly intact. Strength 5/5 to all four extremities.  PSYCH: The patient's mood and manner are appropriate. Grooming and personal hygiene are appropriate.  MEDICAL DECISION MAKING: Patient here with thoracic back pain after being hit in the back by a grocery store metal door yesterday. Was not knocked to the ground. Has no neurologic deficits. We'll obtain x-rays of the thoracic spine given she does have midline tenderness.  We'll provide Percocet in the emergency department but have discussed with patient given she is being treated for chronic pain by her primary care physician with Ultram but I will not be discharging her with a prescription for narcotics.  ED PROGRESS:   Patient's x-ray shows no acute injury. She reports no improvement after 2 Percocet tablets. Is requesting something stronger for pain. We have offered her IM Toradol but she refuses stating she needs something "stronger". Patient appears very comfortable on exam, has completely normal vital signs and I do not feel is clinically indicated with a negative x-ray to provide her with any stronger narcotic medication at this time. I feel there is some component of drug-seeking behavior and history of chronic back pain. Have discussed with patient at length that I recommend close follow-up with her primary care physician for monitoring and management of her chronic pain. Have also discussed with patient that the emergency department is not the appropriate place to have her chronic pain managed. Patient reports "I'm just going to another hospital in [place]" to nursing  staff. Discussed return precautions. Patient verbalized understanding.      I personally performed the services described in this documentation, which was scribed in my presence. The recorded information has been reviewed and is accurate.    [place], DO 01/05/15 1620

## 2015-01-17 ENCOUNTER — Encounter: Payer: Self-pay | Admitting: Gastroenterology

## 2015-02-04 ENCOUNTER — Ambulatory Visit: Payer: Medicaid Other | Admitting: Gastroenterology

## 2015-02-04 ENCOUNTER — Encounter: Payer: Self-pay | Admitting: Gastroenterology

## 2015-02-04 ENCOUNTER — Telehealth: Payer: Self-pay | Admitting: Gastroenterology

## 2015-02-04 NOTE — Telephone Encounter (Signed)
PATIENT WAS A NO SHOW AND LETTER WAS SENT  °

## 2015-02-07 ENCOUNTER — Encounter: Payer: Self-pay | Admitting: Gastroenterology

## 2015-03-01 ENCOUNTER — Ambulatory Visit: Payer: Medicare Other | Admitting: Gastroenterology

## 2015-03-01 ENCOUNTER — Telehealth: Payer: Self-pay | Admitting: Gastroenterology

## 2015-03-01 NOTE — Telephone Encounter (Signed)
Pt was a no show

## 2015-03-01 NOTE — Telephone Encounter (Signed)
Pt called and appologized for not showing. States she thinks she has MRSA again and is feeling weak. States that she has been having diarrhea for a few days and may go ER for Evaluation. Told pt that she needs to call and let us know before hand if she is not coming to appt. Told her not showing may result in dismissal from the practice. Sofie Rower is aware of pt calling.

## 2015-03-01 NOTE — Telephone Encounter (Signed)
Pt is aware to go to ER. States she will call back and reschedule her an appt.

## 2015-03-01 NOTE — Telephone Encounter (Signed)
No show X 2 since 02/04/15. Total of six no shows and/or cancelled appointments since 02/2014. Last seen in office 09/2013.  If she feels really weak, dehydrated, she can go to ER for evaluation.  I would recommend her keep appointment with [place] so we can evaluate her appropriately.

## 2015-03-08 ENCOUNTER — Encounter (HOSPITAL_COMMUNITY): Payer: Self-pay

## 2015-03-08 ENCOUNTER — Emergency Department (HOSPITAL_COMMUNITY)
Admission: EM | Admit: 2015-03-08 | Discharge: 2015-03-08 | Disposition: A | Discharge status: Home / Self Care | Payer: Medicare Other | Attending: Emergency Medicine | Admitting: Emergency Medicine

## 2015-03-08 DIAGNOSIS — F4310 Post-traumatic stress disorder, unspecified: Secondary | ICD-10-CM | POA: Insufficient documentation

## 2015-03-08 DIAGNOSIS — J449 Chronic obstructive pulmonary disease, unspecified: Secondary | ICD-10-CM | POA: Diagnosis not present

## 2015-03-08 DIAGNOSIS — Z880 Allergy status to penicillin: Secondary | ICD-10-CM | POA: Diagnosis not present

## 2015-03-08 DIAGNOSIS — K589 Irritable bowel syndrome without diarrhea: Secondary | ICD-10-CM | POA: Insufficient documentation

## 2015-03-08 DIAGNOSIS — G8929 Other chronic pain: Secondary | ICD-10-CM | POA: Diagnosis not present

## 2015-03-08 DIAGNOSIS — F909 Attention-deficit hyperactivity disorder, unspecified type: Secondary | ICD-10-CM | POA: Insufficient documentation

## 2015-03-08 DIAGNOSIS — Z79899 Other long term (current) drug therapy: Secondary | ICD-10-CM | POA: Diagnosis not present

## 2015-03-08 DIAGNOSIS — Z8614 Personal history of Methicillin resistant Staphylococcus aureus infection: Secondary | ICD-10-CM | POA: Insufficient documentation

## 2015-03-08 DIAGNOSIS — F419 Anxiety disorder, unspecified: Secondary | ICD-10-CM | POA: Diagnosis not present

## 2015-03-08 DIAGNOSIS — I209 Angina pectoris, unspecified: Secondary | ICD-10-CM | POA: Diagnosis not present

## 2015-03-08 DIAGNOSIS — R519 Headache, unspecified: Secondary | ICD-10-CM

## 2015-03-08 DIAGNOSIS — Z9889 Other specified postprocedural states: Secondary | ICD-10-CM | POA: Diagnosis not present

## 2015-03-08 DIAGNOSIS — Z720 Tobacco use: Secondary | ICD-10-CM | POA: Diagnosis not present

## 2015-03-08 DIAGNOSIS — G43909 Migraine, unspecified, not intractable, without status migrainosus: Secondary | ICD-10-CM | POA: Insufficient documentation

## 2015-03-08 DIAGNOSIS — Z8719 Personal history of other diseases of the digestive system: Secondary | ICD-10-CM | POA: Insufficient documentation

## 2015-03-08 DIAGNOSIS — Z8711 Personal history of peptic ulcer disease: Secondary | ICD-10-CM | POA: Diagnosis not present

## 2015-03-08 DIAGNOSIS — R51 Headache: Secondary | ICD-10-CM

## 2015-03-08 MED ORDER — HYDROMORPHONE HCL 1 MG/ML IJ SOLN
0.5000 mg | Freq: Once | INTRAMUSCULAR | Status: DC
  Filled 2015-03-08: qty 1

## 2015-03-08 MED ORDER — SODIUM CHLORIDE 0.9 % IV BOLUS (SEPSIS)
1000.0000 mL | Freq: Once | INTRAVENOUS | Status: AC
  Administered 2015-03-08: 1000 mL via INTRAVENOUS

## 2015-03-08 MED ORDER — HYDROMORPHONE HCL 1 MG/ML IJ SOLN
0.5000 mg | Freq: Once | INTRAMUSCULAR | Status: AC
Start: 2015-03-08 — End: 2015-03-08
  Administered 2015-03-08: 0.5 mg via INTRAMUSCULAR

## 2015-03-08 MED ORDER — DEXAMETHASONE SODIUM PHOSPHATE 4 MG/ML IJ SOLN
8.0000 mg | Freq: Once | INTRAMUSCULAR | Status: AC
  Administered 2015-03-08: 8 mg via INTRAVENOUS
  Filled 2015-03-08: qty 2

## 2015-03-08 MED ORDER — DIPHENHYDRAMINE HCL 50 MG/ML IJ SOLN
25.0000 mg | Freq: Once | INTRAMUSCULAR | Status: AC
  Administered 2015-03-08: 25 mg via INTRAVENOUS
  Filled 2015-03-08: qty 1

## 2015-03-08 MED ORDER — METOCLOPRAMIDE HCL 5 MG/ML IJ SOLN
10.0000 mg | Freq: Once | INTRAMUSCULAR | Status: AC
  Administered 2015-03-08: 10 mg via INTRAVENOUS
  Filled 2015-03-08: qty 2

## 2015-03-08 NOTE — ED Provider Notes (Signed)
CSN: [id number]     Arrival date & time 03/08/15  1214 History  This chart was scribed for Chelsea Manifold, MD by [name], ED Scribe. This patient was seen in room APA14/APA14 and the patient's care was started at 12:50 PM.    Chief Complaint  Patient presents with  . Migraine   The history is provided by the patient. No language interpreter was used.     HPI Comments: [name] is a 54 y.o. female who presents to the Emergency Department complaining of migraine x 1 day. She describes it as a throbbing sensation.  The pain is localized behind her right eye. She took Aspirin and Tramadol without relief. Pt also mentions having diarrhea and diaphoresis last night. She took Imodium with relief to her diarrhea but reports that the migraine has worsened since. Pt notes numbness to the right great toe that is new but she cannot say when it started. She mentions hx of similar headaches that occur when she uses her smart phone. Her headaches are usually localized to the right side. She reports that 1 day ago she had a quick shooting pain to the right side of her head that went away. Denies fever, chills, or any other associated symptoms.    Past Medical History  Diagnosis Date  . S/P colonoscopy 06/02/09    normal ([name])  . PUD (peptic ulcer disease) 01/2007    EGD Dr [name], 2 antral ulcers, negative h pylori  . MRSA infection     Dr. [name], currently under treatment  . Hiatal hernia 2008    on EGD above  . Asthma   . PTSD (post-traumatic stress disorder)   . Migraines   . Degenerative disc disease   . Rectal prolapse   . SBO (small bowel obstruction)   . ADHD (attention deficit hyperactivity disorder)   . S/P endoscopy 03/26/11    gastritis-Dr [name]  . Clostridium difficile colitis 04/10/11 &11/2011    tx w/ flagyl  . Chronic abdominal pain 2003    EX LAP APR 2004 RUPTURE L OV CYST, JUL 2004 ADHESIONS  . Irritable bowel syndrome 2004 CONSTIPATION  . BMI (body  mass index) 20.0-29.9 2009 127 LBS  . COPD (chronic obstructive pulmonary disease)   . Chronic nausea   . Anginal pain    Past Surgical History  Procedure Laterality Date  . Bowel resection      x2, secondary to adhesions  . Appendectomy    . Laparoscopic lysis intestinal adhesions    . Back surgery    . Abdominal hysterectomy    . Tubal ligation    . Umbilical hernia repair  2008    x5  . Neck surgery  2009  . Partial hysterectomy    . Upper gastrointestinal endoscopy  APR 2008 [facility] BLEEDING/PAIN    PUD  . Upper gastrointestinal endoscopy  JUL 2012 [facility] PAIN    MILD GASTRITIS  . Esophagogastroduodenoscopy  03/23/2011     Normal esophagus without evidence of Barrett's, mass, erosions, or ulcerations./ Patchy erythema with occasional erosion in the antrum.  Biopsies  obtained via cold forceps to evaluate for H. pylori gastritis/ Small hiatal hernia./Normal duodenal bulb and second portion of the duodenum.  . Cervical spine surgery    . Flexible sigmoidoscopy  07/14/2002    Normal limited flexible sigmoidoscopy with stool in the rectum and rectosigmoid precluding a full colonoscopy  . Colonoscopy  03/21/2012     YNW:GNFAOZ ADENOMA(1) POOR PREP  .  Esophagogastroduodenoscopy  03/21/2012    XJO:ITGPQ Hiatal hernia/ABDOMINALPAIN/DIARRHEA MOST LIKELY DUE TO IBS, GASTRITIS DUODENITIS  . Colonoscopy N/A 11/17/2013    DIY:MEBRAX mucosa in the terminal ileum/NORMAL surgical anastomosis/Small internal hemorrhoids   Family History  Problem Relation Age of Onset  . Adopted: Yes   History  Substance Use Topics  . Smoking status: Current Every Day Smoker -- 0.50 packs/day for 10 years    Types: Cigarettes  . Smokeless tobacco: Former Systems developer  . Alcohol Use: No   OB History    Gravida Para Term Preterm AB TAB SAB Ectopic Multiple Living   1 1 1       1      Review of Systems  All other systems reviewed and are negative.     Allergies  Penicillins; Sulfa antibiotics; Aspirin;  Ibuprofen; Lactose intolerance (gi); Morphine and related; and Buprenorphine hcl  Home Medications   Prior to Admission medications   Medication Sig Start Date End Date Taking? Authorizing Provider  albuterol (PROVENTIL,VENTOLIN) 90 MCG/ACT inhaler Inhale 2 puffs into the lungs every 6 (six) hours as needed. For shortness of breath    Historical Provider, MD  ALPRAZolam Duanne Moron) 1 MG tablet Take 1-2 mg by mouth 4 (four) times daily. Patient takes 1 tablet twice a day and 2 tablets at bedtime    Historical Provider, MD  amphetamine-dextroamphetamine (ADDERALL XR) 15 MG 24 hr capsule Take 15 mg by mouth 2 (two) times daily.     Historical Provider, MD  ciprofloxacin (CIPRO) 500 MG tablet Take 1 tablet (500 mg total) by mouth every 12 (twelve) hours. Patient not taking: Reported on 01/05/2015 12/07/14   [name], MD  dicyclomine (BENTYL) 10 MG capsule Take 1 capsule (10 mg total) by mouth 3 (three) times daily as needed for spasms. Patient not taking: Reported on 01/05/2015 07/06/14   [name], PA-C  dicyclomine (BENTYL) 20 MG tablet Take 20 mg by mouth 3 (three) times daily as needed for spasms.  01/03/15   Historical Provider, MD  docusate sodium 100 MG CAPS Take 100 mg by mouth 2 (two) times daily. Patient not taking: Reported on 12/07/2014 08/22/13   [name], MD  DULoxetine (CYMBALTA) 60 MG capsule Take 120 mg by mouth every morning.     Historical Provider, MD  esomeprazole (NEXIUM) 40 MG capsule Take 1 capsule (40 mg total) by mouth 2 (two) times daily before a meal. 05/17/14   [name], PA-C  gabapentin (NEURONTIN) 300 MG capsule Take 300-900 mg by mouth 2 (two) times daily. 1 capsule daily and 3 capsules at bedtime    Historical Provider, MD  mirtazapine (REMERON) 15 MG tablet Take 1 tablet by mouth at bedtime as needed (for rest).  08/17/13   Historical Provider, MD  traMADol (ULTRAM) 50 MG tablet Take 50 mg by mouth every 6 (six) hours as needed for moderate pain.  01/03/15    Historical Provider, MD   Triage Vitals: BP 128/83 mmHg  Pulse 82  Temp(Src) 98.4 F (36.9 C) (Oral)  Resp 20  Ht 5\' 1"  (1.549 m)  SpO2 95%   Physical Exam  Constitutional: She is oriented to person, place, and time. She appears well-developed and well-nourished. No distress.  Appears anxious.   HENT:  Head: Normocephalic and atraumatic.  No temporal tenderness.   Eyes: Conjunctivae and EOM are normal. Pupils are equal, round, and reactive to light. Right eye exhibits no discharge. Left eye exhibits no discharge.  Neck: Neck supple. No  tracheal deviation present.  No nuchal rigidity  Cardiovascular: Normal rate.   Pulmonary/Chest: Effort normal and breath sounds normal. No respiratory distress. She has no wheezes.  Musculoskeletal: Normal range of motion.  Decreased sensation to light touch plantar aspect of great big toe. Cap refill is brisk.   Neurological: She is alert and oriented to person, place, and time. No cranial nerve deficit. She exhibits normal muscle tone. Coordination normal.  Good finger to nose b/l.   Skin: Skin is warm and dry.  Psychiatric: Her behavior is normal.  Nursing note and vitals reviewed.   ED Course  Procedures (including critical care time)  DIAGNOSTIC STUDIES: Oxygen Saturation is 95% on RA, normal by my interpretation.    COORDINATION OF CARE: 12:58 PM-Discussed treatment plan with pt at bedside and pt agreed to plan.   Labs Review Labs Reviewed - No data to display  Imaging Review No results found.   EKG Interpretation None      MDM   Final diagnoses:  Nonintractable headache, unspecified chronicity pattern, unspecified headache type   53yF with headache. Suspect primary HA. Consider emergent secondary causes such as bleed, infectious or mass but doubt. There is no history of trauma. Pt has a nonfocal neurological exam. Afebrile and neck supple. No use of blood thinning medication. Consider ocular etiology such as acute angle  closure glaucoma but doubt. Pt denies acute change in visual acuity and eye exam unremarkable. Doubt temporal arteritis. Doubt CO poisoning. No contacts with similar symptoms. Doubt venous thrombosis. Doubt carotid or vertebral arteries dissection. Symptoms improved with meds. Feel that can be safely discharged, but strict return precautions discussed. Outpt fu.   I personally preformed the services scribed in my presence. The recorded information has been reviewed is accurate. Chelsea Manifold, MD.      Chelsea Manifold, MD 03/08/15 [phone]

## 2015-03-08 NOTE — ED Notes (Signed)
Pt ambulatory to room, no neuro deficits noted. Pt's ocular mvmts intact, both single and combined.

## 2015-03-08 NOTE — Discharge Instructions (Signed)

## 2015-03-08 NOTE — ED Notes (Signed)
Patient reports that she has a migraine since last night. Pain located in the right eye area, right ear and right side of head. Right eye jumping and blurry. Pain caused diarrhea throughout night

## 2015-03-13 ENCOUNTER — Inpatient Hospital Stay (HOSPITAL_COMMUNITY)
Admission: EM | Admit: 2015-03-13 | Discharge: 2015-03-17 | DRG: 192 | Disposition: A | Discharge status: Home / Self Care | Payer: Medicare Other | Attending: Internal Medicine | Admitting: Internal Medicine

## 2015-03-13 ENCOUNTER — Encounter (HOSPITAL_COMMUNITY): Payer: Self-pay | Admitting: *Deleted

## 2015-03-13 ENCOUNTER — Emergency Department (HOSPITAL_COMMUNITY): Payer: Medicare Other

## 2015-03-13 DIAGNOSIS — Z880 Allergy status to penicillin: Secondary | ICD-10-CM

## 2015-03-13 DIAGNOSIS — K580 Irritable bowel syndrome with diarrhea: Secondary | ICD-10-CM | POA: Diagnosis present

## 2015-03-13 DIAGNOSIS — Z90710 Acquired absence of both cervix and uterus: Secondary | ICD-10-CM

## 2015-03-13 DIAGNOSIS — Z720 Tobacco use: Secondary | ICD-10-CM | POA: Diagnosis present

## 2015-03-13 DIAGNOSIS — F909 Attention-deficit hyperactivity disorder, unspecified type: Secondary | ICD-10-CM | POA: Diagnosis present

## 2015-03-13 DIAGNOSIS — F411 Generalized anxiety disorder: Secondary | ICD-10-CM | POA: Diagnosis present

## 2015-03-13 DIAGNOSIS — Z9049 Acquired absence of other specified parts of digestive tract: Secondary | ICD-10-CM | POA: Diagnosis present

## 2015-03-13 DIAGNOSIS — G43909 Migraine, unspecified, not intractable, without status migrainosus: Secondary | ICD-10-CM | POA: Diagnosis present

## 2015-03-13 DIAGNOSIS — Z882 Allergy status to sulfonamides status: Secondary | ICD-10-CM

## 2015-03-13 DIAGNOSIS — E739 Lactose intolerance, unspecified: Secondary | ICD-10-CM | POA: Diagnosis present

## 2015-03-13 DIAGNOSIS — J441 Chronic obstructive pulmonary disease with (acute) exacerbation: Secondary | ICD-10-CM | POA: Diagnosis present

## 2015-03-13 DIAGNOSIS — F4310 Post-traumatic stress disorder, unspecified: Secondary | ICD-10-CM | POA: Diagnosis present

## 2015-03-13 DIAGNOSIS — K219 Gastro-esophageal reflux disease without esophagitis: Secondary | ICD-10-CM | POA: Diagnosis present

## 2015-03-13 DIAGNOSIS — Z8711 Personal history of peptic ulcer disease: Secondary | ICD-10-CM

## 2015-03-13 DIAGNOSIS — J189 Pneumonia, unspecified organism: Secondary | ICD-10-CM

## 2015-03-13 DIAGNOSIS — J45909 Unspecified asthma, uncomplicated: Secondary | ICD-10-CM | POA: Diagnosis present

## 2015-03-13 DIAGNOSIS — Z886 Allergy status to analgesic agent status: Secondary | ICD-10-CM | POA: Diagnosis not present

## 2015-03-13 DIAGNOSIS — Z8614 Personal history of Methicillin resistant Staphylococcus aureus infection: Secondary | ICD-10-CM

## 2015-03-13 DIAGNOSIS — I4891 Unspecified atrial fibrillation: Secondary | ICD-10-CM | POA: Diagnosis present

## 2015-03-13 DIAGNOSIS — D649 Anemia, unspecified: Secondary | ICD-10-CM | POA: Diagnosis present

## 2015-03-13 DIAGNOSIS — F17210 Nicotine dependence, cigarettes, uncomplicated: Secondary | ICD-10-CM | POA: Diagnosis present

## 2015-03-13 DIAGNOSIS — R0600 Dyspnea, unspecified: Secondary | ICD-10-CM | POA: Diagnosis not present

## 2015-03-13 DIAGNOSIS — J449 Chronic obstructive pulmonary disease, unspecified: Secondary | ICD-10-CM | POA: Diagnosis present

## 2015-03-13 DIAGNOSIS — G8929 Other chronic pain: Secondary | ICD-10-CM | POA: Diagnosis present

## 2015-03-13 LAB — BASIC METABOLIC PANEL
Anion gap: 9 (ref 5–15)
BUN: 8 mg/dL (ref 6–20)
CO2: 25 mmol/L (ref 22–32)
Calcium: 8.7 mg/dL — ABNORMAL LOW (ref 8.9–10.3)
Chloride: 109 mmol/L (ref 101–111)
Creatinine, Ser: 0.76 mg/dL (ref 0.44–1.00)
GFR calc Af Amer: >60 mL/min (ref >60)
GFR calc non Af Amer: >60 mL/min (ref >60)
Glucose, Bld: 85 mg/dL (ref 65–99)
Potassium: 3.7 mmol/L (ref 3.5–5.1)
Sodium: 143 mmol/L (ref 135–145)

## 2015-03-13 LAB — CBC
HCT: 36.7 % (ref 36.0–46.0)
HEMOGLOBIN: 11.6 g/dL — AB (ref 12.0–15.0)
MCH: 28.8 pg (ref 26.0–34.0)
MCHC: 31.6 g/dL (ref 30.0–36.0)
MCV: 91.1 fL (ref 78.0–100.0)
Platelets: 339 10*3/uL (ref 150–400)
RBC: 4.03 MIL/uL (ref 3.87–5.11)
RDW: 14 % (ref 11.5–15.5)
WBC: 8.4 10*3/uL (ref 4.0–10.5)

## 2015-03-13 LAB — I-STAT ARTERIAL BLOOD GAS, ED
BICARBONATE: 26.1 meq/L — AB (ref 20.0–24.0)
O2 SAT: 94 %
PH ART: 7.35 (ref 7.350–7.450)
PO2 ART: 76 mmHg — AB (ref 80.0–100.0)
TCO2: 28 mmol/L (ref 0–100)
pCO2 arterial: 47.3 mmHg — ABNORMAL HIGH (ref 35.0–45.0)

## 2015-03-13 LAB — I-STAT TROPONIN, ED: Troponin i, poc: 0 ng/mL (ref 0.00–0.08)

## 2015-03-13 MED ORDER — ALBUTEROL SULFATE (2.5 MG/3ML) 0.083% IN NEBU
5.0000 mg | INHALATION_SOLUTION | Freq: Once | RESPIRATORY_TRACT | Status: AC
  Administered 2015-03-13: 5 mg via RESPIRATORY_TRACT
  Filled 2015-03-13 (×2): qty 6

## 2015-03-13 MED ORDER — IPRATROPIUM BROMIDE 0.02 % IN SOLN
0.5000 mg | Freq: Once | RESPIRATORY_TRACT | Status: AC
  Administered 2015-03-13: 0.5 mg via RESPIRATORY_TRACT
  Filled 2015-03-13: qty 2.5

## 2015-03-13 MED ORDER — IPRATROPIUM-ALBUTEROL 0.5-2.5 (3) MG/3ML IN SOLN
RESPIRATORY_TRACT | Status: AC
  Administered 2015-03-13: 3 mL via RESPIRATORY_TRACT
  Filled 2015-03-13: qty 3

## 2015-03-13 MED ORDER — SODIUM CHLORIDE 0.9 % IV BOLUS (SEPSIS)
1000.0000 mL | Freq: Once | INTRAVENOUS | Status: AC
  Administered 2015-03-13: 1000 mL via INTRAVENOUS

## 2015-03-13 MED ORDER — ALBUTEROL (5 MG/ML) CONTINUOUS INHALATION SOLN
10.0000 mg/h | INHALATION_SOLUTION | RESPIRATORY_TRACT | Status: DC
  Administered 2015-03-13: 10 mg/h via RESPIRATORY_TRACT

## 2015-03-13 MED ORDER — ALBUTEROL (5 MG/ML) CONTINUOUS INHALATION SOLN
10.0000 mg/h | INHALATION_SOLUTION | RESPIRATORY_TRACT | Status: DC
  Administered 2015-03-13: 10 mg/h via RESPIRATORY_TRACT
  Filled 2015-03-13: qty 20

## 2015-03-13 MED ORDER — IPRATROPIUM-ALBUTEROL 0.5-2.5 (3) MG/3ML IN SOLN
3.0000 mL | Freq: Once | RESPIRATORY_TRACT | Status: AC
  Administered 2015-03-13: 3 mL via RESPIRATORY_TRACT

## 2015-03-13 MED ORDER — MAGNESIUM SULFATE 2 GM/50ML IV SOLN
2.0000 g | Freq: Once | INTRAVENOUS | Status: AC
  Administered 2015-03-13: 2 g via INTRAVENOUS
  Filled 2015-03-13: qty 50

## 2015-03-13 NOTE — ED Notes (Signed)
Ambulated pt in room.  Pt c/o dizziness and weakness.  Pt visibly shaking.

## 2015-03-13 NOTE — Progress Notes (Signed)
Adult wheeze protocol initiated per MD order.

## 2015-03-13 NOTE — ED Notes (Signed)
Patient transported to X-ray 

## 2015-03-13 NOTE — ED Notes (Signed)
Pt placed back on 2L O2 pulse ox at 88%

## 2015-03-13 NOTE — ED Notes (Signed)
Pt states that she has had wheezing and sob starting yesterday. Pt states that she has an appointment with her MD tomorrow. Pt noted to have expiratory wheezing in triage. Pt reports use of home nebs with no relief.

## 2015-03-13 NOTE — ED Notes (Signed)
PA at bedside.

## 2015-03-14 ENCOUNTER — Encounter (HOSPITAL_COMMUNITY): Payer: Self-pay | Admitting: *Deleted

## 2015-03-14 DIAGNOSIS — D649 Anemia, unspecified: Secondary | ICD-10-CM | POA: Diagnosis present

## 2015-03-14 LAB — COMPREHENSIVE METABOLIC PANEL
ALT: 36 U/L (ref 14–54)
ANION GAP: 10 (ref 5–15)
AST: 38 U/L (ref 15–41)
Albumin: 3.1 g/dL — ABNORMAL LOW (ref 3.5–5.0)
Alkaline Phosphatase: 78 U/L (ref 38–126)
BUN: 11 mg/dL (ref 6–20)
CALCIUM: 7.8 mg/dL — AB (ref 8.9–10.3)
CHLORIDE: 110 mmol/L (ref 101–111)
CO2: 21 mmol/L — ABNORMAL LOW (ref 22–32)
CREATININE: 0.58 mg/dL (ref 0.44–1.00)
GFR calc Af Amer: >60 mL/min (ref >60)
Glucose, Bld: 130 mg/dL — ABNORMAL HIGH (ref 65–99)
Potassium: 4.3 mmol/L (ref 3.5–5.1)
SODIUM: 141 mmol/L (ref 135–145)
Total Bilirubin: 0.9 mg/dL (ref 0.3–1.2)
Total Protein: 5.3 g/dL — ABNORMAL LOW (ref 6.5–8.1)

## 2015-03-14 LAB — CBC
HEMATOCRIT: 34.8 % — AB (ref 36.0–46.0)
Hemoglobin: 10.7 g/dL — ABNORMAL LOW (ref 12.0–15.0)
MCH: 28.8 pg (ref 26.0–34.0)
MCHC: 30.7 g/dL (ref 30.0–36.0)
MCV: 93.8 fL (ref 78.0–100.0)
Platelets: 204 10*3/uL (ref 150–400)
RBC: 3.71 MIL/uL — ABNORMAL LOW (ref 3.87–5.11)
RDW: 14.2 % (ref 11.5–15.5)
WBC: 7.5 10*3/uL (ref 4.0–10.5)

## 2015-03-14 LAB — MRSA PCR SCREENING: MRSA BY PCR: POSITIVE — AB

## 2015-03-14 LAB — GLUCOSE, CAPILLARY: Glucose-Capillary: 139 mg/dL — ABNORMAL HIGH (ref 65–99)

## 2015-03-14 MED ORDER — MIRTAZAPINE 15 MG PO TABS
15.0000 mg | ORAL_TABLET | Freq: Every evening | ORAL | Status: DC | PRN
  Administered 2015-03-15: 15 mg via ORAL
  Filled 2015-03-14 (×3): qty 1

## 2015-03-14 MED ORDER — LEVOFLOXACIN IN D5W 750 MG/150ML IV SOLN
750.0000 mg | Freq: Once | INTRAVENOUS | Status: AC
  Administered 2015-03-14: 750 mg via INTRAVENOUS
  Filled 2015-03-14: qty 150

## 2015-03-14 MED ORDER — IPRATROPIUM BROMIDE 0.02 % IN SOLN
0.5000 mg | Freq: Four times a day (QID) | RESPIRATORY_TRACT | Status: DC
Start: 2015-03-14 — End: 2015-03-16
  Administered 2015-03-14 – 2015-03-15 (×6): 0.5 mg via RESPIRATORY_TRACT
  Filled 2015-03-14 (×7): qty 2.5

## 2015-03-14 MED ORDER — ALPRAZOLAM 0.5 MG PO TABS
1.0000 mg | ORAL_TABLET | Freq: Four times a day (QID) | ORAL | Status: DC | PRN
  Administered 2015-03-14 – 2015-03-17 (×8): 1 mg via ORAL
  Filled 2015-03-14 (×8): qty 2

## 2015-03-14 MED ORDER — MUPIROCIN 2 % EX OINT
1.0000 "application " | TOPICAL_OINTMENT | Freq: Two times a day (BID) | CUTANEOUS | Status: DC
  Administered 2015-03-14 – 2015-03-17 (×7): 1 via NASAL
  Filled 2015-03-14 (×2): qty 22

## 2015-03-14 MED ORDER — GABAPENTIN 300 MG PO CAPS: 300.0000 mg | ORAL_CAPSULE | Freq: Two times a day (BID) | ORAL | Status: DC

## 2015-03-14 MED ORDER — HYDROCODONE-HOMATROPINE 5-1.5 MG/5ML PO SYRP
5.0000 mL | ORAL_SOLUTION | Freq: Four times a day (QID) | ORAL | Status: DC | PRN
  Administered 2015-03-14: 5 mL via ORAL
  Filled 2015-03-14: qty 5

## 2015-03-14 MED ORDER — LEVALBUTEROL HCL 1.25 MG/0.5ML IN NEBU
1.2500 mg | INHALATION_SOLUTION | Freq: Four times a day (QID) | RESPIRATORY_TRACT | Status: DC
  Administered 2015-03-14 (×2): 1.25 mg via RESPIRATORY_TRACT
  Filled 2015-03-14 (×5): qty 0.5

## 2015-03-14 MED ORDER — ALBUTEROL SULFATE (2.5 MG/3ML) 0.083% IN NEBU
2.5000 mg | INHALATION_SOLUTION | RESPIRATORY_TRACT | Status: DC
  Administered 2015-03-14: 2.5 mg via RESPIRATORY_TRACT
  Filled 2015-03-14 (×2): qty 3

## 2015-03-14 MED ORDER — ALPRAZOLAM 0.5 MG PO TABS: 2.0000 mg | ORAL_TABLET | Freq: Every day | ORAL | Status: DC

## 2015-03-14 MED ORDER — GABAPENTIN 300 MG PO CAPS
900.0000 mg | ORAL_CAPSULE | Freq: Every day | ORAL | Status: DC
  Administered 2015-03-14 – 2015-03-16 (×3): 900 mg via ORAL
  Filled 2015-03-14 (×4): qty 3

## 2015-03-14 MED ORDER — ACETAMINOPHEN 650 MG RE SUPP: 650.0000 mg | Freq: Four times a day (QID) | RECTAL | Status: DC | PRN

## 2015-03-14 MED ORDER — LEVOFLOXACIN IN D5W 750 MG/150ML IV SOLN
750.0000 mg | INTRAVENOUS | Status: DC
Start: 2015-03-14 — End: 2015-03-15
  Administered 2015-03-14: 750 mg via INTRAVENOUS
  Filled 2015-03-14 (×3): qty 150

## 2015-03-14 MED ORDER — DICYCLOMINE HCL 20 MG PO TABS
20.0000 mg | ORAL_TABLET | Freq: Three times a day (TID) | ORAL | Status: DC | PRN
  Filled 2015-03-14: qty 1

## 2015-03-14 MED ORDER — METHYLPREDNISOLONE SODIUM SUCC 125 MG IJ SOLR
125.0000 mg | Freq: Once | INTRAMUSCULAR | Status: AC
  Administered 2015-03-14: 125 mg via INTRAVENOUS
  Filled 2015-03-14: qty 2

## 2015-03-14 MED ORDER — METHYLPREDNISOLONE SODIUM SUCC 125 MG IJ SOLR
80.0000 mg | Freq: Three times a day (TID) | INTRAMUSCULAR | Status: DC
  Administered 2015-03-14 – 2015-03-16 (×7): 80 mg via INTRAVENOUS
  Filled 2015-03-14 (×9): qty 1.28
  Filled 2015-03-14: qty 2

## 2015-03-14 MED ORDER — ONDANSETRON HCL 4 MG/2ML IJ SOLN: 4.0000 mg | Freq: Four times a day (QID) | INTRAMUSCULAR | Status: DC | PRN

## 2015-03-14 MED ORDER — ACETAMINOPHEN 325 MG PO TABS: 650.0000 mg | ORAL_TABLET | Freq: Four times a day (QID) | ORAL | Status: DC | PRN

## 2015-03-14 MED ORDER — GUAIFENESIN 100 MG/5ML PO SOLN
5.0000 mL | ORAL | Status: DC | PRN
  Administered 2015-03-14 – 2015-03-17 (×8): 100 mg via ORAL
  Filled 2015-03-14 (×10): qty 5

## 2015-03-14 MED ORDER — PANTOPRAZOLE SODIUM 40 MG PO TBEC
40.0000 mg | DELAYED_RELEASE_TABLET | Freq: Every day | ORAL | Status: DC
  Administered 2015-03-14 – 2015-03-17 (×4): 40 mg via ORAL
  Filled 2015-03-14 (×3): qty 1

## 2015-03-14 MED ORDER — TIOTROPIUM BROMIDE MONOHYDRATE 18 MCG IN CAPS
18.0000 ug | ORAL_CAPSULE | Freq: Every day | RESPIRATORY_TRACT | Status: DC
  Filled 2015-03-14: qty 5

## 2015-03-14 MED ORDER — ENOXAPARIN SODIUM 40 MG/0.4ML ~~LOC~~ SOLN
40.0000 mg | SUBCUTANEOUS | Status: DC
  Administered 2015-03-14 – 2015-03-17 (×4): 40 mg via SUBCUTANEOUS
  Filled 2015-03-14 (×4): qty 0.4

## 2015-03-14 MED ORDER — HYDROCODONE-ACETAMINOPHEN 5-325 MG PO TABS
1.0000 | ORAL_TABLET | Freq: Four times a day (QID) | ORAL | Status: DC | PRN
  Administered 2015-03-14 – 2015-03-17 (×9): 2 via ORAL
  Filled 2015-03-14 (×11): qty 2

## 2015-03-14 MED ORDER — SODIUM CHLORIDE 0.9 % IJ SOLN
3.0000 mL | Freq: Two times a day (BID) | INTRAMUSCULAR | Status: DC
  Administered 2015-03-14 – 2015-03-17 (×8): 3 mL via INTRAVENOUS

## 2015-03-14 MED ORDER — DULOXETINE HCL 60 MG PO CPEP
120.0000 mg | ORAL_CAPSULE | Freq: Every day | ORAL | Status: DC
  Administered 2015-03-14 – 2015-03-17 (×4): 120 mg via ORAL
  Filled 2015-03-14 (×4): qty 2

## 2015-03-14 MED ORDER — GABAPENTIN 300 MG PO CAPS
300.0000 mg | ORAL_CAPSULE | Freq: Every day | ORAL | Status: DC
  Administered 2015-03-14 – 2015-03-17 (×4): 300 mg via ORAL
  Filled 2015-03-14 (×4): qty 1

## 2015-03-14 MED ORDER — ALPRAZOLAM 0.5 MG PO TABS: 1.0000 mg | ORAL_TABLET | Freq: Four times a day (QID) | ORAL | Status: DC

## 2015-03-14 MED ORDER — AMPHETAMINE-DEXTROAMPHET ER 5 MG PO CP24
15.0000 mg | ORAL_CAPSULE | Freq: Two times a day (BID) | ORAL | Status: DC
  Administered 2015-03-14 – 2015-03-17 (×7): 15 mg via ORAL
  Filled 2015-03-14 (×9): qty 3

## 2015-03-14 MED ORDER — ALPRAZOLAM 0.5 MG PO TABS: 1.0000 mg | ORAL_TABLET | Freq: Two times a day (BID) | ORAL | Status: DC

## 2015-03-14 MED ORDER — CHLORHEXIDINE GLUCONATE CLOTH 2 % EX PADS
6.0000 | MEDICATED_PAD | Freq: Every day | CUTANEOUS | Status: DC
  Administered 2015-03-14 – 2015-03-17 (×4): 6 via TOPICAL

## 2015-03-14 MED ORDER — CETYLPYRIDINIUM CHLORIDE 0.05 % MT LIQD
7.0000 mL | Freq: Two times a day (BID) | OROMUCOSAL | Status: DC
  Administered 2015-03-14 – 2015-03-17 (×6): 7 mL via OROMUCOSAL

## 2015-03-14 MED ORDER — ALBUTEROL SULFATE (2.5 MG/3ML) 0.083% IN NEBU: 2.5000 mg | INHALATION_SOLUTION | RESPIRATORY_TRACT | Status: DC | PRN

## 2015-03-14 MED ORDER — TRAMADOL HCL 50 MG PO TABS: 50.0000 mg | ORAL_TABLET | Freq: Four times a day (QID) | ORAL | Status: DC | PRN

## 2015-03-14 NOTE — H&P (Signed)
[name] is an 54 y.o. female.    DR. Jonelle Sidle (pcp)  Chief Complaint: dyspnea HPI: 54 yo female with Copd not on home o2, apparently c/o dyspnea x 2 days.  Worse today.  + cough with occasional yellow sputum.  Slight sscp "sharp" with cough. Today.   Denies fever, chills,  palp, n/v, diarrhea.  Pt presented to ED, had CXR negative.  Pt will be admitted for Copd exacerbation.   Past Medical History  Diagnosis Date  . S/P colonoscopy 06/02/09    normal ([name])  . PUD (peptic ulcer disease) 01/2007    EGD Dr [name], 2 antral ulcers, negative h pylori  . MRSA infection     Dr. [name], currently under treatment  . Hiatal hernia 2008    on EGD above  . Asthma   . PTSD (post-traumatic stress disorder)   . Migraines   . Degenerative disc disease   . Rectal prolapse   . SBO (small bowel obstruction)   . ADHD (attention deficit hyperactivity disorder)   . S/P endoscopy 03/26/11    gastritis-Dr [name]  . Clostridium difficile colitis 04/10/11 &11/2011    tx w/ flagyl  . Chronic abdominal pain 2003    EX LAP APR 2004 RUPTURE L OV CYST, JUL 2004 ADHESIONS  . Irritable bowel syndrome 2004 CONSTIPATION  . BMI (body mass index) 20.0-29.9 2009 127 LBS  . COPD (chronic obstructive pulmonary disease)   . Chronic nausea   . Anginal pain     Past Surgical History  Procedure Laterality Date  . Bowel resection      x2, secondary to adhesions  . Appendectomy    . Laparoscopic lysis intestinal adhesions    . Back surgery    . Abdominal hysterectomy    . Tubal ligation    . Umbilical hernia repair  2008    x5  . Neck surgery  2009  . Partial hysterectomy    . Upper gastrointestinal endoscopy  APR 2008 [facility] BLEEDING/PAIN    PUD  . Upper gastrointestinal endoscopy  JUL 2012 [facility] PAIN    MILD GASTRITIS  . Esophagogastroduodenoscopy  03/23/2011     Normal esophagus without evidence of Barrett's, mass, erosions, or ulcerations./ Patchy erythema with occasional erosion in the  antrum.  Biopsies  obtained via cold forceps to evaluate for H. pylori gastritis/ Small hiatal hernia./Normal duodenal bulb and second portion of the duodenum.  . Cervical spine surgery    . Flexible sigmoidoscopy  07/14/2002    Normal limited flexible sigmoidoscopy with stool in the rectum and rectosigmoid precluding a full colonoscopy  . Colonoscopy  03/21/2012     ACZ:YSAYTK ADENOMA(1) POOR PREP  . Esophagogastroduodenoscopy  03/21/2012    ZSW:FUXNA Hiatal hernia/ABDOMINALPAIN/DIARRHEA MOST LIKELY DUE TO IBS, GASTRITIS DUODENITIS  . Colonoscopy N/A 11/17/2013    TFT:DDUKGU mucosa in the terminal ileum/NORMAL surgical anastomosis/Small internal hemorrhoids    Family History  Problem Relation Age of Onset  . Adopted: Yes   Social History:  reports that she has been smoking Cigarettes.  She has a 5 pack-year smoking history. She has quit using smokeless tobacco. She reports that she does not drink alcohol or use illicit drugs.  Allergies:  Allergies  Allergen Reactions  . Penicillins Anaphylaxis    REACTION: anaphylaxis  . Sulfa Antibiotics Itching  . Aspirin Other (See Comments)    Burning of stomach. REACTION: GI upset  . Ibuprofen Nausea Only    Upset stomach due to acid reflux  .  Lactose Intolerance (Gi) Diarrhea  . Morphine And Related Itching  . Buprenorphine Hcl Itching   Medications reviewed  Results for orders placed or performed during the hospital encounter of 03/13/15 (from the past 48 hour(s))  Basic metabolic panel  (if pt has PMH of COPD)     Status: Abnormal   Collection Time: 03/13/15  5:41 PM  Result Value Ref Range   Sodium 143 135 - 145 mmol/L   Potassium 3.7 3.5 - 5.1 mmol/L   Chloride 109 101 - 111 mmol/L   CO2 25 22 - 32 mmol/L   Glucose, Bld 85 65 - 99 mg/dL   BUN 8 6 - 20 mg/dL   Creatinine, Ser 0.76 0.44 - 1.00 mg/dL   Calcium 8.7 (L) 8.9 - 10.3 mg/dL   GFR calc non Af Amer >60 >60 mL/min   GFR calc Af Amer >60 >60 mL/min    Comment:  (NOTE) The eGFR has been calculated using the CKD EPI equation. This calculation has not been validated in all clinical situations. eGFR's persistently <60 mL/min signify possible Chronic Kidney Disease.    Anion gap 9 5 - 15  CBC  (if pt has PMH of COPD)     Status: Abnormal   Collection Time: 03/13/15  5:41 PM  Result Value Ref Range   WBC 8.4 4.0 - 10.5 K/uL   RBC 4.03 3.87 - 5.11 MIL/uL   Hemoglobin 11.6 (L) 12.0 - 15.0 g/dL   HCT 36.7 36.0 - 46.0 %   MCV 91.1 78.0 - 100.0 fL   MCH 28.8 26.0 - 34.0 pg   MCHC 31.6 30.0 - 36.0 g/dL   RDW 14.0 11.5 - 15.5 %   Platelets 339 150 - 400 K/uL  I-stat troponin, ED  (if patient has PMH of COPD)  not at [facility]     Status: None   Collection Time: 03/13/15  5:58 PM  Result Value Ref Range   Troponin i, poc 0.00 0.00 - 0.08 ng/mL   Comment 3            Comment: Due to the release kinetics of cTnI, a negative result within the first hours of the onset of symptoms does not rule out myocardial infarction with certainty. If myocardial infarction is still suspected, repeat the test at appropriate intervals.   I-Stat Arterial Blood Gas, ED - (order at [facility] and [facility] only)     Status: Abnormal   Collection Time: 03/13/15  9:32 PM  Result Value Ref Range   pH, Arterial 7.350 7.350 - 7.450   pCO2 arterial 47.3 (H) 35.0 - 45.0 mmHg   pO2, Arterial 76.0 (L) 80.0 - 100.0 mmHg   Bicarbonate 26.1 (H) 20.0 - 24.0 mEq/L   TCO2 28 0 - 100 mmol/L   O2 Saturation 94.0 %   Sample type ARTERIAL    Dg Chest 2 View (if Patient Has Fever And/or Copd)  03/13/2015   CLINICAL DATA:  Shortness of breath with productive cough and wheezing for 3 days. Initial encounter.  EXAM: CHEST  2 VIEW  COMPARISON:  Radiographs 04/18/2014.  FINDINGS: The heart size and mediastinal contours are stable. There are moderate upper lobe emphysematous changes with chronic scarring or atelectasis at the left lung base. No edema, confluent airspace opacity or significant pleural  effusion demonstrated. There is no pneumothorax. Telemetry leads overlie the chest. There are postsurgical changes status post lower cervical fusion.  IMPRESSION: Stable findings of chronic obstructive pulmonary disease. No acute findings identified.  Electronically Signed   By: Richardean Sale M.D.   On: 03/13/2015 18:38    Review of Systems  Constitutional: Negative.   HENT: Negative.   Eyes: Negative.   Respiratory: Positive for cough, shortness of breath and wheezing. Negative for hemoptysis and sputum production.   Cardiovascular: Positive for chest pain. Negative for palpitations, orthopnea, claudication, leg swelling and PND.  Gastrointestinal: Negative.   Genitourinary: Negative.   Musculoskeletal: Negative.   Skin: Negative.   Neurological: Negative.   Endo/Heme/Allergies: Negative.   Psychiatric/Behavioral: Negative.     Blood pressure 109/60, pulse 78, temperature 98.9 F (37.2 C), temperature source Oral, resp. rate 19, weight 70.126 kg (154 lb 9.6 oz), SpO2 97 %. Physical Exam  Constitutional: She is oriented to person, place, and time. She appears well-developed and well-nourished.  HENT:  Head: Normocephalic and atraumatic.  Eyes: Conjunctivae and EOM are normal. Pupils are equal, round, and reactive to light. No scleral icterus.  Neck: Normal range of motion. Neck supple. No JVD present. No tracheal deviation present. No thyromegaly present.  Cardiovascular: Normal rate and regular rhythm.  Exam reveals no gallop and no friction rub.   No murmur heard. Respiratory: Effort normal and breath sounds normal. No respiratory distress. She has no wheezes. She has no rales.  GI: Soft. Bowel sounds are normal. She exhibits no distension. There is no tenderness. There is no rebound and no guarding.  Musculoskeletal: Normal range of motion. She exhibits no edema or tenderness.  Lymphadenopathy:    She has no cervical adenopathy.  Neurological: She is alert and oriented to  person, place, and time. She has normal reflexes. She displays normal reflexes. No cranial nerve deficit. She exhibits normal muscle tone. Coordination normal.  Skin: Skin is warm and dry. No rash noted. No erythema. No pallor.  Psychiatric: She has a normal mood and affect. Her behavior is normal. Judgment and thought content normal.     Assessment/Plan Copd exacerbation Solumedrol iv levaquin iv spiriva 1puff qday Albuterol neb q4h and q4h prn  Cough Hycodan cough syrup  Anemia Check cbc in am  Gerd Cont current tx  DVT prophylaxis:scd, lovenox  Jani Gravel 03/14/2015, 12:33 AM

## 2015-03-14 NOTE — ED Provider Notes (Signed)
CSN: [id number]     Arrival date & time 03/13/15  1710 History   First MD Initiated Contact with Patient 03/13/15 1746     Chief Complaint  Patient presents with  . Shortness of Breath  . Wheezing     (Consider location/radiation/quality/duration/timing/severity/associated sxs/prior Treatment) Patient is a 54 y.o. female presenting with shortness of breath and wheezing.  Shortness of Breath Severity:  Moderate Onset quality:  Gradual Duration:  1 day Timing:  Constant Progression:  Worsening Chronicity:  Recurrent Context comment:  Continues to smoke Relieved by:  Nothing Worsened by:  Nothing tried Ineffective treatments:  None tried Associated symptoms: sputum production (green) and wheezing   Associated symptoms: no chest pain, no hemoptysis and no syncope   Wheezing Associated symptoms: shortness of breath and sputum production (green)   Associated symptoms: no chest pain     Past Medical History  Diagnosis Date  . S/P colonoscopy 06/02/09    normal ([name])  . PUD (peptic ulcer disease) 01/2007    EGD Dr [name], 2 antral ulcers, negative h pylori  . MRSA infection     Dr. [name], currently under treatment  . Hiatal hernia 2008    on EGD above  . Asthma   . PTSD (post-traumatic stress disorder)   . Migraines   . Degenerative disc disease   . Rectal prolapse   . SBO (small bowel obstruction)   . ADHD (attention deficit hyperactivity disorder)   . S/P endoscopy 03/26/11    gastritis-Dr [name]  . Clostridium difficile colitis 04/10/11 &11/2011    tx w/ flagyl  . Chronic abdominal pain 2003    EX LAP APR 2004 RUPTURE L OV CYST, JUL 2004 ADHESIONS  . Irritable bowel syndrome 2004 CONSTIPATION  . BMI (body mass index) 20.0-29.9 2009 127 LBS  . COPD (chronic obstructive pulmonary disease)   . Chronic nausea   . Anginal pain    Past Surgical History  Procedure Laterality Date  . Bowel resection      x2, secondary to adhesions  . Appendectomy    .  Laparoscopic lysis intestinal adhesions    . Back surgery    . Abdominal hysterectomy    . Tubal ligation    . Umbilical hernia repair  2008    x5  . Neck surgery  2009  . Partial hysterectomy    . Upper gastrointestinal endoscopy  APR 2008 [facility] BLEEDING/PAIN    PUD  . Upper gastrointestinal endoscopy  JUL 2012 [facility] PAIN    MILD GASTRITIS  . Esophagogastroduodenoscopy  03/23/2011     Normal esophagus without evidence of Barrett's, mass, erosions, or ulcerations./ Patchy erythema with occasional erosion in the antrum.  Biopsies  obtained via cold forceps to evaluate for H. pylori gastritis/ Small hiatal hernia./Normal duodenal bulb and second portion of the duodenum.  . Cervical spine surgery    . Flexible sigmoidoscopy  07/14/2002    Normal limited flexible sigmoidoscopy with stool in the rectum and rectosigmoid precluding a full colonoscopy  . Colonoscopy  03/21/2012     CHY:IFOYDX ADENOMA(1) POOR PREP  . Esophagogastroduodenoscopy  03/21/2012    AJO:INOMV Hiatal hernia/ABDOMINALPAIN/DIARRHEA MOST LIKELY DUE TO IBS, GASTRITIS DUODENITIS  . Colonoscopy N/A 11/17/2013    EHM:CNOBSJ mucosa in the terminal ileum/NORMAL surgical anastomosis/Small internal hemorrhoids   Family History  Problem Relation Age of Onset  . Adopted: Yes   History  Substance Use Topics  . Smoking status: Current Every Day Smoker -- 0.50 packs/day for 10 years  Types: Cigarettes  . Smokeless tobacco: Former Systems developer  . Alcohol Use: No   OB History    Gravida Para Term Preterm AB TAB SAB Ectopic Multiple Living   1 1 1       1      Review of Systems  Respiratory: Positive for sputum production (green), shortness of breath and wheezing. Negative for hemoptysis.   Cardiovascular: Negative for chest pain and syncope.  All other systems reviewed and are negative.     Allergies  Penicillins; Sulfa antibiotics; Aspirin; Ibuprofen; Lactose intolerance (gi); Morphine and related; and Buprenorphine hcl  Home  Medications   Prior to Admission medications   Medication Sig Start Date End Date Taking? Authorizing Provider  albuterol (PROVENTIL,VENTOLIN) 90 MCG/ACT inhaler Inhale 2 puffs into the lungs every 6 (six) hours as needed. For shortness of breath   Yes Historical Provider, MD  ALPRAZolam Duanne Moron) 1 MG tablet Take 1-2 mg by mouth 4 (four) times daily. Patient takes 1 tablet twice a day and 2 tablets at bedtime   Yes Historical Provider, MD  amphetamine-dextroamphetamine (ADDERALL XR) 15 MG 24 hr capsule Take 15 mg by mouth 2 (two) times daily.    Yes Historical Provider, MD  dicyclomine (BENTYL) 20 MG tablet Take 20 mg by mouth 3 (three) times daily as needed for spasms.  01/03/15  Yes Historical Provider, MD  DULoxetine (CYMBALTA) 60 MG capsule Take 120 mg by mouth every morning.    Yes Historical Provider, MD  esomeprazole (NEXIUM) 40 MG capsule Take 1 capsule (40 mg total) by mouth 2 (two) times daily before a meal. 05/17/14  Yes [name], PA-C  gabapentin (NEURONTIN) 300 MG capsule Take 300-900 mg by mouth 2 (two) times daily. 1 capsule in the AM and 3 capsules at bedtime   Yes Historical Provider, MD  mirtazapine (REMERON) 15 MG tablet Take 1 tablet by mouth at bedtime as needed (for rest).  08/17/13  Yes Historical Provider, MD  traMADol (ULTRAM) 50 MG tablet Take 50 mg by mouth every 6 (six) hours as needed for moderate pain.  01/03/15  Yes Historical Provider, MD  ciprofloxacin (CIPRO) 500 MG tablet Take 1 tablet (500 mg total) by mouth every 12 (twelve) hours. Patient not taking: Reported on 01/05/2015 12/07/14   [name], MD  dicyclomine (BENTYL) 10 MG capsule Take 1 capsule (10 mg total) by mouth 3 (three) times daily as needed for spasms. Patient not taking: Reported on 01/05/2015 07/06/14   [name], PA-C  docusate sodium 100 MG CAPS Take 100 mg by mouth 2 (two) times daily. Patient not taking: Reported on 12/07/2014 08/22/13   [name], MD   BP 109/60 mmHg  Pulse 78   Temp(Src) 98.9 F (37.2 C) (Oral)  Resp 19  Wt 154 lb 9.6 oz (70.126 kg)  SpO2 97% Physical Exam  Constitutional: She is oriented to person, place, and time. She appears well-developed and well-nourished.  HENT:  Head: Normocephalic and atraumatic.  Right Ear: External ear normal.  Left Ear: External ear normal.  Eyes: Conjunctivae and EOM are normal. Pupils are equal, round, and reactive to light.  Neck: Normal range of motion. Neck supple.  Cardiovascular: Normal rate, regular rhythm, normal heart sounds and intact distal pulses.   Pulmonary/Chest: Effort normal. She has wheezes (diffuse).  Abdominal: Soft. Bowel sounds are normal. There is no tenderness.  Musculoskeletal: Normal range of motion.  Neurological: She is alert and oriented to person, place, and time.  Skin: Skin  is warm and dry.  Vitals reviewed.   ED Course  Procedures (including critical care time) Labs Review Labs Reviewed  BASIC METABOLIC PANEL - Abnormal; Notable for the following:    Calcium 8.7 (*)    All other components within normal limits  CBC - Abnormal; Notable for the following:    Hemoglobin 11.6 (*)    All other components within normal limits  I-STAT ARTERIAL BLOOD GAS, ED - Abnormal; Notable for the following:    pCO2 arterial 47.3 (*)    pO2, Arterial 76.0 (*)    Bicarbonate 26.1 (*)    All other components within normal limits  I-STAT TROPOININ, ED    Imaging Review Dg Chest 2 View (if Patient Has Fever And/or Copd)  03/13/2015   CLINICAL DATA:  Shortness of breath with productive cough and wheezing for 3 days. Initial encounter.  EXAM: CHEST  2 VIEW  COMPARISON:  Radiographs 04/18/2014.  FINDINGS: The heart size and mediastinal contours are stable. There are moderate upper lobe emphysematous changes with chronic scarring or atelectasis at the left lung base. No edema, confluent airspace opacity or significant pleural effusion demonstrated. There is no pneumothorax. Telemetry leads overlie  the chest. There are postsurgical changes status post lower cervical fusion.  IMPRESSION: Stable findings of chronic obstructive pulmonary disease. No acute findings identified.   Electronically Signed   By: Richardean Sale M.D.   On: 03/13/2015 18:38     EKG Interpretation   Date/Time:  Sunday March 13 2015 17:29:19 EDT Ventricular Rate:  89 PR Interval:  130 QRS Duration: 74 QT Interval:  376 QTC Calculation: 457 R Axis:   65 Text Interpretation:  Normal sinus rhythm Normal ECG No significant change  since last tracing Confirmed by Debby Freiberg [phone]) on 03/13/2015  6:05:03 PM     CRITICAL CARE Performed by: Debby Freiberg   Total critical care time: 35 min  Critical care time was exclusive of separately billable procedures and treating other patients.  Critical care was necessary to treat or prevent imminent or life-threatening deterioration.  Critical care was time spent personally by me on the following activities: development of treatment plan with patient and/or surrogate as well as nursing, discussions with consultants, evaluation of patient's response to treatment, examination of patient, obtaining history from patient or surrogate, ordering and performing treatments and interventions, ordering and review of laboratory studies, ordering and review of radiographic studies, pulse oximetry and re-evaluation of patient's condition.  MDM   Final diagnoses:  None    54 y.o. female with pertinent PMH of COPD presents with recurrent dyspnea, acute on chronic.  Not on home O2. Here persistently dyspneic despite 3 hours continuous albuterol, still hypoxic.  Tachypnea and accessory use did improve.  BP improved after 1L NS bolus.  Empiric CAP abx given, with solumedrol.  Admitted in stable condition.  I have reviewed all laboratory and imaging studies if ordered as above  1. COPD exacerbation    Debby Freiberg, MD 03/14/15 [phone]

## 2015-03-14 NOTE — Progress Notes (Signed)
Patient seen and examined this morning, admitted overnight with COPD exacerbation - breathing has improved however still has significant cough - unfortunately continues to smoke    COPD exacerbation - continue IV solu-medrol, will change to Xopenex due to tremors after albuterol, continue Atrovent - continue Levaquin - guaifenesin   Anxiety / PTSD - continue home Xanax / Adderal - seeing psychiatry as an outpatient   Tobacco abuse - counseled for cessation   Stable for floor.   Nabeeha Badertscher M. Cruzita Lederer, MD [facility] [phone]

## 2015-03-14 NOTE — Progress Notes (Signed)
Pt requesting guaifenesin/ robitussin, given as ordered as needed for coughing, will monitor patient. Jamar Weatherall, Bettina Gavia RN

## 2015-03-15 DIAGNOSIS — D649 Anemia, unspecified: Secondary | ICD-10-CM

## 2015-03-15 DIAGNOSIS — F411 Generalized anxiety disorder: Secondary | ICD-10-CM | POA: Diagnosis present

## 2015-03-15 MED ORDER — LEVALBUTEROL HCL 1.25 MG/0.5ML IN NEBU
1.2500 mg | INHALATION_SOLUTION | Freq: Four times a day (QID) | RESPIRATORY_TRACT | Status: DC
  Administered 2015-03-15 (×3): 1.25 mg via RESPIRATORY_TRACT
  Filled 2015-03-15 (×6): qty 0.5

## 2015-03-15 MED ORDER — ALUM & MAG HYDROXIDE-SIMETH 200-200-20 MG/5ML PO SUSP
30.0000 mL | Freq: Four times a day (QID) | ORAL | Status: DC | PRN
  Administered 2015-03-15 – 2015-03-17 (×3): 30 mL via ORAL
  Filled 2015-03-15 (×3): qty 30

## 2015-03-15 MED ORDER — DICLOFENAC SODIUM 1 % TD GEL
2.0000 g | Freq: Four times a day (QID) | TRANSDERMAL | Status: DC
  Administered 2015-03-15 – 2015-03-17 (×9): 2 g via TOPICAL
  Filled 2015-03-15: qty 100

## 2015-03-15 MED ORDER — LEVOFLOXACIN 750 MG PO TABS
750.0000 mg | ORAL_TABLET | Freq: Every day | ORAL | Status: DC
  Administered 2015-03-15 – 2015-03-16 (×2): 750 mg via ORAL
  Filled 2015-03-15 (×3): qty 1

## 2015-03-15 NOTE — Care Management Note (Signed)
Case Management Note  Patient Details  Name: [name] MRN: [id number] Date of Birth: 17-Apr-1961  Subjective/Objective:   Pt was admitted with COPD excerbation                 Action/Plan:  Pt is independent from home aloneCM will continue to  Monitor for disposition needs   Expected Discharge Date:                  Expected Discharge Plan:  Home/Self Care  In-House Referral:     Discharge planning Services  CM Consult  Post Acute Care Choice:    Choice offered to:     DME Arranged:    DME Agency:     HH Arranged:    HH Agency:     Status of Service:  In process, will continue to follow  Medicare Important Message Given:  Yes Date Medicare IM Given:  03/15/15 Medicare IM give by:  Elenor Quinones Date Additional Medicare IM Given:    Additional Medicare Important Message give by:     If discussed at [place] of Stay Meetings, dates discussed:    Additional Comments:  Maryclare Labrador, RN 03/15/2015, 11:13 AM

## 2015-03-15 NOTE — Progress Notes (Signed)
PROGRESS NOTE  [name] WHQ:[id number] DOB: [birth year] DOA: 03/13/2015 PCP: Barbette Merino, MD   HPI: [name] is a 54 y.o. female with a history of COPD, asthma, tobacco abuse, PUD, PTSD, migraines, ADHD, MRSA, hiatal hernia, and IBS who presented to the ED 03/13/15 with progressively worsening shortness of breath, wheezing, and occasionally productive cough for a duration of 2 days. She was admitted 03/13/15 for an Acute COPD Exacerbation.   Subjective / 24 H Interval events - feeling better today - breathing improved - cough decreased except with deep breathing, she "can't bring anything up" - anxious - new onset R back pain with cough - denies fever/chills, fatigue - endorses significant difficulties with PO antibiotics and PO steroids  Assessment/Plan: Active Problems:   Tobacco abuse   COPD exacerbation   Anemia   Anxiety state   COPD exacerbation - continue IV solu-medrol, continue Atrovent, continue Levaquin IV - guaifenesin   Mild normocytic anemia - stable - obtain anemia panel  Anxiety / PTSD - continue Xanax / Adderal - seeing psychiatry as an outpatient   Tobacco abuse - counseled for cessation   Diet: Diet regular Room service appropriate?: Yes; Fluid consistency:: Thin Fluids: None DVT Prophylaxis: Enoxaparin  Code Status: Full Code Family Communication: None Disposition Plan: Home when ready, 1-2 days  Consultants:  None   Procedures:  None   Antibiotics Levofloxacin 6/6 >>   Studies  Dg Chest 2 View (if Patient Has Fever And/or Copd)  03/13/2015   CLINICAL DATA:  Shortness of breath with productive cough and wheezing for 3 days. Initial encounter.  EXAM: CHEST  2 VIEW  COMPARISON:  Radiographs 04/18/2014.  FINDINGS: The heart size and mediastinal contours are stable. There are moderate upper lobe emphysematous changes with chronic scarring or atelectasis at the left lung base. No edema, confluent airspace opacity or significant  pleural effusion demonstrated. There is no pneumothorax. Telemetry leads overlie the chest. There are postsurgical changes status post lower cervical fusion.  IMPRESSION: Stable findings of chronic obstructive pulmonary disease. No acute findings identified.   Electronically Signed   By: Richardean Sale M.D.   On: 03/13/2015 18:38    Objective  Filed Vitals:   03/14/15 1200 03/14/15 1246 03/14/15 2057 03/15/15 0433  BP: 105/54 101/58 121/68 118/64  Pulse: 74 85 89 93  Temp:  97.5 F (36.4 C) 98.4 F (36.9 C) 97.8 F (36.6 C)  TempSrc:   Oral Oral  Resp: 12  17 18   Height:      Weight:    71.215 kg (157 lb)  SpO2: 98% 96% 92% 95%    Intake/Output Summary (Last 24 hours) at 03/15/15 1349 Last data filed at 03/15/15 1016  Gross per 24 hour  Intake      3 ml  Output      0 ml  Net      3 ml   Filed Weights   03/13/15 1730 03/14/15 0141 03/15/15 0433  Weight: 70.126 kg (154 lb 9.6 oz) 97.3 kg (214 lb 8.1 oz) 71.215 kg (157 lb)    Exam:  General:  mildy anxious, resting in bed  HEENT: no scleral icterus, normocephalic  Cardiovascular: RRR without MRG, 2+ peripheral pulses, no edema  Respiratory: decreased breath sounds with diffuse wheezing biL  MSK/Extremities: no clubbing/cyanosis, no joint swelling  Skin: no rashes  Neuro: non focal  Data Reviewed: Basic Metabolic Panel:  Recent Labs Lab 03/13/15 1741 03/14/15 0245  NA 143 141  K 3.7 4.3  CL 109 110  CO2 25 21*  GLUCOSE 85 130*  BUN 8 11  CREATININE 0.76 0.58  CALCIUM 8.7* 7.8*   Liver Function Tests:  Recent Labs Lab 03/14/15 0245  AST 38  ALT 36  ALKPHOS 78  BILITOT 0.9  PROT 5.3*  ALBUMIN 3.1*   CBC:  Recent Labs Lab 03/13/15 1741 03/14/15 0245  WBC 8.4 7.5  HGB 11.6* 10.7*  HCT 36.7 34.8*  MCV 91.1 93.8  PLT 339 204   CBG:  Recent Labs Lab 03/14/15 0140  GLUCAP 139*    Recent Results (from the past 240 hour(s))  MRSA PCR Screening     Status: Abnormal   Collection  Time: 03/14/15  1:32 AM  Result Value Ref Range Status   MRSA by PCR POSITIVE (A) NEGATIVE Final    Comment:        The GeneXpert MRSA Assay (FDA approved for NASAL specimens only), is one component of a comprehensive MRSA colonization surveillance program. It is not intended to diagnose MRSA infection nor to guide or monitor treatment for MRSA infections. RESULT CALLED TO, READ BACK BY AND VERIFIED WITH: CLIFFTON,J RN [id number] AT 0416 SKEEN,P      Scheduled Meds: . amphetamine-dextroamphetamine  15 mg Oral BID  . antiseptic oral rinse  7 mL Mouth Rinse BID  . Chlorhexidine Gluconate Cloth  6 each Topical Q0600  . diclofenac sodium  2 g Topical QID  . DULoxetine  120 mg Oral Daily  . enoxaparin (LOVENOX) injection  40 mg Subcutaneous Q24H  . gabapentin  300 mg Oral Daily  . gabapentin  900 mg Oral QHS  . ipratropium  0.5 mg Nebulization Q6H  . levalbuterol  1.25 mg Nebulization Q6H  . levofloxacin  750 mg Oral Daily  . methylPREDNISolone (SOLU-MEDROL) injection  80 mg Intravenous 3 times per day  . mupirocin ointment  1 application Nasal BID  . pantoprazole  40 mg Oral Daily  . sodium chloride  3 mL Intravenous Q12H   Continuous Infusions:   Emily Filbert, PA-S  Time Spent: 15 minutes  Marzetta Board, MD [facility] Pager [phone]. If 7 PM - 7 AM, please contact night-coverage at [email or web address], password [facility] 03/15/2015, 1:49 PM  LOS: 2 days

## 2015-03-16 DIAGNOSIS — J441 Chronic obstructive pulmonary disease with (acute) exacerbation: Principal | ICD-10-CM

## 2015-03-16 DIAGNOSIS — Z720 Tobacco use: Secondary | ICD-10-CM

## 2015-03-16 DIAGNOSIS — F411 Generalized anxiety disorder: Secondary | ICD-10-CM

## 2015-03-16 LAB — FERRITIN: Ferritin: 18 ng/mL (ref 11–307)

## 2015-03-16 LAB — IRON AND TIBC
IRON: 50 ug/dL (ref 28–170)
Saturation Ratios: 13 % (ref 10.4–31.8)
TIBC: 375 ug/dL (ref 250–450)
UIBC: 325 ug/dL

## 2015-03-16 LAB — RETICULOCYTES
RBC.: 3.96 MIL/uL (ref 3.87–5.11)
RETIC CT PCT: 1.2 % (ref 0.4–3.1)
Retic Count, Absolute: 47.5 10*3/uL (ref 19.0–186.0)

## 2015-03-16 LAB — VITAMIN B12: Vitamin B-12: 368 pg/mL (ref 180–914)

## 2015-03-16 LAB — FOLATE: Folate: 20.2 ng/mL (ref >5.9)

## 2015-03-16 MED ORDER — LEVALBUTEROL HCL 1.25 MG/0.5ML IN NEBU
0.6300 mg | INHALATION_SOLUTION | Freq: Four times a day (QID) | RESPIRATORY_TRACT | Status: DC
  Administered 2015-03-16: 0.63 mg via RESPIRATORY_TRACT
  Filled 2015-03-16 (×10): qty 0.26

## 2015-03-16 MED ORDER — METHYLPREDNISOLONE SODIUM SUCC 125 MG IJ SOLR
60.0000 mg | Freq: Three times a day (TID) | INTRAMUSCULAR | Status: DC
  Administered 2015-03-16 – 2015-03-17 (×4): 60 mg via INTRAVENOUS
  Filled 2015-03-16 (×6): qty 0.96

## 2015-03-16 MED ORDER — IPRATROPIUM BROMIDE 0.02 % IN SOLN
0.5000 mg | Freq: Four times a day (QID) | RESPIRATORY_TRACT | Status: DC
  Administered 2015-03-16 – 2015-03-17 (×6): 0.5 mg via RESPIRATORY_TRACT
  Filled 2015-03-16 (×6): qty 2.5

## 2015-03-16 MED ORDER — BENZONATATE 100 MG PO CAPS
100.0000 mg | ORAL_CAPSULE | Freq: Three times a day (TID) | ORAL | Status: DC
  Administered 2015-03-16 – 2015-03-17 (×3): 100 mg via ORAL
  Filled 2015-03-16 (×5): qty 1

## 2015-03-16 MED ORDER — LEVALBUTEROL HCL 0.63 MG/3ML IN NEBU
0.6300 mg | INHALATION_SOLUTION | Freq: Four times a day (QID) | RESPIRATORY_TRACT | Status: DC | PRN
  Administered 2015-03-16 – 2015-03-17 (×6): 0.63 mg via RESPIRATORY_TRACT
  Filled 2015-03-16 (×6): qty 3

## 2015-03-16 NOTE — Progress Notes (Signed)
PROGRESS NOTE  [name] OIB:[id number] DOB: 02-17-1961 DOA: 03/13/2015 PCP: Barbette Merino, MD   HPI: [name] is a 54 y.o. female with a history of COPD, asthma, tobacco abuse, PUD, PTSD, migraines, ADHD, MRSA, hiatal hernia, and IBS who presented to the ED 03/13/15 with progressively worsening shortness of breath, wheezing, and occasionally productive cough for a duration of 2 days. She was admitted 03/13/15 for an Acute COPD Exacerbation.   Subjective / 24 H Interval events - Patient reporting having ongoing cough, wheezing and shortness of breath. He does not feel  Assessment/Plan: Active Problems:   Tobacco abuse   COPD exacerbation   Anemia   Anxiety state   COPD exacerbation - on physical exam continues to have poor air movement associate with bilateral extremity wheezes. Will continue IV steroids for the next 24 hours and reassess in a.m.  - the tinea. IV and microbial therapy with Levaquin - guaifenesin   Mild normocytic anemia - stable - obtain anemia panel  Anxiety / PTSD - continue Xanax / Adderal - seeing psychiatry as an outpatient   Tobacco abuse - counseled for cessation   Diet: Diet regular Room service appropriate?: Yes; Fluid consistency:: Thin Fluids: None DVT Prophylaxis: Enoxaparin  Code Status: Full Code Family Communication: None Disposition Plan: anticipate discharged in next 24 hours or she continues to improve  Consultants:  None   Procedures:  None   Antibiotics Levofloxacin 6/6 >>   Studies  No results found.  Objective  Filed Vitals:   03/15/15 2012 03/16/15 0543 03/16/15 0858 03/16/15 1214  BP: 114/64 121/88    Pulse: 93     Temp: 98.5 F (36.9 C) 98.3 F (36.8 C)    TempSrc: Oral Oral    Resp: 20     Height:      Weight:  69.99 kg (154 lb 4.8 oz)    SpO2: 93%  99% 95%    Intake/Output Summary (Last 24 hours) at 03/16/15 1755 Last data filed at 03/16/15 0941  Gross per 24 hour  Intake    203 ml    Output      0 ml  Net    203 ml   Filed Weights   03/14/15 0141 03/15/15 0433 03/16/15 0543  Weight: 97.3 kg (214 lb 8.1 oz) 71.215 kg (157 lb) 69.99 kg (154 lb 4.8 oz)    Exam:  General:  Anxious appearing  HEENT: no scleral icterus, normocephalic  Cardiovascular: RRR without MRG, 2+ peripheral pulses, no edema  Respiratory she has diminished breaths on his bilaterally with the presence of bilateral expiratory wheezes  Extremities: no clubbing/cyanosis, no joint swelling  Skin: no rashes  Neuro: non focal  Data Reviewed: Basic Metabolic Panel:  Recent Labs Lab 03/13/15 1741 03/14/15 0245  NA 143 141  K 3.7 4.3  CL 109 110  CO2 25 21*  GLUCOSE 85 130*  BUN 8 11  CREATININE 0.76 0.58  CALCIUM 8.7* 7.8*   Liver Function Tests:  Recent Labs Lab 03/14/15 0245  AST 38  ALT 36  ALKPHOS 78  BILITOT 0.9  PROT 5.3*  ALBUMIN 3.1*   CBC:  Recent Labs Lab 03/13/15 1741 03/14/15 0245  WBC 8.4 7.5  HGB 11.6* 10.7*  HCT 36.7 34.8*  MCV 91.1 93.8  PLT 339 204   CBG:  Recent Labs Lab 03/14/15 0140  GLUCAP 139*    Recent Results (from the past 240 hour(s))  MRSA PCR Screening     Status: Abnormal  Collection Time: 03/14/15  1:32 AM  Result Value Ref Range Status   MRSA by PCR POSITIVE (A) NEGATIVE Final    Comment:        The GeneXpert MRSA Assay (FDA approved for NASAL specimens only), is one component of a comprehensive MRSA colonization surveillance program. It is not intended to diagnose MRSA infection nor to guide or monitor treatment for MRSA infections. RESULT CALLED TO, READ BACK BY AND VERIFIED WITH: CLIFFTON,J RN [id number] AT 0416 SKEEN,P      Scheduled Meds: . amphetamine-dextroamphetamine  15 mg Oral BID  . antiseptic oral rinse  7 mL Mouth Rinse BID  . benzonatate  100 mg Oral TID  . Chlorhexidine Gluconate Cloth  6 each Topical Q0600  . diclofenac sodium  2 g Topical QID  . DULoxetine  120 mg Oral Daily  . enoxaparin  (LOVENOX) injection  40 mg Subcutaneous Q24H  . gabapentin  300 mg Oral Daily  . gabapentin  900 mg Oral QHS  . ipratropium  0.5 mg Nebulization QID  . levalbuterol  0.63 mg Nebulization QID  . levofloxacin  750 mg Oral Daily  . methylPREDNISolone (SOLU-MEDROL) injection  60 mg Intravenous 3 times per day  . mupirocin ointment  1 application Nasal BID  . pantoprazole  40 mg Oral Daily  . sodium chloride  3 mL Intravenous Q12H   Continuous Infusions:

## 2015-03-16 NOTE — Progress Notes (Signed)
03/16/2015 2:39 AM Patient was having burning pain in chest from indigestion and asked to get Maalox ordered.  The hospitalist was paged and the patient received 30 mL of Maalox.  About 30 minutes later, the patient said her chest pain was gone.  Will continue to monitor the patient. Lupita Dawn, RN

## 2015-03-17 DIAGNOSIS — J189 Pneumonia, unspecified organism: Secondary | ICD-10-CM

## 2015-03-17 DIAGNOSIS — I4891 Unspecified atrial fibrillation: Secondary | ICD-10-CM | POA: Diagnosis present

## 2015-03-17 MED ORDER — LEVOFLOXACIN 750 MG PO TABS: 750.0000 mg | ORAL_TABLET | Freq: Every day | ORAL | Status: DC

## 2015-03-17 MED ORDER — HYDROCODONE-ACETAMINOPHEN 5-325 MG PO TABS: 1.0000 | ORAL_TABLET | Freq: Four times a day (QID) | ORAL | Status: DC | PRN

## 2015-03-17 MED ORDER — BENZONATATE 100 MG PO CAPS: 100.0000 mg | ORAL_CAPSULE | Freq: Three times a day (TID) | ORAL | Status: DC

## 2015-03-17 MED ORDER — PREDNISONE 10 MG (21) PO TBPK: ORAL_TABLET | ORAL | Status: DC

## 2015-03-17 MED ORDER — PROMETHAZINE HCL 25 MG/ML IJ SOLN
12.5000 mg | Freq: Once | INTRAMUSCULAR | Status: AC
  Administered 2015-03-17: 12.5 mg via INTRAVENOUS
  Filled 2015-03-17: qty 1

## 2015-03-17 NOTE — Discharge Summary (Signed)
Physician Discharge Summary  [name] GMW:[id number] DOB: [birth year] DOA: 03/13/2015  PCP: Barbette Merino, MD  Admit date: 03/13/2015 Discharge date: 03/17/2015  Time spent: 35 minutes  Recommendations for Outpatient Follow-up:  1. Please follow-up on patient's heart rates, during this hospitalization went into A. fib with RVR that was likely precipitated by community-acquired pneumonia 2. Follow-up on blood pressures 3. BMP and CBC on hospital follow-up visit  Discharge Diagnoses:  Principal Problem:   COPD exacerbation Active Problems:   Tobacco abuse   Anemia   Anxiety state   Discharge Condition: Stable/improved  Diet recommendation: Heart healthy  Filed Weights   03/15/15 0433 03/16/15 0543 03/17/15 0358  Weight: 71.215 kg (157 lb) 69.99 kg (154 lb 4.8 oz) 71.5 kg (157 lb 10.1 oz)    History of present illness:  54 yo female with Copd not on home o2, apparently c/o dyspnea x 2 days. Worse today. + cough with occasional yellow sputum. Slight sscp "sharp" with cough. Today. Denies fever, chills, palp, n/v, diarrhea. Pt presented to ED, had CXR negative. Pt will be admitted for Copd exacerbation.   Hospital Course:  Patient is a pleasant 54 year old with a past medical history of chronic constructive pulmonary disease, ongoing tobacco use admitted to the medicine service on 03/14/2015 when she presented with complaints of cough, shortness of breath, wheezing. Symptoms attributed to COPD exacerbation. She was started on systemic steroids with IV Solu-Medrol along with empiric IV antibiotic therapy with Levaquin. A chest x-ray performed on 03/13/2015 showed stable findings of chronic of record pulmonary disease without acute findings identified. She showed gradual improvement and by 03/17/2015 was ambulating around the nurse's station maintaining her O2 sats in the mid 90s on room air. She remained hemodynamically stable and had been tolerating by mouth intake. She was  discharged on a prednisone taper along with 2 days of Levaquin by mouth. Patient was counseled on tobacco abuse cessation.  Discharge Exam: Filed Vitals:   03/17/15 0358  BP: 118/70  Pulse: 91  Temp: 98.3 F (36.8 C)  Resp: 18    General: Patient is nontoxic appearing, was able to ambulate around the nurse's station, tolerating by mouth intake complains of back pain. Cardiovascular: Regular rate and rhythm normal S1-S2 no murmurs rubs or gallops Respiratory: Diminished breath sounds bilaterally, having a few expiratory wheezes with overall improvement from yesterday's examination Abdomen: Soft nontender nondistended Extremities: No edema  Discharge Instructions   Discharge Instructions    Call MD for:  difficulty breathing, headache or visual disturbances    Complete by:  As directed      Call MD for:  extreme fatigue    Complete by:  As directed      Call MD for:  hives    Complete by:  As directed      Call MD for:  persistant dizziness or light-headedness    Complete by:  As directed      Call MD for:  persistant nausea and vomiting    Complete by:  As directed      Call MD for:  redness, tenderness, or signs of infection (pain, swelling, redness, odor or green/yellow discharge around incision site)    Complete by:  As directed      Call MD for:  severe uncontrolled pain    Complete by:  As directed      Call MD for:  temperature >100.4    Complete by:  As directed      Call MD for:  Complete by:  As directed      Diet - low sodium heart healthy    Complete by:  As directed      Increase activity slowly    Complete by:  As directed           Current Discharge Medication List    START taking these medications   Details  benzonatate (TESSALON) 100 MG capsule Take 1 capsule (100 mg total) by mouth 3 (three) times daily. Qty: 20 capsule, Refills: 0    levofloxacin (LEVAQUIN) 750 MG tablet Take 1 tablet (750 mg total) by mouth daily. Qty: 2 tablet, Refills: 0     predniSONE (STERAPRED UNI-PAK 21 TAB) 10 MG (21) TBPK tablet Take 6-5-4-3-2-1 tablets by mouth daily till gone. Qty: 21 tablet, Refills: 0      CONTINUE these medications which have NOT CHANGED   Details  albuterol (PROVENTIL,VENTOLIN) 90 MCG/ACT inhaler Inhale 2 puffs into the lungs every 6 (six) hours as needed. For shortness of breath    ALPRAZolam (XANAX) 1 MG tablet Take 1-2 mg by mouth 4 (four) times daily. Patient takes 1 tablet twice a day and 2 tablets at bedtime    amphetamine-dextroamphetamine (ADDERALL XR) 15 MG 24 hr capsule Take 15 mg by mouth 2 (two) times daily.     dicyclomine (BENTYL) 20 MG tablet Take 20 mg by mouth 3 (three) times daily as needed for spasms.     DULoxetine (CYMBALTA) 60 MG capsule Take 120 mg by mouth every morning.     esomeprazole (NEXIUM) 40 MG capsule Take 1 capsule (40 mg total) by mouth 2 (two) times daily before a meal. Qty: 60 capsule, Refills: 3    gabapentin (NEURONTIN) 300 MG capsule Take 300-900 mg by mouth 2 (two) times daily. 1 capsule in the AM and 3 capsules at bedtime    mirtazapine (REMERON) 15 MG tablet Take 1 tablet by mouth at bedtime as needed (for rest).     traMADol (ULTRAM) 50 MG tablet Take 50 mg by mouth every 6 (six) hours as needed for moderate pain.     docusate sodium 100 MG CAPS Take 100 mg by mouth 2 (two) times daily.      STOP taking these medications     ciprofloxacin (CIPRO) 500 MG tablet      dicyclomine (BENTYL) 10 MG capsule        Allergies  Allergen Reactions  . Penicillins Anaphylaxis    REACTION: anaphylaxis  . Sulfa Antibiotics Itching  . Aspirin Other (See Comments)    Burning of stomach. REACTION: GI upset  . Ibuprofen Nausea Only    Upset stomach due to acid reflux  . Lactose Intolerance (Gi) Diarrhea  . Morphine And Related Itching  . Buprenorphine Hcl Itching   Follow-up Information    Follow up with GARBA,LAWAL, MD In 1 week.   Specialty:  Internal Medicine   Contact  information:   [address] [place] [address] [phone]        The results of significant diagnostics from this hospitalization (including imaging, microbiology, ancillary and laboratory) are listed below for reference.    Significant Diagnostic Studies: Dg Chest 2 View (if Patient Has Fever And/or Copd)  03/13/2015   CLINICAL DATA:  Shortness of breath with productive cough and wheezing for 3 days. Initial encounter.  EXAM: CHEST  2 VIEW  COMPARISON:  Radiographs 04/18/2014.  FINDINGS: The heart size and mediastinal contours are stable. There are moderate upper lobe emphysematous changes  with chronic scarring or atelectasis at the left lung base. No edema, confluent airspace opacity or significant pleural effusion demonstrated. There is no pneumothorax. Telemetry leads overlie the chest. There are postsurgical changes status post lower cervical fusion.  IMPRESSION: Stable findings of chronic obstructive pulmonary disease. No acute findings identified.   Electronically Signed   By: Richardean Sale M.D.   On: 03/13/2015 18:38    Microbiology: Recent Results (from the past 240 hour(s))  MRSA PCR Screening     Status: Abnormal   Collection Time: 03/14/15  1:32 AM  Result Value Ref Range Status   MRSA by PCR POSITIVE (A) NEGATIVE Final    Comment:        The GeneXpert MRSA Assay (FDA approved for NASAL specimens only), is one component of a comprehensive MRSA colonization surveillance program. It is not intended to diagnose MRSA infection nor to guide or monitor treatment for MRSA infections. RESULT CALLED TO, READ BACK BY AND VERIFIED WITH: CLIFFTON,J RN [id number] AT 0416 SKEEN,P      Labs: Basic Metabolic Panel:  Recent Labs Lab 03/13/15 1741 03/14/15 0245  NA 143 141  K 3.7 4.3  CL 109 110  CO2 25 21*  GLUCOSE 85 130*  BUN 8 11  CREATININE 0.76 0.58  CALCIUM 8.7* 7.8*   Liver Function Tests:  Recent Labs Lab 03/14/15 0245  AST 38  ALT 36  ALKPHOS 78   BILITOT 0.9  PROT 5.3*  ALBUMIN 3.1*   No results for input(s): LIPASE, AMYLASE in the last 168 hours. No results for input(s): AMMONIA in the last 168 hours. CBC:  Recent Labs Lab 03/13/15 1741 03/14/15 0245  WBC 8.4 7.5  HGB 11.6* 10.7*  HCT 36.7 34.8*  MCV 91.1 93.8  PLT 339 204   Cardiac Enzymes: No results for input(s): CKTOTAL, CKMB, CKMBINDEX, TROPONINI in the last 168 hours. BNP: BNP (last 3 results) No results for input(s): BNP in the last 8760 hours.  ProBNP (last 3 results) No results for input(s): PROBNP in the last 8760 hours.  CBG:  Recent Labs Lab 03/14/15 0140  GLUCAP 139*       Signed:  Patrick Salemi  [facility] 03/17/2015, 10:41 AM

## 2015-03-17 NOTE — Progress Notes (Signed)
03/17/2015 3:28 PM D/c avs form, medications already taken today and those due this evening given and explained to patient. Follow up appointments and when to call MD reviewed. RX reviewed. D/c iv. D/c tele. D/c home per orders.  Kym Scannell, Arville Lime

## 2015-03-17 NOTE — Care Management Note (Signed)
Case Management Note  Patient Details  Name: [name] MRN: [id number] Date of Birth: 29-Aug-1961  Subjective/Objective:   Pt was admitted with COPD excerbation                 Action/Plan:  Pt is independent from home aloneCM will continue to  Monitor for disposition needs   Expected Discharge Date:                  Expected Discharge Plan:  Home/Self Care  In-House Referral:     Discharge planning Services  CM Consult  Post Acute Care Choice:    Choice offered to:     DME Arranged:    DME Agency:     HH Arranged:    [place] Agency:     Status of Service: Complete, will sign off  Medicare Important Message Given:  Yes Date Medicare IM Given:  03/15/15 Medicare IM give by:  Elenor Quinones Date Additional Medicare IM Given:   03/17/15 Additional Medicare Important Message give by:   Elenor Quinones  If discussed at Long Length of Stay Meetings, dates discussed:    Additional Comments:  Maryclare Labrador, RN 03/17/2015, 9:17 PM

## 2015-05-08 NOTE — Progress Notes (Signed)
REVIEWED-NO ADDITIONAL RECOMMENDATIONS. 

## 2015-07-21 ENCOUNTER — Encounter (HOSPITAL_COMMUNITY): Payer: Self-pay | Admitting: *Deleted

## 2015-07-21 ENCOUNTER — Emergency Department (HOSPITAL_COMMUNITY): Payer: Medicare Other

## 2015-07-21 ENCOUNTER — Emergency Department (HOSPITAL_COMMUNITY)
Admission: EM | Admit: 2015-07-21 | Discharge: 2015-07-21 | Disposition: A | Discharge status: Home / Self Care | Payer: Medicare Other | Attending: Emergency Medicine | Admitting: Emergency Medicine

## 2015-07-21 DIAGNOSIS — Z880 Allergy status to penicillin: Secondary | ICD-10-CM | POA: Insufficient documentation

## 2015-07-21 DIAGNOSIS — S6991XA Unspecified injury of right wrist, hand and finger(s), initial encounter: Secondary | ICD-10-CM | POA: Insufficient documentation

## 2015-07-21 DIAGNOSIS — Z720 Tobacco use: Secondary | ICD-10-CM | POA: Insufficient documentation

## 2015-07-21 DIAGNOSIS — J449 Chronic obstructive pulmonary disease, unspecified: Secondary | ICD-10-CM | POA: Diagnosis not present

## 2015-07-21 DIAGNOSIS — K589 Irritable bowel syndrome without diarrhea: Secondary | ICD-10-CM | POA: Insufficient documentation

## 2015-07-21 DIAGNOSIS — Y998 Other external cause status: Secondary | ICD-10-CM | POA: Diagnosis not present

## 2015-07-21 DIAGNOSIS — G43909 Migraine, unspecified, not intractable, without status migrainosus: Secondary | ICD-10-CM | POA: Insufficient documentation

## 2015-07-21 DIAGNOSIS — W010XXA Fall on same level from slipping, tripping and stumbling without subsequent striking against object, initial encounter: Secondary | ICD-10-CM | POA: Insufficient documentation

## 2015-07-21 DIAGNOSIS — F4310 Post-traumatic stress disorder, unspecified: Secondary | ICD-10-CM | POA: Diagnosis not present

## 2015-07-21 DIAGNOSIS — Z8679 Personal history of other diseases of the circulatory system: Secondary | ICD-10-CM | POA: Insufficient documentation

## 2015-07-21 DIAGNOSIS — Z8614 Personal history of Methicillin resistant Staphylococcus aureus infection: Secondary | ICD-10-CM | POA: Insufficient documentation

## 2015-07-21 DIAGNOSIS — Y92002 Bathroom of unspecified non-institutional (private) residence single-family (private) house as the place of occurrence of the external cause: Secondary | ICD-10-CM | POA: Diagnosis not present

## 2015-07-21 DIAGNOSIS — G8929 Other chronic pain: Secondary | ICD-10-CM | POA: Insufficient documentation

## 2015-07-21 DIAGNOSIS — Z792 Long term (current) use of antibiotics: Secondary | ICD-10-CM | POA: Insufficient documentation

## 2015-07-21 DIAGNOSIS — S0990XA Unspecified injury of head, initial encounter: Secondary | ICD-10-CM | POA: Insufficient documentation

## 2015-07-21 DIAGNOSIS — M21941 Unspecified acquired deformity of hand, right hand: Secondary | ICD-10-CM | POA: Insufficient documentation

## 2015-07-21 DIAGNOSIS — F909 Attention-deficit hyperactivity disorder, unspecified type: Secondary | ICD-10-CM | POA: Insufficient documentation

## 2015-07-21 DIAGNOSIS — M79641 Pain in right hand: Secondary | ICD-10-CM

## 2015-07-21 DIAGNOSIS — S3992XA Unspecified injury of lower back, initial encounter: Secondary | ICD-10-CM | POA: Insufficient documentation

## 2015-07-21 DIAGNOSIS — Z79899 Other long term (current) drug therapy: Secondary | ICD-10-CM | POA: Insufficient documentation

## 2015-07-21 DIAGNOSIS — Z9889 Other specified postprocedural states: Secondary | ICD-10-CM | POA: Insufficient documentation

## 2015-07-21 DIAGNOSIS — Z8711 Personal history of peptic ulcer disease: Secondary | ICD-10-CM | POA: Diagnosis not present

## 2015-07-21 DIAGNOSIS — Z7952 Long term (current) use of systemic steroids: Secondary | ICD-10-CM | POA: Insufficient documentation

## 2015-07-21 DIAGNOSIS — Y9389 Activity, other specified: Secondary | ICD-10-CM | POA: Diagnosis not present

## 2015-07-21 DIAGNOSIS — M549 Dorsalgia, unspecified: Secondary | ICD-10-CM

## 2015-07-21 MED ORDER — TRAMADOL HCL 50 MG PO TABS: 50.0000 mg | ORAL_TABLET | Freq: Four times a day (QID) | ORAL | Status: DC | PRN

## 2015-07-21 MED ORDER — TRAMADOL HCL 50 MG PO TABS
100.0000 mg | ORAL_TABLET | Freq: Once | ORAL | Status: AC
  Administered 2015-07-21: 100 mg via ORAL
  Filled 2015-07-21: qty 2

## 2015-07-21 MED ORDER — PROMETHAZINE HCL 12.5 MG PO TABS
12.5000 mg | ORAL_TABLET | Freq: Once | ORAL | Status: AC
  Administered 2015-07-21: 12.5 mg via ORAL
  Filled 2015-07-21: qty 1

## 2015-07-21 NOTE — ED Provider Notes (Signed)
CSN: [id number]     Arrival date & time 07/21/15  1749 History   First MD Initiated Contact with Patient 07/21/15 1944     Chief Complaint  Patient presents with  . Fall     (Consider location/radiation/quality/duration/timing/severity/associated sxs/prior Treatment) HPI Comments: Patient is a 54 year old female presents to the emergency department with a complaint of pain of the right hand and also the right lower back area.  The patient states that she slipped on water while in the bathroom. She was attempting to break her fall when she grabbed what she believes to be a Tylenol fracture in the bathroom. She sustained injury and pain to the palm of her hand. She is concerned because she had surgery on the right hand in the past, and has had some nerve damage to a finger on the right hand. The patient also describes some pain in the lower back that she says feels like spasm. She did not hit her head. She denies being on any anticoagulation medications, or having any bleeding disorders. She presents now for evaluation of these issues. She has not taken any medication for this up to this point.  Patient is a 54 y.o. female presenting with fall. The history is provided by the patient.  Fall This is a new problem. The current episode started today. Associated symptoms include arthralgias and headaches.    Past Medical History  Diagnosis Date  . S/P colonoscopy 06/02/09    normal ([name])  . PUD (peptic ulcer disease) 01/2007    EGD Dr [name], 2 antral ulcers, negative h pylori  . MRSA infection     Dr. [name], currently under treatment  . Hiatal hernia 2008    on EGD above  . Asthma   . PTSD (post-traumatic stress disorder)   . Migraines   . Degenerative disc disease   . Rectal prolapse   . SBO (small bowel obstruction) ([place])   . ADHD (attention deficit hyperactivity disorder)   . S/P endoscopy 03/26/11    gastritis-Dr [name]  . Clostridium difficile colitis 04/10/11  &11/2011    tx w/ flagyl  . Chronic abdominal pain 2003    EX LAP APR 2004 RUPTURE L OV CYST, JUL 2004 ADHESIONS  . Irritable bowel syndrome 2004 CONSTIPATION  . BMI (body mass index) 20.0-29.9 2009 127 LBS  . COPD (chronic obstructive pulmonary disease) ([place])   . Chronic nausea   . Anginal pain [facility])    Past Surgical History  Procedure Laterality Date  . Bowel resection      x2, secondary to adhesions  . Appendectomy    . Laparoscopic lysis intestinal adhesions    . Back surgery    . Abdominal hysterectomy    . Tubal ligation    . Umbilical hernia repair  2008    x5  . Neck surgery  2009  . Partial hysterectomy    . Upper gastrointestinal endoscopy  APR 2008 [facility] BLEEDING/PAIN    PUD  . Upper gastrointestinal endoscopy  JUL 2012 [facility] PAIN    MILD GASTRITIS  . Esophagogastroduodenoscopy  03/23/2011     Normal esophagus without evidence of Barrett's, mass, erosions, or ulcerations./ Patchy erythema with occasional erosion in the antrum.  Biopsies  obtained via cold forceps to evaluate for H. pylori gastritis/ Small hiatal hernia./Normal duodenal bulb and second portion of the duodenum.  . Cervical spine surgery    . Flexible sigmoidoscopy  07/14/2002    Normal limited flexible sigmoidoscopy with stool in the  rectum and rectosigmoid precluding a full colonoscopy  . Colonoscopy  03/21/2012     RWE:RXVQMG ADENOMA(1) POOR PREP  . Esophagogastroduodenoscopy  03/21/2012    QQP:YPPJK Hiatal hernia/ABDOMINALPAIN/DIARRHEA MOST LIKELY DUE TO IBS, GASTRITIS DUODENITIS  . Colonoscopy N/A 11/17/2013    DTO:IZTIWP mucosa in the terminal ileum/NORMAL surgical anastomosis/Small internal hemorrhoids   Family History  Problem Relation Age of Onset  . Adopted: Yes   Social History  Substance Use Topics  . Smoking status: Current Every Day Smoker -- 0.50 packs/day for 10 years    Types: Cigarettes  . Smokeless tobacco: Former Systems developer  . Alcohol Use: No   OB History    Gravida Para Term Preterm  AB TAB SAB Ectopic Multiple Living   1 1 1       1      Review of Systems  Musculoskeletal: Positive for back pain and arthralgias.  Neurological: Positive for headaches.  All other systems reviewed and are negative.     Allergies  Penicillins; Sulfa antibiotics; Aspirin; Ibuprofen; Lactose intolerance (gi); Morphine and related; and Buprenorphine hcl  Home Medications   Prior to Admission medications   Medication Sig Start Date End Date Taking? Authorizing Provider  albuterol (PROVENTIL,VENTOLIN) 90 MCG/ACT inhaler Inhale 2 puffs into the lungs every 6 (six) hours as needed. For shortness of breath    Historical Provider, MD  ALPRAZolam Duanne Moron) 1 MG tablet Take 1-2 mg by mouth 4 (four) times daily. Patient takes 1 tablet twice a day and 2 tablets at bedtime    Historical Provider, MD  amphetamine-dextroamphetamine (ADDERALL XR) 15 MG 24 hr capsule Take 15 mg by mouth 2 (two) times daily.     Historical Provider, MD  benzonatate (TESSALON) 100 MG capsule Take 1 capsule (100 mg total) by mouth 3 (three) times daily. 03/17/15   Kelvin Cellar, MD  dicyclomine (BENTYL) 20 MG tablet Take 20 mg by mouth 3 (three) times daily as needed for spasms.  01/03/15   Historical Provider, MD  docusate sodium 100 MG CAPS Take 100 mg by mouth 2 (two) times daily. Patient not taking: Reported on 12/07/2014 08/22/13   [name], MD  DULoxetine (CYMBALTA) 60 MG capsule Take 120 mg by mouth every morning.     Historical Provider, MD  esomeprazole (NEXIUM) 40 MG capsule Take 1 capsule (40 mg total) by mouth 2 (two) times daily before a meal. 05/17/14   [name], PA-C  gabapentin (NEURONTIN) 300 MG capsule Take 300-900 mg by mouth 2 (two) times daily. 1 capsule in the AM and 3 capsules at bedtime    Historical Provider, MD  HYDROcodone-acetaminophen (NORCO/VICODIN) 5-325 MG per tablet Take 1 tablet by mouth every 6 (six) hours as needed for moderate pain. 03/17/15   Kelvin Cellar, MD  levofloxacin  (LEVAQUIN) 750 MG tablet Take 1 tablet (750 mg total) by mouth daily. 03/17/15   Kelvin Cellar, MD  mirtazapine (REMERON) 15 MG tablet Take 1 tablet by mouth at bedtime as needed (for rest).  08/17/13   Historical Provider, MD  predniSONE (STERAPRED UNI-PAK 21 TAB) 10 MG (21) TBPK tablet Take 6-5-4-3-2-1 tablets by mouth daily till gone. 03/17/15   Kelvin Cellar, MD  traMADol (ULTRAM) 50 MG tablet Take 50 mg by mouth every 6 (six) hours as needed for moderate pain.  01/03/15   Historical Provider, MD   BP 109/61 mmHg  Pulse 75  Temp(Src) 98.2 F (36.8 C) (Oral)  Resp 16  Ht 5\' 5"  (1.651 m)  Wt  157 lb (71.215 kg)  BMI 26.13 kg/m2  SpO2 96% Physical Exam  Constitutional: She is oriented to person, place, and time. She appears well-developed and well-nourished.  Non-toxic appearance.  HENT:  Head: Normocephalic.  Right Ear: Tympanic membrane and external ear normal.  Left Ear: Tympanic membrane and external ear normal.  Eyes: EOM and lids are normal. Pupils are equal, round, and reactive to light.  Neck: Normal range of motion. Neck supple. Carotid bruit is not present.  Cardiovascular: Normal rate, regular rhythm, normal heart sounds, intact distal pulses and normal pulses.   Pulmonary/Chest: Breath sounds normal. No respiratory distress.  Abdominal: Soft. Bowel sounds are normal. There is no tenderness. There is no guarding.  Musculoskeletal: Normal range of motion.       Lumbar back: She exhibits tenderness.       Back:       Right hand: She exhibits bony tenderness and deformity. She exhibits normal capillary refill and no laceration. Normal sensation noted.  Deformity from previous surgery and chronic tendon problems.  Lymphadenopathy:       Head (right side): No submandibular adenopathy present.       Head (left side): No submandibular adenopathy present.    She has no cervical adenopathy.  Neurological: She is alert and oriented to person, place, and time. She has normal  strength. No cranial nerve deficit or sensory deficit. Coordination and gait normal.  Gait is steady and intact. No foot drop appreciated. No motor sensory deficits of the lower extremities.  Skin: Skin is warm and dry.  Psychiatric: She has a normal mood and affect. Her speech is normal.  Nursing note and vitals reviewed.   ED Course  Procedures (including critical care time) Labs Review Labs Reviewed - No data to display  Imaging Review Dg Hand Complete Right  07/21/2015  CLINICAL DATA:  Fall in bathroom. Pain in the palm of the hand. Prior surgery in the hand with remote permanent damage to the small finger. EXAM: RIGHT HAND - COMPLETE 3+ VIEW COMPARISON:  11/19/2007 and report from 08/08/2011 FINDINGS: Deformity in the fifth metacarpal from old fracture. Chronic flexion of the small finger proximal interphalangeal joint and chronic extension at the fifth metacarpophalangeal joint. Mild spurring at the first carpometacarpal articulation. No acute bony findings. IMPRESSION: 1. 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 2. No acute findings. Electronically Signed   By: Van Clines M.D.   On: 07/21/2015 18:30   I have personally reviewed and evaluated these images and lab results as part of my medical decision-making.   EKG Interpretation None      MDM  X-ray of the right hand reveals an old healed fifth metacarpal fracture with some a flexed PIP joint consistent with a boutonniere's deformity. No acute fracture or dislocation appreciated.   The examination is consistent with a contusion and pain of the right hand, as well as back pain and possible muscle strain. Patient will be treated with Ultram every 6 hours. Patient is to see her primary physician as sone as possible.    Final diagnoses:  Pain of right hand  Back pain, unspecified location    **I have reviewed nursing notes,  vital signs, and all appropriate lab and imaging results for this patient.Lily Kocher, PA-C 07/27/15 1011  Milton Ferguson, MD 07/27/15 [phone]

## 2015-07-21 NOTE — ED Notes (Addendum)
Pt states she slipped in water while in the bathroom, grabbed towel wreck and it came down as well. States pain to the palm of the hand, radiating up forearm. Pt states past hx of surgery to right hand with permanent damage to right 5th finger. Unable to grip objects well. Pain also to right side of back.

## 2015-07-21 NOTE — Discharge Instructions (Signed)
Your x-ray is negative for fracture or dislocation. Please call Dr. Jonelle Sidle tomorrow to discuss a plan for pain management.

## 2015-08-01 ENCOUNTER — Other Ambulatory Visit: Payer: Self-pay | Admitting: Sports Medicine

## 2015-08-01 DIAGNOSIS — M542 Cervicalgia: Secondary | ICD-10-CM

## 2015-12-28 ENCOUNTER — Ambulatory Visit: Payer: Medicare Other | Admitting: Gastroenterology

## 2016-07-30 ENCOUNTER — Emergency Department (HOSPITAL_COMMUNITY): Payer: Medicare Other

## 2016-07-30 ENCOUNTER — Encounter (HOSPITAL_COMMUNITY): Payer: Self-pay | Admitting: Emergency Medicine

## 2016-07-30 ENCOUNTER — Emergency Department (HOSPITAL_COMMUNITY)
Admission: EM | Admit: 2016-07-30 | Discharge: 2016-07-30 | Disposition: A | Discharge status: Home / Self Care | Payer: Medicare Other | Attending: Emergency Medicine | Admitting: Emergency Medicine

## 2016-07-30 DIAGNOSIS — L03211 Cellulitis of face: Secondary | ICD-10-CM | POA: Diagnosis not present

## 2016-07-30 DIAGNOSIS — K1120 Sialoadenitis, unspecified: Secondary | ICD-10-CM | POA: Diagnosis not present

## 2016-07-30 DIAGNOSIS — K1121 Acute sialoadenitis: Secondary | ICD-10-CM

## 2016-07-30 DIAGNOSIS — J45909 Unspecified asthma, uncomplicated: Secondary | ICD-10-CM | POA: Insufficient documentation

## 2016-07-30 DIAGNOSIS — R220 Localized swelling, mass and lump, head: Secondary | ICD-10-CM | POA: Diagnosis present

## 2016-07-30 DIAGNOSIS — F909 Attention-deficit hyperactivity disorder, unspecified type: Secondary | ICD-10-CM | POA: Diagnosis not present

## 2016-07-30 DIAGNOSIS — J449 Chronic obstructive pulmonary disease, unspecified: Secondary | ICD-10-CM | POA: Insufficient documentation

## 2016-07-30 DIAGNOSIS — Z79899 Other long term (current) drug therapy: Secondary | ICD-10-CM | POA: Diagnosis not present

## 2016-07-30 DIAGNOSIS — R112 Nausea with vomiting, unspecified: Secondary | ICD-10-CM | POA: Insufficient documentation

## 2016-07-30 DIAGNOSIS — F17210 Nicotine dependence, cigarettes, uncomplicated: Secondary | ICD-10-CM | POA: Insufficient documentation

## 2016-07-30 DIAGNOSIS — R791 Abnormal coagulation profile: Secondary | ICD-10-CM | POA: Diagnosis not present

## 2016-07-30 LAB — CBC WITH DIFFERENTIAL/PLATELET
BASOS ABS: 0.1 10*3/uL (ref 0.0–0.1)
BASOS PCT: 1 %
EOS ABS: 0.4 10*3/uL (ref 0.0–0.7)
Eosinophils Relative: 4 %
HEMATOCRIT: 37.3 % (ref 36.0–46.0)
HEMOGLOBIN: 11.4 g/dL — AB (ref 12.0–15.0)
Lymphocytes Relative: 23 %
Lymphs Abs: 2.3 10*3/uL (ref 0.7–4.0)
MCH: 27.9 pg (ref 26.0–34.0)
MCHC: 30.6 g/dL (ref 30.0–36.0)
MCV: 91.4 fL (ref 78.0–100.0)
Monocytes Absolute: 1 10*3/uL (ref 0.1–1.0)
Monocytes Relative: 10 %
NEUTROS ABS: 6.5 10*3/uL (ref 1.7–7.7)
NEUTROS PCT: 62 %
Platelets: 332 10*3/uL (ref 150–400)
RBC: 4.08 MIL/uL (ref 3.87–5.11)
RDW: 15.1 % (ref 11.5–15.5)
WBC: 10.2 10*3/uL (ref 4.0–10.5)

## 2016-07-30 LAB — BASIC METABOLIC PANEL
ANION GAP: 4 — AB (ref 5–15)
BUN: 22 mg/dL — ABNORMAL HIGH (ref 6–20)
CHLORIDE: 102 mmol/L (ref 101–111)
CO2: 32 mmol/L (ref 22–32)
CREATININE: 0.69 mg/dL (ref 0.44–1.00)
Calcium: 9 mg/dL (ref 8.9–10.3)
GFR calc non Af Amer: >60 mL/min (ref >60)
Glucose, Bld: 92 mg/dL (ref 65–99)
Potassium: 4.4 mmol/L (ref 3.5–5.1)
SODIUM: 138 mmol/L (ref 135–145)

## 2016-07-30 LAB — LACTIC ACID, PLASMA
LACTIC ACID, VENOUS: 0.9 mmol/L (ref 0.5–1.9)
LACTIC ACID, VENOUS: 0.9 mmol/L (ref 0.5–1.9)

## 2016-07-30 LAB — PROTIME-INR
INR: 0.92
PROTHROMBIN TIME: 12.4 s (ref 11.4–15.2)

## 2016-07-30 MED ORDER — CLINDAMYCIN PHOSPHATE 600 MG/50ML IV SOLN
600.0000 mg | Freq: Once | INTRAVENOUS | Status: AC
  Administered 2016-07-30: 600 mg via INTRAVENOUS
  Filled 2016-07-30: qty 50

## 2016-07-30 MED ORDER — CLINDAMYCIN HCL 150 MG PO CAPS: 150.0000 mg | ORAL_CAPSULE | Freq: Four times a day (QID) | ORAL | 0 refills | Status: AC

## 2016-07-30 MED ORDER — FENTANYL CITRATE (PF) 100 MCG/2ML IJ SOLN
25.0000 ug | Freq: Once | INTRAMUSCULAR | Status: AC
  Administered 2016-07-30: 25 ug via INTRAVENOUS
  Filled 2016-07-30: qty 2

## 2016-07-30 MED ORDER — SODIUM CHLORIDE 0.9 % IV BOLUS (SEPSIS)
1000.0000 mL | Freq: Once | INTRAVENOUS | Status: AC
  Administered 2016-07-30: 1000 mL via INTRAVENOUS

## 2016-07-30 MED ORDER — IOPAMIDOL (ISOVUE-300) INJECTION 61%
75.0000 mL | Freq: Once | INTRAVENOUS | Status: AC | PRN
  Administered 2016-07-30: 75 mL via INTRAVENOUS

## 2016-07-30 MED ORDER — FENTANYL CITRATE (PF) 100 MCG/2ML IJ SOLN
50.0000 ug | Freq: Once | INTRAMUSCULAR | Status: AC
  Administered 2016-07-30: 50 ug via INTRAVENOUS
  Filled 2016-07-30: qty 2

## 2016-07-30 MED ORDER — HYDROCODONE-ACETAMINOPHEN 5-325 MG PO TABS: 1.0000 | ORAL_TABLET | Freq: Four times a day (QID) | ORAL | 0 refills | Status: DC | PRN

## 2016-07-30 MED ORDER — HYDROCODONE-ACETAMINOPHEN 5-325 MG PO TABS
1.0000 | ORAL_TABLET | Freq: Once | ORAL | Status: AC
  Administered 2016-07-30: 1 via ORAL
  Filled 2016-07-30: qty 1

## 2016-07-30 NOTE — ED Notes (Signed)
MD at bedside. 

## 2016-07-30 NOTE — ED Triage Notes (Signed)
Pt reports abscess and redness to left ear/jaw.  Pt noticed this last night and it is impairing hearing.  No fever in triage.

## 2016-07-30 NOTE — ED Notes (Signed)
Patient transported to CT 

## 2016-07-30 NOTE — ED Provider Notes (Signed)
[facility] DEPT Provider Note   CSN: [id number] Arrival date & time: 07/30/16  1339     History   Chief Complaint Chief Complaint  Patient presents with  . Abscess    HPI [name] is a 55 y.o. female with a past medical history significant for asthma, COPD, history of sinusitis, migraines, prior cervical spine surgery who presents with left cheek/neck/ear pain and swelling. Patient reports that 2 days ago, she was at her baseline. She says that yesterday, she noticed pain just inferior to her left ear. She denies any scrapes, trauma, or new ear piercings. She says that the area of pain started becoming more red, swollen, and began spreading. She says the swelling feels like it is now under her left jaw, she has difficulty opening her jaw fully, and has pain radiating into her left face. She says that the pain is going superior to her ear as well. She says she has decreased hearing and some blurry vision in the left eye. She does not have any pain in her nose or upper sinuses. She denies any left molar infections or problems but does say she needs a tooth taken out from her frontal jaw. She is not having pain in this location. She reports that her pain is a 9 out of 10 severity, and is causing pain with swallowing. She denies report some nausea but vomited 2 days ago. She denies conservation, diarrhea, dysuria, or cough. She denies any chest pain or shortness of breath. Patient does report a mild headache.   She denies any other complaints today.   The history is provided by the patient and medical records. No language interpreter was used.  Abscess  Location:  Head/neck Head/neck abscess location:  L neck and L ear Abscess quality: fluctuance, induration, painful, redness and warmth   Abscess quality: not draining   Duration:  2 days Progression:  Worsening Pain details:    Quality:  Sharp   Severity:  Severe   Duration:  2 days   Timing:  Constant   Progression:   Worsening Chronicity:  New Context: not insect bite/sting and not skin injury   Relieved by:  Nothing Worsened by:  Nothing Ineffective treatments:  None tried Associated symptoms: fatigue, headaches, nausea and vomiting   Associated symptoms: no fever   Headaches:    Severity:  Mild   Onset quality:  Gradual   Duration:  2 days   Timing:  Constant Nausea:    Severity:  Mild   Onset quality:  Gradual Vomiting:    Timing:  Intermittent   Progression:  Resolved Risk factors: hx of MRSA     Past Medical History:  Diagnosis Date  . ADHD (attention deficit hyperactivity disorder)   . Anginal pain ([place])   . Asthma   . BMI (body mass index) 20.0-29.9 2009 127 LBS  . Chronic abdominal pain 2003   EX LAP APR 2004 RUPTURE L OV CYST, JUL 2004 ADHESIONS  . Chronic nausea   . Clostridium difficile colitis 04/10/11 &11/2011   tx w/ flagyl  . COPD (chronic obstructive pulmonary disease) ([place])   . Degenerative disc disease   . Hiatal hernia 2008   on EGD above  . Irritable bowel syndrome 2004 CONSTIPATION  . Migraines   . MRSA infection    Dr. [name], currently under treatment  . PTSD (post-traumatic stress disorder)   . PUD (peptic ulcer disease) 01/2007   EGD Dr [name], 2 antral ulcers, negative h pylori  .  Rectal prolapse   . S/P colonoscopy 06/02/09   normal ([name])  . S/P endoscopy 03/26/11   gastritis-Dr [name]  . SBO (small bowel obstruction)     Patient Active Problem List   Diagnosis Date Noted  . Anxiety state 03/15/2015  . Anemia 03/14/2015  . COPD exacerbation ([place]) 03/13/2015  . Ileus ([place]) 04/18/2014  . Tobacco abuse 04/18/2014  . Hx of adenomatous colonic polyps 09/07/2013  . Nausea and vomiting 08/20/2013  . Abdominal pain 08/18/2013  . Acute bronchitis 06/09/2013  . Hypokalemia 06/09/2013  . Muscle spasms of neck 06/09/2013  . Cervical pain 03/17/2013  . Anal or rectal pain 05/15/2012  . RUQ pain 01/22/2012  . Diarrhea 11/22/2011  . C.  difficile colitis 06/25/2011  . Chronic nausea 03/13/2011  . VOMITING 10/21/2009  . ABDOMINAL PAIN 10/21/2009  . DEPRESSION 05/09/2007  . ALLERGIC RHINITIS 05/09/2007  . ASTHMA 05/09/2007  . GERD 05/09/2007  . PEPTIC ULCER DISEASE 05/09/2007  . IBS 05/09/2007  . ARTHRITIS 05/09/2007  . HIP PAIN, LEFT 05/09/2007  . NECK PAIN, CHRONIC 05/09/2007  . LOW BACK PAIN 05/09/2007  . CONSTIPATION, HX OF 05/09/2007  . HX, PERSONAL, PAST NONCOMPLIANCE 05/09/2007    Past Surgical History:  Procedure Laterality Date  . ABDOMINAL HYSTERECTOMY    . APPENDECTOMY    . BACK SURGERY    . BOWEL RESECTION     x2, secondary to adhesions  . CERVICAL SPINE SURGERY    . COLONOSCOPY  03/21/2012    [id number] ADENOMA(1) POOR PREP  . COLONOSCOPY N/A 11/17/2013   [id number] mucosa in the terminal ileum/NORMAL surgical anastomosis/Small internal hemorrhoids  . ESOPHAGOGASTRODUODENOSCOPY  03/23/2011    Normal esophagus without evidence of Barrett's, mass, erosions, or ulcerations./ Patchy erythema with occasional erosion in the antrum.  Biopsies  obtained via cold forceps to evaluate for H. pylori gastritis/ Small hiatal hernia./Normal duodenal bulb and second portion of the duodenum.  . ESOPHAGOGASTRODUODENOSCOPY  03/21/2012   [id number] Hiatal hernia/ABDOMINALPAIN/DIARRHEA MOST LIKELY DUE TO IBS, GASTRITIS DUODENITIS  . FLEXIBLE SIGMOIDOSCOPY  07/14/2002   Normal limited flexible sigmoidoscopy with stool in the rectum and rectosigmoid precluding a full colonoscopy  . LAPAROSCOPIC LYSIS INTESTINAL ADHESIONS    . NECK SURGERY  2009  . PARTIAL HYSTERECTOMY    . TUBAL LIGATION    . UMBILICAL HERNIA REPAIR  2008   x5  . UPPER GASTROINTESTINAL ENDOSCOPY  APR 2008 [facility] BLEEDING/PAIN   PUD  . UPPER GASTROINTESTINAL ENDOSCOPY  JUL 2012 [facility] PAIN   MILD GASTRITIS    OB History    Gravida Para Term Preterm AB Living   1 1 1     1    SAB TAB Ectopic Multiple Live Births                   Home Medications      Prior to Admission medications   Medication Sig Start Date End Date Taking? Authorizing Provider  albuterol (PROVENTIL,VENTOLIN) 90 MCG/ACT inhaler Inhale 2 puffs into the lungs every 6 (six) hours as needed. For shortness of breath   Yes Historical Provider, MD  ALPRAZolam Duanne Moron) 1 MG tablet Take 1-2 mg by mouth 4 (four) times daily. Patient takes 1 tablet twice a day and 2 tablets at bedtime   Yes Historical Provider, MD  amphetamine-dextroamphetamine (ADDERALL XR) 15 MG 24 hr capsule Take 15 mg by mouth 2 (two) times daily.    Yes Historical Provider, MD  dicyclomine (BENTYL) 20 MG tablet Take 20 mg  by mouth 3 (three) times daily as needed for spasms.  01/03/15  Yes Historical Provider, MD  DULoxetine (CYMBALTA) 60 MG capsule Take 120 mg by mouth every morning.    Yes Historical Provider, MD  esomeprazole (NEXIUM) 40 MG capsule Take 1 capsule (40 mg total) by mouth 2 (two) times daily before a meal. 05/17/14  Yes [name], PA-C  gabapentin (NEURONTIN) 300 MG capsule Take 300-900 mg by mouth 2 (two) times daily. 1 capsule in the AM and 3 capsules at bedtime   Yes Historical Provider, MD  mirtazapine (REMERON) 15 MG tablet Take 1 tablet by mouth at bedtime as needed (for rest).  08/17/13  Yes Historical Provider, MD    Family History Family History  Problem Relation Age of Onset  . Adopted: Yes    Social History Social History  Substance Use Topics  . Smoking status: Current Every Day Smoker    Packs/day: 0.50    Years: 10.00    Types: Cigarettes  . Smokeless tobacco: Former Systems developer  . Alcohol use No     Allergies   Penicillins; Sulfa antibiotics; Aspirin; Ibuprofen; Lactose intolerance (gi); Morphine and related; and Buprenorphine hcl   Review of Systems Review of Systems  Constitutional: Positive for fatigue. Negative for chills, diaphoresis and fever.  HENT: Positive for sore throat and trouble swallowing. Negative for congestion, drooling, rhinorrhea and tinnitus.    Eyes: Positive for visual disturbance.  Respiratory: Negative for cough, chest tightness, shortness of breath, wheezing and stridor.   Cardiovascular: Negative for chest pain.  Gastrointestinal: Positive for nausea and vomiting. Negative for abdominal pain, constipation and diarrhea.  Genitourinary: Negative for flank pain.  Musculoskeletal: Positive for neck pain. Negative for back pain and neck stiffness.  Skin: Positive for rash. Negative for wound.  Neurological: Positive for numbness and headaches. Negative for dizziness and light-headedness.  Psychiatric/Behavioral: Negative for agitation and confusion.  All other systems reviewed and are negative.    Physical Exam Updated Vital Signs BP 100/72 (BP Location: Left Arm)   Pulse 91   Temp 98.3 F (36.8 C) (Oral)   Resp 16   Ht 5\' 5"  (1.651 m)   Wt 145 lb (65.8 kg)   SpO2 95%   BMI 24.13 kg/m   Physical Exam  Constitutional: She is oriented to person, place, and time. She appears well-developed and well-nourished. No distress.  HENT:  Head: Atraumatic.    Right Ear: External ear normal.  Nose: Nose normal.  Mouth/Throat: No oropharyngeal exudate.  Eyes: Conjunctivae and EOM are normal. Pupils are equal, round, and reactive to light.  Neck: Normal range of motion. Neck supple.  Cardiovascular: Normal rate and regular rhythm.   No murmur heard. Pulmonary/Chest: Effort normal and breath sounds normal. No stridor. No respiratory distress. She has no wheezes. She has no rales. She exhibits no tenderness.  Abdominal: Soft. There is no tenderness. There is no rebound.  Musculoskeletal: She exhibits tenderness. She exhibits no edema.  Neurological: She is alert and oriented to person, place, and time. She displays normal reflexes. A cranial nerve deficit is present. She exhibits normal muscle tone. Coordination normal.  Skin: Skin is warm and dry. Capillary refill takes less than 2 seconds. She is not diaphoretic. There is  erythema.  Psychiatric: She has a normal mood and affect.  Nursing note and vitals reviewed.    ED Treatments / Results  Labs (all labs ordered are listed, but only abnormal results are displayed) Labs Reviewed  CBC WITH  DIFFERENTIAL/PLATELET - Abnormal; Notable for the following:       Result Value   Hemoglobin 11.4 (*)    All other components within normal limits  BASIC METABOLIC PANEL - Abnormal; Notable for the following:    BUN 22 (*)    Anion gap 4 (*)    All other components within normal limits  CULTURE, BLOOD (ROUTINE X 2)  CULTURE, BLOOD (ROUTINE X 2)  LACTIC ACID, PLASMA  LACTIC ACID, PLASMA  PROTIME-INR    EKG  EKG Interpretation None       Radiology Ct Soft Tissue Neck W Contrast  Result Date: 07/30/2016 CLINICAL DATA:  55 y/o F; swelling of left ear down into the left-sided neck starting today with decreased hearing and headache. EXAM: CT NECK WITH CONTRAST TECHNIQUE: Multidetector CT imaging of the neck was performed using the standard protocol following the bolus administration of intravenous contrast. CONTRAST:  75mL ISOVUE-300 IOPAMIDOL (ISOVUE-300) INJECTION 61% COMPARISON:  None. FINDINGS: Pharynx and larynx: Normal. No mass or swelling. Salivary glands: Mild enlargement enhancement of the left parotid gland. Salivary glands are otherwise unremarkable. Thyroid: Normal. Lymph nodes: Prominent left anterior upper cervical lymph nodes are probably reactive. Vascular: Negative. Limited intracranial: Negative. Visualized orbits: Negative. Mastoids and visualized paranasal sinuses: Mild diffuse paranasal sinus mucosal thickening and postsurgical changes related to bilateral partial ethmoidectomy and maxillary antrostomy. Mastoid air cells are normally aerated bilaterally. Skeleton: Anterior cervical disc at, corpectomy, and fusion from Celsius 5 through C7. Hardware appears intact. Reversal of cervical lordosis at C2-3 with minimal anterolisthesis. There is canal  stenosis at multiple levels greatest at C5-6. Is probably moderate. Additionally there are multiple levels of uncovertebral and facet hypertrophy with mild-to-moderate foraminal narrowing greatest at the C5-6 level. No acute osseous abnormality is identified. Upper chest: Moderate emphysematous changes of the lung apices bilaterally. Other: There are inflammatory changes of the left ear, surrounding subcutaneous fat, extending inferiorly to involve the parotid gland, left lateral platysma muscle, and left subcutaneous fat planes of the neck. No deep cervical inflammatory changes or discrete fluid collection is identified. No abnormal soft tissue thickening of the external auditory canal or osseous erosion. IMPRESSION: Inflammatory changes centered around the left ear and left superficial parotid gland as well as surrounding subcutaneous fat without a discrete fluid collection, osseous erosion, or extension into the deep cervical spaces. This may represent a superficial cellulitis or possibly a parotiditis with reactive surrounding inflammatory changes. Electronically Signed   By: Kristine Garbe M.D.   On: 07/30/2016 19:06    Procedures Procedures (including critical care time)  Medications Ordered in ED Medications  sodium chloride 0.9 % bolus 1,000 mL (0 mLs Intravenous Stopped 07/30/16 2043)  sodium chloride 0.9 % bolus 1,000 mL (0 mLs Intravenous Stopped 07/30/16 2043)  fentaNYL (SUBLIMAZE) injection 50 mcg (50 mcg Intravenous Given 07/30/16 1600)  iopamidol (ISOVUE-300) 61 % injection 75 mL (75 mLs Intravenous Contrast Given 07/30/16 1719)  clindamycin (CLEOCIN) IVPB 600 mg (0 mg Intravenous Stopped 07/30/16 2222)  fentaNYL (SUBLIMAZE) injection 25 mcg (25 mcg Intravenous Given 07/30/16 2043)  HYDROcodone-acetaminophen (NORCO/VICODIN) 5-325 MG per tablet 1 tablet (1 tablet Oral Given 07/30/16 2222)     Initial Impression / Assessment and Plan / ED Course  I have reviewed the triage  vital signs and the nursing notes.  Pertinent labs & imaging results that were available during my care of the patient were reviewed by me and considered in my medical decision making (see chart for details).  Clinical Course  KYMM SOCK is a 55 y.o. female with a past medical history significant for asthma, COPD, history of sinusitis, migraines, prior cervical spine surgery who presents with left cheek/neck/ear pain and swelling.  History and exam are seen above.  On exam, patient has swelling, erythema, and tenderness inferior to her left earlobe. The tenderness does include an area under her left jaw. Patient is able to open her jaw with pain. Mastoid specifically tender. Extraocular movement intact. Vision was slightly decreased compared to right on visual acuity testing. No pain with eye movement. Normal neurologic exam otherwise. No evidence of PTA on oral exam. Uvula was midline. Tongue have full range of motion and no edema seen under tongue. Patient reports left facial numbness compared to right. Are clear, abdomen nontender, no other areas of erythema or appreciated.  Based on symptoms, concern for abscess/cellulitis/parotitis. Given reported decrease in hearing, and left vision changes, and pain with mouth opening, patient will have imaging to look for extension of infection. We given fluids, pain medicine, and lab work will be drawn.   Laboratory testing was grossly unremarkable with a normal lactic acid, normal white blood cell count, and no significant electrolyte abnormality. CT of the neck showed evidence of cellulitis and possible parotitis. No evidence of abscess or drainable fluid collection. No evidence of mastoiditis. No acute osseous abnormality seen in hardware. No deep neck infection seen.  Due to reassuring testing and imaging results, feel patient has cellulitis of left face/parotitis. Patient will be given a dose of IV antibiotics in the ED. Patient will be given  more pain medicine. Patient will be discharged with plans to follow-up with a PCP as well as antibiotics and pain medicine. Patient will be given strict return precautions for any worsening symptoms including that of respiratory troubles.    Final Clinical Impressions(s) / ED Diagnoses   Final diagnoses:  Parotitis, acute  Cellulitis of face    New Prescriptions Discharge Medication List as of 07/30/2016 10:12 PM    START taking these medications   Details  clindamycin (CLEOCIN) 150 MG capsule Take 1 capsule (150 mg total) by mouth 4 (four) times daily., Starting Mon 07/30/2016, Until Thu 08/09/2016, Print    HYDROcodone-acetaminophen (NORCO/VICODIN) 5-325 MG tablet Take 1 tablet by mouth every 6 (six) hours as needed., Starting Mon 07/30/2016, Print        Clinical Impression: 1. Parotitis, acute   2. Cellulitis of face     Disposition: Discharge  Condition: Good  I have discussed the results, Dx and Tx plan with the pt(& family if present). He/she/they expressed understanding and agree(s) with the plan. Discharge instructions discussed at great length. Strict return precautions discussed and pt &/or family have verbalized understanding of the instructions. No further questions at time of discharge.    Discharge Medication List as of 07/30/2016 10:12 PM    START taking these medications   Details  clindamycin (CLEOCIN) 150 MG capsule Take 1 capsule (150 mg total) by mouth 4 (four) times daily., Starting Mon 07/30/2016, Until Thu 08/09/2016, Print    HYDROcodone-acetaminophen (NORCO/VICODIN) 5-325 MG tablet Take 1 tablet by mouth every 6 (six) hours as needed., Starting Mon 07/30/2016, Print        Follow Up: Elwyn Reach, MD [place] [address] [place] [address] [id number] mc [place] [phone]  If symptoms worsen     Courtney Paris, MD 07/31/16  [phone]

## 2016-08-04 ENCOUNTER — Encounter (HOSPITAL_COMMUNITY): Payer: Self-pay | Admitting: Emergency Medicine

## 2016-08-04 ENCOUNTER — Emergency Department (HOSPITAL_COMMUNITY)
Admission: EM | Admit: 2016-08-04 | Discharge: 2016-08-04 | Disposition: A | Discharge status: Home / Self Care | Payer: Medicare Other | Attending: Emergency Medicine | Admitting: Emergency Medicine

## 2016-08-04 DIAGNOSIS — J441 Chronic obstructive pulmonary disease with (acute) exacerbation: Secondary | ICD-10-CM | POA: Insufficient documentation

## 2016-08-04 DIAGNOSIS — L0201 Cutaneous abscess of face: Secondary | ICD-10-CM | POA: Diagnosis not present

## 2016-08-04 DIAGNOSIS — F909 Attention-deficit hyperactivity disorder, unspecified type: Secondary | ICD-10-CM | POA: Insufficient documentation

## 2016-08-04 DIAGNOSIS — Z79899 Other long term (current) drug therapy: Secondary | ICD-10-CM | POA: Diagnosis not present

## 2016-08-04 DIAGNOSIS — Z792 Long term (current) use of antibiotics: Secondary | ICD-10-CM | POA: Insufficient documentation

## 2016-08-04 DIAGNOSIS — J45909 Unspecified asthma, uncomplicated: Secondary | ICD-10-CM | POA: Diagnosis not present

## 2016-08-04 DIAGNOSIS — F17210 Nicotine dependence, cigarettes, uncomplicated: Secondary | ICD-10-CM | POA: Diagnosis not present

## 2016-08-04 LAB — CULTURE, BLOOD (ROUTINE X 2)
CULTURE: NO GROWTH
CULTURE: NO GROWTH

## 2016-08-04 LAB — CBC WITH DIFFERENTIAL/PLATELET
BASOS PCT: 1 %
Basophils Absolute: 0.1 10*3/uL (ref 0.0–0.1)
EOS ABS: 0.2 10*3/uL (ref 0.0–0.7)
EOS PCT: 3 %
HCT: 37.3 % (ref 36.0–46.0)
HEMOGLOBIN: 11.7 g/dL — AB (ref 12.0–15.0)
Lymphocytes Relative: 24 %
Lymphs Abs: 2 10*3/uL (ref 0.7–4.0)
MCH: 28.1 pg (ref 26.0–34.0)
MCHC: 31.4 g/dL (ref 30.0–36.0)
MCV: 89.7 fL (ref 78.0–100.0)
MONOS PCT: 6 %
Monocytes Absolute: 0.6 10*3/uL (ref 0.1–1.0)
NEUTROS PCT: 66 %
Neutro Abs: 5.7 10*3/uL (ref 1.7–7.7)
PLATELETS: 372 10*3/uL (ref 150–400)
RBC: 4.16 MIL/uL (ref 3.87–5.11)
RDW: 14.7 % (ref 11.5–15.5)
WBC: 8.6 10*3/uL (ref 4.0–10.5)

## 2016-08-04 LAB — I-STAT CG4 LACTIC ACID, ED: LACTIC ACID, VENOUS: 1.13 mmol/L (ref 0.5–1.9)

## 2016-08-04 MED ORDER — ONDANSETRON HCL 4 MG/2ML IJ SOLN
4.0000 mg | Freq: Once | INTRAMUSCULAR | Status: AC
  Administered 2016-08-04: 4 mg via INTRAVENOUS
  Filled 2016-08-04: qty 2

## 2016-08-04 MED ORDER — SODIUM CHLORIDE 0.9 % IV BOLUS (SEPSIS)
500.0000 mL | Freq: Once | INTRAVENOUS | Status: AC
  Administered 2016-08-04: 500 mL via INTRAVENOUS

## 2016-08-04 MED ORDER — HYDROCODONE-ACETAMINOPHEN 5-325 MG PO TABS: 1.0000 | ORAL_TABLET | Freq: Four times a day (QID) | ORAL | 0 refills | Status: DC | PRN

## 2016-08-04 MED ORDER — HYDROMORPHONE HCL 1 MG/ML IJ SOLN
1.0000 mg | Freq: Once | INTRAMUSCULAR | Status: AC
  Administered 2016-08-04: 1 mg via INTRAVENOUS
  Filled 2016-08-04: qty 1

## 2016-08-04 MED ORDER — SODIUM CHLORIDE 0.9 % IV SOLN: INTRAVENOUS | Status: DC

## 2016-08-04 MED ORDER — DOXYCYCLINE HYCLATE 100 MG PO CAPS: 100.0000 mg | ORAL_CAPSULE | Freq: Two times a day (BID) | ORAL | 0 refills | Status: DC

## 2016-08-04 MED ORDER — VANCOMYCIN HCL IN DEXTROSE 1-5 GM/200ML-% IV SOLN
1000.0000 mg | Freq: Once | INTRAVENOUS | Status: AC
  Administered 2016-08-04: 1000 mg via INTRAVENOUS
  Filled 2016-08-04: qty 200

## 2016-08-04 NOTE — ED Triage Notes (Signed)
PT c/o continued pain and drinage to left ear with sore throat and swelling to left side of neck x1 week ago. PT states she is still taking cleocin that prescribed in the ED x5 days ago. PT also states new onset of nausea.

## 2016-08-04 NOTE — Discharge Instructions (Signed)
Continue warm compresses to the left ear area where the abscesses. Continue drainage is good. Continue your current antibiotic and out on the doxycycline antibiotic as well. Take pain medicine as directed. Make an appointment to follow up with ear nose and throat.

## 2016-08-04 NOTE — ED Provider Notes (Signed)
[facility] DEPT Provider Note   CSN: [id number] Arrival date & time: 08/04/16  1425     History   Chief Complaint Chief Complaint  Patient presents with  . Abscess    HPI [name] is a 55 y.o. female.  Patient seen October 23 for same complaint. Patient had CT soft tissue neck with contrast done at that time without any evidence of any abscess cavity. Clinical impression at that time was perhaps a parotid gland infection or a cellulitis to the face. Since that time patient has been taking clindamycin. And the abscess formed at the tragus of the left TM. And has been draining. Patient states pain is increased. Denies fevers. Denies any trouble swallowing. No nausea or vomiting.      Past Medical History:  Diagnosis Date  . ADHD (attention deficit hyperactivity disorder)   . Anginal pain ([place])   . Asthma   . BMI (body mass index) 20.0-29.9 2009 127 LBS  . Chronic abdominal pain 2003   EX LAP APR 2004 RUPTURE L OV CYST, JUL 2004 ADHESIONS  . Chronic nausea   . Clostridium difficile colitis 04/10/11 &11/2011   tx w/ flagyl  . COPD (chronic obstructive pulmonary disease) ([place])   . Degenerative disc disease   . Hiatal hernia 2008   on EGD above  . Irritable bowel syndrome 2004 CONSTIPATION  . Migraines   . MRSA infection    Dr. [name], currently under treatment  . PTSD (post-traumatic stress disorder)   . PUD (peptic ulcer disease) 01/2007   EGD Dr [name], 2 antral ulcers, negative h pylori  . Rectal prolapse   . S/P colonoscopy 06/02/09   normal ([name])  . S/P endoscopy 03/26/11   gastritis-Dr [name]  . SBO (small bowel obstruction)     Patient Active Problem List   Diagnosis Date Noted  . Anxiety state 03/15/2015  . Anemia 03/14/2015  . COPD exacerbation ([place]) 03/13/2015  . Ileus ([place]) 04/18/2014  . Tobacco abuse 04/18/2014  . Hx of adenomatous colonic polyps 09/07/2013  . Nausea and vomiting 08/20/2013  . Abdominal pain 08/18/2013  .  Acute bronchitis 06/09/2013  . Hypokalemia 06/09/2013  . Muscle spasms of neck 06/09/2013  . Cervical pain 03/17/2013  . Anal or rectal pain 05/15/2012  . RUQ pain 01/22/2012  . Diarrhea 11/22/2011  . C. difficile colitis 06/25/2011  . Chronic nausea 03/13/2011  . VOMITING 10/21/2009  . ABDOMINAL PAIN 10/21/2009  . DEPRESSION 05/09/2007  . ALLERGIC RHINITIS 05/09/2007  . ASTHMA 05/09/2007  . GERD 05/09/2007  . PEPTIC ULCER DISEASE 05/09/2007  . IBS 05/09/2007  . ARTHRITIS 05/09/2007  . HIP PAIN, LEFT 05/09/2007  . NECK PAIN, CHRONIC 05/09/2007  . LOW BACK PAIN 05/09/2007  . CONSTIPATION, HX OF 05/09/2007  . HX, PERSONAL, PAST NONCOMPLIANCE 05/09/2007    Past Surgical History:  Procedure Laterality Date  . ABDOMINAL HYSTERECTOMY    . APPENDECTOMY    . BACK SURGERY    . BOWEL RESECTION     x2, secondary to adhesions  . CERVICAL SPINE SURGERY    . COLONOSCOPY  03/21/2012    [id number] ADENOMA(1) POOR PREP  . COLONOSCOPY N/A 11/17/2013   [id number] mucosa in the terminal ileum/NORMAL surgical anastomosis/Small internal hemorrhoids  . ESOPHAGOGASTRODUODENOSCOPY  03/23/2011    Normal esophagus without evidence of Barrett's, mass, erosions, or ulcerations./ Patchy erythema with occasional erosion in the antrum.  Biopsies  obtained via cold forceps to evaluate for H. pylori gastritis/ Small hiatal hernia./Normal duodenal  bulb and second portion of the duodenum.  . ESOPHAGOGASTRODUODENOSCOPY  03/21/2012   [id number] Hiatal hernia/ABDOMINALPAIN/DIARRHEA MOST LIKELY DUE TO IBS, GASTRITIS DUODENITIS  . FLEXIBLE SIGMOIDOSCOPY  07/14/2002   Normal limited flexible sigmoidoscopy with stool in the rectum and rectosigmoid precluding a full colonoscopy  . LAPAROSCOPIC LYSIS INTESTINAL ADHESIONS    . NECK SURGERY  2009  . PARTIAL HYSTERECTOMY    . TUBAL LIGATION    . UMBILICAL HERNIA REPAIR  2008   x5  . UPPER GASTROINTESTINAL ENDOSCOPY  APR 2008 [facility] BLEEDING/PAIN   PUD  . UPPER  GASTROINTESTINAL ENDOSCOPY  JUL 2012 [facility] PAIN   MILD GASTRITIS    OB History    Gravida Para Term Preterm AB Living   1 1 1     1    SAB TAB Ectopic Multiple Live Births                   Home Medications    Prior to Admission medications   Medication Sig Start Date End Date Taking? Authorizing Provider  ALPRAZolam Duanne Moron) 1 MG tablet Take 1-2 mg by mouth 4 (four) times daily. Patient takes 1 tablet twice a day and 2 tablets at bedtime   Yes Historical Provider, MD  amphetamine-dextroamphetamine (ADDERALL XR) 15 MG 24 hr capsule Take 15 mg by mouth 2 (two) times daily.    Yes Historical Provider, MD  clindamycin (CLEOCIN) 150 MG capsule Take 1 capsule (150 mg total) by mouth 4 (four) times daily. 07/30/16 08/09/16 Yes Gwenyth Allegra Tegeler, MD  DULoxetine (CYMBALTA) 60 MG capsule Take 120 mg by mouth every morning.    Yes Historical Provider, MD  esomeprazole (NEXIUM) 40 MG capsule Take 1 capsule (40 mg total) by mouth 2 (two) times daily before a meal. 05/17/14  Yes [name], PA-C  gabapentin (NEURONTIN) 300 MG capsule Take 300-900 mg by mouth 2 (two) times daily. 1 capsule in the AM and 3 capsules at bedtime   Yes Historical Provider, MD  HYDROcodone-acetaminophen (NORCO/VICODIN) 5-325 MG tablet Take 1 tablet by mouth every 6 (six) hours as needed. Patient taking differently: Take 1 tablet by mouth every 6 (six) hours as needed for moderate pain.  07/30/16  Yes Gwenyth Allegra Tegeler, MD  albuterol (PROVENTIL,VENTOLIN) 90 MCG/ACT inhaler Inhale 2 puffs into the lungs every 6 (six) hours as needed. For shortness of breath    Historical Provider, MD  doxycycline (VIBRAMYCIN) 100 MG capsule Take 1 capsule (100 mg total) by mouth 2 (two) times daily. 08/04/16   Fredia Sorrow, MD  HYDROcodone-acetaminophen (NORCO/VICODIN) 5-325 MG tablet Take 1-2 tablets by mouth every 6 (six) hours as needed. 08/04/16   Fredia Sorrow, MD  mirtazapine (REMERON) 15 MG tablet Take 1 tablet by mouth at  bedtime as needed (for rest).  08/17/13   Historical Provider, MD    Family History Family History  Problem Relation Age of Onset  . Adopted: Yes    Social History Social History  Substance Use Topics  . Smoking status: Current Every Day Smoker    Packs/day: 0.50    Years: 10.00    Types: Cigarettes  . Smokeless tobacco: Former Systems developer  . Alcohol use No     Allergies   Penicillins; Sulfa antibiotics; Clarithromycin; Moxifloxacin; Quinolones; Salicylates; Aspirin; Ibuprofen; Lactose intolerance (gi); Lactulose; Morphine and related; and Buprenorphine hcl   Review of Systems Review of Systems  Constitutional: Negative for fever.  HENT: Positive for ear pain.   Eyes: Negative for visual disturbance.  Respiratory: Negative for shortness of breath.   Cardiovascular: Negative for chest pain.  Gastrointestinal: Negative for abdominal pain and nausea.  Genitourinary: Negative for dysuria.  Musculoskeletal: Negative for neck stiffness.  Skin: Positive for wound.  Neurological: Positive for headaches.  Hematological: Does not bruise/bleed easily.  Psychiatric/Behavioral: Negative for confusion.     Physical Exam Updated Vital Signs BP 96/74 (BP Location: Left Arm)   Pulse 81   Temp 98.1 F (36.7 C) (Oral)   Resp 18   Ht 5\' 4"  (1.626 m)   Wt 65.8 kg   SpO2 93%   BMI 24.89 kg/m   Physical Exam  Constitutional: She is oriented to person, place, and time. She appears well-developed and well-nourished. No distress.  HENT:  Head: Normocephalic.  Right Ear: External ear normal.  Left Ear: External ear normal.  Mouth/Throat: Oropharynx is clear and moist. No oropharyngeal exudate.  Left ear area at the tragus with purulent discharge. Erythema about 4 cm around that area. Ear canal normal tympanic membrane normal. Some cervical adenopathy no palpable abscess cavities.  Eyes: Conjunctivae and EOM are normal. Pupils are equal, round, and reactive to light.  Neck: Normal range  of motion. Neck supple.  Cardiovascular: Normal rate, regular rhythm and normal heart sounds.   Pulmonary/Chest: Effort normal and breath sounds normal. No respiratory distress.  Abdominal: Soft. Bowel sounds are normal. There is no tenderness.  Musculoskeletal: Normal range of motion.  Neurological: She is alert and oriented to person, place, and time. No cranial nerve deficit. Coordination normal.  Skin: Skin is warm. There is erythema.  Nursing note and vitals reviewed.    ED Treatments / Results  Labs (all labs ordered are listed, but only abnormal results are displayed) Labs Reviewed  CBC WITH DIFFERENTIAL/PLATELET - Abnormal; Notable for the following:       Result Value   Hemoglobin 11.7 (*)    All other components within normal limits  I-STAT CG4 LACTIC ACID, ED    EKG  EKG Interpretation None       Radiology No results found.  Procedures Procedures (including critical care time)  Medications Ordered in ED Medications  0.9 %  sodium chloride infusion (not administered)  HYDROmorphone (DILAUDID) injection 1 mg (not administered)  ondansetron (ZOFRAN) injection 4 mg (4 mg Intravenous Given 08/04/16 1612)  sodium chloride 0.9 % bolus 500 mL (0 mLs Intravenous Stopped 08/04/16 1708)  vancomycin (VANCOCIN) IVPB 1000 mg/200 mL premix (0 mg Intravenous Stopped 08/04/16 1708)     Initial Impression / Assessment and Plan / ED Course  I have reviewed the triage vital signs and the nursing notes.  Pertinent labs & imaging results that were available during my care of the patient were reviewed by me and considered in my medical decision making (see chart for details).  Clinical Course   The patient was spontaneous drainage of which is probably a skin abscess at the tragus of the left ear. Ear canal normal tympanic membrane normal. Patient already on clindamycin. CT soft tissue neck with contrast done on October 23 without any deep space findings. No abscess cavity at  that time. Patient will receive vancomycin here recheck of labs white count is actually improved. No fever patient nontoxic no acute distress. Pain treated. Patient will continue her clindamycin and also will add on doxycycline as I think this is more of a skin abscess.    Final Clinical Impressions(s) / ED Diagnoses   Final diagnoses:  Abscess of face  New Prescriptions New Prescriptions   DOXYCYCLINE (VIBRAMYCIN) 100 MG CAPSULE    Take 1 capsule (100 mg total) by mouth 2 (two) times daily.   HYDROCODONE-ACETAMINOPHEN (NORCO/VICODIN) 5-325 MG TABLET    Take 1-2 tablets by mouth every 6 (six) hours as needed.     Fredia Sorrow, MD 08/04/16 1736

## 2016-10-16 ENCOUNTER — Inpatient Hospital Stay (EMERGENCY_DEPARTMENT_HOSPITAL)
Admission: AD | Admit: 2016-10-16 | Discharge: 2016-10-16 | Disposition: A | Discharge status: Home / Self Care | Payer: Medicare Other | Source: Ambulatory Visit | Attending: Obstetrics & Gynecology | Admitting: Obstetrics & Gynecology

## 2016-10-16 ENCOUNTER — Encounter (HOSPITAL_COMMUNITY): Payer: Self-pay

## 2016-10-16 DIAGNOSIS — N764 Abscess of vulva: Secondary | ICD-10-CM

## 2016-10-16 LAB — URINALYSIS, ROUTINE W REFLEX MICROSCOPIC
BILIRUBIN URINE: NEGATIVE
GLUCOSE, UA: NEGATIVE mg/dL
HGB URINE DIPSTICK: NEGATIVE
Ketones, ur: 5 mg/dL — AB
NITRITE: NEGATIVE
Protein, ur: NEGATIVE mg/dL
SPECIFIC GRAVITY, URINE: 1.025 (ref 1.005–1.030)
pH: 6 (ref 5.0–8.0)

## 2016-10-16 LAB — WET PREP, GENITAL
Clue Cells Wet Prep HPF POC: NONE SEEN
SPERM: NONE SEEN
Trich, Wet Prep: NONE SEEN
Yeast Wet Prep HPF POC: NONE SEEN

## 2016-10-16 MED ORDER — IBUPROFEN 600 MG PO TABS: 600.0000 mg | ORAL_TABLET | Freq: Four times a day (QID) | ORAL | 0 refills | Status: DC | PRN

## 2016-10-16 MED ORDER — HYDROCODONE-ACETAMINOPHEN 5-325 MG PO TABS: 1.0000 | ORAL_TABLET | Freq: Four times a day (QID) | ORAL | 0 refills | Status: DC | PRN

## 2016-10-16 MED ORDER — SULFAMETHOXAZOLE-TRIMETHOPRIM 800-160 MG PO TABS: 1.0000 | ORAL_TABLET | Freq: Two times a day (BID) | ORAL | 0 refills | Status: DC

## 2016-10-16 MED ORDER — HYDROCODONE-ACETAMINOPHEN 5-325 MG PO TABS
2.0000 | ORAL_TABLET | Freq: Once | ORAL | Status: AC
  Administered 2016-10-16: 2 via ORAL
  Filled 2016-10-16: qty 2

## 2016-10-16 NOTE — MAU Provider Note (Signed)
History     CSN: [id number]  Arrival date and time: 10/16/16 1408   None     Chief Complaint  Patient presents with  . Bartholin's Cyst   Non-pregnant female here with left labial pain and swelling. She reports sx started yesterday. Pain started to radiate into left groin today. She has not used analgesics. She has also notified some yellow drainage from the area today. She denies shaving the area. No new soaps or detergents. She has not been sexually active in 16 years. She denies vaginal discharge. She has not used any analgesics.   Pertinent Gynecological History: Menses: none Sexually transmitted diseases: no past history  Past Medical History:  Diagnosis Date  . ADHD (attention deficit hyperactivity disorder)   . Anginal pain ([place])   . Asthma   . BMI (body mass index) 20.0-29.9 2009 127 LBS  . Chronic abdominal pain 2003   EX LAP APR 2004 RUPTURE L OV CYST, JUL 2004 ADHESIONS  . Chronic nausea   . Clostridium difficile colitis 04/10/11 &11/2011   tx w/ flagyl  . COPD (chronic obstructive pulmonary disease) ([place])   . Degenerative disc disease   . Hiatal hernia 2008   on EGD above  . Irritable bowel syndrome 2004 CONSTIPATION  . Migraines   . MRSA infection    Dr. [name], currently under treatment  . PTSD (post-traumatic stress disorder)   . PUD (peptic ulcer disease) 01/2007   EGD Dr [name], 2 antral ulcers, negative h pylori  . Rectal prolapse   . S/P colonoscopy 06/02/09   normal ([name])  . S/P endoscopy 03/26/11   gastritis-Dr [name]  . SBO (small bowel obstruction)     Past Surgical History:  Procedure Laterality Date  . ABDOMINAL HYSTERECTOMY    . APPENDECTOMY    . BACK SURGERY    . BOWEL RESECTION     x2, secondary to adhesions  . CERVICAL SPINE SURGERY    . COLONOSCOPY  03/21/2012    [id number] ADENOMA(1) POOR PREP  . COLONOSCOPY N/A 11/17/2013   [id number] mucosa in the terminal ileum/NORMAL surgical anastomosis/Small internal  hemorrhoids  . ESOPHAGOGASTRODUODENOSCOPY  03/23/2011    Normal esophagus without evidence of Barrett's, mass, erosions, or ulcerations./ Patchy erythema with occasional erosion in the antrum.  Biopsies  obtained via cold forceps to evaluate for H. pylori gastritis/ Small hiatal hernia./Normal duodenal bulb and second portion of the duodenum.  . ESOPHAGOGASTRODUODENOSCOPY  03/21/2012   [id number] Hiatal hernia/ABDOMINALPAIN/DIARRHEA MOST LIKELY DUE TO IBS, GASTRITIS DUODENITIS  . FLEXIBLE SIGMOIDOSCOPY  07/14/2002   Normal limited flexible sigmoidoscopy with stool in the rectum and rectosigmoid precluding a full colonoscopy  . LAPAROSCOPIC LYSIS INTESTINAL ADHESIONS    . NECK SURGERY  2009  . PARTIAL HYSTERECTOMY    . TUBAL LIGATION    . UMBILICAL HERNIA REPAIR  2008   x5  . UPPER GASTROINTESTINAL ENDOSCOPY  APR 2008 [facility] BLEEDING/PAIN   PUD  . UPPER GASTROINTESTINAL ENDOSCOPY  JUL 2012 [facility] PAIN   MILD GASTRITIS    Family History  Problem Relation Age of Onset  . Adopted: Yes    Social History  Substance Use Topics  . Smoking status: Current Every Day Smoker    Packs/day: 0.50    Years: 10.00    Types: Cigarettes  . Smokeless tobacco: Former Systems developer  . Alcohol use No    Allergies:  Allergies  Allergen Reactions  . Penicillins Anaphylaxis    Has patient had a PCN reaction causing immediate rash,  facial/tongue/throat swelling, SOB or lightheadedness with hypotension: Yes Has patient had a PCN reaction causing severe rash involving mucus membranes or skin necrosis: No Has patient had a PCN reaction that required hospitalization No Has patient had a PCN reaction occurring within the last 10 years: No If all of the above answers are "NO", then may proceed with Cephalosporin use.   . Sulfa Antibiotics Itching  . Clarithromycin Nausea And Vomiting  . Moxifloxacin Nausea And Vomiting  . Quinolones Nausea And Vomiting  . Salicylates Nausea And Vomiting  . Aspirin Other (See  Comments)    Burning of stomach. REACTION: GI upset  . Ibuprofen Nausea Only    Upset stomach due to acid reflux  . Lactose Intolerance (Gi) Diarrhea  . Lactulose Diarrhea  . Morphine And Related Itching  . Buprenorphine Hcl Itching    Prescriptions Prior to Admission  Medication Sig Dispense Refill Last Dose  . albuterol (PROVENTIL) (2.5 MG/3ML) 0.083% nebulizer solution Take 2.5 mg by nebulization every 6 (six) hours as needed for wheezing or shortness of breath.   10/16/2016 at Unknown time  . albuterol (PROVENTIL,VENTOLIN) 90 MCG/ACT inhaler Inhale 2 puffs into the lungs every 6 (six) hours as needed. For shortness of breath   10/16/2016 at Unknown time  . ALPRAZolam (XANAX) 1 MG tablet Take 1 mg by mouth 4 (four) times daily. Patient takes 1 tablet twice a day and 2 tablets at bedtime   10/16/2016 at Unknown time  . amphetamine-dextroamphetamine (ADDERALL XR) 15 MG 24 hr capsule Take 15 mg by mouth every morning.    Past Month at Unknown time  . calcium carbonate (TUMS - DOSED IN MG ELEMENTAL CALCIUM) 500 MG chewable tablet Chew 2 tablets by mouth 2 (two) times daily as needed for indigestion or heartburn.   Past Week at Unknown time  . cyclobenzaprine (FLEXERIL) 10 MG tablet Take by mouth 2 (two) times daily as needed.   10/16/2016 at Unknown time  . diphenhydrAMINE (BENADRYL) 25 MG tablet Take 25 mg by mouth at bedtime as needed for allergies.   Past Week at Unknown time  . DULoxetine (CYMBALTA) 60 MG capsule Take 120 mg by mouth every morning.    10/16/2016 at Unknown time  . esomeprazole (NEXIUM) 40 MG capsule Take 1 capsule (40 mg total) by mouth 2 (two) times daily before a meal. 60 capsule 3 10/16/2016 at Unknown time  . gabapentin (NEURONTIN) 300 MG capsule Take 300-600 mg by mouth 2 (two) times daily. 1 capsule in the AM and 2 capsules at bedtime   10/16/2016 at Unknown time  . HYDROcodone-acetaminophen (NORCO/VICODIN) 5-325 MG tablet Take 1-2 tablets by mouth every 6 (six) hours as needed. 14  tablet 0 10/16/2016 at Unknown time  . mirtazapine (REMERON) 15 MG tablet Take 1 tablet by mouth at bedtime as needed (for rest).    10/15/2016 at Unknown time  . QUEtiapine (SEROQUEL) 50 MG tablet Take 150 mg by mouth at bedtime.    10/15/2016 at Unknown time  . triamcinolone cream (KENALOG) 0.1 % Apply 1 application topically 3 (three) times daily.   10/16/2016 at Unknown time  . Vitamin D, Ergocalciferol, (DRISDOL) 50000 units CAPS capsule Take 50,000 Units by mouth every 7 (seven) days. Takes on Thursday.   Past Week at Unknown time  . doxycycline (VIBRAMYCIN) 100 MG capsule Take 1 capsule (100 mg total) by mouth 2 (two) times daily. (Patient not taking: Reported on 10/16/2016) 14 capsule 0 Completed Course at Unknown time  . HYDROcodone-acetaminophen (  NORCO/VICODIN) 5-325 MG tablet Take 1 tablet by mouth every 6 (six) hours as needed. (Patient not taking: Reported on 10/16/2016) 15 tablet 0 Not Taking at Unknown time    Review of Systems  Genitourinary: Positive for genital sores.   Physical Exam   Blood pressure 111/79, pulse 108, temperature 97.8 F (36.6 C), resp. rate 18, height 5\' 4"  (1.626 m), weight 74.4 kg (164 lb).  Physical Exam  Constitutional: She appears well-developed and well-nourished. No distress.  HENT:  Head: Normocephalic.  Neck: Normal range of motion.  Cardiovascular: Normal rate.   Respiratory: Effort normal.  Genitourinary:    There is tenderness and lesion on the left labia.  Genitourinary Comments: Left labial majora edematous and mildly erythematous and fluctuant. 75mm lesion to left labial majora with yellow ulcerated appearance  Musculoskeletal: Normal range of motion.  Neurological: She is alert.  Skin: Skin is warm and dry.  Psychiatric: She has a normal mood and affect.    Results for orders placed or performed during the hospital encounter of 10/16/16 (from the past 72 hour(s))  GC/Chlamydia probe amp ([place])not at [facility]     Status: None    Collection Time: 10/16/16 12:00 AM  Result Value Ref Range   Chlamydia Negative     Comment: Normal Reference Range - Negative   Neisseria gonorrhea Negative     Comment: Normal Reference Range - Negative  Urinalysis, Routine w reflex microscopic     Status: Abnormal   Collection Time: 10/16/16  2:44 PM  Result Value Ref Range   Color, Urine YELLOW YELLOW   APPearance CLEAR CLEAR   Specific Gravity, Urine 1.025 1.005 - 1.030   pH 6.0 5.0 - 8.0   Glucose, UA NEGATIVE NEGATIVE mg/dL   Hgb urine dipstick NEGATIVE NEGATIVE   Bilirubin Urine NEGATIVE NEGATIVE   Ketones, ur 5 (A) NEGATIVE mg/dL   Protein, ur NEGATIVE NEGATIVE mg/dL   Nitrite NEGATIVE NEGATIVE   Leukocytes, UA TRACE (A) NEGATIVE   RBC / HPF 6-30 0 - 5 RBC/hpf   WBC, UA 0-5 0 - 5 WBC/hpf   Bacteria, UA RARE (A) NONE SEEN   Squamous Epithelial / LPF 0-5 (A) NONE SEEN   Mucous PRESENT    Hyaline Casts, UA PRESENT   Wet prep, genital     Status: Abnormal   Collection Time: 10/16/16  4:29 PM  Result Value Ref Range   Yeast Wet Prep HPF POC NONE SEEN NONE SEEN   Trich, Wet Prep NONE SEEN NONE SEEN   Clue Cells Wet Prep HPF POC NONE SEEN NONE SEEN   WBC, Wet Prep HPF POC MODERATE (A) NONE SEEN    Comment: MODERATE BACTERIA SEEN   Sperm NONE SEEN     MAU Course  Procedures Vicodin 2 tab po x1  MDM Labs ordered and reviewed. Recommend I&D for abscess..   Consent form signed. Time out.   Patient positioned and draped with sterile towels.  Preoperative medication: Vicodin 2 tablet po    Area cleaned with betadine Local infiltrate with lidocaine 1%. Amount 2 ccs  I&D Scalpel size: #11blade Incision type: Straight single Complexity: Complex Drained NO drainage Patient tolerance: Tolerated procedure well.    No output of drainage from procedure. Will start abx (pt reports able to tolerate Bactrim). BID sitz baths. No evidence of pelvic infection. Stable for discharge home.   Assessment and Plan   1.  Labial abscess    Discharge home Sitz bath BID Follow up in [facility]  if sx worsen or don't resolve with abx Return to MAU for worsening pain or fever Rx Bactrim Rx Ibuprofen  Allergies as of 10/16/2016      Reactions   Penicillins Anaphylaxis   Has patient had a PCN reaction causing immediate rash, facial/tongue/throat swelling, SOB or lightheadedness with hypotension: Yes Has patient had a PCN reaction causing severe rash involving mucus membranes or skin necrosis: No Has patient had a PCN reaction that required hospitalization No Has patient had a PCN reaction occurring within the last 10 years: No If all of the above answers are "NO", then may proceed with Cephalosporin use.   Sulfa Antibiotics Itching   Clarithromycin Nausea And Vomiting   Moxifloxacin Nausea And Vomiting   Quinolones Nausea And Vomiting   Salicylates Nausea And Vomiting   Aspirin Other (See Comments)   Burning of stomach. REACTION: GI upset   Ibuprofen Nausea Only   Upset stomach due to acid reflux   Lactose Intolerance (gi) Diarrhea   Lactulose Diarrhea   Morphine And Related Itching   Buprenorphine Hcl Itching      Medication List    STOP taking these medications   doxycycline 100 MG capsule Commonly known as:  VIBRAMYCIN     TAKE these medications   albuterol (2.5 MG/3ML) 0.083% nebulizer solution Commonly known as:  PROVENTIL Take 2.5 mg by nebulization every 6 (six) hours as needed for wheezing or shortness of breath.   albuterol 90 MCG/ACT inhaler Commonly known as:  PROVENTIL,VENTOLIN Inhale 2 puffs into the lungs every 6 (six) hours as needed for wheezing or shortness of breath. For shortness of breath   ALPRAZolam 1 MG tablet Commonly known as:  XANAX Take 1 mg by mouth 4 (four) times daily. Patient takes 1 tablet twice a day and 2 tablets at bedtime   amphetamine-dextroamphetamine 15 MG 24 hr capsule Commonly known as:  ADDERALL XR Take 15 mg by mouth every morning.   calcium carbonate  500 MG chewable tablet Commonly known as:  TUMS - dosed in mg elemental calcium Chew 2 tablets by mouth 2 (two) times daily as needed for indigestion or heartburn.   cyclobenzaprine 10 MG tablet Commonly known as:  FLEXERIL Take 10 mg by mouth 2 (two) times daily as needed for muscle spasms.   diphenhydrAMINE 25 MG tablet Commonly known as:  BENADRYL Take 25 mg by mouth at bedtime as needed for allergies.   DULoxetine 60 MG capsule Commonly known as:  CYMBALTA Take 120 mg by mouth every morning.   esomeprazole 40 MG capsule Commonly known as:  NEXIUM Take 1 capsule (40 mg total) by mouth 2 (two) times daily before a meal.   gabapentin 300 MG capsule Commonly known as:  NEURONTIN Take 300-600 mg by mouth 2 (two) times daily. 1 capsule in the AM and 2 capsules at bedtime   HYDROcodone-acetaminophen 5-325 MG tablet Commonly known as:  NORCO Take 1 tablet by mouth every 6 (six) hours as needed for moderate pain. What changed:  reasons to take this   mirtazapine 15 MG tablet Commonly known as:  REMERON Take 1 tablet by mouth at bedtime as needed (for rest).   QUEtiapine 50 MG tablet Commonly known as:  SEROQUEL Take 150 mg by mouth at bedtime.   sulfamethoxazole-trimethoprim 800-160 MG tablet Commonly known as:  BACTRIM DS,SEPTRA DS Take 1 tablet by mouth 2 (two) times daily.   triamcinolone cream 0.1 % Commonly known as:  KENALOG Apply 1 application topically 3 (three) times  daily.   Vitamin D (Ergocalciferol) 50000 units Caps capsule Commonly known as:  DRISDOL Take 50,000 Units by mouth every 7 (seven) days. Takes on Thursday.      Julianne Handler, CNM 10/16/2016, 4:37 PM

## 2016-10-16 NOTE — MAU Note (Signed)
Pt presents to MAU with complaints of lower abdominal pain with a bartholin's cyst. Denies any abnormal discharge, reports vaginal spotting

## 2016-10-17 ENCOUNTER — Ambulatory Visit (INDEPENDENT_AMBULATORY_CARE_PROVIDER_SITE_OTHER): Payer: Medicare Other | Admitting: Family Medicine

## 2016-10-17 ENCOUNTER — Encounter: Payer: Self-pay | Admitting: Family Medicine

## 2016-10-17 ENCOUNTER — Inpatient Hospital Stay (HOSPITAL_COMMUNITY)
Admission: AD | Admit: 2016-10-17 | Discharge: 2016-10-21 | DRG: 747 | Disposition: A | Discharge status: Home / Self Care | Payer: Medicare Other | Source: Ambulatory Visit | Attending: Family Medicine | Admitting: Family Medicine

## 2016-10-17 ENCOUNTER — Encounter (HOSPITAL_COMMUNITY): Payer: Self-pay

## 2016-10-17 DIAGNOSIS — F17210 Nicotine dependence, cigarettes, uncomplicated: Secondary | ICD-10-CM | POA: Diagnosis present

## 2016-10-17 DIAGNOSIS — R062 Wheezing: Secondary | ICD-10-CM

## 2016-10-17 DIAGNOSIS — Z8614 Personal history of Methicillin resistant Staphylococcus aureus infection: Secondary | ICD-10-CM

## 2016-10-17 DIAGNOSIS — J449 Chronic obstructive pulmonary disease, unspecified: Secondary | ICD-10-CM | POA: Diagnosis present

## 2016-10-17 DIAGNOSIS — Z720 Tobacco use: Secondary | ICD-10-CM | POA: Diagnosis present

## 2016-10-17 DIAGNOSIS — N764 Abscess of vulva: Principal | ICD-10-CM | POA: Diagnosis present

## 2016-10-17 DIAGNOSIS — N739 Female pelvic inflammatory disease, unspecified: Secondary | ICD-10-CM

## 2016-10-17 DIAGNOSIS — G8929 Other chronic pain: Secondary | ICD-10-CM | POA: Diagnosis present

## 2016-10-17 LAB — CBC WITH DIFFERENTIAL/PLATELET
BASOS ABS: 0.1 10*3/uL (ref 0.0–0.1)
Basophils Relative: 0 %
Eosinophils Absolute: 0.4 10*3/uL (ref 0.0–0.7)
Eosinophils Relative: 3 %
HEMATOCRIT: 36.2 % (ref 36.0–46.0)
Hemoglobin: 11.6 g/dL — ABNORMAL LOW (ref 12.0–15.0)
LYMPHS PCT: 20 %
Lymphs Abs: 2.6 10*3/uL (ref 0.7–4.0)
MCH: 27.8 pg (ref 26.0–34.0)
MCHC: 32 g/dL (ref 30.0–36.0)
MCV: 86.6 fL (ref 78.0–100.0)
MONO ABS: 0.5 10*3/uL (ref 0.1–1.0)
Monocytes Relative: 4 %
NEUTROS ABS: 9.8 10*3/uL — AB (ref 1.7–7.7)
Neutrophils Relative %: 73 %
Platelets: 307 10*3/uL (ref 150–400)
RBC: 4.18 MIL/uL (ref 3.87–5.11)
RDW: 15.1 % (ref 11.5–15.5)
WBC: 13.4 10*3/uL — AB (ref 4.0–10.5)

## 2016-10-17 LAB — GC/CHLAMYDIA PROBE AMP (~~LOC~~) NOT AT ARMC
Chlamydia: NEGATIVE
NEISSERIA GONORRHEA: NEGATIVE

## 2016-10-17 LAB — BASIC METABOLIC PANEL
ANION GAP: 9 (ref 5–15)
BUN: 16 mg/dL (ref 6–20)
CO2: 26 mmol/L (ref 22–32)
Calcium: 9.1 mg/dL (ref 8.9–10.3)
Chloride: 102 mmol/L (ref 101–111)
Creatinine, Ser: 0.63 mg/dL (ref 0.44–1.00)
GFR calc Af Amer: >60 mL/min (ref >60)
GFR calc non Af Amer: >60 mL/min (ref >60)
GLUCOSE: 109 mg/dL — AB (ref 65–99)
POTASSIUM: 3.5 mmol/L (ref 3.5–5.1)
Sodium: 137 mmol/L (ref 135–145)

## 2016-10-17 MED ORDER — SODIUM CHLORIDE 0.9% FLUSH
3.0000 mL | INTRAVENOUS | Status: DC | PRN
  Administered 2016-10-20: 3 mL via INTRAVENOUS
  Filled 2016-10-17: qty 3

## 2016-10-17 MED ORDER — OXYCODONE-ACETAMINOPHEN 5-325 MG PO TABS
1.0000 | ORAL_TABLET | ORAL | Status: DC | PRN
  Administered 2016-10-17 – 2016-10-18 (×4): 1 via ORAL
  Administered 2016-10-18: 2 via ORAL
  Administered 2016-10-18: 1 via ORAL
  Administered 2016-10-19 – 2016-10-20 (×6): 2 via ORAL
  Filled 2016-10-17: qty 1
  Filled 2016-10-17 (×2): qty 2
  Filled 2016-10-17: qty 1
  Filled 2016-10-17: qty 2
  Filled 2016-10-17: qty 1
  Filled 2016-10-17 (×3): qty 2
  Filled 2016-10-17 (×2): qty 1
  Filled 2016-10-17 (×3): qty 2

## 2016-10-17 MED ORDER — MIRTAZAPINE 15 MG PO TABS
15.0000 mg | ORAL_TABLET | Freq: Every evening | ORAL | Status: DC | PRN
  Filled 2016-10-17: qty 1

## 2016-10-17 MED ORDER — VANCOMYCIN HCL IN DEXTROSE 1-5 GM/200ML-% IV SOLN
1000.0000 mg | Freq: Two times a day (BID) | INTRAVENOUS | Status: DC
  Administered 2016-10-18 – 2016-10-19 (×3): 1000 mg via INTRAVENOUS
  Filled 2016-10-17 (×3): qty 200

## 2016-10-17 MED ORDER — CYCLOBENZAPRINE HCL 10 MG PO TABS
10.0000 mg | ORAL_TABLET | Freq: Three times a day (TID) | ORAL | Status: DC | PRN
  Administered 2016-10-17: 10 mg via ORAL
  Filled 2016-10-17 (×2): qty 1

## 2016-10-17 MED ORDER — ALBUTEROL SULFATE (2.5 MG/3ML) 0.083% IN NEBU
3.0000 mL | INHALATION_SOLUTION | Freq: Four times a day (QID) | RESPIRATORY_TRACT | Status: DC | PRN
  Administered 2016-10-18 (×2): 3 mL via RESPIRATORY_TRACT
  Filled 2016-10-17 (×2): qty 3

## 2016-10-17 MED ORDER — PANTOPRAZOLE SODIUM 40 MG PO TBEC
80.0000 mg | DELAYED_RELEASE_TABLET | Freq: Every day | ORAL | Status: DC
  Administered 2016-10-18 – 2016-10-20 (×3): 80 mg via ORAL
  Filled 2016-10-17 (×3): qty 2

## 2016-10-17 MED ORDER — ALUM & MAG HYDROXIDE-SIMETH 200-200-20 MG/5ML PO SUSP: 30.0000 mL | ORAL | Status: DC | PRN

## 2016-10-17 MED ORDER — ALPRAZOLAM 0.5 MG PO TABS: 1.0000 mg | ORAL_TABLET | Freq: Four times a day (QID) | ORAL | Status: DC

## 2016-10-17 MED ORDER — ALPRAZOLAM 0.5 MG PO TABS
1.0000 mg | ORAL_TABLET | Freq: Two times a day (BID) | ORAL | Status: DC
  Administered 2016-10-17 – 2016-10-20 (×7): 1 mg via ORAL
  Filled 2016-10-17 (×7): qty 2

## 2016-10-17 MED ORDER — DULOXETINE HCL 60 MG PO CPEP
120.0000 mg | ORAL_CAPSULE | Freq: Every day | ORAL | Status: DC
  Administered 2016-10-18 – 2016-10-20 (×3): 120 mg via ORAL
  Filled 2016-10-17 (×4): qty 2

## 2016-10-17 MED ORDER — VANCOMYCIN HCL IN DEXTROSE 1-5 GM/200ML-% IV SOLN
1000.0000 mg | Freq: Once | INTRAVENOUS | Status: AC
  Administered 2016-10-17: 1000 mg via INTRAVENOUS
  Filled 2016-10-17: qty 200

## 2016-10-17 MED ORDER — GABAPENTIN 300 MG PO CAPS
300.0000 mg | ORAL_CAPSULE | Freq: Every day | ORAL | Status: DC
  Administered 2016-10-18 – 2016-10-20 (×3): 300 mg via ORAL
  Filled 2016-10-17 (×4): qty 1

## 2016-10-17 MED ORDER — GABAPENTIN 300 MG PO CAPS
600.0000 mg | ORAL_CAPSULE | Freq: Every day | ORAL | Status: DC
  Administered 2016-10-17 – 2016-10-20 (×4): 600 mg via ORAL
  Filled 2016-10-17 (×4): qty 2

## 2016-10-17 MED ORDER — NICOTINE 14 MG/24HR TD PT24
14.0000 mg | MEDICATED_PATCH | Freq: Every day | TRANSDERMAL | Status: DC
  Administered 2016-10-17 – 2016-10-20 (×4): 14 mg via TRANSDERMAL
  Filled 2016-10-17 (×5): qty 1

## 2016-10-17 MED ORDER — ENOXAPARIN SODIUM 40 MG/0.4ML ~~LOC~~ SOLN
40.0000 mg | SUBCUTANEOUS | Status: DC
  Administered 2016-10-17 – 2016-10-20 (×4): 40 mg via SUBCUTANEOUS
  Filled 2016-10-17 (×4): qty 0.4

## 2016-10-17 MED ORDER — SODIUM CHLORIDE 0.9 % IV SOLN
250.0000 mL | INTRAVENOUS | Status: DC | PRN
  Administered 2016-10-20: 250 mL via INTRAVENOUS

## 2016-10-17 MED ORDER — GUAIFENESIN 100 MG/5ML PO SOLN: 15.0000 mL | ORAL | Status: DC | PRN

## 2016-10-17 MED ORDER — MENTHOL 3 MG MT LOZG: 1.0000 | LOZENGE | OROMUCOSAL | Status: DC | PRN

## 2016-10-17 MED ORDER — QUETIAPINE FUMARATE 50 MG PO TABS
150.0000 mg | ORAL_TABLET | Freq: Every day | ORAL | Status: DC
  Administered 2016-10-17 – 2016-10-20 (×4): 150 mg via ORAL
  Filled 2016-10-17 (×4): qty 1

## 2016-10-17 MED ORDER — MORPHINE SULFATE (PF) 4 MG/ML IV SOLN: 1.0000 mg | INTRAVENOUS | Status: DC | PRN

## 2016-10-17 MED ORDER — SODIUM CHLORIDE 0.9% FLUSH
3.0000 mL | Freq: Two times a day (BID) | INTRAVENOUS | Status: DC
  Administered 2016-10-18 – 2016-10-19 (×3): 3 mL via INTRAVENOUS

## 2016-10-17 MED ORDER — GABAPENTIN 300 MG PO CAPS: 300.0000 mg | ORAL_CAPSULE | Freq: Two times a day (BID) | ORAL | Status: DC

## 2016-10-17 MED ORDER — ALPRAZOLAM 0.5 MG PO TABS
2.0000 mg | ORAL_TABLET | Freq: Every day | ORAL | Status: DC
  Administered 2016-10-17 – 2016-10-20 (×4): 2 mg via ORAL
  Filled 2016-10-17 (×4): qty 4

## 2016-10-17 NOTE — H&P (Signed)
[name] is an 56 y.o. G1P1001 female.   Chief Complaint: labial abscess HPI: Patient noted a large growth on her vulva. Noted erythema down legs and increasing pain. Swelling has gotten bigger. Seen at [facility] last pm and placed on Bactrim which irritated her stomach but she is taking. Notes small amount of drainage. She was previously seen elsewhere and diagnosed with a Bartholin's and placed on a different abx. She has chronic pain and has failed outpatient treatment.   Past Medical History:  Diagnosis Date  . ADHD (attention deficit hyperactivity disorder)   . Anginal pain ([place])   . Asthma   . BMI (body mass index) 20.0-29.9 2009 127 LBS  . Chronic abdominal pain 2003   EX LAP APR 2004 RUPTURE L OV CYST, JUL 2004 ADHESIONS  . Chronic nausea   . Clostridium difficile colitis 04/10/11 &11/2011   tx w/ flagyl  . COPD (chronic obstructive pulmonary disease) ([place])   . Degenerative disc disease   . Hiatal hernia 2008   on EGD above  . Irritable bowel syndrome 2004 CONSTIPATION  . Migraines   . MRSA infection    Dr. [name], currently under treatment  . PTSD (post-traumatic stress disorder)   . PUD (peptic ulcer disease) 01/2007   EGD Dr [name], 2 antral ulcers, negative h pylori  . Rectal prolapse   . S/P colonoscopy 06/02/09   normal ([name])  . S/P endoscopy 03/26/11   gastritis-Dr [name]  . SBO (small bowel obstruction)     Past Surgical History:  Procedure Laterality Date  . ABDOMINAL HYSTERECTOMY    . APPENDECTOMY    . BACK SURGERY    . BOWEL RESECTION     x2, secondary to adhesions  . CERVICAL SPINE SURGERY    . COLONOSCOPY  03/21/2012    [id number] ADENOMA(1) POOR PREP  . COLONOSCOPY N/A 11/17/2013   [id number] mucosa in the terminal ileum/NORMAL surgical anastomosis/Small internal hemorrhoids  . ESOPHAGOGASTRODUODENOSCOPY  03/23/2011    Normal esophagus without evidence of Barrett's, mass, erosions, or ulcerations./ Patchy erythema with occasional erosion  in the antrum.  Biopsies  obtained via cold forceps to evaluate for H. pylori gastritis/ Small hiatal hernia./Normal duodenal bulb and second portion of the duodenum.  . ESOPHAGOGASTRODUODENOSCOPY  03/21/2012   [id number] Hiatal hernia/ABDOMINALPAIN/DIARRHEA MOST LIKELY DUE TO IBS, GASTRITIS DUODENITIS  . FLEXIBLE SIGMOIDOSCOPY  07/14/2002   Normal limited flexible sigmoidoscopy with stool in the rectum and rectosigmoid precluding a full colonoscopy  . LAPAROSCOPIC LYSIS INTESTINAL ADHESIONS    . NECK SURGERY  2009  . PARTIAL HYSTERECTOMY    . TUBAL LIGATION    . UMBILICAL HERNIA REPAIR  2008   x5  . UPPER GASTROINTESTINAL ENDOSCOPY  APR 2008 [facility] BLEEDING/PAIN   PUD  . UPPER GASTROINTESTINAL ENDOSCOPY  JUL 2012 [facility] PAIN   MILD GASTRITIS    Family History  Problem Relation Age of Onset  . Adopted: Yes   Social History:  reports that she has been smoking Cigarettes.  She has a 5.00 pack-year smoking history. She has quit using smokeless tobacco. She reports that she does not drink alcohol or use drugs.  Allergies:  Allergies  Allergen Reactions  . Penicillins Anaphylaxis    Has patient had a PCN reaction causing immediate rash, facial/tongue/throat swelling, SOB or lightheadedness with hypotension: Yes Has patient had a PCN reaction causing severe rash involving mucus membranes or skin necrosis: No Has patient had a PCN reaction that required hospitalization No Has patient had a  PCN reaction occurring within the last 10 years: No If all of the above answers are "NO", then may proceed with Cephalosporin use.   . Sulfa Antibiotics Itching  . Clarithromycin Nausea And Vomiting  . Moxifloxacin Nausea And Vomiting  . Quinolones Nausea And Vomiting  . Salicylates Nausea And Vomiting  . Aspirin Other (See Comments)    Burning of stomach. REACTION: GI upset  . Ibuprofen Nausea Only    Upset stomach due to acid reflux  . Lactose Intolerance (Gi) Diarrhea  . Lactulose Diarrhea  .  Morphine And Related Itching  . Buprenorphine Hcl Itching    No current facility-administered medications on file prior to encounter.    Current Outpatient Prescriptions on File Prior to Encounter  Medication Sig Dispense Refill  . albuterol (PROVENTIL) (2.5 MG/3ML) 0.083% nebulizer solution Take 2.5 mg by nebulization every 6 (six) hours as needed for wheezing or shortness of breath.    Marland Kitchen albuterol (PROVENTIL,VENTOLIN) 90 MCG/ACT inhaler Inhale 2 puffs into the lungs every 6 (six) hours as needed. For shortness of breath    . ALPRAZolam (XANAX) 1 MG tablet Take 1 mg by mouth 4 (four) times daily. Patient takes 1 tablet twice a day and 2 tablets at bedtime    . amphetamine-dextroamphetamine (ADDERALL XR) 15 MG 24 hr capsule Take 15 mg by mouth every morning.     . calcium carbonate (TUMS - DOSED IN MG ELEMENTAL CALCIUM) 500 MG chewable tablet Chew 2 tablets by mouth 2 (two) times daily as needed for indigestion or heartburn.    . cyclobenzaprine (FLEXERIL) 10 MG tablet Take by mouth 2 (two) times daily as needed.    . diphenhydrAMINE (BENADRYL) 25 MG tablet Take 25 mg by mouth at bedtime as needed for allergies.    . DULoxetine (CYMBALTA) 60 MG capsule Take 120 mg by mouth every morning.     Marland Kitchen esomeprazole (NEXIUM) 40 MG capsule Take 1 capsule (40 mg total) by mouth 2 (two) times daily before a meal. 60 capsule 3  . gabapentin (NEURONTIN) 300 MG capsule Take 300-600 mg by mouth 2 (two) times daily. 1 capsule in the AM and 2 capsules at bedtime    . HYDROcodone-acetaminophen (NORCO) 5-325 MG tablet Take 1 tablet by mouth every 6 (six) hours as needed for moderate pain. 10 tablet 0  . ibuprofen (ADVIL,MOTRIN) 600 MG tablet Take 1 tablet (600 mg total) by mouth every 6 (six) hours as needed. 30 tablet 0  . mirtazapine (REMERON) 15 MG tablet Take 1 tablet by mouth at bedtime as needed (for rest).     . QUEtiapine (SEROQUEL) 50 MG tablet Take 150 mg by mouth at bedtime.     .  sulfamethoxazole-trimethoprim (BACTRIM DS,SEPTRA DS) 800-160 MG tablet Take 1 tablet by mouth 2 (two) times daily. 14 tablet 0  . triamcinolone cream (KENALOG) 0.1 % Apply 1 application topically 3 (three) times daily.    . Vitamin D, Ergocalciferol, (DRISDOL) 50000 units CAPS capsule Take 50,000 Units by mouth every 7 (seven) days. Takes on Thursday.      Pertinent items are noted in HPI.  Blood pressure 121/70, pulse 87, temperature 98.9 F (37.2 C), temperature source Oral, resp. rate 18, height 5\' 4"  (1.626 m), weight 160 lb (72.6 kg). General appearance: alert, cooperative and appears stated age Neck: supple, symmetrical, trachea midline Lungs: normal effort Heart: regular rate and rhythm Abdomen: soft, non-tender; bowel sounds normal; no masses,  no organomegaly Pulses: 2+ and symmetric Skin: left labia is  quite firm. There is a wound with purulent drainage noted from previous I and D. There is no fluctuance. Firmness is from gluteal fold to upper labia. there is some erythema of the mons. Neurologic: Grossly normal   Lab Results  Component Value Date   WBC 8.6 08/04/2016   HGB 11.7 (L) 08/04/2016   HCT 37.3 08/04/2016   MCV 89.7 08/04/2016   PLT 372 08/04/2016   Lab Results  Component Value Date   PREGTESTUR  08/21/2008    NEGATIVE        THE SENSITIVITY OF THIS METHODOLOGY IS >24 mIU/mL     Assessment/Plan Principal Problem:   Left genital labial abscess  For IV abx, pain control. Risks include but are not limited to bleeding, infection, injury to surrounding structures, including bowel, bladder and ureters, blood clots, and death.  Likelihood of success is high.    Donnamae Jude 10/17/2016, 5:24 PM

## 2016-10-17 NOTE — Progress Notes (Signed)
   Subjective:    Patient ID: [name] is a 56 y.o. female presenting with vaginal cyst  on 10/17/2016  HPI: Patient noted a large growth on her vulva. Noted erythema down legs and increasing pain. Swelling has gotten bigger. Seen at [facility] last pm and placed on Bactrim which irritated her stomach but she is taking. Notes small amount of drainage. She was previously seen elsewhere and diagnosed with a Bartholin's and placed on a different abx. She has chronic pain and has failed outpatient treatment.   Review of Systems  Constitutional: Negative for chills and fever.  Respiratory: Negative for shortness of breath.   Cardiovascular: Negative for chest pain.  Gastrointestinal: Negative for abdominal pain, nausea and vomiting.  Genitourinary: Negative for dysuria.  Skin: Negative for rash.      Objective:    BP 114/84   Pulse 98   Wt 160 lb (72.6 kg)   BMI 27.46 kg/m  Physical Exam  Constitutional: She is oriented to person, place, and time. She appears well-developed and well-nourished. No distress.  HENT:  Head: Normocephalic and atraumatic.  Eyes: No scleral icterus.  Neck: Neck supple.  Cardiovascular: Normal rate.   Pulmonary/Chest: Effort normal.  Abdominal: Soft.  Genitourinary:  Genitourinary Comments: Left labia enlarged extends from gluteal fold to top of labia, erythema extends to mons. Labia is firm. There is no fluctuance. Open area, with adherent pus noted.  Neurological: She is alert and oriented to person, place, and time.  Skin: Skin is warm and dry.  Psychiatric: She has a normal mood and affect.        Assessment & Plan:   Problem List Items Addressed This Visit      Unprioritized   Left genital labial abscess    Has essentially failed outpatient treatment with 2 antibiotics. Will admit for IV Vanc and pain control. Discussed with Attending and bed obtained.         Total face-to-face time with patient: 20 minutes. Over 50% of encounter was  spent on counseling and coordination of care. Return in about 2 weeks (around 10/31/2016).  Donnamae Jude 10/17/2016 4:05 PM

## 2016-10-17 NOTE — Assessment & Plan Note (Signed)
Has essentially failed outpatient treatment with 2 antibiotics. Will admit for IV Vanc and pain control. Discussed with Attending and bed obtained.

## 2016-10-17 NOTE — Progress Notes (Signed)
Pharmacy Antibiotic Note  [name] is a 56 y.o. female admitted on 10/17/2016 with left genital labial abscess.  Pharmacy has been consulted for Vancomycin dosing due to failure of previous outpatient therapy, increased erythema, drainage and pain.  Plan: Vancomycin 1 gram IV x 1 while waiting for labs. Vancomycin 1 gram IV q12h starting 0600 1/11 Pt may need increased frequency to achieve levels of 10-48mcg/ml. Will plan to draw trough ~ 4th dose  Height: 5\' 4"  (162.6 cm) Weight: 160 lb (72.6 kg) IBW/kg (Calculated) : 54.7  Temp (24hrs), Avg:98.8 F (37.1 C), Min:98.7 F (37.1 C), Max:98.9 F (37.2 C)   Recent Labs Lab 10/17/16 1908  WBC 13.4*  CREATININE 0.63    Estimated Creatinine Clearance: 75.8 mL/min (by C-G formula based on SCr of 0.63 mg/dL).   Normalized CrCl ~ 171ml/min  Allergies  Allergen Reactions  . Penicillins Anaphylaxis    Has patient had a PCN reaction causing immediate rash, facial/tongue/throat swelling, SOB or lightheadedness with hypotension: Yes Has patient had a PCN reaction causing severe rash involving mucus membranes or skin necrosis: No Has patient had a PCN reaction that required hospitalization No Has patient had a PCN reaction occurring within the last 10 years: No If all of the above answers are "NO", then may proceed with Cephalosporin use.   . Sulfa Antibiotics Itching  . Clarithromycin Nausea And Vomiting  . Moxifloxacin Nausea And Vomiting  . Quinolones Nausea And Vomiting  . Salicylates Nausea And Vomiting  . Aspirin Other (See Comments)    Burning of stomach. REACTION: GI upset  . Ibuprofen Nausea Only    Upset stomach due to acid reflux  . Lactose Intolerance (Gi) Diarrhea  . Lactulose Diarrhea  . Morphine And Related Itching  . Buprenorphine Hcl Itching    Antimicrobials this admission: Bactrim po outpatient  1/9>> 1/10  Dose adjustments this admission:   Microbiology results:   Thank you for allowing  pharmacy to be a part of this patient's care.  Vernie Ammons 10/17/2016 8:03 PM

## 2016-10-18 ENCOUNTER — Observation Stay (HOSPITAL_COMMUNITY): Payer: Medicare Other

## 2016-10-18 DIAGNOSIS — N764 Abscess of vulva: Secondary | ICD-10-CM

## 2016-10-18 LAB — MRSA PCR SCREENING: MRSA BY PCR: POSITIVE — AB

## 2016-10-18 MED ORDER — MUPIROCIN 2 % EX OINT
1.0000 "application " | TOPICAL_OINTMENT | Freq: Two times a day (BID) | CUTANEOUS | Status: DC
  Administered 2016-10-18 – 2016-10-20 (×5): 1 via NASAL
  Filled 2016-10-18: qty 22

## 2016-10-18 MED ORDER — CHLORHEXIDINE GLUCONATE CLOTH 2 % EX PADS
6.0000 | MEDICATED_PAD | Freq: Every day | CUTANEOUS | Status: DC
  Administered 2016-10-19 – 2016-10-21 (×3): 6 via TOPICAL

## 2016-10-18 MED ORDER — HYDROMORPHONE HCL 1 MG/ML IJ SOLN
1.0000 mg | Freq: Four times a day (QID) | INTRAMUSCULAR | Status: DC | PRN
  Administered 2016-10-18: 1 mg via INTRAVENOUS
  Administered 2016-10-18: 0.5 mg via INTRAVENOUS
  Administered 2016-10-19 – 2016-10-21 (×6): 1 mg via INTRAVENOUS
  Filled 2016-10-18 (×8): qty 1

## 2016-10-18 MED ORDER — IPRATROPIUM-ALBUTEROL 0.5-2.5 (3) MG/3ML IN SOLN
3.0000 mL | Freq: Four times a day (QID) | RESPIRATORY_TRACT | Status: DC
  Administered 2016-10-18 – 2016-10-21 (×11): 3 mL via RESPIRATORY_TRACT
  Filled 2016-10-18 (×12): qty 3

## 2016-10-18 NOTE — Progress Notes (Signed)
Dr. Rip Harbour notified of patient's MRSA positive PCR results. MRSA positive standing orders place.

## 2016-10-18 NOTE — Progress Notes (Signed)
Subjective: HD # 1 Labial Abscess  Pt reports pain may be a little better. She however is still requiring pain medication. She had an episode of coughing with SOB last evening. She reports she often has this problem if she eats late at night. Still requiring O2 at 2 l  Objective: AF VSS Lungs inx/exp wheezes noted Heart RRR Abd soft + BS GU labial abcess woody induration extending into the mons small skin breakdown noted  Assessment/Plan: Labial Abscess Wheezing suspect COPD/asthma  Will continue with IV vancomycin. Repeat labs in Am. Check CXR, duonebs, d/c ventolin. Consider CT scan if pain has not improved by tomorrow.    LOS: 1 day    Chancy Milroy 10/18/2016, 2:36 PM

## 2016-10-18 NOTE — Plan of Care (Signed)
Problem: Phase I Progression Outcomes Goal: O2 sats > or equal 90% or at baseline Outcome: Completed/Met Date Met: 10/18/16 With O2 per nasal canula

## 2016-10-18 NOTE — Progress Notes (Signed)
Patient coughing, congested, bilateral breath sounds diminished, with bilateral inspiratory and expiratory wheezing and rhonchi noted.  RT notified, Albuterol nebulizer treatment given.  Very sleepy, multiple attempts made to awake  Patient for treatment.  Incontinent of urine during episode of wheezing .

## 2016-10-18 NOTE — Progress Notes (Signed)
Dr Elly Modena notified of patient's respiratory difficulty.  Very drowsy, moderate stimuli used to arouse patient, speech slurred.  Patient coughed moderate amount thick white mucus stated she felt better after that.  O2 sats 94% on 4 lpm nasal canula, pulse 80s, resp 16-18. Less respiratory distress noted at this time.

## 2016-10-18 NOTE — Progress Notes (Signed)
Patient resting better now, O2 sat remains at 94% on 4 lpm O2,  pulse 80's, respirations 18 with less accessory muscles used.

## 2016-10-19 ENCOUNTER — Observation Stay (HOSPITAL_COMMUNITY): Payer: Medicare Other

## 2016-10-19 DIAGNOSIS — F17210 Nicotine dependence, cigarettes, uncomplicated: Secondary | ICD-10-CM | POA: Diagnosis present

## 2016-10-19 DIAGNOSIS — J449 Chronic obstructive pulmonary disease, unspecified: Secondary | ICD-10-CM | POA: Diagnosis present

## 2016-10-19 DIAGNOSIS — G8929 Other chronic pain: Secondary | ICD-10-CM | POA: Diagnosis present

## 2016-10-19 DIAGNOSIS — N764 Abscess of vulva: Secondary | ICD-10-CM

## 2016-10-19 DIAGNOSIS — Z8614 Personal history of Methicillin resistant Staphylococcus aureus infection: Secondary | ICD-10-CM | POA: Diagnosis not present

## 2016-10-19 LAB — VANCOMYCIN, TROUGH: Vancomycin Tr: 9 ug/mL — ABNORMAL LOW (ref 15–20)

## 2016-10-19 LAB — BASIC METABOLIC PANEL
Anion gap: 7 (ref 5–15)
BUN: 21 mg/dL — AB (ref 6–20)
CO2: 27 mmol/L (ref 22–32)
Calcium: 8.5 mg/dL — ABNORMAL LOW (ref 8.9–10.3)
Chloride: 104 mmol/L (ref 101–111)
Creatinine, Ser: 0.6 mg/dL (ref 0.44–1.00)
GFR calc Af Amer: >60 mL/min (ref >60)
GLUCOSE: 122 mg/dL — AB (ref 65–99)
POTASSIUM: 3.7 mmol/L (ref 3.5–5.1)
Sodium: 138 mmol/L (ref 135–145)

## 2016-10-19 LAB — CBC WITH DIFFERENTIAL/PLATELET
Basophils Absolute: 0 10*3/uL (ref 0.0–0.1)
Basophils Relative: 1 %
EOS PCT: 4 %
Eosinophils Absolute: 0.3 10*3/uL (ref 0.0–0.7)
HEMATOCRIT: 32.9 % — AB (ref 36.0–46.0)
Hemoglobin: 10.6 g/dL — ABNORMAL LOW (ref 12.0–15.0)
LYMPHS ABS: 1.7 10*3/uL (ref 0.7–4.0)
LYMPHS PCT: 27 %
MCH: 28.2 pg (ref 26.0–34.0)
MCHC: 32.2 g/dL (ref 30.0–36.0)
MCV: 87.5 fL (ref 78.0–100.0)
MONO ABS: 0.3 10*3/uL (ref 0.1–1.0)
Monocytes Relative: 5 %
NEUTROS ABS: 4.1 10*3/uL (ref 1.7–7.7)
Neutrophils Relative %: 63 %
PLATELETS: 228 10*3/uL (ref 150–400)
RBC: 3.76 MIL/uL — ABNORMAL LOW (ref 3.87–5.11)
RDW: 15.1 % (ref 11.5–15.5)
WBC: 6.4 10*3/uL (ref 4.0–10.5)

## 2016-10-19 LAB — PREALBUMIN: Prealbumin: 10.6 mg/dL — ABNORMAL LOW (ref 18–38)

## 2016-10-19 MED ORDER — DOXYCYCLINE HYCLATE 100 MG PO TABS
100.0000 mg | ORAL_TABLET | Freq: Two times a day (BID) | ORAL | Status: DC
  Administered 2016-10-19 – 2016-10-20 (×3): 100 mg via ORAL
  Filled 2016-10-19 (×4): qty 1

## 2016-10-19 MED ORDER — IOPAMIDOL (ISOVUE-300) INJECTION 61%
100.0000 mL | Freq: Once | INTRAVENOUS | Status: AC | PRN
  Administered 2016-10-19: 100 mL via INTRAVENOUS

## 2016-10-19 MED ORDER — VANCOMYCIN HCL 10 G IV SOLR
1250.0000 mg | Freq: Two times a day (BID) | INTRAVENOUS | Status: DC
  Filled 2016-10-19: qty 1250

## 2016-10-19 MED ORDER — CLINDAMYCIN HCL 300 MG PO CAPS
300.0000 mg | ORAL_CAPSULE | Freq: Three times a day (TID) | ORAL | Status: DC
  Administered 2016-10-19 – 2016-10-21 (×5): 300 mg via ORAL
  Filled 2016-10-19 (×6): qty 1

## 2016-10-19 NOTE — Progress Notes (Signed)
Imaging called and needed revision on order to reflect pelvic only, with contrast.  This nurse called down to [phone] reached [name],  and got an ok to change the order to reflect correction so imaging can proceed.   Original order said abd. and pelvis without contrast and imaging stated it needed to reflect above mentioned.

## 2016-10-19 NOTE — Progress Notes (Signed)
Pharmacy Antibiotic Note  [name] is a 56 y.o. female admitted on 10/17/2016 with left genital labial abcess. Pharmacy has been consulted for Vancomycin dosing due to failure of previous outpatient therapy, increased erythema, drainage and pain.   Plan: Trough drawn this am was 9 mg/dL. Plan to increase Vanc trough to 1250 mg every 12 hours.  Height: 5\' 4"  (162.6 cm) Weight: 161 lb 12 oz (73.4 kg) IBW/kg (Calculated) : 54.7  Temp (24hrs), Avg:98.3 F (36.8 C), Min:98 F (36.7 C), Max:98.6 F (37 C)   Recent Labs Lab 10/17/16 1908 10/19/16 0553  WBC 13.4* 6.4  CREATININE 0.63 0.60  VANCOTROUGH  --  9*    Estimated Creatinine Clearance: 76.2 mL/min (by C-G formula based on SCr of 0.6 mg/dL).    Allergies  Allergen Reactions  . Penicillins Anaphylaxis    Has patient had a PCN reaction causing immediate rash, facial/tongue/throat swelling, SOB or lightheadedness with hypotension: Yes Has patient had a PCN reaction causing severe rash involving mucus membranes or skin necrosis: No Has patient had a PCN reaction that required hospitalization No Has patient had a PCN reaction occurring within the last 10 years: No If all of the above answers are "NO", then may proceed with Cephalosporin use.   . Sulfa Antibiotics Itching  . Clarithromycin Nausea And Vomiting  . Moxifloxacin Nausea And Vomiting  . Quinolones Nausea And Vomiting  . Salicylates Nausea And Vomiting  . Aspirin Other (See Comments)    Burning of stomach. REACTION: GI upset  . Ibuprofen Nausea Only    Upset stomach due to acid reflux  . Lactose Intolerance (Gi) Diarrhea  . Lactulose Diarrhea  . Morphine And Related Itching  . Buprenorphine Hcl Itching    Antimicrobials this admission: Bactrim po outpatient 1/9  >> 1/10 Vancomycin 1 gram 1/10 >> 1/11   Microbiology results: 1/11 MRSA PCR: positive  Thank you for allowing pharmacy to be a part of this patient's care.  Dionne Milo 10/19/2016 10:23 AM

## 2016-10-19 NOTE — Progress Notes (Signed)
Tawanna Solo, RT for a nebulizer treatment for pt - pt has IS & EX wheezes throughtout. O2 Sat 89 - 90 on 2.5 L.

## 2016-10-19 NOTE — Progress Notes (Signed)
Subjective: Pt reports feeling better this morning. Still with labial pain but thinks some better with change in pain medication. Reports breathing is much better with new neb treatments. Tolerating diet. Ambulating and voiding without problems  Objective: AF VSS Lungs clear Heart RRR Abd soft + BS GU less woody induration and erythema from yesterday exam, abscess itself unchanged, not fluctant  Assessment/Plan: Left labial abscess Probable COPD MRSA + only from Nasal PCR  Slowly improving. Continue with Vancomycin. Consider CT scan if does not continue to improve. Do not see where I & D in OR would add anything to pt's treatment at this time. But still maybe needed.  O2 PRN now. Continue with DuoNebs. Continue with home medications.    LOS: 1 day    [name] 10/19/2016, 10:15 AM

## 2016-10-19 NOTE — Progress Notes (Signed)
Chelsea Cramp, CRNA to attempt another IV start. Pt currently has an IV (3rd in 3 days) just above her little finger which is very positional.

## 2016-10-19 NOTE — Progress Notes (Signed)
GYN Note  No inguinal LAD, right EGBUS normal. On the left side, no inguinal LAD. Approximately 2cm in depth x 3cm in width x 5-6cm in length indurated area that's somewhat purplish and dusky vs dry skin and slightly ttp. No tracking to the rectal/gluteal fold area. Former I&D site healed over and no active weeping and appears to be healing well.  Will order ct pelvis given h/o MRSA. RN and Dr. Rip Harbour who saw her yesterday and today states that they believe it looks better. Patient laying on her left side comfortably.    Pt not on o2 and they having been trying to wean her. Pt appears comfortable; will continue to try to wean and come by and reassess after CT scan. Pt is a smoker and negative cxr on admission and improved s/s with duonebs.   Durene Romans MD Attending [facility] (Faculty Practice) 10/19/2016 Time: 1209

## 2016-10-19 NOTE — Progress Notes (Signed)
GYN Note  CLINICAL DATA:  56 y/o  F; left genital labial abscess.  EXAM: CT PELVIS WITH CONTRAST  TECHNIQUE: Multidetector CT imaging of the pelvis was performed using the standard protocol following the bolus administration of intravenous contrast.  CONTRAST:  18mL ISOVUE-300 IOPAMIDOL (ISOVUE-300) INJECTION 61%  COMPARISON:  08/15/2016 CT abdomen and pelvis  FINDINGS: Urinary Tract:  No abnormality visualized.  Bowel:  Unremarkable visualized pelvic bowel loops.  Vascular/Lymphatic: No pathologically enlarged lymph nodes. No significant vascular abnormality seen.  Reproductive: There was dermal thickening of the left outer labia which is swollen and size with surrounding fat stranding extending into the left ischial anal fossa and superiorly toward the mons pubis. No discrete rim enhancing fluid collection is identified. No evidence for a intrapelvic or intraperitoneal inflammatory change.  Other: Postsurgical changes within the ventral lower abdominal wall. No ascites.  Musculoskeletal: Partially visualized posterior instrumented fusion mass of lower lumbar spine. No acute osseous abnormality is evident.  IMPRESSION: Dermal thickening of the left outer labia with inflammation extending into the left ischial anal fossa and superiorly toward the mons pubis. Findings are compatible cellulitis. No discrete rim enhancing fluid collection or soft tissue air is identified. No evidence for a intrapelvic or intraperitoneal inflammatory change.   Electronically Signed   By: Kristine Garbe M.D.   On: 10/19/2016 14:00  Given that multiple providers that have seen her have stated that it looks better and it looks unchanged to maybe even slightly better on my repeat exam just now, and her wbc is down I told her that I believe it's fine to transition her to PO. I d/w ID and they believe it may be more of strep issue but can do clinda 300 tid and doxy to  cover for both that and mrsa. Will change patient to that and continue to follow closely  I also d/w her that she may need to be on chronic O2 but she does have COPD so keeping her just in the low 90s is acceptable with O2. I took her off O2 when I was talking and examining her and she went into the 87-88 and easily went back to the low 90s. She did get another duoneb tx today and admission CXR was negative.   Durene Romans MD Attending [facility] (Faculty Practice) 10/19/2016 Time: [phone]

## 2016-10-20 DIAGNOSIS — N764 Abscess of vulva: Secondary | ICD-10-CM

## 2016-10-20 LAB — CBC WITH DIFFERENTIAL/PLATELET
BASOS ABS: 0 10*3/uL (ref 0.0–0.1)
Basophils Relative: 0 %
EOS PCT: 5 %
Eosinophils Absolute: 0.3 10*3/uL (ref 0.0–0.7)
HEMATOCRIT: 36.2 % (ref 36.0–46.0)
Hemoglobin: 11.6 g/dL — ABNORMAL LOW (ref 12.0–15.0)
LYMPHS PCT: 33 %
Lymphs Abs: 2.3 10*3/uL (ref 0.7–4.0)
MCH: 28.1 pg (ref 26.0–34.0)
MCHC: 32 g/dL (ref 30.0–36.0)
MCV: 87.7 fL (ref 78.0–100.0)
MONO ABS: 0.4 10*3/uL (ref 0.1–1.0)
MONOS PCT: 6 %
NEUTROS ABS: 3.8 10*3/uL (ref 1.7–7.7)
Neutrophils Relative %: 56 %
PLATELETS: 278 10*3/uL (ref 150–400)
RBC: 4.13 MIL/uL (ref 3.87–5.11)
RDW: 14.9 % (ref 11.5–15.5)
WBC: 6.8 10*3/uL (ref 4.0–10.5)

## 2016-10-20 NOTE — Progress Notes (Signed)
Subjective: Patient reports feeling a little better, not as much tenderness    Objective: I have reviewed patient's vital signs, intake and output, medications, labs and microbiology.  Vitals:   10/20/16 0100 10/20/16 0814 10/20/16 0900 10/20/16 1202  BP: (!) 91/46 (!) 95/43  (!) 99/48  Pulse: 74 72  75  Resp: 20 18  18   Temp: 98.7 F (37.1 C) 97.4 F (36.3 C)  97.9 F (36.6 C)  TempSrc: Oral Oral  Oral  SpO2: 94% 92%  93%  Weight:   160 lb (72.6 kg)   Height:         General: alert, cooperative and no distress left vulva induration is improved on antibiotics and S/P I/D   Assessment/Plan: 1.  Left vulvar abscess:  Improving on antibiotics, doxy and cleocin, likely involves MRSA, will continue to cover, unfortunately allergic to sulfa drugs  2.  COPD:  Requires 2 liters O2 to maintain O2 sat above 90%, may need home O2 therapy, significant contributing co morbidity  May be ready for discharge in 24-48 hours with close outpatient follow up   LOS: 2 days    Kaceton Vieau H 10/20/2016, 2:48 PM    Patient ID: [name], female   DOB: [birth year], 56 y.o.   MRN: [id number]

## 2016-10-21 ENCOUNTER — Other Ambulatory Visit: Payer: Self-pay | Admitting: Obstetrics & Gynecology

## 2016-10-21 MED ORDER — CLINDAMYCIN HCL 300 MG PO CAPS: 300.0000 mg | ORAL_CAPSULE | Freq: Three times a day (TID) | ORAL | 3 refills | Status: DC

## 2016-10-21 MED ORDER — MUPIROCIN 2 % EX OINT: 1.0000 "application " | TOPICAL_OINTMENT | Freq: Two times a day (BID) | CUTANEOUS | 0 refills | Status: DC

## 2016-10-21 MED ORDER — NICOTINE 14 MG/24HR TD PT24: 14.0000 mg | MEDICATED_PATCH | Freq: Every day | TRANSDERMAL | 0 refills | Status: DC

## 2016-10-21 MED ORDER — DOXYCYCLINE HYCLATE 100 MG PO TABS: 100.0000 mg | ORAL_TABLET | Freq: Two times a day (BID) | ORAL | 0 refills | Status: DC

## 2016-10-21 MED ORDER — OXYCODONE-ACETAMINOPHEN 5-325 MG PO TABS: 1.0000 | ORAL_TABLET | ORAL | 0 refills | Status: DC | PRN

## 2016-10-21 MED ORDER — SULFAMETHOXAZOLE-TRIMETHOPRIM 800-160 MG PO TABS: 1.0000 | ORAL_TABLET | Freq: Two times a day (BID) | ORAL | 0 refills | Status: DC

## 2016-10-21 NOTE — Discharge Summary (Signed)
Physician Discharge Summary  Patient ID: [name] MRN: [id number] DOB/AGE: [birth year] 56 y.o.  Admit date: 10/17/2016 Discharge date: 10/21/2016   Discharge Diagnoses:  Principal Problem:   Left genital labial abscess Active Problems:   Tobacco abuse   COPD (chronic obstructive pulmonary disease) (HCC)   Labial abscess   Consults: None  Significant Diagnostic Studies:  CBC    Component Value Date/Time   WBC 6.8 10/20/2016 0548   RBC 4.13 10/20/2016 0548   HGB 11.6 (L) 10/20/2016 0548   HCT 36.2 10/20/2016 0548   PLT 278 10/20/2016 0548   MCV 87.7 10/20/2016 0548   MCH 28.1 10/20/2016 0548   MCHC 32.0 10/20/2016 0548   RDW 14.9 10/20/2016 0548   LYMPHSABS 2.3 10/20/2016 0548   MONOABS 0.4 10/20/2016 0548   EOSABS 0.3 10/20/2016 0548   BASOSABS 0.0 10/20/2016 0548   CMP Latest Ref Rng & Units 10/19/2016 10/17/2016 07/30/2016  Glucose 65 - 99 mg/dL 122(H) 109(H) 92  BUN 6 - 20 mg/dL 21(H) 16 22(H)  Creatinine 0.44 - 1.00 mg/dL 0.60 0.63 0.69  Sodium 135 - 145 mmol/L 138 137 138  Potassium 3.5 - 5.1 mmol/L 3.7 3.5 4.4  Chloride 101 - 111 mmol/L 104 102 102  CO2 22 - 32 mmol/L 27 26 32  Calcium 8.9 - 10.3 mg/dL 8.5(L) 9.1 9.0  Total Protein 6.5 - 8.1 g/dL - - -  Total Bilirubin 0.3 - 1.2 mg/dL - - -  Alkaline Phos 38 - 126 U/L - - -  AST 15 - 41 U/L - - -  ALT 14 - 54 U/L - - -    Ref Range & Units 2d ago  Prealbumin 18 - 38 mg/dL 10.6     MRSA by PCR NEGATIVE POSI...      Dg Chest 2 View  Result Date: 10/18/2016 CLINICAL DATA:  Cough and congestion.  Wheezing for several days EXAM: CHEST  2 VIEW COMPARISON:  01/19/2016 FINDINGS: Normal heart size. No pleural effusion or edema. Scarring within the lateral left base is identified. No superimposed airspace consolidation. Degenerative disc disease noted within the thoracic spine. IMPRESSION: No active cardiopulmonary disease. Electronically Signed   By: Kerby Moors M.D.   On: 10/18/2016 15:25   Ct Pelvis  W Contrast  Result Date: 10/19/2016 CLINICAL DATA:  56 y/o  F; left genital labial abscess. EXAM: CT PELVIS WITH CONTRAST TECHNIQUE: Multidetector CT imaging of the pelvis was performed using the standard protocol following the bolus administration of intravenous contrast. CONTRAST:  159mL ISOVUE-300 IOPAMIDOL (ISOVUE-300) INJECTION 61% COMPARISON:  08/15/2016 CT abdomen and pelvis FINDINGS: Urinary Tract:  No abnormality visualized. Bowel:  Unremarkable visualized pelvic bowel loops. Vascular/Lymphatic: No pathologically enlarged lymph nodes. No significant vascular abnormality seen. Reproductive: There was dermal thickening of the left outer labia which is swollen and size with surrounding fat stranding extending into the left ischial anal fossa and superiorly toward the mons pubis. No discrete rim enhancing fluid collection is identified. No evidence for a intrapelvic or intraperitoneal inflammatory change. Other: Postsurgical changes within the ventral lower abdominal wall. No ascites. Musculoskeletal: Partially visualized posterior instrumented fusion mass of lower lumbar spine. No acute osseous abnormality is evident. IMPRESSION: Dermal thickening of the left outer labia with inflammation extending into the left ischial anal fossa and superiorly toward the mons pubis. Findings are compatible cellulitis. No discrete rim enhancing fluid collection or soft tissue air is identified. No evidence for a intrapelvic or intraperitoneal inflammatory change. Electronically Signed  By: Kristine Garbe M.D.   On: 10/19/2016 14:00     Hospital Course: Admitted after failing outpatient treatment for vulvar abscess. Admitted and placed on Vancomycin. Had some difficulty with breathing and an oxygen requirement throughout her hospitalization. Her vulvar abscess improved dramatically. She was transitioned to oral abx and was deemed stable for discharge.  Physical Examination: General appearance - NAD Chest -  normal effort Abdomen - soft, nontender, nondistended, no masses or organomegaly Skin - right labia is 1/10 it's size on admission. Small area of induration left midlabia     Disposition: 01-Home or Self Care  Discharged Condition: improved  Discharge Instructions    Call MD for:  persistant nausea and vomiting    Complete by:  As directed    Call MD for:  severe uncontrolled pain    Complete by:  As directed    Call MD for:  temperature >100.4    Complete by:  As directed    Diet - low sodium heart healthy    Complete by:  As directed    Discharge instructions    Complete by:  As directed    Sitz baths 3x/day or warm compresses to affected area   Increase activity slowly    Complete by:  As directed      Allergies as of 10/21/2016      Reactions   Penicillins Anaphylaxis   Has patient had a PCN reaction causing immediate rash, facial/tongue/throat swelling, SOB or lightheadedness with hypotension: Yes Has patient had a PCN reaction causing severe rash involving mucus membranes or skin necrosis: No Has patient had a PCN reaction that required hospitalization No Has patient had a PCN reaction occurring within the last 10 years: No If all of the above answers are "NO", then may proceed with Cephalosporin use.   Sulfa Antibiotics Itching   Clarithromycin Nausea And Vomiting   Moxifloxacin Nausea And Vomiting   Quinolones Nausea And Vomiting   Salicylates Nausea And Vomiting   Aspirin Other (See Comments)   Burning of stomach. REACTION: GI upset   Ibuprofen Nausea Only   Upset stomach due to acid reflux   Lactose Intolerance (gi) Diarrhea   Lactulose Diarrhea   Morphine And Related Itching   Buprenorphine Hcl Itching      Medication List    STOP taking these medications   HYDROcodone-acetaminophen 5-325 MG tablet Commonly known as:  NORCO   sulfamethoxazole-trimethoprim 800-160 MG tablet Commonly known as:  BACTRIM DS,SEPTRA DS     TAKE these medications     albuterol (2.5 MG/3ML) 0.083% nebulizer solution Commonly known as:  PROVENTIL Take 2.5 mg by nebulization every 6 (six) hours as needed for wheezing or shortness of breath.   albuterol 90 MCG/ACT inhaler Commonly known as:  PROVENTIL,VENTOLIN Inhale 2 puffs into the lungs every 6 (six) hours as needed for wheezing or shortness of breath. For shortness of breath   ALPRAZolam 1 MG tablet Commonly known as:  XANAX Take 1 mg by mouth 4 (four) times daily. Patient takes 1 tablet twice a day and 2 tablets at bedtime   amphetamine-dextroamphetamine 15 MG 24 hr capsule Commonly known as:  ADDERALL XR Take 15 mg by mouth every morning.   calcium carbonate 500 MG chewable tablet Commonly known as:  TUMS - dosed in mg elemental calcium Chew 2 tablets by mouth 2 (two) times daily as needed for indigestion or heartburn.   clindamycin 300 MG capsule Commonly known as:  CLEOCIN Take 1 capsule (300  mg total) by mouth every 8 (eight) hours.   cyclobenzaprine 10 MG tablet Commonly known as:  FLEXERIL Take 10 mg by mouth 2 (two) times daily as needed for muscle spasms.   diphenhydrAMINE 25 MG tablet Commonly known as:  BENADRYL Take 25 mg by mouth at bedtime as needed for allergies.   doxycycline 100 MG tablet Commonly known as:  VIBRA-TABS Take 1 tablet (100 mg total) by mouth every 12 (twelve) hours.   DULoxetine 60 MG capsule Commonly known as:  CYMBALTA Take 120 mg by mouth every morning.   esomeprazole 40 MG capsule Commonly known as:  NEXIUM Take 1 capsule (40 mg total) by mouth 2 (two) times daily before a meal.   gabapentin 300 MG capsule Commonly known as:  NEURONTIN Take 300-600 mg by mouth 2 (two) times daily. 1 capsule in the AM and 2 capsules at bedtime   mirtazapine 15 MG tablet Commonly known as:  REMERON Take 1 tablet by mouth at bedtime as needed (for rest).   mupirocin ointment 2 % Commonly known as:  BACTROBAN Place 1 application into the nose 2 (two) times  daily.   nicotine 14 mg/24hr patch Commonly known as:  NICODERM CQ - dosed in mg/24 hours Place 1 patch (14 mg total) onto the skin daily.   oxyCODONE-acetaminophen 5-325 MG tablet Commonly known as:  PERCOCET/ROXICET Take 1-2 tablets by mouth every 3 (three) hours as needed (moderate to severe pain (when tolerating fluids)).   QUEtiapine 50 MG tablet Commonly known as:  SEROQUEL Take 150 mg by mouth at bedtime.   triamcinolone cream 0.1 % Commonly known as:  KENALOG Apply 1 application topically 3 (three) times daily.   Vitamin D (Ergocalciferol) 50000 units Caps capsule Commonly known as:  DRISDOL Take 50,000 Units by mouth every 7 (seven) days. Takes on Thursday.      Follow-up [place] for [place] Follow up.   Specialty:  Obstetrics and Gynecology Contact information: [place], [place]. Call in 2 week(s).   Specialty:  Internal Medicine Why:  possible oxygen requirement at home Contact information: [address]. [place] [address] [phone]           Signed: Donnamae Jude 10/21/2016, 7:51 AM

## 2016-10-21 NOTE — Progress Notes (Signed)
Discharge instructions reviewed with patient.  Patient states understanding of home care, medications, activity, signs/symptoms to report to MD and return MD office visit.  Patients significant other and family will assist with her care @ home.  No home  equipment needed, patient has prescriptions and all personal belongings.  Patient discharged per wheelchair in stable condition with staff without incident.  

## 2016-10-21 NOTE — Discharge Instructions (Signed)

## 2016-10-22 ENCOUNTER — Encounter: Payer: Self-pay | Admitting: General Practice

## 2016-10-29 ENCOUNTER — Telehealth: Payer: Self-pay | Admitting: General Practice

## 2016-10-29 ENCOUNTER — Encounter: Payer: Self-pay | Admitting: Family Medicine

## 2016-10-29 NOTE — Telephone Encounter (Signed)
Patient called into front office and left message requesting refill on percocet because she is in a lot of pain. Called patient, no answer- left message stating we are trying to reach you to return your phone call please call us back.

## 2016-10-30 NOTE — Telephone Encounter (Signed)
Patient left message on nurse voicemail on 10/29/16 at 1557.  States she was in the hospital last week for a large cyst on her labia.  States it has gone down some but is still painful and bleeds some.  States she has almost finished her antibiotics and she has an appointment scheduled 11/07/16.  She would like to have a refill on her pain medication to hold her until her appointment with Dr. Kennon Rounds on 11/07/16.  Requests a return call to [phone].

## 2016-11-01 MED ORDER — OXYCODONE-ACETAMINOPHEN 5-325 MG PO TABS: 1.0000 | ORAL_TABLET | ORAL | 0 refills | Status: DC | PRN

## 2016-11-01 NOTE — Addendum Note (Signed)
Addended by: Michel Harrow on: 11/01/2016 12:03 PM   Modules accepted: Orders

## 2016-11-01 NOTE — Telephone Encounter (Signed)
LM for pt to return the call in regards to her medication refill request.  Re: Dr. Nehemiah Settle approved refill on Percocet, qty 15, and Rx is at the front office.  Also please make sure that pt uses ibuprofen in between because this Percocet qty that should get her through appt on 11/07/16.

## 2016-11-02 NOTE — Telephone Encounter (Signed)
Pt came to the office and picked up medication.

## 2016-11-07 ENCOUNTER — Ambulatory Visit (INDEPENDENT_AMBULATORY_CARE_PROVIDER_SITE_OTHER): Payer: Medicare Other | Admitting: Family Medicine

## 2016-11-07 ENCOUNTER — Encounter: Payer: Self-pay | Admitting: Family Medicine

## 2016-11-07 VITALS — BP 107/75 | HR 91 | Ht 65.0 in | Wt 161.0 lb

## 2016-11-07 DIAGNOSIS — N764 Abscess of vulva: Secondary | ICD-10-CM

## 2016-11-07 NOTE — Patient Instructions (Signed)
Perianal Abscess An abscess is an infected area that is filled with pus. A perianal abscess occurs in the perineum, which is the area between the anus and the scrotum in males and between the anus and the vagina in females. Perianal abscesses can vary in size. Without treatment, a perianal abscess can become larger and cause other problems. What are the causes? This condition is caused by:  Waste from damaged or dead tissue (debris) that plugs up glands in the perineum. When this happens, an abscess may form.  Infections of the perineum. What are the signs or symptoms? Common symptoms of this condition include:  Swelling and redness in the area of the abscess. The redness may go beyond the abscess and appear as a red streak on the skin.  Pain in the area of the abscess, including pain when sitting, walking, or passing stool. Other possible symptoms include:  A visible, painful lump, or a lump that can be felt when touched.  Bleeding or pus-like discharge from the area.  Fever.  General weakness. How is this diagnosed? This condition is diagnosed based on your medical history and a physical exam of the affected area.  This may involve examining the rectal area with a gloved hand (digital rectal exam).  Sometimes, the health care provider needs to look into the rectum using a probe or a scope.  For women, it may require a careful vaginal exam. How is this treated? Treatment for this condition may include:  Making a cut (incision) in the abscess to drain the pus. This can sometimes be done in your health care provider's office or an emergency department after you are given medicine to numb the area (local anesthetic).  Surgery to drain the abscess. This is for larger or deeper abscesses.  Antibiotic medicines, if there is infection in the surrounding tissue (cellulitis).  Having gauze packed into the abscess to continue draining the area.  Frequent baths in warm water that is  deep enough to cover your hips and buttocks (sitz baths). These help the wound heal and they make the abscess less likely to come back. Follow these instructions at home: Medicines  Take over-the-counter and prescription medicines for pain, fever, or discomfort only as told by your health care provider.  If you were prescribed an antibiotic medicine, use it as told by your health care provider. Do not stop using the antibiotic even if you start to feel better.  Do not drive or use heavy machinery while taking prescription pain medicine. Wound care  Keep the skin around the wound clean and dry. Avoid cleaning the area too much.  Avoid scratching the wound.  Avoid using colored or perfumed toilet papers.  Take a sitz bath 3-4 times a day and after bowel movements. This will help reduce pain and swelling.  If directed, apply ice to the injured area:  Put ice in a plastic bag.  Place a towel between your skin and the bag.  Leave the ice on for 20 minutes, 2-3 times a day.  Check your incision area every day for signs of infection. Check for:  More redness, swelling, or pain.  More fluid or blood.  Warmth.  Pus or a bad smell. Gauze  If gauze was used in the abscess, follow instructions from your health care provider about removing or changing the gauze. It can usually be removed in 2-3 days.  Wash your hands with soap and water before you remove or change your gauze. If soap and   water are not available, use hand sanitizer.  If one or more drains were placed in the abscess cavity, be careful not to pull at them. Your health care provider will tell you how long they need to remain in place. General instructions  Keep all follow-up visits as told by your health care provider. This is important. Contact a health care provider if:  You have trouble passing stool or passing urine.  Your pain or swelling in the affected area does not seem to be getting better.  The gauze  packing or the drains come out before the planned time. Get help right away if:  You have problems moving or using your legs.  You have severe or increasing pain.  Your swelling in the affected area suddenly gets worse.  You have a large increase in bleeding or passing of pus.  You have chills or a fever. This information is not intended to replace advice given to you by your health care provider. Make sure you discuss any questions you have with your health care provider. Document Released: 10/31/2006 Document Revised: 04/13/2016 Document Reviewed: 03/05/2016 Elsevier Interactive Patient Education  2017 [employer]  

## 2016-11-07 NOTE — Assessment & Plan Note (Signed)
Healed very well. No need for further pain meds at this time.

## 2016-11-07 NOTE — Progress Notes (Signed)
   Subjective:    Patient ID: [name] is a 56 y.o. female presenting with No chief complaint on file.  on 11/07/2016  HPI: Here for f/u vulvar abscess that required hospitalization. Has completed her Abx. Still having some pain.  Review of Systems  Constitutional: Negative for chills and fever.  Respiratory: Negative for shortness of breath.   Cardiovascular: Negative for chest pain.  Gastrointestinal: Negative for abdominal pain, nausea and vomiting.  Genitourinary: Negative for dysuria.  Skin: Negative for rash.      Objective:    There were no vitals taken for this visit. Physical Exam  Constitutional: She is oriented to person, place, and time. She appears well-developed and well-nourished. No distress.  HENT:  Head: Normocephalic and atraumatic.  Eyes: No scleral icterus.  Neck: Neck supple.  Cardiovascular: Normal rate.   Pulmonary/Chest: Effort normal.  Abdominal: Soft.  Genitourinary:     Lymphadenopathy:       Right: Inguinal adenopathy present.       Left: Inguinal adenopathy present.  Neurological: She is alert and oriented to person, place, and time.  Skin: Skin is warm and dry.  Psychiatric: She has a normal mood and affect.        Assessment & Plan:   Problem List Items Addressed This Visit      Unprioritized   Labial abscess - Primary    Healed very well. No need for further pain meds at this time.         Total face-to-face time with patient: 10 minutes. Over 50% of encounter was spent on counseling and coordination of care. Return if symptoms worsen or fail to improve.  Donnamae Jude 11/07/2016 3:08 PM

## 2016-12-27 ENCOUNTER — Ambulatory Visit (INDEPENDENT_AMBULATORY_CARE_PROVIDER_SITE_OTHER): Payer: Medicare Other | Admitting: Orthopaedic Surgery

## 2016-12-27 ENCOUNTER — Encounter (INDEPENDENT_AMBULATORY_CARE_PROVIDER_SITE_OTHER): Payer: Self-pay | Admitting: Orthopaedic Surgery

## 2016-12-27 VITALS — BP 115/64 | HR 108 | Ht 64.0 in | Wt 140.0 lb

## 2016-12-27 DIAGNOSIS — M25562 Pain in left knee: Secondary | ICD-10-CM

## 2016-12-27 NOTE — Progress Notes (Signed)
Office Visit Note   Patient: [name]           Date of Birth: Jul 18, 1961           MRN: [id number] Visit Date: 12/27/2016              Requested by: Elwyn Reach, MD [place], [place] [address] PCP: Barbette Merino, MD   Assessment & Plan: Visit Diagnoses:  1. Left knee pain, unspecified chronicity     Plan: Treatment options discussed. She has some known osteoarthritis of her knee. She used a cane used ice intermittently anti-inflammatories. Intra-articular injection performed with good relief. She'll return if she has persistent symptoms.  Follow-Up Instructions: No Follow-up on file.   Orders:  No orders of the defined types were placed in this encounter.  No orders of the defined types were placed in this encounter.     Procedures: Large Joint Inj Date/Time: 12/30/2016 9:36 PM Performed by: Marybelle Killings Authorized by: Marybelle Killings   Consent Given by:  Patient Site marked: the procedure site was marked   Indications:  Pain and joint swelling Location:  Knee Site:  R knee Needle Size:  22 G Needle Length:  1.5 inches Approach:  Anterolateral Ultrasound Guidance: No   Fluoroscopic Guidance: No   Arthrogram: No   Medications:  1 mL lidocaine 1 %; 40 mg methylPREDNISolone acetate 40 MG/ML; 0.66 mL bupivacaine 0.25 % Aspiration Attempted: No   Patient tolerance:  Patient tolerated the procedure well with no immediate complications     Clinical Data: No additional findings.   Subjective: Chief Complaint  Patient presents with  . Right Knee - Pain    Patient presents with right knee pain and swelling. She has no known injury and states that she has had pain x one month. The knee is popping and giving way. She takes an occasional aspirin with nexium. She is using a cane and ice.     Review of Systems 14 point review of systems positive for depression, allergies asthma, GERD, neck and low back pain, past history of noncompliance.  Recent labial abscess, COPD, anxiety. Previous lumbar fusion.   Objective: Vital Signs: BP 115/64   Pulse (!) 108   Ht 5\' 4"  (1.626 m)   Wt 140 lb (63.5 kg)   BMI 24.03 kg/m   Physical Exam  Constitutional: She is oriented to person, place, and time. She appears well-developed.  HENT:  Head: Normocephalic.  Right Ear: External ear normal.  Left Ear: External ear normal.  Eyes: Pupils are equal, round, and reactive to light.  Neck: No tracheal deviation present. No thyromegaly present.  Cardiovascular: Normal rate.   Pulmonary/Chest: Effort normal.  Abdominal: Soft.  Musculoskeletal:  No inguinal lymphadenopathy. Good hip range of motion.  Neurological: She is alert and oriented to person, place, and time.  Skin: Skin is warm and dry.  Psychiatric: She has a normal mood and affect. Her behavior is normal.    Ortho Exam  Specialty Comments:  No specialty comments available.  Imaging: No results found.   PMFS History: Patient Active Problem List   Diagnosis Date Noted  . Left genital labial abscess 10/17/2016  . Labial abscess 10/17/2016  . Anxiety state 03/15/2015  . Anemia 03/14/2015  . COPD (chronic obstructive pulmonary disease) ([facility]) 03/13/2015  . Ileus ([facility]) 04/18/2014  . Tobacco abuse 04/18/2014  . Hx of adenomatous colonic polyps 09/07/2013  . Muscle spasms of neck 06/09/2013  . Cervical  pain 03/17/2013  . C. difficile colitis 06/25/2011  . Chronic nausea 03/13/2011  . DEPRESSION 05/09/2007  . ALLERGIC RHINITIS 05/09/2007  . ASTHMA 05/09/2007  . GERD 05/09/2007  . PEPTIC ULCER DISEASE 05/09/2007  . IBS 05/09/2007  . ARTHRITIS 05/09/2007  . HIP PAIN, LEFT 05/09/2007  . NECK PAIN, CHRONIC 05/09/2007  . LOW BACK PAIN 05/09/2007  . CONSTIPATION, HX OF 05/09/2007  . HX, PERSONAL, PAST NONCOMPLIANCE 05/09/2007   Past Medical History:  Diagnosis Date  . ADHD (attention deficit hyperactivity disorder)   . Anginal pain ([facility])   . Asthma   . BMI (body  mass index) 20.0-29.9 2009 127 LBS  . Chronic abdominal pain 2003   EX LAP APR 2004 RUPTURE L OV CYST, JUL 2004 ADHESIONS  . Chronic nausea   . Clostridium difficile colitis 04/10/11 &11/2011   tx w/ flagyl  . COPD (chronic obstructive pulmonary disease) ([facility])   . Degenerative disc disease   . Hiatal hernia 2008   on EGD above  . Irritable bowel syndrome 2004 CONSTIPATION  . Migraines   . MRSA infection    Dr. [name], currently under treatment  . PTSD (post-traumatic stress disorder)   . PUD (peptic ulcer disease) 01/2007   EGD Dr [name], 2 antral ulcers, negative h pylori  . Rectal prolapse   . S/P colonoscopy 06/02/09   normal ([name])  . S/P endoscopy 03/26/11   gastritis-Dr [name]  . SBO (small bowel obstruction)     Family History  Problem Relation Age of Onset  . Adopted: Yes    Past Surgical History:  Procedure Laterality Date  . ABDOMINAL HYSTERECTOMY    . APPENDECTOMY    . BACK SURGERY    . BOWEL RESECTION     x2, secondary to adhesions  . CERVICAL SPINE SURGERY    . COLONOSCOPY  03/21/2012    MEQ:ASTMHD ADENOMA(1) POOR PREP  . COLONOSCOPY N/A 11/17/2013   QQI:WLNLGX mucosa in the terminal ileum/NORMAL surgical anastomosis/Small internal hemorrhoids  . ESOPHAGOGASTRODUODENOSCOPY  03/23/2011    Normal esophagus without evidence of Barrett's, mass, erosions, or ulcerations./ Patchy erythema with occasional erosion in the antrum.  Biopsies  obtained via cold forceps to evaluate for H. pylori gastritis/ Small hiatal hernia./Normal duodenal bulb and second portion of the duodenum.  . ESOPHAGOGASTRODUODENOSCOPY  03/21/2012   QJJ:HERDE Hiatal hernia/ABDOMINALPAIN/DIARRHEA MOST LIKELY DUE TO IBS, GASTRITIS DUODENITIS  . FLEXIBLE SIGMOIDOSCOPY  07/14/2002   Normal limited flexible sigmoidoscopy with stool in the rectum and rectosigmoid precluding a full colonoscopy  . LAPAROSCOPIC LYSIS INTESTINAL ADHESIONS    . NECK SURGERY  2009  . PARTIAL HYSTERECTOMY    .  TUBAL LIGATION    . UMBILICAL HERNIA REPAIR  2008   x5  . UPPER GASTROINTESTINAL ENDOSCOPY  APR 2008 [facility] BLEEDING/PAIN   PUD  . UPPER GASTROINTESTINAL ENDOSCOPY  JUL 2012 [facility] PAIN   MILD GASTRITIS   Social History   Occupational History  . disabled    Social History Main Topics  . Smoking status: Current Every Day Smoker    Packs/day: 0.50    Years: 10.00    Types: Cigarettes  . Smokeless tobacco: Former Systems developer  . Alcohol use No  . Drug use: No  . Sexual activity: No

## 2016-12-30 DIAGNOSIS — M25562 Pain in left knee: Secondary | ICD-10-CM | POA: Diagnosis not present

## 2016-12-30 MED ORDER — METHYLPREDNISOLONE ACETATE 40 MG/ML IJ SUSP
40.0000 mg | INTRAMUSCULAR | Status: AC | PRN
  Administered 2016-12-30: 40 mg via INTRA_ARTICULAR

## 2016-12-30 MED ORDER — BUPIVACAINE HCL 0.25 % IJ SOLN
0.6600 mL | INTRAMUSCULAR | Status: AC | PRN
  Administered 2016-12-30: .66 mL via INTRA_ARTICULAR

## 2016-12-30 MED ORDER — LIDOCAINE HCL 1 % IJ SOLN
1.0000 mL | INTRAMUSCULAR | Status: AC | PRN
  Administered 2016-12-30: 1 mL

## 2017-01-10 ENCOUNTER — Encounter (INDEPENDENT_AMBULATORY_CARE_PROVIDER_SITE_OTHER): Payer: Self-pay | Admitting: Orthopaedic Surgery

## 2017-01-10 ENCOUNTER — Ambulatory Visit (INDEPENDENT_AMBULATORY_CARE_PROVIDER_SITE_OTHER): Payer: Medicare Other | Admitting: Orthopaedic Surgery

## 2017-01-10 VITALS — BP 103/68 | HR 82 | Ht 64.0 in | Wt 150.0 lb

## 2017-01-10 DIAGNOSIS — M25561 Pain in right knee: Secondary | ICD-10-CM

## 2017-01-10 NOTE — Progress Notes (Signed)
Office Visit Note   Patient: [name]           Date of Birth: [birth year]           MRN: [id number] Visit Date: 01/10/2017              Requested by: Elwyn Reach, MD [place], [place] [address] PCP: Barbette Merino, MD   Assessment & Plan: Visit Diagnoses:  1. Acute pain of right knee     Plan: Patient has used Tylenol, used a cane had an intra-articular injection is now still with persistent right knee pain and catching. She has medial joint line tenderness will obtain an MRI to rule out either medial tibial stress fracture versus medial meniscal tear. Office follow-up after her scan for review.  Follow-Up Instructions: No Follow-up on file.   Orders:  Orders Placed This Encounter  Procedures  . MR Knee Right w/o contrast   No orders of the defined types were placed in this encounter.     Procedures: No procedures performed   Clinical Data: No additional findings.   Subjective: Chief Complaint  Patient presents with  . Right Knee - Pain, Follow-up    HPI patient states she's had significant pain in her knee since one month ago. She hasn't had any increase in activity she normally walks her dog on a daily basis. 6 months ago she slipped on wet floor landing on her right knee. She's taken anti-inflammatories had one prescription for Percocet states she's not taking it now. She has been in a pain clinic in the distant past. This was after back and neck surgery. She had an intra-articular injection states it helped for about a week and then she's had recurrence of pain states can ambulate without her walker and has pain in the over the past bursa medial tibia.  Review of Systems  Constitutional: Negative for chills and diaphoresis.  HENT: Negative for ear discharge, ear pain and nosebleeds.        Positive for rhinosinusitis with MRSA treated by Dr. Midge Aver in the past.  Eyes: Negative for discharge and visual disturbance.  Respiratory: Negative  for cough, choking and shortness of breath.        Asthma  Cardiovascular: Negative for chest pain and palpitations.  Gastrointestinal: Negative for abdominal distention and abdominal pain.       Process for GERD history of abdominal pain history of C differential colitis, constipation.  Endocrine: Negative for cold intolerance and heat intolerance.  Genitourinary: Negative for flank pain and hematuria.  Musculoskeletal:       History of neck and back problems previous pain clinic treatment.  Skin: Negative for rash and wound.  Allergic/Immunologic:       Patient has a long list of multiple allergies  Neurological: Negative for seizures and speech difficulty.  Hematological: Negative for adenopathy. Does not bruise/bleed easily.  Psychiatric/Behavioral: Negative for agitation and suicidal ideas.       Positive for anxiety, ADHD     Objective: Vital Signs: BP 103/68   Pulse 82   Ht 5\' 4"  (1.626 m)   Wt 150 lb (68 kg)   BMI 25.75 kg/m   Physical Exam  Constitutional: She is oriented to person, place, and time. She appears well-developed.  HENT:  Head: Normocephalic.  Right Ear: External ear normal.  Left Ear: External ear normal.  Eyes: Pupils are equal, round, and reactive to light.  Neck: No tracheal deviation present. No thyromegaly present.  Cardiovascular: Normal rate.   Pulmonary/Chest: Effort normal.  Abdominal: Soft.  Neurological: She is alert and oriented to person, place, and time.  Skin: Skin is warm and dry.  Psychiatric: She has a normal mood and affect. Her behavior is normal.    Ortho Exam patient has good range of motion of the shoulders without impingement. Lower extremity reflexes are 2+ and symmetrical. No pain with internal/external rotation of her hips. Pain with right knee hyperextension. Medial joint line tenderness more than lateral. Lachman and pivot shift test is negative. Pedal pulses are symmetrical. She has tenderness over the proximal tibial  plateau with palpation and pes bursa. bursitis is present. Hamstrings are normal. No palpable Baker's cyst popliteal pulse normal. She's walking with the mild limp .  Specialty Comments:  No specialty comments available.  Imaging: No results found.   PMFS History: Patient Active Problem List   Diagnosis Date Noted  . Acute pain of right knee 01/13/2017  . Left genital labial abscess 10/17/2016  . Labial abscess 10/17/2016  . Anxiety state 03/15/2015  . Anemia 03/14/2015  . COPD (chronic obstructive pulmonary disease) ([place]) 03/13/2015  . Ileus ([place]) 04/18/2014  . Tobacco abuse 04/18/2014  . Hx of adenomatous colonic polyps 09/07/2013  . Muscle spasms of neck 06/09/2013  . Cervical pain 03/17/2013  . C. difficile colitis 06/25/2011  . Chronic nausea 03/13/2011  . DEPRESSION 05/09/2007  . ALLERGIC RHINITIS 05/09/2007  . ASTHMA 05/09/2007  . GERD 05/09/2007  . PEPTIC ULCER DISEASE 05/09/2007  . IBS 05/09/2007  . ARTHRITIS 05/09/2007  . HIP PAIN, LEFT 05/09/2007  . NECK PAIN, CHRONIC 05/09/2007  . LOW BACK PAIN 05/09/2007  . CONSTIPATION, HX OF 05/09/2007  . HX, PERSONAL, PAST NONCOMPLIANCE 05/09/2007   Past Medical History:  Diagnosis Date  . ADHD (attention deficit hyperactivity disorder)   . Anginal pain ([facility] [place])   . Asthma   . BMI (body mass index) 20.0-29.9 2009 127 LBS  . Chronic abdominal pain 2003   EX LAP APR 2004 RUPTURE L OV CYST, JUL 2004 ADHESIONS  . Chronic nausea   . Clostridium difficile colitis 04/10/11 &11/2011   tx w/ flagyl  . COPD (chronic obstructive pulmonary disease) ([place])   . Degenerative disc disease   . Hiatal hernia 2008   on EGD above  . Irritable bowel syndrome 2004 CONSTIPATION  . Migraines   . MRSA infection    Dr. [name], currently under treatment  . PTSD (post-traumatic stress disorder)   . PUD (peptic ulcer disease) 01/2007   EGD Dr [name], 2 antral ulcers, negative h pylori  . Rectal prolapse   . S/P colonoscopy 06/02/09     normal ([name])  . S/P endoscopy 03/26/11   gastritis-Dr [name]  . SBO (small bowel obstruction)     Family History  Problem Relation Age of Onset  . Adopted: Yes    Past Surgical History:  Procedure Laterality Date  . ABDOMINAL HYSTERECTOMY    . APPENDECTOMY    . BACK SURGERY    . BOWEL RESECTION     x2, secondary to adhesions  . CERVICAL SPINE SURGERY    . COLONOSCOPY  03/21/2012    HRC:BULAGT ADENOMA(1) POOR PREP  . COLONOSCOPY N/A 11/17/2013   XMI:WOEHOZ mucosa in the terminal ileum/NORMAL surgical anastomosis/Small internal hemorrhoids  . ESOPHAGOGASTRODUODENOSCOPY  03/23/2011    Normal esophagus without evidence of Barrett's, mass, erosions, or ulcerations./ Patchy erythema with occasional erosion in the antrum.  Biopsies  obtained via cold forceps to  evaluate for H. pylori gastritis/ Small hiatal hernia./Normal duodenal bulb and second portion of the duodenum.  . ESOPHAGOGASTRODUODENOSCOPY  03/21/2012   CVK:FMMCR Hiatal hernia/ABDOMINALPAIN/DIARRHEA MOST LIKELY DUE TO IBS, GASTRITIS DUODENITIS  . FLEXIBLE SIGMOIDOSCOPY  07/14/2002   Normal limited flexible sigmoidoscopy with stool in the rectum and rectosigmoid precluding a full colonoscopy  . LAPAROSCOPIC LYSIS INTESTINAL ADHESIONS    . NECK SURGERY  2009  . PARTIAL HYSTERECTOMY    . TUBAL LIGATION    . UMBILICAL HERNIA REPAIR  2008   x5  . UPPER GASTROINTESTINAL ENDOSCOPY  APR 2008 [facility] BLEEDING/PAIN   PUD  . UPPER GASTROINTESTINAL ENDOSCOPY  JUL 2012 [facility] PAIN   MILD GASTRITIS   Social History   Occupational History  . disabled    Social History Main Topics  . Smoking status: Current Every Day Smoker    Packs/day: 0.50    Years: 10.00    Types: Cigarettes  . Smokeless tobacco: Former Systems developer  . Alcohol use No  . Drug use: No  . Sexual activity: No

## 2017-01-13 DIAGNOSIS — M25561 Pain in right knee: Secondary | ICD-10-CM | POA: Insufficient documentation

## 2017-01-24 ENCOUNTER — Ambulatory Visit (INDEPENDENT_AMBULATORY_CARE_PROVIDER_SITE_OTHER): Payer: Medicare Other | Admitting: Orthopaedic Surgery

## 2017-03-21 ENCOUNTER — Telehealth (INDEPENDENT_AMBULATORY_CARE_PROVIDER_SITE_OTHER): Payer: Self-pay | Admitting: Radiology

## 2017-03-21 MED ORDER — TRAMADOL HCL 50 MG PO TABS: 50.0000 mg | ORAL_TABLET | Freq: Two times a day (BID) | ORAL | 0 refills | Status: DC | PRN

## 2017-03-21 MED ORDER — ACETAMINOPHEN-CODEINE #3 300-30 MG PO TABS: 1.0000 | ORAL_TABLET | Freq: Two times a day (BID) | ORAL | 0 refills | Status: DC | PRN

## 2017-03-21 NOTE — Telephone Encounter (Signed)
Call her has 11 allergies.  Ok for [employer] # 15    One po bid prn.   ( do not want to do anything stronger former pain clinic pt. Etc. ) ucall thanks

## 2017-03-21 NOTE — Telephone Encounter (Signed)
Patient would like to know if you would call in Tylenol #3 instead of the tramadol? It works better for her.

## 2017-03-21 NOTE — Telephone Encounter (Signed)
Patient requests pain medication for knee pain. She had to reschedule her MRI due to having dental issues and multiple teeth pulled. She is scheduled for MRI Knee on 03/25/2017 and has follow up in the office on 03/28/2017. Please advise.  Patient call back [phone]

## 2017-03-21 NOTE — Telephone Encounter (Signed)
I spoke with patient. She has taken tramadol before. She would like called to [facility].  I called in script.

## 2017-03-21 NOTE — Telephone Encounter (Signed)
I left voicemail for patient requesting return call so that I can advise.

## 2017-03-21 NOTE — Telephone Encounter (Signed)
Per Dr. Lorin Mercy, ok for tylenol #3. Same instructions. Called and canceled tramadol at [facility] and called in script for tylenol #3. Patient advised. Spoke with Margreta Journey at [facility].

## 2017-03-25 ENCOUNTER — Encounter: Payer: Self-pay | Admitting: Orthopaedic Surgery

## 2017-03-26 ENCOUNTER — Telehealth (INDEPENDENT_AMBULATORY_CARE_PROVIDER_SITE_OTHER): Payer: Self-pay | Admitting: *Deleted

## 2017-03-26 NOTE — Telephone Encounter (Signed)
Please advise. Patient had tylenol #3 1 po bid prn pain #15 sent in on 03/21/17.

## 2017-03-26 NOTE — Telephone Encounter (Signed)
Pt calling asking for stronger pain medicine or increase her doseage. Pt stated she is having trouble sleeping

## 2017-03-28 ENCOUNTER — Ambulatory Visit (INDEPENDENT_AMBULATORY_CARE_PROVIDER_SITE_OTHER): Payer: Medicare Other | Admitting: Orthopaedic Surgery

## 2017-03-28 ENCOUNTER — Encounter (INDEPENDENT_AMBULATORY_CARE_PROVIDER_SITE_OTHER): Payer: Self-pay | Admitting: Orthopaedic Surgery

## 2017-03-28 VITALS — BP 126/82 | HR 101

## 2017-03-28 DIAGNOSIS — M1711 Unilateral primary osteoarthritis, right knee: Secondary | ICD-10-CM

## 2017-03-28 MED ORDER — ACETAMINOPHEN-CODEINE #3 300-30 MG PO TABS: 1.0000 | ORAL_TABLET | Freq: Two times a day (BID) | ORAL | 0 refills | Status: DC | PRN

## 2017-03-28 NOTE — Progress Notes (Signed)
Office Visit Note   Patient: [name]           Date of Birth: [birth year]           MRN: [id number] Visit Date: 03/28/2017              Requested by: Elwyn Reach, MD [place], [place] [address] PCP: Elwyn Reach, MD   Assessment & Plan: Visit Diagnoses:  1. Unilateral primary osteoarthritis, right knee     Plan: Patient with right knee osteoarthritis having difficulty walking as a fall risk. She needs to use her walker continue using ice. She is failed anti-inflammatories intra-articular injections. She needs to make some family arrangements for care after total knee arthroplasty if she is to avoid skilled nursing facility short-term since she lives alone. She states she has a brother and sister-in-law who could be available. We discussed the total knee arthroplasty procedure do not stay in the hospital outpatient physical therapy importance of compliance with therapy, home health PT for approximately 2 weeks. We renewed her Tylenol 3. We discussed caution with the postoperative pain medication with her anxiety medicine and depression medicine. She states she like to proceed with surgical scheduling. Wrist surgery discussed including stiffness, revision, fracture, anesthetic complications. Questions were elicited and answered she requests we proceed.  Follow-Up Instructions: No Follow-up on file.   Orders:  No orders of the defined types were placed in this encounter.  Meds ordered this encounter  Medications  . acetaminophen-codeine (TYLENOL #3) 300-30 MG tablet    Sig: Take 1 tablet by mouth 2 (two) times daily as needed for moderate pain.    Dispense:  15 tablet    Refill:  0      Procedures: No procedures performed   Clinical Data: No additional findings.   Subjective: Chief Complaint  Patient presents with  . Right Knee - Pain    HPI patient returns post knee injection, right knee in April which gave her temporary relief for a week  or so. She states the pain is severe she is having an Ace wrap applied tightly has to walk with a straight knee gait with her foot in external Rotation. She is ambulatory with a cane and has a walker that she uses at home. Pain is worse the more she is up on her foot she's used ice intermittently as well as topical cream without relief Tylenol, Norco in the past, currently Tylenol 3. Patient states she can't go on like this and needs something done.  Review of Systems 14 point review of systems positive for history of rhinosinusitis with positive MRSA culture. Positive for GERD, history of C. difficile colitis, constipation. Previous lumbar surgery with fusion, cervical fusion. Positive for anxiety, ADHD, posttraumatic stress syndrome related to an incident 15 years ago. She is on antidepressant medication and also Xanax in the past and is currently on Ativan. She uses inhalers Proventil. Otherwise negative as it pertains or history of present illness.   Objective: Vital Signs: BP 126/82   Pulse (!) 101   Physical Exam  Constitutional: She is oriented to person, place, and time. She appears well-developed.  HENT:  Head: Normocephalic.  Right Ear: External ear normal.  Left Ear: External ear normal.  Eyes: Pupils are equal, round, and reactive to light.  Neck: No tracheal deviation present. No thyromegaly present.  Healed anterior cervical incision.  Cardiovascular: Normal rate.   Pulmonary/Chest: Effort normal.  Abdominal: Soft.  Musculoskeletal:  Healed  lumbar incision from previous fusion. Normal hip range of motion right and left negative Faber negative straight leg raising. Minimal crepitus left knee good extension good flexion no palpable osteophytes no tenderness. Anterior cruciate ligament PCL collateral ligaments are normal. Distal pulses are 2+ no venous stasis changes. Right knee shows severe medial joint line tenderness positive patellofemoral compression test crepitus with range of  motion 2+ knee effusion collateral crucial ligament exam is normal. She can passively reach full extension has sharp pain with active extension point still the medial patellar facet. Negative patellar subluxation. Pes bursa hamstrings are normal. No palpable Baker's cyst. Anterior tib EHL distal pulses are normal.  Neurological: She is alert and oriented to person, place, and time.  Skin: Skin is warm and dry.  Psychiatric: She has a normal mood and affect. Her behavior is normal.    Ortho Exam  Specialty Comments:  No specialty comments available.  Imaging: MRI right knee read on 03/25/2017 shows moderate osteoarthritis most significant in the medial and patellofemoral compartment with fraying of the medial meniscus but negative for meniscal tear. Subchondral edema on the medial tibial plateau with marginal osteophytes and along the lateral patellar facet with joint effusion.   PMFS History: Patient Active Problem List   Diagnosis Date Noted  . Unilateral primary osteoarthritis, right knee 03/28/2017  . Acute pain of right knee 01/13/2017  . Left genital labial abscess 10/17/2016  . Labial abscess 10/17/2016  . Anxiety state 03/15/2015  . Anemia 03/14/2015  . COPD (chronic obstructive pulmonary disease) ([place]) 03/13/2015  . Ileus ([place]) 04/18/2014  . Tobacco abuse 04/18/2014  . Hx of adenomatous colonic polyps 09/07/2013  . Muscle spasms of neck 06/09/2013  . Cervical pain 03/17/2013  . C. difficile colitis 06/25/2011  . Chronic nausea 03/13/2011  . DEPRESSION 05/09/2007  . ALLERGIC RHINITIS 05/09/2007  . ASTHMA 05/09/2007  . GERD 05/09/2007  . PEPTIC ULCER DISEASE 05/09/2007  . IBS 05/09/2007  . ARTHRITIS 05/09/2007  . HIP PAIN, LEFT 05/09/2007  . NECK PAIN, CHRONIC 05/09/2007  . LOW BACK PAIN 05/09/2007  . CONSTIPATION, HX OF 05/09/2007  . HX, PERSONAL, PAST NONCOMPLIANCE 05/09/2007   Past Medical History:  Diagnosis Date  . ADHD (attention deficit hyperactivity  disorder)   . Anginal pain ([place])   . Asthma   . BMI (body mass index) 20.0-29.9 2009 127 LBS  . Chronic abdominal pain 2003   EX LAP APR 2004 RUPTURE L OV CYST, JUL 2004 ADHESIONS  . Chronic nausea   . Clostridium difficile colitis 04/10/11 &11/2011   tx w/ flagyl  . COPD (chronic obstructive pulmonary disease) ([facility])   . Degenerative disc disease   . Hiatal hernia 2008   on EGD above  . Irritable bowel syndrome 2004 CONSTIPATION  . Migraines   . MRSA infection    Dr. [name], currently under treatment  . PTSD (post-traumatic stress disorder)   . PUD (peptic ulcer disease) 01/2007   EGD Dr [name], 2 antral ulcers, negative h pylori  . Rectal prolapse   . S/P colonoscopy 06/02/09   normal ([name])  . S/P endoscopy 03/26/11   gastritis-Dr [name]  . SBO (small bowel obstruction) ([facility])     Family History  Problem Relation Age of Onset  . Adopted: Yes    Past Surgical History:  Procedure Laterality Date  . ABDOMINAL HYSTERECTOMY    . APPENDECTOMY    . BACK SURGERY    . BOWEL RESECTION     x2, secondary to adhesions  .  CERVICAL SPINE SURGERY    . COLONOSCOPY  03/21/2012    YQM:VHQION ADENOMA(1) POOR PREP  . COLONOSCOPY N/A 11/17/2013   GEX:BMWUXL mucosa in the terminal ileum/NORMAL surgical anastomosis/Small internal hemorrhoids  . ESOPHAGOGASTRODUODENOSCOPY  03/23/2011    Normal esophagus without evidence of Barrett's, mass, erosions, or ulcerations./ Patchy erythema with occasional erosion in the antrum.  Biopsies  obtained via cold forceps to evaluate for H. pylori gastritis/ Small hiatal hernia./Normal duodenal bulb and second portion of the duodenum.  . ESOPHAGOGASTRODUODENOSCOPY  03/21/2012   KGM:WNUUV Hiatal hernia/ABDOMINALPAIN/DIARRHEA MOST LIKELY DUE TO IBS, GASTRITIS DUODENITIS  . FLEXIBLE SIGMOIDOSCOPY  07/14/2002   Normal limited flexible sigmoidoscopy with stool in the rectum and rectosigmoid precluding a full colonoscopy  . LAPAROSCOPIC LYSIS INTESTINAL  ADHESIONS    . NECK SURGERY  2009  . PARTIAL HYSTERECTOMY    . TUBAL LIGATION    . UMBILICAL HERNIA REPAIR  2008   x5  . UPPER GASTROINTESTINAL ENDOSCOPY  APR 2008 [facility] BLEEDING/PAIN   PUD  . UPPER GASTROINTESTINAL ENDOSCOPY  JUL 2012 [facility] PAIN   MILD GASTRITIS   Social History   Occupational History  . disabled    Social History Main Topics  . Smoking status: Current Every Day Smoker    Packs/day: 0.50    Years: 10.00    Types: Cigarettes  . Smokeless tobacco: Former Systems developer  . Alcohol use No  . Drug use: No  . Sexual activity: No

## 2017-03-28 NOTE — Telephone Encounter (Signed)
Pt has appt today to review scan

## 2017-03-28 NOTE — Telephone Encounter (Signed)
I have tried to reach her several times over last 2 days. No one picks up phone. I left a message first time. She can use a cane, get MRI as scheduled then ROV.  On xanax, antidepressant, already. Stronger narcotic not good plan right now. ROV after scan. thanks

## 2017-04-08 ENCOUNTER — Telehealth (INDEPENDENT_AMBULATORY_CARE_PROVIDER_SITE_OTHER): Payer: Self-pay | Admitting: Radiology

## 2017-04-08 ENCOUNTER — Other Ambulatory Visit (INDEPENDENT_AMBULATORY_CARE_PROVIDER_SITE_OTHER): Payer: Self-pay | Admitting: Radiology

## 2017-04-08 MED ORDER — ACETAMINOPHEN-CODEINE #3 300-30 MG PO TABS: 1.0000 | ORAL_TABLET | Freq: Two times a day (BID) | ORAL | 0 refills | Status: DC | PRN

## 2017-04-08 NOTE — Telephone Encounter (Signed)
I spoke with [name].  She is pending clearance for surgery from her Medical doctor. Advised that I would send clearance request form to Dr. Jonelle Sidle and that he may request that she be seen first for an appointment.

## 2017-04-08 NOTE — Telephone Encounter (Signed)
IC and LMVM with Rx at pharm, IC pt and advised.  LMVM

## 2017-04-08 NOTE — Telephone Encounter (Signed)
Ok call in refill tylenol # 3 refill thanks

## 2017-04-08 NOTE — Telephone Encounter (Signed)
Patiene called to triage to request meds, but said she had left you several messages and is wanting to schedule surgery.  I told her you had a list of patients to call and would get to her, just wanted to send to you as well.  Maybe you can call her soon, she is an [place] patient, I believe for a TKA.  I sent msg to Lorin Mercy about meds.  Thanks.

## 2017-04-08 NOTE — Telephone Encounter (Signed)
Patient called and said that she is in a lot of pain, is icing and elevating, but wants to see if she can have a Rx T#3, the OTC meds upset her stomach and regular tylenol is not strong enough.  The T#3 really helped her last time.  Please advise? Thanks

## 2017-04-19 ENCOUNTER — Telehealth (INDEPENDENT_AMBULATORY_CARE_PROVIDER_SITE_OTHER): Payer: Self-pay

## 2017-04-19 ENCOUNTER — Telehealth (INDEPENDENT_AMBULATORY_CARE_PROVIDER_SITE_OTHER): Payer: Self-pay | Admitting: Orthopaedic Surgery

## 2017-04-19 ENCOUNTER — Other Ambulatory Visit (INDEPENDENT_AMBULATORY_CARE_PROVIDER_SITE_OTHER): Payer: Self-pay | Admitting: Family

## 2017-04-19 NOTE — Telephone Encounter (Signed)
Patient returned call prior to me being able to call in her medicine. Med called in by [facility].  Can you please advise on surgery?

## 2017-04-19 NOTE — Telephone Encounter (Signed)
Patient calling checking the status of her surgery, she states she spoke to her family doctor and they told her they gave the clearance and sent to [place]. She states she is in incredible pain.

## 2017-04-19 NOTE — Telephone Encounter (Signed)
Call in more tylenol # 3  # 40 1 po tid. Ask cheryl if she has her approved for surgery so she can be on schedule thanks

## 2017-04-19 NOTE — Telephone Encounter (Signed)
Please advise. Patient had Tylenol #3 1 po bid prn pain #15 prescribed on 04/08/2017

## 2017-04-19 NOTE — Telephone Encounter (Signed)
I spoke with [name].  We are going to schedule her for Dr. Lorin Mercy first avail surgery time slot.  I have not heard of the form she is describing. She has an aide that helps her, once we schedule I will try to have her explain this to me in more detail so that we can help her with this paperwork.

## 2017-04-19 NOTE — Telephone Encounter (Signed)
Patient calling asking for something stronger for pain or permission to take what she has more.

## 2017-04-19 NOTE — Telephone Encounter (Signed)
Patient's caregiver called to check on status of surgery and if we have a DMA 3015 form for the patient. Please advise.

## 2017-04-19 NOTE — Telephone Encounter (Signed)
I spoke with Mickel Baas. Advised that we did have note from her PCP stating that she was cleared. She would like to schedule for the first avail appt.

## 2017-04-19 NOTE — Telephone Encounter (Signed)
Fyi...duplicate message in regards to surgery.  I am unsure of form?

## 2017-04-23 ENCOUNTER — Ambulatory Visit (INDEPENDENT_AMBULATORY_CARE_PROVIDER_SITE_OTHER): Payer: Medicare Other

## 2017-04-23 ENCOUNTER — Encounter (INDEPENDENT_AMBULATORY_CARE_PROVIDER_SITE_OTHER): Payer: Self-pay | Admitting: Orthopedic Surgery

## 2017-04-23 ENCOUNTER — Encounter (INDEPENDENT_AMBULATORY_CARE_PROVIDER_SITE_OTHER): Payer: Self-pay | Admitting: Orthopaedic Surgery

## 2017-04-23 ENCOUNTER — Ambulatory Visit (INDEPENDENT_AMBULATORY_CARE_PROVIDER_SITE_OTHER): Payer: Medicare Other | Admitting: Orthopaedic Surgery

## 2017-04-23 VITALS — BP 112/70 | HR 90 | Ht 65.0 in | Wt 160.0 lb

## 2017-04-23 DIAGNOSIS — M79604 Pain in right leg: Secondary | ICD-10-CM

## 2017-04-23 DIAGNOSIS — M7989 Other specified soft tissue disorders: Secondary | ICD-10-CM

## 2017-04-23 NOTE — Progress Notes (Deleted)
Office Visit Note   Patient: [name]           Date of Birth: 11-May-1961           MRN: [id number] Visit Date: 04/23/2017              Requested by: Elwyn Reach, MD [place], [place] [address] PCP: Elwyn Reach, MD   Assessment & Plan: Visit Diagnoses: No diagnosis found.  Plan: ***  Follow-Up Instructions: No Follow-up on file.   Orders:  No orders of the defined types were placed in this encounter.  No orders of the defined types were placed in this encounter.     Procedures: No procedures performed   Clinical Data: No additional findings.   Subjective: Chief Complaint  Patient presents with  . Right Leg - Pain    HPI  Review of Systems   Objective: Vital Signs: BP 112/70   Pulse 90   Ht 5\' 5"  (1.651 m)   Wt 160 lb (72.6 kg)   BMI 26.63 kg/m   Physical Exam  Ortho Exam  Specialty Comments:  No specialty comments available.  Imaging: No results found.   PMFS History: Patient Active Problem List   Diagnosis Date Noted  . Unilateral primary osteoarthritis, right knee 03/28/2017  . Acute pain of right knee 01/13/2017  . Left genital labial abscess 10/17/2016  . Labial abscess 10/17/2016  . Anxiety state 03/15/2015  . Anemia 03/14/2015  . COPD (chronic obstructive pulmonary disease) ([place]) 03/13/2015  . Ileus ([place]) 04/18/2014  . Tobacco abuse 04/18/2014  . Hx of adenomatous colonic polyps 09/07/2013  . Muscle spasms of neck 06/09/2013  . Cervical pain 03/17/2013  . C. difficile colitis 06/25/2011  . Chronic nausea 03/13/2011  . DEPRESSION 05/09/2007  . ALLERGIC RHINITIS 05/09/2007  . ASTHMA 05/09/2007  . GERD 05/09/2007  . PEPTIC ULCER DISEASE 05/09/2007  . IBS 05/09/2007  . ARTHRITIS 05/09/2007  . HIP PAIN, LEFT 05/09/2007  . NECK PAIN, CHRONIC 05/09/2007  . LOW BACK PAIN 05/09/2007  . CONSTIPATION, HX OF 05/09/2007  . HX, PERSONAL, PAST NONCOMPLIANCE 05/09/2007   Past Medical History:    Diagnosis Date  . ADHD (attention deficit hyperactivity disorder)   . Anginal pain ([place])   . Asthma   . BMI (body mass index) 20.0-29.9 2009 127 LBS  . Chronic abdominal pain 2003   EX LAP APR 2004 RUPTURE L OV CYST, JUL 2004 ADHESIONS  . Chronic nausea   . Clostridium difficile colitis 04/10/11 &11/2011   tx w/ flagyl  . COPD (chronic obstructive pulmonary disease) ([place])   . Degenerative disc disease   . Hiatal hernia 2008   on EGD above  . Irritable bowel syndrome 2004 CONSTIPATION  . Migraines   . MRSA infection    Dr. [name], currently under treatment  . PTSD (post-traumatic stress disorder)   . PUD (peptic ulcer disease) 01/2007   EGD Dr [name], 2 antral ulcers, negative h pylori  . Rectal prolapse   . S/P colonoscopy 06/02/09   normal ([name])  . S/P endoscopy 03/26/11   gastritis-Dr [name]  . SBO (small bowel obstruction) ([facility])     Family History  Problem Relation Age of Onset  . Adopted: Yes    Past Surgical History:  Procedure Laterality Date  . ABDOMINAL HYSTERECTOMY    . APPENDECTOMY    . BACK SURGERY    . BOWEL RESECTION     x2, secondary to adhesions  .  CERVICAL SPINE SURGERY    . COLONOSCOPY  03/21/2012    YJE:HUDJSH ADENOMA(1) POOR PREP  . COLONOSCOPY N/A 11/17/2013   FWY:OVZCHY mucosa in the terminal ileum/NORMAL surgical anastomosis/Small internal hemorrhoids  . ESOPHAGOGASTRODUODENOSCOPY  03/23/2011    Normal esophagus without evidence of Barrett's, mass, erosions, or ulcerations./ Patchy erythema with occasional erosion in the antrum.  Biopsies  obtained via cold forceps to evaluate for H. pylori gastritis/ Small hiatal hernia./Normal duodenal bulb and second portion of the duodenum.  . ESOPHAGOGASTRODUODENOSCOPY  03/21/2012   IFO:YDXAJ Hiatal hernia/ABDOMINALPAIN/DIARRHEA MOST LIKELY DUE TO IBS, GASTRITIS DUODENITIS  . FLEXIBLE SIGMOIDOSCOPY  07/14/2002   Normal limited flexible sigmoidoscopy with stool in the rectum and rectosigmoid  precluding a full colonoscopy  . LAPAROSCOPIC LYSIS INTESTINAL ADHESIONS    . NECK SURGERY  2009  . PARTIAL HYSTERECTOMY    . TUBAL LIGATION    . UMBILICAL HERNIA REPAIR  2008   x5  . UPPER GASTROINTESTINAL ENDOSCOPY  APR 2008 RMR BLEEDING/PAIN   PUD  . UPPER GASTROINTESTINAL ENDOSCOPY  JUL 2012 SLF PAIN   MILD GASTRITIS   Social History   Occupational History  . disabled    Social History Main Topics  . Smoking status: Current Every Day Smoker    Packs/day: 0.50    Years: 10.00    Types: Cigarettes  . Smokeless tobacco: Former Systems developer  . Alcohol use No  . Drug use: No  . Sexual activity: No

## 2017-04-24 ENCOUNTER — Other Ambulatory Visit (INDEPENDENT_AMBULATORY_CARE_PROVIDER_SITE_OTHER): Payer: Self-pay | Admitting: Orthopaedic Surgery

## 2017-04-24 DIAGNOSIS — M1711 Unilateral primary osteoarthritis, right knee: Secondary | ICD-10-CM

## 2017-04-25 NOTE — Pre-Procedure Instructions (Signed)
[name]  04/25/2017      [facility] - [place], [place] - [address] AT [place] [address] Phone: [phone] Fax: [phone] - [place], [address] [address] Phone: [phone] Fax: [phone]  [place], [place]  [address] Phone: [phone] Fax: [phone]  [id number] - [place], [place] [address] Phone: [phone] Fax: [phone]  [place], [place], [place] [address] [place] [address] Phone: [phone] Fax: [phone]    Your procedure is scheduled on July 30   Report to [facility] at 1030 A.M.  Call this number if you have problems the morning of surgery:  [phone]   Remember:  Do not eat food or drink liquids after midnight.   Take these medicines the morning of surgery with A SIP OF WATER albuterol (PROVENTIL) (2.5 MG/3ML) nebulizer if needed, albuterol (PROVENTIL,VENTOLIN) inhaler if needed, cyclobenzaprine (FLEXERIL), DULoxetine (CYMBALTA), gabapentin (NEURONTIN), LORazepam (ATIVAN) ,    7 days prior to surgery STOP taking any Aspirin, Aleve, Naproxen, Ibuprofen, Motrin, Advil, Goody's, BC's, all herbal medications, fish oil, and all vitamins    Do not wear jewelry, make-up or nail polish.  Do not wear lotions, powders, or perfumes, or deoderant.  Do not shave 48 hours prior to surgery.    Do not bring valuables to the hospital.  [facility] is not responsible for any belongings or valuables.  Contacts, dentures or bridgework may not be worn into surgery.  Leave your suitcase in the car.  After surgery it may be brought to your room.  For patients admitted to the hospital, discharge  time will be determined by your treatment team.  Patients discharged the day of surgery will not be allowed to drive home.    Special instructions:   [facility]- Preparing For Surgery  Before surgery, you can play an important role. Because skin is not sterile, your skin needs to be as free of germs as possible. You can reduce the number of germs on your skin by washing with CHG (chlorahexidine gluconate) Soap before surgery.  CHG is an antiseptic cleaner which kills germs and bonds with the skin to continue killing germs even after washing.  Please do not use if you have an allergy to CHG or antibacterial soaps. If your skin becomes reddened/irritated stop using the CHG.  Do not shave (including legs and underarms) for at least 48 hours prior to first CHG shower. It is OK to shave your face.  Please follow these instructions carefully.   1. Shower the NIGHT BEFORE SURGERY and the MORNING OF SURGERY with CHG.   2. If you chose to wash your hair, wash your hair first as usual with your normal shampoo.  3. After you shampoo, rinse your hair and body thoroughly to remove the shampoo.  4. Use CHG as you would any other liquid soap. You can apply CHG directly to the skin and wash gently with a scrungie or a clean washcloth.   5. Apply the CHG Soap to your body ONLY FROM THE NECK DOWN.  Do not use on open  wounds or open sores. Avoid contact with your eyes, ears, mouth and genitals (private parts). Wash genitals (private parts) with your normal soap.  6. Wash thoroughly, paying special attention to the area where your surgery will be performed.  7. Thoroughly rinse your body with warm water from the neck down.  8. DO NOT shower/wash with your normal soap after using and rinsing off the CHG Soap.  9. Pat yourself dry with a CLEAN TOWEL.   10. Wear CLEAN PAJAMAS   11. Place CLEAN SHEETS on your bed the night of your first shower and DO NOT SLEEP WITH PETS.    Day of Surgery: Do not  apply any deodorants/lotions. Please wear clean clothes to the hospital/surgery center.      Please read over the following fact sheets that you were given.

## 2017-04-26 ENCOUNTER — Inpatient Hospital Stay (HOSPITAL_COMMUNITY)
Admission: RE | Admit: 2017-04-26 | Discharge: 2017-04-26 | Disposition: A | Discharge status: Home / Self Care | Payer: Medicare Other | Source: Ambulatory Visit

## 2017-04-28 NOTE — Progress Notes (Signed)
Post-Op Visit Note   Patient: [name]           Date of Birth: Mar 04, 1961           MRN: [id number] Visit Date: 04/23/2017 PCP: Elwyn Reach, MD   Assessment & Plan: Patient returns post total knee arthroplasty 05/06/17. She's had increased pain and swelling in her knee down to her ankle. She does some tingling in her toes particularly the big toe difficulty walking. She has been making satisfactory progress with the physical therapy.  Chief Complaint:  Chief Complaint  Patient presents with  . Right Leg - Pain   Visit Diagnoses:  1. Swelling of right lower extremity   2. Pain in right leg     Plan: We will obtain Doppler to rule out DVT. Continue therapy for strengthening and knee range of motion.  Follow-Up Instructions: No Follow-up on file.   Orders:  Orders Placed This Encounter  Procedures  . XR Lumbar Spine 2-3 Views   No orders of the defined types were placed in this encounter.   Imaging: No results found.  PMFS History: Patient Active Problem List   Diagnosis Date Noted  . Swelling of right lower extremity 04/23/2017  . Unilateral primary osteoarthritis, right knee 03/28/2017  . Acute pain of right knee 01/13/2017  . Left genital labial abscess 10/17/2016  . Labial abscess 10/17/2016  . Anxiety state 03/15/2015  . Anemia 03/14/2015  . COPD (chronic obstructive pulmonary disease) ([place]) 03/13/2015  . Ileus ([place]) 04/18/2014  . Tobacco abuse 04/18/2014  . Hx of adenomatous colonic polyps 09/07/2013  . Muscle spasms of neck 06/09/2013  . Cervical pain 03/17/2013  . C. difficile colitis 06/25/2011  . Chronic nausea 03/13/2011  . DEPRESSION 05/09/2007  . ALLERGIC RHINITIS 05/09/2007  . ASTHMA 05/09/2007  . GERD 05/09/2007  . PEPTIC ULCER DISEASE 05/09/2007  . IBS 05/09/2007  . ARTHRITIS 05/09/2007  . HIP PAIN, LEFT 05/09/2007  . NECK PAIN, CHRONIC 05/09/2007  . LOW BACK PAIN 05/09/2007  . CONSTIPATION, HX OF 05/09/2007  . HX,  PERSONAL, PAST NONCOMPLIANCE 05/09/2007   Past Medical History:  Diagnosis Date  . ADHD (attention deficit hyperactivity disorder)   . Anginal pain ([place])   . Asthma   . BMI (body mass index) 20.0-29.9 2009 127 LBS  . Chronic abdominal pain 2003   EX LAP APR 2004 RUPTURE L OV CYST, JUL 2004 ADHESIONS  . Chronic nausea   . Clostridium difficile colitis 04/10/11 &11/2011   tx w/ flagyl  . COPD (chronic obstructive pulmonary disease) ([place])   . Degenerative disc disease   . Hiatal hernia 2008   on EGD above  . Irritable bowel syndrome 2004 CONSTIPATION  . Migraines   . MRSA infection    Dr. [name], currently under treatment  . PTSD (post-traumatic stress disorder)   . PUD (peptic ulcer disease) 01/2007   EGD Dr [name], 2 antral ulcers, negative h pylori  . Rectal prolapse   . S/P colonoscopy 06/02/09   normal ([name])  . S/P endoscopy 03/26/11   gastritis-Dr [name]  . SBO (small bowel obstruction) ([facility])     Family History  Problem Relation Age of Onset  . Adopted: Yes    Past Surgical History:  Procedure Laterality Date  . ABDOMINAL HYSTERECTOMY    . APPENDECTOMY    . BACK SURGERY    . BOWEL RESECTION     x2, secondary to adhesions  . CERVICAL SPINE SURGERY    .  COLONOSCOPY  03/21/2012    UYZ:JQDUKR ADENOMA(1) POOR PREP  . COLONOSCOPY N/A 11/17/2013   CVK:FMMCRF mucosa in the terminal ileum/NORMAL surgical anastomosis/Small internal hemorrhoids  . ESOPHAGOGASTRODUODENOSCOPY  03/23/2011    Normal esophagus without evidence of Barrett's, mass, erosions, or ulcerations./ Patchy erythema with occasional erosion in the antrum.  Biopsies  obtained via cold forceps to evaluate for H. pylori gastritis/ Small hiatal hernia./Normal duodenal bulb and second portion of the duodenum.  . ESOPHAGOGASTRODUODENOSCOPY  03/21/2012   VOH:KGOVP Hiatal hernia/ABDOMINALPAIN/DIARRHEA MOST LIKELY DUE TO IBS, GASTRITIS DUODENITIS  . FLEXIBLE SIGMOIDOSCOPY  07/14/2002   Normal limited  flexible sigmoidoscopy with stool in the rectum and rectosigmoid precluding a full colonoscopy  . LAPAROSCOPIC LYSIS INTESTINAL ADHESIONS    . NECK SURGERY  2009  . PARTIAL HYSTERECTOMY    . TUBAL LIGATION    . UMBILICAL HERNIA REPAIR  2008   x5  . UPPER GASTROINTESTINAL ENDOSCOPY  APR 2008 RMR BLEEDING/PAIN   PUD  . UPPER GASTROINTESTINAL ENDOSCOPY  JUL 2012 SLF PAIN   MILD GASTRITIS   Social History   Occupational History  . disabled    Social History Main Topics  . Smoking status: Current Every Day Smoker    Packs/day: 0.50    Years: 10.00    Types: Cigarettes  . Smokeless tobacco: Former Systems developer  . Alcohol use No  . Drug use: No  . Sexual activity: No

## 2017-04-30 ENCOUNTER — Encounter (HOSPITAL_COMMUNITY): Payer: Self-pay

## 2017-04-30 ENCOUNTER — Encounter (HOSPITAL_COMMUNITY)
Admission: RE | Admit: 2017-04-30 | Discharge: 2017-04-30 | Disposition: A | Discharge status: Home / Self Care | Payer: Medicare Other | Source: Ambulatory Visit | Attending: Orthopaedic Surgery | Admitting: Orthopaedic Surgery

## 2017-04-30 ENCOUNTER — Ambulatory Visit (HOSPITAL_COMMUNITY)
Admission: RE | Admit: 2017-04-30 | Discharge: 2017-04-30 | Disposition: A | Discharge status: Home / Self Care | Payer: Medicare Other | Source: Ambulatory Visit | Attending: Orthopaedic Surgery | Admitting: Orthopaedic Surgery

## 2017-04-30 DIAGNOSIS — K449 Diaphragmatic hernia without obstruction or gangrene: Secondary | ICD-10-CM | POA: Diagnosis not present

## 2017-04-30 DIAGNOSIS — Z01810 Encounter for preprocedural cardiovascular examination: Secondary | ICD-10-CM | POA: Insufficient documentation

## 2017-04-30 DIAGNOSIS — M179 Osteoarthritis of knee, unspecified: Secondary | ICD-10-CM

## 2017-04-30 DIAGNOSIS — J9811 Atelectasis: Secondary | ICD-10-CM | POA: Insufficient documentation

## 2017-04-30 DIAGNOSIS — Z01812 Encounter for preprocedural laboratory examination: Secondary | ICD-10-CM | POA: Diagnosis present

## 2017-04-30 DIAGNOSIS — J984 Other disorders of lung: Secondary | ICD-10-CM | POA: Diagnosis not present

## 2017-04-30 DIAGNOSIS — M1710 Unilateral primary osteoarthritis, unspecified knee: Secondary | ICD-10-CM

## 2017-04-30 DIAGNOSIS — M1711 Unilateral primary osteoarthritis, right knee: Secondary | ICD-10-CM | POA: Diagnosis not present

## 2017-04-30 HISTORY — DX: Gastro-esophageal reflux disease without esophagitis: K21.9

## 2017-04-30 LAB — URINALYSIS, ROUTINE W REFLEX MICROSCOPIC
BILIRUBIN URINE: NEGATIVE
GLUCOSE, UA: NEGATIVE mg/dL
Ketones, ur: NEGATIVE mg/dL
NITRITE: NEGATIVE
PH: 5 (ref 5.0–8.0)
Protein, ur: NEGATIVE mg/dL
SPECIFIC GRAVITY, URINE: 1.024 (ref 1.005–1.030)

## 2017-04-30 LAB — COMPREHENSIVE METABOLIC PANEL
ALK PHOS: 82 U/L (ref 38–126)
ALT: 16 U/L (ref 14–54)
ANION GAP: 9 (ref 5–15)
AST: 22 U/L (ref 15–41)
Albumin: 4.1 g/dL (ref 3.5–5.0)
BILIRUBIN TOTAL: 0.4 mg/dL (ref 0.3–1.2)
BUN: 17 mg/dL (ref 6–20)
CALCIUM: 9.4 mg/dL (ref 8.9–10.3)
CO2: 23 mmol/L (ref 22–32)
CREATININE: 0.77 mg/dL (ref 0.44–1.00)
Chloride: 105 mmol/L (ref 101–111)
GFR calc Af Amer: >60 mL/min (ref >60)
GFR calc non Af Amer: >60 mL/min (ref >60)
Glucose, Bld: 102 mg/dL — ABNORMAL HIGH (ref 65–99)
Potassium: 3.7 mmol/L (ref 3.5–5.1)
SODIUM: 137 mmol/L (ref 135–145)
TOTAL PROTEIN: 6.6 g/dL (ref 6.5–8.1)

## 2017-04-30 LAB — SURGICAL PCR SCREEN
MRSA, PCR: NEGATIVE
STAPHYLOCOCCUS AUREUS: NEGATIVE

## 2017-04-30 LAB — CBC
HCT: 40.2 % (ref 36.0–46.0)
HEMOGLOBIN: 12.9 g/dL (ref 12.0–15.0)
MCH: 29.4 pg (ref 26.0–34.0)
MCHC: 32.1 g/dL (ref 30.0–36.0)
MCV: 91.6 fL (ref 78.0–100.0)
Platelets: 409 10*3/uL — ABNORMAL HIGH (ref 150–400)
RBC: 4.39 MIL/uL (ref 3.87–5.11)
RDW: 14.4 % (ref 11.5–15.5)
WBC: 11.8 10*3/uL — ABNORMAL HIGH (ref 4.0–10.5)

## 2017-04-30 NOTE — Progress Notes (Signed)
PCP: Dr. [name]  Cardiologist: pt denies  EKG: pt denies past year  Stress test: 7+ years ago  ECHO: pt denies ever  Cardiac Cath: pt denies ever  Chest x-ray: 10/18/16

## 2017-05-03 MED ORDER — CLINDAMYCIN PHOSPHATE 900 MG/50ML IV SOLN
900.0000 mg | INTRAVENOUS | Status: DC
  Filled 2017-05-03: qty 50

## 2017-05-06 ENCOUNTER — Encounter (HOSPITAL_COMMUNITY): Payer: Self-pay | Admitting: Anesthesiology

## 2017-05-06 ENCOUNTER — Inpatient Hospital Stay (HOSPITAL_COMMUNITY): Payer: Medicare Other | Admitting: Anesthesiology

## 2017-05-06 ENCOUNTER — Inpatient Hospital Stay (HOSPITAL_COMMUNITY)
Admission: RE | Admit: 2017-05-06 | Discharge: 2017-05-09 | DRG: 470 | Disposition: A | Discharge status: Skilled Nursing Facility | Payer: Medicare Other | Source: Ambulatory Visit | Attending: Orthopaedic Surgery | Admitting: Orthopaedic Surgery

## 2017-05-06 ENCOUNTER — Inpatient Hospital Stay (HOSPITAL_COMMUNITY): Payer: Medicare Other

## 2017-05-06 ENCOUNTER — Encounter (HOSPITAL_COMMUNITY)
Admission: RE | Disposition: A | Discharge status: Skilled Nursing Facility | Payer: Self-pay | Source: Ambulatory Visit | Attending: Orthopaedic Surgery

## 2017-05-06 DIAGNOSIS — F17210 Nicotine dependence, cigarettes, uncomplicated: Secondary | ICD-10-CM | POA: Diagnosis present

## 2017-05-06 DIAGNOSIS — M1711 Unilateral primary osteoarthritis, right knee: Secondary | ICD-10-CM | POA: Diagnosis present

## 2017-05-06 DIAGNOSIS — Z880 Allergy status to penicillin: Secondary | ICD-10-CM

## 2017-05-06 DIAGNOSIS — Z8711 Personal history of peptic ulcer disease: Secondary | ICD-10-CM | POA: Diagnosis not present

## 2017-05-06 DIAGNOSIS — Z882 Allergy status to sulfonamides status: Secondary | ICD-10-CM

## 2017-05-06 DIAGNOSIS — Z87892 Personal history of anaphylaxis: Secondary | ICD-10-CM

## 2017-05-06 DIAGNOSIS — J449 Chronic obstructive pulmonary disease, unspecified: Secondary | ICD-10-CM | POA: Diagnosis present

## 2017-05-06 DIAGNOSIS — K449 Diaphragmatic hernia without obstruction or gangrene: Secondary | ICD-10-CM | POA: Diagnosis present

## 2017-05-06 DIAGNOSIS — I209 Angina pectoris, unspecified: Secondary | ICD-10-CM | POA: Diagnosis present

## 2017-05-06 DIAGNOSIS — Z885 Allergy status to narcotic agent status: Secondary | ICD-10-CM | POA: Diagnosis not present

## 2017-05-06 DIAGNOSIS — Z888 Allergy status to other drugs, medicaments and biological substances status: Secondary | ICD-10-CM | POA: Diagnosis not present

## 2017-05-06 DIAGNOSIS — M660 Rupture of popliteal cyst: Secondary | ICD-10-CM | POA: Diagnosis present

## 2017-05-06 DIAGNOSIS — F329 Major depressive disorder, single episode, unspecified: Secondary | ICD-10-CM | POA: Diagnosis present

## 2017-05-06 DIAGNOSIS — F4310 Post-traumatic stress disorder, unspecified: Secondary | ICD-10-CM | POA: Diagnosis present

## 2017-05-06 DIAGNOSIS — Z09 Encounter for follow-up examination after completed treatment for conditions other than malignant neoplasm: Secondary | ICD-10-CM

## 2017-05-06 DIAGNOSIS — Z886 Allergy status to analgesic agent status: Secondary | ICD-10-CM | POA: Diagnosis not present

## 2017-05-06 DIAGNOSIS — K219 Gastro-esophageal reflux disease without esophagitis: Secondary | ICD-10-CM | POA: Diagnosis present

## 2017-05-06 DIAGNOSIS — Z8614 Personal history of Methicillin resistant Staphylococcus aureus infection: Secondary | ICD-10-CM

## 2017-05-06 DIAGNOSIS — E739 Lactose intolerance, unspecified: Secondary | ICD-10-CM | POA: Diagnosis present

## 2017-05-06 DIAGNOSIS — F411 Generalized anxiety disorder: Secondary | ICD-10-CM | POA: Diagnosis present

## 2017-05-06 HISTORY — DX: Depression, unspecified: F32.A

## 2017-05-06 HISTORY — PX: TOTAL KNEE ARTHROPLASTY: SHX125

## 2017-05-06 HISTORY — DX: Major depressive disorder, single episode, unspecified: F32.9

## 2017-05-06 SURGERY — ARTHROPLASTY, KNEE, TOTAL
Anesthesia: General | Site: Knee | Laterality: Right

## 2017-05-06 MED ORDER — FENTANYL CITRATE (PF) 100 MCG/2ML IJ SOLN
50.0000 ug | Freq: Once | INTRAMUSCULAR | Status: AC
  Administered 2017-05-06: 50 ug via INTRAVENOUS

## 2017-05-06 MED ORDER — ONDANSETRON HCL 4 MG PO TABS: 4.0000 mg | ORAL_TABLET | Freq: Four times a day (QID) | ORAL | Status: DC | PRN

## 2017-05-06 MED ORDER — LORAZEPAM 1 MG PO TABS
1.0000 mg | ORAL_TABLET | Freq: Three times a day (TID) | ORAL | Status: DC | PRN
  Administered 2017-05-06 – 2017-05-09 (×5): 1 mg via ORAL
  Filled 2017-05-06 (×5): qty 1

## 2017-05-06 MED ORDER — DOCUSATE SODIUM 100 MG PO CAPS
100.0000 mg | ORAL_CAPSULE | Freq: Two times a day (BID) | ORAL | Status: DC
  Administered 2017-05-06 – 2017-05-09 (×6): 100 mg via ORAL
  Filled 2017-05-06 (×6): qty 1

## 2017-05-06 MED ORDER — ALBUTEROL 90 MCG/ACT IN AERS: 2.0000 | INHALATION_SPRAY | Freq: Four times a day (QID) | RESPIRATORY_TRACT | Status: DC | PRN

## 2017-05-06 MED ORDER — MIRTAZAPINE 15 MG PO TABS
15.0000 mg | ORAL_TABLET | Freq: Every evening | ORAL | Status: DC | PRN
  Administered 2017-05-06: 15 mg via ORAL
  Filled 2017-05-06 (×2): qty 1

## 2017-05-06 MED ORDER — ACETAMINOPHEN 325 MG PO TABS
650.0000 mg | ORAL_TABLET | Freq: Four times a day (QID) | ORAL | Status: DC | PRN
  Administered 2017-05-07 – 2017-05-09 (×3): 650 mg via ORAL
  Filled 2017-05-06 (×3): qty 2

## 2017-05-06 MED ORDER — PROPOFOL 10 MG/ML IV BOLUS
INTRAVENOUS | Status: DC | PRN
  Administered 2017-05-06: 130 mg via INTRAVENOUS

## 2017-05-06 MED ORDER — DULOXETINE HCL 60 MG PO CPEP
120.0000 mg | ORAL_CAPSULE | Freq: Every day | ORAL | Status: DC
  Administered 2017-05-07 – 2017-05-09 (×3): 120 mg via ORAL
  Filled 2017-05-06 (×3): qty 2

## 2017-05-06 MED ORDER — LIDOCAINE 2% (20 MG/ML) 5 ML SYRINGE
INTRAMUSCULAR | Status: AC
  Filled 2017-05-06: qty 10

## 2017-05-06 MED ORDER — PROMETHAZINE HCL 25 MG/ML IJ SOLN: 6.2500 mg | INTRAMUSCULAR | Status: DC | PRN

## 2017-05-06 MED ORDER — SODIUM CHLORIDE 0.9 % IR SOLN
Status: DC | PRN
  Administered 2017-05-06: 1000 mL

## 2017-05-06 MED ORDER — FENTANYL CITRATE (PF) 100 MCG/2ML IJ SOLN
100.0000 ug | Freq: Once | INTRAMUSCULAR | Status: AC
  Administered 2017-05-08: 100 ug via INTRAVENOUS
  Filled 2017-05-06 (×2): qty 2

## 2017-05-06 MED ORDER — MIDAZOLAM HCL 2 MG/2ML IJ SOLN
2.0000 mg | Freq: Once | INTRAMUSCULAR | Status: AC
  Administered 2017-05-06: 2 mg via INTRAVENOUS

## 2017-05-06 MED ORDER — PROPOFOL 10 MG/ML IV BOLUS
INTRAVENOUS | Status: AC
  Filled 2017-05-06: qty 20

## 2017-05-06 MED ORDER — MENTHOL 3 MG MT LOZG: 1.0000 | LOZENGE | OROMUCOSAL | Status: DC | PRN

## 2017-05-06 MED ORDER — POLYETHYLENE GLYCOL 3350 17 G PO PACK
17.0000 g | PACK | Freq: Every day | ORAL | Status: DC | PRN
  Administered 2017-05-09: 17 g via ORAL
  Filled 2017-05-06: qty 1

## 2017-05-06 MED ORDER — SUGAMMADEX SODIUM 500 MG/5ML IV SOLN
INTRAVENOUS | Status: AC
  Filled 2017-05-06: qty 5

## 2017-05-06 MED ORDER — HYDROMORPHONE HCL 1 MG/ML IJ SOLN
0.2500 mg | INTRAMUSCULAR | Status: DC | PRN
  Administered 2017-05-06 (×2): 0.5 mg via INTRAVENOUS

## 2017-05-06 MED ORDER — LACTATED RINGERS IV SOLN: INTRAVENOUS | Status: DC

## 2017-05-06 MED ORDER — LACTATED RINGERS IV SOLN
INTRAVENOUS | Status: DC
  Administered 2017-05-06: 12:00:00 via INTRAVENOUS

## 2017-05-06 MED ORDER — LIDOCAINE HCL (CARDIAC) 20 MG/ML IV SOLN
INTRAVENOUS | Status: DC | PRN
  Administered 2017-05-06: 40 mg via INTRAVENOUS
  Administered 2017-05-06: 20 mg via INTRAVENOUS

## 2017-05-06 MED ORDER — OXYCODONE HCL 5 MG PO TABS
5.0000 mg | ORAL_TABLET | ORAL | Status: DC | PRN
  Administered 2017-05-06: 10 mg via ORAL
  Administered 2017-05-06: 5 mg via ORAL
  Administered 2017-05-07 – 2017-05-09 (×12): 10 mg via ORAL
  Filled 2017-05-06 (×13): qty 2

## 2017-05-06 MED ORDER — ONDANSETRON HCL 4 MG/2ML IJ SOLN
INTRAMUSCULAR | Status: DC | PRN
  Administered 2017-05-06: 4 mg via INTRAVENOUS

## 2017-05-06 MED ORDER — ROPIVACAINE HCL 5 MG/ML IJ SOLN
INTRAMUSCULAR | Status: DC | PRN
  Administered 2017-05-06: 30 mL via PERINEURAL

## 2017-05-06 MED ORDER — VANCOMYCIN HCL IN DEXTROSE 1-5 GM/200ML-% IV SOLN
INTRAVENOUS | Status: AC
  Filled 2017-05-06: qty 200

## 2017-05-06 MED ORDER — METOCLOPRAMIDE HCL 5 MG PO TABS: 5.0000 mg | ORAL_TABLET | Freq: Three times a day (TID) | ORAL | Status: DC | PRN

## 2017-05-06 MED ORDER — PHENOL 1.4 % MT LIQD: 1.0000 | OROMUCOSAL | Status: DC | PRN

## 2017-05-06 MED ORDER — PHENYLEPHRINE 40 MCG/ML (10ML) SYRINGE FOR IV PUSH (FOR BLOOD PRESSURE SUPPORT)
PREFILLED_SYRINGE | INTRAVENOUS | Status: AC
  Filled 2017-05-06: qty 20

## 2017-05-06 MED ORDER — METOCLOPRAMIDE HCL 5 MG/ML IJ SOLN: 5.0000 mg | Freq: Three times a day (TID) | INTRAMUSCULAR | Status: DC | PRN

## 2017-05-06 MED ORDER — BUPIVACAINE HCL (PF) 0.25 % IJ SOLN
INTRAMUSCULAR | Status: DC | PRN
  Administered 2017-05-06: 20 mL

## 2017-05-06 MED ORDER — ENOXAPARIN SODIUM 30 MG/0.3ML ~~LOC~~ SOLN
30.0000 mg | Freq: Two times a day (BID) | SUBCUTANEOUS | Status: DC
  Administered 2017-05-07 – 2017-05-09 (×5): 30 mg via SUBCUTANEOUS
  Filled 2017-05-06 (×5): qty 0.3

## 2017-05-06 MED ORDER — ALBUTEROL SULFATE (2.5 MG/3ML) 0.083% IN NEBU
2.5000 mg | INHALATION_SOLUTION | Freq: Four times a day (QID) | RESPIRATORY_TRACT | Status: DC | PRN
  Administered 2017-05-06 – 2017-05-07 (×3): 2.5 mg via RESPIRATORY_TRACT
  Filled 2017-05-06 (×3): qty 3

## 2017-05-06 MED ORDER — ACETAMINOPHEN 650 MG RE SUPP: 650.0000 mg | Freq: Four times a day (QID) | RECTAL | Status: DC | PRN

## 2017-05-06 MED ORDER — ONDANSETRON HCL 4 MG/2ML IJ SOLN
INTRAMUSCULAR | Status: AC
  Filled 2017-05-06: qty 2

## 2017-05-06 MED ORDER — BUPIVACAINE LIPOSOME 1.3 % IJ SUSP
20.0000 mL | Freq: Once | INTRAMUSCULAR | Status: DC
Start: 2017-05-06 — End: 2017-05-09
  Filled 2017-05-06: qty 20

## 2017-05-06 MED ORDER — ROCURONIUM BROMIDE 10 MG/ML (PF) SYRINGE
PREFILLED_SYRINGE | INTRAVENOUS | Status: AC
  Filled 2017-05-06: qty 10

## 2017-05-06 MED ORDER — ROCURONIUM BROMIDE 100 MG/10ML IV SOLN
INTRAVENOUS | Status: DC | PRN
  Administered 2017-05-06: 40 mg via INTRAVENOUS

## 2017-05-06 MED ORDER — VANCOMYCIN HCL IN DEXTROSE 1-5 GM/200ML-% IV SOLN
1000.0000 mg | Freq: Two times a day (BID) | INTRAVENOUS | Status: AC
  Administered 2017-05-07: 1000 mg via INTRAVENOUS
  Filled 2017-05-06: qty 200

## 2017-05-06 MED ORDER — SUCCINYLCHOLINE CHLORIDE 200 MG/10ML IV SOSY
PREFILLED_SYRINGE | INTRAVENOUS | Status: AC
  Filled 2017-05-06: qty 10

## 2017-05-06 MED ORDER — MIDAZOLAM HCL 2 MG/2ML IJ SOLN
2.0000 mg | Freq: Once | INTRAMUSCULAR | Status: DC
  Filled 2017-05-06 (×2): qty 2

## 2017-05-06 MED ORDER — ALBUTEROL SULFATE HFA 108 (90 BASE) MCG/ACT IN AERS
INHALATION_SPRAY | RESPIRATORY_TRACT | Status: DC | PRN
  Administered 2017-05-06 (×3): 6 via RESPIRATORY_TRACT

## 2017-05-06 MED ORDER — METHOCARBAMOL 500 MG PO TABS
ORAL_TABLET | ORAL | Status: AC
  Filled 2017-05-06: qty 1

## 2017-05-06 MED ORDER — SODIUM CHLORIDE 0.9 % IV SOLN
INTRAVENOUS | Status: DC
  Administered 2017-05-06: 18:00:00 via INTRAVENOUS

## 2017-05-06 MED ORDER — MEPERIDINE HCL 25 MG/ML IJ SOLN: 6.2500 mg | INTRAMUSCULAR | Status: DC | PRN

## 2017-05-06 MED ORDER — ALBUTEROL SULFATE (2.5 MG/3ML) 0.083% IN NEBU
INHALATION_SOLUTION | RESPIRATORY_TRACT | Status: AC
  Administered 2017-05-06: 2.5 mg via RESPIRATORY_TRACT
  Filled 2017-05-06: qty 3

## 2017-05-06 MED ORDER — HYDROMORPHONE HCL 1 MG/ML IJ SOLN
INTRAMUSCULAR | Status: AC
  Administered 2017-05-06: 0.5 mg via INTRAVENOUS
  Filled 2017-05-06: qty 1

## 2017-05-06 MED ORDER — ALBUTEROL SULFATE (2.5 MG/3ML) 0.083% IN NEBU
2.5000 mg | INHALATION_SOLUTION | Freq: Once | RESPIRATORY_TRACT | Status: AC
  Administered 2017-05-06: 2.5 mg via RESPIRATORY_TRACT
  Filled 2017-05-06: qty 3

## 2017-05-06 MED ORDER — METHOCARBAMOL 500 MG PO TABS
500.0000 mg | ORAL_TABLET | Freq: Four times a day (QID) | ORAL | Status: DC | PRN
  Administered 2017-05-06 – 2017-05-09 (×7): 500 mg via ORAL
  Filled 2017-05-06 (×5): qty 1

## 2017-05-06 MED ORDER — 0.9 % SODIUM CHLORIDE (POUR BTL) OPTIME
TOPICAL | Status: DC | PRN
  Administered 2017-05-06: 1000 mL

## 2017-05-06 MED ORDER — FENTANYL CITRATE (PF) 250 MCG/5ML IJ SOLN
INTRAMUSCULAR | Status: AC
  Filled 2017-05-06: qty 5

## 2017-05-06 MED ORDER — SUGAMMADEX SODIUM 200 MG/2ML IV SOLN
INTRAVENOUS | Status: DC | PRN
  Administered 2017-05-06: 150 mg via INTRAVENOUS

## 2017-05-06 MED ORDER — METHOCARBAMOL 1000 MG/10ML IJ SOLN: 500.0000 mg | Freq: Four times a day (QID) | INTRAVENOUS | Status: DC | PRN

## 2017-05-06 MED ORDER — PANTOPRAZOLE SODIUM 40 MG PO TBEC
40.0000 mg | DELAYED_RELEASE_TABLET | Freq: Every day | ORAL | Status: DC
Start: 2017-05-07 — End: 2017-05-09
  Administered 2017-05-07 – 2017-05-09 (×3): 40 mg via ORAL
  Filled 2017-05-06 (×3): qty 1

## 2017-05-06 MED ORDER — GABAPENTIN 300 MG PO CAPS
600.0000 mg | ORAL_CAPSULE | Freq: Every day | ORAL | Status: DC
  Administered 2017-05-06 – 2017-05-08 (×3): 600 mg via ORAL
  Filled 2017-05-06 (×3): qty 2

## 2017-05-06 MED ORDER — MIDAZOLAM HCL 2 MG/2ML IJ SOLN
INTRAMUSCULAR | Status: AC
  Filled 2017-05-06: qty 2

## 2017-05-06 MED ORDER — DEXAMETHASONE SODIUM PHOSPHATE 10 MG/ML IJ SOLN
INTRAMUSCULAR | Status: AC
  Filled 2017-05-06: qty 1

## 2017-05-06 MED ORDER — DEXAMETHASONE SODIUM PHOSPHATE 10 MG/ML IJ SOLN
INTRAMUSCULAR | Status: DC | PRN
  Administered 2017-05-06: 5 mg via INTRAVENOUS

## 2017-05-06 MED ORDER — VANCOMYCIN HCL IN DEXTROSE 1-5 GM/200ML-% IV SOLN
1000.0000 mg | Freq: Once | INTRAVENOUS | Status: AC
  Administered 2017-05-06: 1000 mg via INTRAVENOUS

## 2017-05-06 MED ORDER — ONDANSETRON HCL 4 MG/2ML IJ SOLN
4.0000 mg | Freq: Four times a day (QID) | INTRAMUSCULAR | Status: DC | PRN
  Administered 2017-05-07 – 2017-05-08 (×3): 4 mg via INTRAVENOUS
  Filled 2017-05-06 (×3): qty 2

## 2017-05-06 MED ORDER — GABAPENTIN 300 MG PO CAPS
300.0000 mg | ORAL_CAPSULE | Freq: Every morning | ORAL | Status: DC
  Administered 2017-05-07 – 2017-05-09 (×3): 300 mg via ORAL
  Filled 2017-05-06 (×3): qty 1

## 2017-05-06 MED ORDER — FENTANYL CITRATE (PF) 100 MCG/2ML IJ SOLN
INTRAMUSCULAR | Status: DC | PRN
  Administered 2017-05-06 (×2): 100 ug via INTRAVENOUS
  Administered 2017-05-06: 50 ug via INTRAVENOUS

## 2017-05-06 MED ORDER — BUPIVACAINE LIPOSOME 1.3 % IJ SUSP
INTRAMUSCULAR | Status: DC | PRN
  Administered 2017-05-06: 20 mL

## 2017-05-06 MED ORDER — OXYCODONE HCL 5 MG PO TABS
ORAL_TABLET | ORAL | Status: AC
  Filled 2017-05-06: qty 1

## 2017-05-06 MED ORDER — EPHEDRINE 5 MG/ML INJ
INTRAVENOUS | Status: AC
  Filled 2017-05-06: qty 10

## 2017-05-06 SURGICAL SUPPLY — 76 items
BANDAGE ACE 4X5 VEL STRL LF (GAUZE/BANDAGES/DRESSINGS) ×2 IMPLANT
BANDAGE ACE 6X5 VEL STRL LF (GAUZE/BANDAGES/DRESSINGS) ×2 IMPLANT
BANDAGE ESMARK 6X9 LF (GAUZE/BANDAGES/DRESSINGS) ×1 IMPLANT
BENZOIN TINCTURE PRP APPL 2/3 (GAUZE/BANDAGES/DRESSINGS) IMPLANT
BLADE SAGITTAL 25.0X1.19X90 (BLADE) ×2 IMPLANT
BLADE SAW SGTL 13X75X1.27 (BLADE) ×2 IMPLANT
BNDG ELASTIC 6X10 VLCR STRL LF (GAUZE/BANDAGES/DRESSINGS) ×2 IMPLANT
BNDG ESMARK 6X9 LF (GAUZE/BANDAGES/DRESSINGS) ×2
BOWL SMART MIX CTS (DISPOSABLE) ×2 IMPLANT
CAPT KNEE TOTAL 3 ATTUNE ×2 IMPLANT
CEMENT HV SMART SET (Cement) ×4 IMPLANT
CLOSURE STERI-STRIP 1/4X4 (GAUZE/BANDAGES/DRESSINGS) ×2 IMPLANT
COVER SURGICAL LIGHT HANDLE (MISCELLANEOUS) ×2 IMPLANT
CUFF TOURNIQUET SINGLE 34IN LL (TOURNIQUET CUFF) ×2 IMPLANT
CUFF TOURNIQUET SINGLE 44IN (TOURNIQUET CUFF) IMPLANT
DRAPE ORTHO SPLIT 77X108 STRL (DRAPES) ×2
DRAPE SURG ORHT 6 SPLT 77X108 (DRAPES) ×2 IMPLANT
DRAPE U-SHAPE 47X51 STRL (DRAPES) ×2 IMPLANT
DRSG EMULSION OIL 3X3 NADH (GAUZE/BANDAGES/DRESSINGS) ×2 IMPLANT
DRSG PAD ABDOMINAL 8X10 ST (GAUZE/BANDAGES/DRESSINGS) ×2 IMPLANT
DURAPREP 26ML APPLICATOR (WOUND CARE) ×4 IMPLANT
ELECT CAUTERY BLADE 6.4 (BLADE) ×2 IMPLANT
ELECT PENCIL ROCKER SW 15FT (MISCELLANEOUS) ×2 IMPLANT
ELECT REM PT RETURN 9FT ADLT (ELECTROSURGICAL) ×2
ELECTRODE REM PT RTRN 9FT ADLT (ELECTROSURGICAL) ×1 IMPLANT
EVACUATOR 1/8 PVC DRAIN (DRAIN) IMPLANT
FACESHIELD WRAPAROUND (MASK) ×4 IMPLANT
GAUZE SPONGE 4X4 12PLY STRL (GAUZE/BANDAGES/DRESSINGS) IMPLANT
GAUZE SPONGE 4X4 12PLY STRL LF (GAUZE/BANDAGES/DRESSINGS) ×2 IMPLANT
GAUZE XEROFORM 5X9 LF (GAUZE/BANDAGES/DRESSINGS) ×2 IMPLANT
GLOVE BIOGEL PI IND STRL 6.5 (GLOVE) ×1 IMPLANT
GLOVE BIOGEL PI IND STRL 7.0 (GLOVE) ×1 IMPLANT
GLOVE BIOGEL PI IND STRL 8 (GLOVE) ×2 IMPLANT
GLOVE BIOGEL PI INDICATOR 6.5 (GLOVE) ×1
GLOVE BIOGEL PI INDICATOR 7.0 (GLOVE) ×1
GLOVE BIOGEL PI INDICATOR 8 (GLOVE) ×2
GLOVE ORTHO TXT STRL SZ7.5 (GLOVE) ×4 IMPLANT
GOWN STRL REUS W/ TWL LRG LVL3 (GOWN DISPOSABLE) ×1 IMPLANT
GOWN STRL REUS W/ TWL XL LVL3 (GOWN DISPOSABLE) ×1 IMPLANT
GOWN STRL REUS W/TWL 2XL LVL3 (GOWN DISPOSABLE) ×4 IMPLANT
GOWN STRL REUS W/TWL LRG LVL3 (GOWN DISPOSABLE) ×1
GOWN STRL REUS W/TWL XL LVL3 (GOWN DISPOSABLE) ×1
HANDPIECE INTERPULSE COAX TIP (DISPOSABLE) ×1
IMMOBILIZER KNEE 22 (SOFTGOODS) ×2 IMPLANT
IMMOBILIZER KNEE 22 UNIV (SOFTGOODS) ×2 IMPLANT
KIT BASIN OR (CUSTOM PROCEDURE TRAY) ×2 IMPLANT
KIT ROOM TURNOVER OR (KITS) ×2 IMPLANT
MANIFOLD NEPTUNE II (INSTRUMENTS) ×2 IMPLANT
MARKER SKIN DUAL TIP RULER LAB (MISCELLANEOUS) ×2 IMPLANT
NEEDLE 18GX1X1/2 (RX/OR ONLY) (NEEDLE) ×2 IMPLANT
NEEDLE HYPO 25GX1X1/2 BEV (NEEDLE) ×2 IMPLANT
NEEDLE SPNL 18GX3.5 QUINCKE PK (NEEDLE) ×2 IMPLANT
NS IRRIG 1000ML POUR BTL (IV SOLUTION) ×2 IMPLANT
PACK TOTAL JOINT (CUSTOM PROCEDURE TRAY) ×2 IMPLANT
PAD ABD 8X10 STRL (GAUZE/BANDAGES/DRESSINGS) ×2 IMPLANT
PAD ARMBOARD 7.5X6 YLW CONV (MISCELLANEOUS) ×4 IMPLANT
PAD CAST 4YDX4 CTTN HI CHSV (CAST SUPPLIES) ×2 IMPLANT
PADDING CAST COTTON 4X4 STRL (CAST SUPPLIES) ×2
PADDING CAST COTTON 6X4 STRL (CAST SUPPLIES) ×2 IMPLANT
SET HNDPC FAN SPRY TIP SCT (DISPOSABLE) ×1 IMPLANT
STAPLER VISISTAT 35W (STAPLE) ×4 IMPLANT
STRIP CLOSURE SKIN 1/2X4 (GAUZE/BANDAGES/DRESSINGS) ×4 IMPLANT
SUCTION FRAZIER HANDLE 10FR (MISCELLANEOUS) ×1
SUCTION TUBE FRAZIER 10FR DISP (MISCELLANEOUS) ×1 IMPLANT
SUT VIC AB 0 CT1 27 (SUTURE) ×1
SUT VIC AB 0 CT1 27XBRD ANBCTR (SUTURE) ×1 IMPLANT
SUT VIC AB 1 CTX 36 (SUTURE) ×4
SUT VIC AB 1 CTX36XBRD ANBCTR (SUTURE) ×4 IMPLANT
SUT VIC AB 2-0 CT1 27 (SUTURE) ×2
SUT VIC AB 2-0 CT1 TAPERPNT 27 (SUTURE) ×2 IMPLANT
SUT VIC AB 3-0 X1 27 (SUTURE) ×2 IMPLANT
SYR 50ML LL SCALE MARK (SYRINGE) ×2 IMPLANT
SYR CONTROL 10ML LL (SYRINGE) ×2 IMPLANT
TOWEL OR 17X24 6PK STRL BLUE (TOWEL DISPOSABLE) ×2 IMPLANT
TOWEL OR 17X26 10 PK STRL BLUE (TOWEL DISPOSABLE) ×2 IMPLANT
TRAY CATH 16FR W/PLASTIC CATH (SET/KITS/TRAYS/PACK) IMPLANT

## 2017-05-06 NOTE — Progress Notes (Signed)
Waiting on Xray, prior to starting CPM

## 2017-05-06 NOTE — Anesthesia Preprocedure Evaluation (Addendum)
Anesthesia Evaluation  Patient identified by MRN, date of birth, ID band Patient awake    Reviewed: Allergy & Precautions, NPO status , Patient's Chart, lab work & pertinent test results  Airway Mallampati: I  TM Distance: >3 FB Neck ROM: Full    Dental  (+) Upper Dentures, Lower Dentures   Pulmonary asthma , COPD,  COPD inhaler, Current Smoker,     + decreased breath sounds      Cardiovascular + angina negative cardio ROS   Rhythm:Regular Rate:Normal     Neuro/Psych  Headaches, PSYCHIATRIC DISORDERS Anxiety Depression  Neuromuscular disease negative neurological ROS     GI/Hepatic Neg liver ROS, hiatal hernia, PUD, GERD  Medicated,  Endo/Other  negative endocrine ROS  Renal/GU negative Renal ROS     Musculoskeletal  (+) Arthritis , Osteoarthritis,    Abdominal   Peds  Hematology negative hematology ROS (+)   Anesthesia Other Findings Day of surgery medications reviewed with the patient.  - PTSD  Reproductive/Obstetrics                            Lab Results  Component Value Date   WBC 11.8 (H) 04/30/2017   HGB 12.9 04/30/2017   HCT 40.2 04/30/2017   MCV 91.6 04/30/2017   PLT 409 (H) 04/30/2017   Lab Results  Component Value Date   INR 0.92 07/30/2016   INR 0.83 11/11/2010   INR 1.0 10/02/2008   EKG: normal sinus rhythm  Anesthesia Physical Anesthesia Plan  ASA: III  Anesthesia Plan: General   Post-op Pain Management:  Regional for Post-op pain   Induction: Intravenous  PONV Risk Score and Plan: 2 and Ondansetron, Dexamethasone and Propofol infusion  Airway Management Planned: Oral ETT  Additional Equipment:   Intra-op Plan:   Post-operative Plan: Extubation in OR  Informed Consent: I have reviewed the patients History and Physical, chart, labs and discussed the procedure including the risks, benefits and alternatives for the proposed anesthesia with the patient  or authorized representative who has indicated his/her understanding and acceptance.     Plan Discussed with: CRNA  Anesthesia Plan Comments: (Pt declined spinal 2/2 h/o back surgery and continuous back issues. )      Anesthesia Quick Evaluation

## 2017-05-06 NOTE — Transfer of Care (Signed)
Immediate Anesthesia Transfer of Care Note  Patient: [name]  Procedure(s) Performed: Procedure(s): RIGHT TOTAL KNEE ARTHROPLASTY (Right)  Patient Location: PACU  Anesthesia Type:General  Level of Consciousness: awake, alert , oriented and patient cooperative  Airway & Oxygen Therapy: Patient Spontanous Breathing and Patient connected to face mask oxygen  Post-op Assessment: Report given to RN and Post -op Vital signs reviewed and stable  Post vital signs: Reviewed and stable  Last Vitals:  Vitals:   05/06/17 1230 05/06/17 1245  BP: 113/67 110/64  Pulse: 84   Resp: 16   Temp:      Last Pain:  Vitals:   05/06/17 1026  TempSrc: Oral         Complications: No apparent anesthesia complications

## 2017-05-06 NOTE — Op Note (Signed)
Preop diagnosis right knee osteoarthritis, primary  Postop diagnosis same  Procedure right cemented total knee arthroplasty.  Surgeon Rodell Perna M.D.  Anesthesia : general plus Marcaine and expirIL with abductor block  Asst. Benjiman Core PA-C medically necessary and present the entire procedure  Implants DePuy ATTUNE #3 femur #3 tibia +7 mm spacer and 71mm patella.  Tourniquet: 350 pressure times a 46 minutes.  Procedure after standard prepping draping proximal thigh tourniquet heel bump lateral post DuraPrep preoperative vancomycin due to her history of positive MRSA abscess in the past and recent negative PCR there is complete infusion of the vancomycin before prepping and draping the was finished. Foot was prepped and and usual impervious stockinette Caban sterile skin marker and Betadine Steri-Drape her use. Leg was wrapped an Esmarch tourniquet inflated. Midline incision was made. Medial parapatellar incision splitting the quad tendon. Patella was everted 10 mm resected. Intramedullary hole drilled up the femur 9 mm cut off the distal femur. Cut on the tibia was slightly more than 9 mm and with measurements a 7 trial spacer block gave excellent fit and full extension. Chamfer cuts on the femur box cuts trials preparation of the patella drilling the lug holes in the femur three-quarter osteotome to remove some posterior spurs on the posterior medial 4 femur. With resection meniscus there was pedunculated amount of synovium the came of the popliteal space which was resected and sent to pathology appeared to be a hemorrhagic popliteal cyst. This was not noted on the preoperative MRI. After pulse lavage vacuum mixing of the cement tibia cemented first followed by femur placement of 7 mm permanent spacer which gave full extension all excessive cement was removed and patello-held with a clamp. Tourniquet was deflated there was good hemostasis small bleeders controlled with the cautery. Standard closure  #1 Vicryl in the quad tendon and capsule 2-0 Vicryl subtendinous tissue subcuticular closure postop dressing and transferred recovery room.

## 2017-05-06 NOTE — Brief Op Note (Signed)
05/06/2017  3:27 PM  PATIENT:  [name]  56 y.o. female  PRE-OPERATIVE DIAGNOSIS:  Right Knee Osteoarthritis   POST-OPERATIVE DIAGNOSIS:  Right Knee Osteoarthritis   PROCEDURE:  Procedure(s): RIGHT TOTAL KNEE ARTHROPLASTY (Right)  SURGEON:  Surgeon(s) and Role:    * Marybelle Killings, MD - Primary  PHYSICIAN ASSISTANT: Benjiman Core pa-c    ANESTHESIA:   general  EBL:  Total I/O In: 800 [I.V.:800] Out: 50 [Blood:50]  BLOOD ADMINISTERED:none  DRAINS: none   LOCAL MEDICATIONS USED:  MARCAINE/exparel     SPECIMEN:  Intra-articular Soft tissue mass  DISPOSITION OF SPECIMEN:  PATHOLOGY  COUNTS:  YES  TOURNIQUET:   Total Tourniquet Time Documented: Thigh (Right) - 42 minutes Total: Thigh (Right) - 42 minutes   DICTATION: .Viviann Spare Dictation  PLAN OF CARE: Admit to inpatient   PATIENT DISPOSITION:  PACU - hemodynamically stable.

## 2017-05-06 NOTE — H&P (Signed)
TOTAL KNEE ADMISSION H&P  Patient is being admitted for right total knee arthroplasty.  Subjective:  Chief Complaint:right knee pain.  HPI: [name], 56 y.o. female, has a history of pain and functional disability in the right knee due to arthritis and has failed non-surgical conservative treatments for greater than 12 weeks to includeNSAID's and/or analgesics, use of assistive devices, weight reduction as appropriate and activity modification.  Onset of symptoms was gradual, starting 10 years ago with gradually worsening course since that time.  Patient currently rates pain in the right knee(s) at 10 out of 10 with activity. Patient has night pain, worsening of pain with activity and weight bearing, pain that interferes with activities of daily living, pain with passive range of motion, crepitus and joint swelling.  Patient has evidence of subchondral sclerosis, periarticular osteophytes and joint space narrowing by imaging studies.  There is no active infection.  Patient Active Problem List   Diagnosis Date Noted  . Swelling of right lower extremity 04/23/2017  . Unilateral primary osteoarthritis, right knee 03/28/2017  . Acute pain of right knee 01/13/2017  . Left genital labial abscess 10/17/2016  . Labial abscess 10/17/2016  . Anxiety state 03/15/2015  . Anemia 03/14/2015  . COPD (chronic obstructive pulmonary disease) ([place]) 03/13/2015  . Ileus ([place]) 04/18/2014  . Tobacco abuse 04/18/2014  . Hx of adenomatous colonic polyps 09/07/2013  . Muscle spasms of neck 06/09/2013  . Cervical pain 03/17/2013  . C. difficile colitis 06/25/2011  . Chronic nausea 03/13/2011  . DEPRESSION 05/09/2007  . ALLERGIC RHINITIS 05/09/2007  . ASTHMA 05/09/2007  . GERD 05/09/2007  . PEPTIC ULCER DISEASE 05/09/2007  . IBS 05/09/2007  . ARTHRITIS 05/09/2007  . HIP PAIN, LEFT 05/09/2007  . NECK PAIN, CHRONIC 05/09/2007  . LOW BACK PAIN 05/09/2007  . CONSTIPATION, HX OF 05/09/2007  . HX,  PERSONAL, PAST NONCOMPLIANCE 05/09/2007   Past Medical History:  Diagnosis Date  . ADHD (attention deficit hyperactivity disorder)   . Anginal pain ([place])   . Asthma   . BMI (body mass index) 20.0-29.9 2009 127 LBS  . Chronic abdominal pain 2003   EX LAP APR 2004 RUPTURE L OV CYST, JUL 2004 ADHESIONS  . Chronic nausea   . Clostridium difficile colitis 04/10/11 &11/2011   tx w/ flagyl  . COPD (chronic obstructive pulmonary disease) ([place])   . Degenerative disc disease   . GERD (gastroesophageal reflux disease)   . Hiatal hernia 2008   on EGD above  . Irritable bowel syndrome 2004 CONSTIPATION  . Migraines   . MRSA infection    Dr. [name], currently under treatment  . PTSD (post-traumatic stress disorder)   . PUD (peptic ulcer disease) 01/2007   EGD Dr [name], 2 antral ulcers, negative h pylori  . Rectal prolapse   . S/P colonoscopy 06/02/09   normal ([name])  . S/P endoscopy 03/26/11   gastritis-Dr [name]  . SBO (small bowel obstruction) ([facility])     Past Surgical History:  Procedure Laterality Date  . ABDOMINAL HYSTERECTOMY    . APPENDECTOMY    . BACK SURGERY    . BOWEL RESECTION     x2, secondary to adhesions  . CERVICAL SPINE SURGERY    . COLONOSCOPY  03/21/2012    TMH:DQQIWL ADENOMA(1) POOR PREP  . COLONOSCOPY N/A 11/17/2013   NLG:XQJJHE mucosa in the terminal ileum/NORMAL surgical anastomosis/Small internal hemorrhoids  . ESOPHAGOGASTRODUODENOSCOPY  03/23/2011    Normal esophagus without evidence of Barrett's, mass, erosions, or ulcerations./  Patchy erythema with occasional erosion in the antrum.  Biopsies  obtained via cold forceps to evaluate for H. pylori gastritis/ Small hiatal hernia./Normal duodenal bulb and second portion of the duodenum.  . ESOPHAGOGASTRODUODENOSCOPY  03/21/2012   EHU:DJSHF Hiatal hernia/ABDOMINALPAIN/DIARRHEA MOST LIKELY DUE TO IBS, GASTRITIS DUODENITIS  . FLEXIBLE SIGMOIDOSCOPY  07/14/2002   Normal limited flexible sigmoidoscopy with  stool in the rectum and rectosigmoid precluding a full colonoscopy  . LAPAROSCOPIC LYSIS INTESTINAL ADHESIONS    . NECK SURGERY  2009  . PARTIAL HYSTERECTOMY    . TUBAL LIGATION    . UMBILICAL HERNIA REPAIR  2008   x5  . UPPER GASTROINTESTINAL ENDOSCOPY  APR 2008 [facility] BLEEDING/PAIN   PUD  . UPPER GASTROINTESTINAL ENDOSCOPY  JUL 2012 [facility] PAIN   MILD GASTRITIS    No prescriptions prior to admission.   Allergies  Allergen Reactions  . Penicillins Anaphylaxis    Childhood allergy  Has patient had a PCN reaction causing immediate rash, facial/tongue/throat swelling, SOB or lightheadedness with hypotension: Yes Has patient had a PCN reaction causing severe rash involving mucus membranes or skin necrosis: No Has patient had a PCN reaction that required hospitalization Yes Has patient had a PCN reaction occurring within the last 10 years: No If all of the above answers are "NO", then may proceed with Cephalosporin use.   . Clarithromycin Nausea And Vomiting  . Moxifloxacin Nausea And Vomiting  . Quinolones Nausea And Vomiting  . Salicylates Nausea And Vomiting  . Lactose Intolerance (Gi) Diarrhea  . Lactulose Diarrhea  . Aspirin Other (See Comments)    Burning of stomach. REACTION: GI upset  . Buprenorphine Hcl Itching    Suboxone  . Ibuprofen Nausea Only and Other (See Comments)    Severe heartburn, Upset stomach due to acid reflux  . Morphine And Related Itching  . Sulfa Antibiotics Itching    Social History  Substance Use Topics  . Smoking status: Current Every Day Smoker    Packs/day: 0.50    Years: 10.00    Types: Cigarettes  . Smokeless tobacco: Former Systems developer  . Alcohol use No    Family History  Problem Relation Age of Onset  . Adopted: Yes     Review of Systems  Constitutional: Negative.   HENT: Negative.   Eyes: Negative.   Respiratory: Negative.   Cardiovascular: Negative.   Gastrointestinal: Negative.   Genitourinary: Negative.   Musculoskeletal: Positive  for joint pain.  Skin: Negative.   Neurological: Negative.   Psychiatric/Behavioral: Negative.     Objective:  Physical Exam  Constitutional: She is oriented to person, place, and time. She appears well-developed. No distress.  HENT:  Head: Normocephalic.  Eyes: Pupils are equal, round, and reactive to light. EOM are normal.  Neck: Normal range of motion.  Respiratory: No respiratory distress.  GI: She exhibits no distension.  Musculoskeletal: She exhibits tenderness.  Neurological: She is alert and oriented to person, place, and time.  Skin: Skin is warm and dry.  Psychiatric: She has a normal mood and affect.    Vital signs in last 24 hours:    Labs:   Estimated body mass index is 28.54 kg/m as calculated from the following:   Height as of 04/30/17: 5\' 5"  (1.651 m).   Weight as of 04/30/17: 171 lb 8 oz (77.8 kg).   Imaging Review Plain radiographs demonstrate moderate degenerative joint disease of the right knee(s). The overall alignment ismild varus. The bone quality appears to be good for age  and reported activity level.  Assessment/Plan:  End stage arthritis, right knee   The patient history, physical examination, clinical judgment of the provider and imaging studies are consistent with end stage degenerative joint disease of the right knee(s) and total knee arthroplasty is deemed medically necessary. The treatment options including medical management, injection therapy arthroscopy and arthroplasty were discussed at length. The risks and benefits of total knee arthroplasty were presented and reviewed. The risks due to aseptic loosening, infection, stiffness, patella tracking problems, thromboembolic complications and other imponderables were discussed. The patient acknowledged the explanation, agreed to proceed with the plan and consent was signed. Patient is being admitted for inpatient treatment for surgery, pain control, PT, OT, prophylactic antibiotics, VTE  prophylaxis, progressive ambulation and ADL's and discharge planning. The patient is planning to be discharged home with home health services

## 2017-05-06 NOTE — Anesthesia Procedure Notes (Signed)
Anesthesia Regional Block: Adductor canal block   Pre-Anesthetic Checklist: ,, timeout performed, Correct Patient, Correct Site, Correct Laterality, Correct Procedure, Correct Position, site marked, Risks and benefits discussed,  Surgical consent,  Pre-op evaluation,  At surgeon's request and post-op pain management  Laterality: Right  Prep: chloraprep       Needles:  Injection technique: Single-shot  Needle Type: Echogenic Needle     Needle Length: 9cm  Needle Gauge: 21     Additional Needles:   Procedures: ultrasound guided,,,,,,,,  Narrative:  Start time: 05/06/2017 12:15 PM End time: 05/06/2017 12:20 PM Injection made incrementally with aspirations every 5 mL.  Performed by: Personally  Anesthesiologist: Suella Broad D  Additional Notes: Tolerate well. No issues.

## 2017-05-06 NOTE — Anesthesia Procedure Notes (Signed)
Procedure Name: Intubation Date/Time: 05/06/2017 1:21 PM Performed by: Salli Quarry Ahlam Piscitelli Pre-anesthesia Checklist: Patient identified, Emergency Drugs available, Suction available and Patient being monitored Patient Re-evaluated:Patient Re-evaluated prior to induction Oxygen Delivery Method: Circle System Utilized Preoxygenation: Pre-oxygenation with 100% oxygen Induction Type: IV induction Ventilation: Mask ventilation without difficulty Laryngoscope Size: Mac and 3 Grade View: Grade I Tube type: Oral Tube size: 7.0 mm Number of attempts: 1 Airway Equipment and Method: Stylet Placement Confirmation: ETT inserted through vocal cords under direct vision,  positive ETCO2 and breath sounds checked- equal and bilateral Secured at: 21 cm Tube secured with: Tape Dental Injury: Teeth and Oropharynx as per pre-operative assessment

## 2017-05-06 NOTE — Progress Notes (Signed)
Orthopedic Tech Progress Note Patient Details:  [name] [birth year] [id number]  CPM Right Knee Right Knee Flexion (Degrees): 90 Right Knee Extension (Degrees): 0 Additional Comments: trapeze bar patient helper   Hildred Priest 05/06/2017, 4:52 PM Viewed order from doctor's order list

## 2017-05-07 ENCOUNTER — Encounter (HOSPITAL_COMMUNITY): Payer: Self-pay | Admitting: General Practice

## 2017-05-07 LAB — BASIC METABOLIC PANEL
Anion gap: 5 (ref 5–15)
BUN: 13 mg/dL (ref 6–20)
CALCIUM: 8.6 mg/dL — AB (ref 8.9–10.3)
CO2: 28 mmol/L (ref 22–32)
CREATININE: 0.61 mg/dL (ref 0.44–1.00)
Chloride: 103 mmol/L (ref 101–111)
GFR calc Af Amer: >60 mL/min (ref >60)
GFR calc non Af Amer: >60 mL/min (ref >60)
GLUCOSE: 134 mg/dL — AB (ref 65–99)
Potassium: 4.3 mmol/L (ref 3.5–5.1)
Sodium: 136 mmol/L (ref 135–145)

## 2017-05-07 LAB — CBC
HEMATOCRIT: 34.7 % — AB (ref 36.0–46.0)
Hemoglobin: 10.6 g/dL — ABNORMAL LOW (ref 12.0–15.0)
MCH: 28 pg (ref 26.0–34.0)
MCHC: 30.5 g/dL (ref 30.0–36.0)
MCV: 91.8 fL (ref 78.0–100.0)
PLATELETS: 378 10*3/uL (ref 150–400)
RBC: 3.78 MIL/uL — ABNORMAL LOW (ref 3.87–5.11)
RDW: 14 % (ref 11.5–15.5)
WBC: 17.7 10*3/uL — ABNORMAL HIGH (ref 4.0–10.5)

## 2017-05-07 MED ORDER — PNEUMOCOCCAL VAC POLYVALENT 25 MCG/0.5ML IJ INJ: 0.5000 mL | INJECTION | INTRAMUSCULAR | Status: DC | PRN

## 2017-05-07 NOTE — Evaluation (Signed)
Occupational Therapy Evaluation Patient Details Name: [name] MRN: [id number] DOB: 1961/02/11 Today's Date: 05/07/2017    History of Present Illness Pt is a 56 yo female who was admitted for elective R TKA. PMH: depression, anxiety, low back pain, IBS, and ADHD. Pt reports having PTSD from childhood and incident 15 yrs ago PSH: bowel resection. back surgery, neck surgery, umbilical hernia repair.   Clinical Impression   PTA, pt was living alone and was independent. Currently, pt requires Min A for LBs ADLs and Min Guard for functional transfers using RW. Provided education on LB ADLs; pt demonstrated understanding. Pt reports dizziness with activity and is limited by pain and drowsiness. Pt would benefit from acute OT to facilitate safe dc and increase safety during ADLs. Recommend dc SNF for further OT to increase pt safety and independence with ADLs and functional mobility prior to transitioning home.    Follow Up Recommendations  SNF    Equipment Recommendations  Other (comment) (Defer to next venue)    Recommendations for Other Services PT consult     Precautions / Restrictions Precautions Precautions: Fall Precaution Comments: pt dizzy and sleepy Required Braces or Orthoses: Knee Immobilizer - Right Knee Immobilizer - Right:  (on except in CPM and with PT) Restrictions Weight Bearing Restrictions: Yes RLE Weight Bearing: Weight bearing as tolerated      Mobility Bed Mobility Overal bed mobility: Needs Assistance Bed Mobility: Supine to Sit     Supine to sit: Min assist     General bed mobility comments: Pt in recliner upon arrival  Transfers Overall transfer level: Needs assistance Equipment used: Rolling walker (2 wheeled) Transfers: Sit to/from Stand Sit to Stand: Min guard         General transfer comment: v/c's for safe hand placement, increased time, minimal R LE WBing    Balance Overall balance assessment: Needs assistance Sitting-balance  support: Feet supported;No upper extremity supported Sitting balance-Leahy Scale: Fair     Standing balance support: Single extremity supported;During functional activity Standing balance-Leahy Scale: Fair Standing balance comment: Performed LB bathing in standing with single UE support on RW and no LOB                           ADL either performed or assessed with clinical judgement   ADL Overall ADL's : Needs assistance/impaired Eating/Feeding: Set up;Sitting   Grooming: Set up;Sitting   Upper Body Bathing: Set up;Sitting   Lower Body Bathing: Min guard;Sit to/from stand Lower Body Bathing Details (indicate cue type and reason): Min guard for safety in standing while performing LB bathing. Pt completed peri care in standing Upper Body Dressing : Set up;Sitting   Lower Body Dressing: Cueing for sequencing;Sit to/from stand;Minimal assistance Lower Body Dressing Details (indicate cue type and reason): Min A for donning underwear over R  and Min guard for safety in standing. Min VCs for sequencing and safe technique               General ADL Comments: Pt limited by drowsiness and pain. Pt completed LB ADLs with Min guard A for safety and demosntrated safe technique. O2 stats dropped to 83 during activity on 3L O2     Vision         Perception     Praxis      Pertinent Vitals/Pain Pain Assessment: 0-10 Pain Score: 8  Pain Location: R knee Pain Descriptors / Indicators: Constant Pain Intervention(s): Monitored during session;Limited  activity within patient's tolerance;Repositioned;Ice applied     Hand Dominance Right   Extremity/Trunk Assessment Upper Extremity Assessment Upper Extremity Assessment: Overall WFL for tasks assessed   Lower Extremity Assessment Lower Extremity Assessment: Defer to PT evaluation RLE Deficits / Details: trace quad set, approx 20 deg AA knee flexion   Cervical / Trunk Assessment Cervical / Trunk Assessment: Normal    Communication Communication Communication: No difficulties   Cognition Arousal/Alertness: Suspect due to medications (Drowsy) Behavior During Therapy: WFL for tasks assessed/performed Overall Cognitive Status: Within Functional Limits for tasks assessed                                 General Comments: v/c's to keep eyes open, pt c/o "i'm dizzy"   General Comments  Pt reports feelign dizzy at end of session. BP 131/70; HR 84 after activity    Exercises Exercises: Total Joint Total Joint Exercises Ankle Circles/Pumps: AROM;Both;10 reps;Supine Quad Sets: AROM;Right;15 reps;Supine Heel Slides: AAROM;Right;10 reps;Supine   Shoulder Instructions      Home Living Family/patient expects to be discharged to:: Skilled nursing facility Living Arrangements: Alone   Type of Home: Apartment Home Access: Level entry     Home Layout: One level     Bathroom Shower/Tub: Tub/shower unit;Curtain   Biochemist, clinical: Handicapped height     Home Equipment: Tub bench;Grab bars - tub/shower;Grab bars - toilet;Hand held shower head   Additional Comments: Has a dog which she walks daily      Prior Functioning/Environment Level of Independence: Independent        Comments: ADLs and IADLs        OT Problem List: Decreased range of motion;Decreased activity tolerance;Impaired balance (sitting and/or standing)      OT Treatment/Interventions: Self-care/ADL training;Therapeutic exercise;Energy conservation;DME and/or AE instruction;Therapeutic activities;Patient/family education    OT Goals(Current goals can be found in the care plan section) Acute Rehab OT Goals Patient Stated Goal: to rehab then home OT Goal Formulation: With patient Time For Goal Achievement: 05/21/17 Potential to Achieve Goals: Good ADL Goals Pt Will Perform Grooming: with min guard assist;standing Pt Will Perform Lower Body Dressing: with set-up;with supervision;sit to/from stand Pt Will Transfer  to Toilet: with set-up;with supervision;bedside commode;ambulating Pt Will Perform Toileting - Clothing Manipulation and hygiene: with set-up;with supervision;sit to/from stand  OT Frequency: Min 2X/week   Barriers to D/C: Decreased caregiver support  Pt lives alone       Co-evaluation              AM-PAC PT "6 Clicks" Daily Activity     Outcome Measure Help from another person eating meals?: None Help from another person taking care of personal grooming?: A Little Help from another person toileting, which includes using toliet, bedpan, or urinal?: A Little Help from another person bathing (including washing, rinsing, drying)?: A Little Help from another person to put on and taking off regular upper body clothing?: A Little Help from another person to put on and taking off regular lower body clothing?: A Little 6 Click Score: 19   End of Session Equipment Utilized During Treatment: Gait belt;Rolling walker CPM Right Knee CPM Right Knee: Off Nurse Communication: Mobility status  Activity Tolerance: Patient tolerated treatment well;Patient limited by pain;Patient limited by fatigue Patient left: in chair;with call bell/phone within reach  OT Visit Diagnosis: Unsteadiness on feet (R26.81);Other abnormalities of gait and mobility (R26.89);Muscle weakness (generalized) (M62.81);Pain Pain - Right/Left: Right Pain -  part of body: Knee                Time: 1009-1030 OT Time Calculation (min): 21 min Charges:  OT General Charges $OT Visit: 1 Procedure OT Evaluation $OT Eval Low Complexity: 1 Procedure G-Codes:     Ebony Yorio MSOT, OTR/L Acute Rehab Pager: [phone] Office: [place] 05/07/2017, 12:26 PM

## 2017-05-07 NOTE — Clinical Social Work Note (Signed)
Clinical Social Work Assessment  Patient Details  Name: [name] MRN: [id number] Date of Birth: [birth year]  Date of referral:  05/07/17               Reason for consult:  Facility Placement                Permission sought to share information with:    Permission granted to share information::  Yes, Verbal Permission Granted  Name::     responsible for self  Agency::  SNF  Relationship::     Contact Information:     Housing/Transportation Living arrangements for the past 2 months:  Single Family Home Source of Information:  Patient Patient Interpreter Needed:  None Criminal Activity/Legal Involvement Pertinent to Current Situation/Hospitalization:  No - Comment as needed Significant Relationships:  Pets Lives with:  Self, Pets Do you feel safe going back to the place where you live?  No Need for family participation in patient care:  No (Coment)  Care giving concerns:  Patient from home alone and is not safe to return at this time. She has good support however no one can care for her at home.  Social Worker assessment / plan:  CSW met with patient at bedside to discuss clinical teams recommendation for short term rehab.  CSW explained role and SNF options/placement. Pt has no experience with SNF.  CSW obtained permission to send offers to [facility]. FL2 complete. passr number confirmed. Offers sent.  Employment status:  Other (Comment), Disabled (Comment on whether or not currently receiving Disability) Insurance information:  Managed Care PT Recommendations:    Information / Referral to community resources:  [place]  Patient/Family's Response to care:  Patient reports no issues with care.  Patient/Family's Understanding of and Emotional Response to Diagnosis, Current Treatment, and Prognosis:  Patient has good understanding of diagnosis, current treatment, prognosis and hopeful that she will improve soon to get home to pet. No issues or concerns  identified at this time.  Emotional Assessment Appearance:  Appears stated age Attitude/Demeanor/Rapport:   (Cooperative) Affect (typically observed):  Accepting, Appropriate Orientation:  Oriented to Self, Oriented to Place, Oriented to  Time, Oriented to Situation Alcohol / Substance use:  Not Applicable Psych involvement (Current and /or in the community):  No (Comment)  Discharge Needs  Concerns to be addressed:  Care Coordination Readmission within the last 30 days:  No Current discharge risk:  Physical Impairment, Dependent with Mobility Barriers to Discharge:  No Barriers Identified   Normajean Baxter, LCSW 05/07/2017, 10:42 AM

## 2017-05-07 NOTE — Anesthesia Postprocedure Evaluation (Signed)
Anesthesia Post Note  Patient: [name]  Procedure(s) Performed: Procedure(s) (LRB): RIGHT TOTAL KNEE ARTHROPLASTY (Right)     Patient location during evaluation: Other Anesthesia Type: General Level of consciousness: awake and alert Pain management: pain level controlled Vital Signs Assessment: post-procedure vital signs reviewed and stable Respiratory status: spontaneous breathing, nonlabored ventilation, respiratory function stable and patient connected to nasal cannula oxygen Cardiovascular status: blood pressure returned to baseline and stable Postop Assessment: no signs of nausea or vomiting Anesthetic complications: no    Last Vitals:  Vitals:   05/06/17 2013 05/07/17 0500  BP: 107/72 114/67  Pulse: 90 77  Resp: 16 15  Temp: 37 C 37 C    Last Pain:  Vitals:   05/07/17 0619  TempSrc:   PainSc: Asleep                 Effie Berkshire

## 2017-05-07 NOTE — Care Management (Signed)
56 yr old female s/p right total knee arthroplasty, patient will need shortterm SNF, social worker is aware. No further Case manager needs.

## 2017-05-07 NOTE — NC FL2 (Signed)
[place] LEVEL OF CARE SCREENING TOOL     IDENTIFICATION  Patient Name: [name] Birthdate: 02/05/1961 Sex: female Admission Date (Current Location): 05/06/2017  [facility] and [place] Number:  Herbalist and Address:  [facility] [place], [place] [address] [place], [place] [address]      Provider Number: [id number]  Attending Physician Name and Address:  Marybelle Killings, MD  Relative Name and Phone Number:       Current Level of Care: Hospital Recommended Level of Care: [place] Prior Approval Number:    Date Approved/Denied: 05/07/17 PASRR Number: [id number]  Discharge Plan: SNF    Current Diagnoses: Patient Active Problem List   Diagnosis Date Noted  . Arthritis of right knee 05/06/2017  . Swelling of right lower extremity 04/23/2017  . Unilateral primary osteoarthritis, right knee 03/28/2017  . Acute pain of right knee 01/13/2017  . Left genital labial abscess 10/17/2016  . Labial abscess 10/17/2016  . Anxiety state 03/15/2015  . Anemia 03/14/2015  . COPD (chronic obstructive pulmonary disease) ([place]) 03/13/2015  . Ileus ([place]) 04/18/2014  . Tobacco abuse 04/18/2014  . Hx of adenomatous colonic polyps 09/07/2013  . Muscle spasms of neck 06/09/2013  . Cervical pain 03/17/2013  . C. difficile colitis 06/25/2011  . Chronic nausea 03/13/2011  . DEPRESSION 05/09/2007  . ALLERGIC RHINITIS 05/09/2007  . ASTHMA 05/09/2007  . GERD 05/09/2007  . PEPTIC ULCER DISEASE 05/09/2007  . IBS 05/09/2007  . ARTHRITIS 05/09/2007  . HIP PAIN, LEFT 05/09/2007  . NECK PAIN, CHRONIC 05/09/2007  . LOW BACK PAIN 05/09/2007  . CONSTIPATION, HX OF 05/09/2007  . HX, PERSONAL, PAST NONCOMPLIANCE 05/09/2007    Orientation RESPIRATION BLADDER Height & Weight     Self, Time, Situation, Place  O2 (Nasal Cannula 3L) Continent Weight: 171 lb 8 oz (77.8 kg) Height:  5\' 5"  (165.1 cm)  BEHAVIORAL SYMPTOMS/MOOD NEUROLOGICAL BOWEL  NUTRITION STATUS      Continent Diet (See DC Summary)  AMBULATORY STATUS COMMUNICATION OF NEEDS Skin   Limited Assist Verbally Surgical wounds (Right Knee Closed Incision with Compression Wrap)                       Personal Care Assistance Level of Assistance  Bathing, Feeding, Dressing Bathing Assistance: Limited assistance Feeding assistance: Independent Dressing Assistance: Limited assistance     Functional Limitations Info             SPECIAL CARE FACTORS FREQUENCY  PT (By licensed PT), OT (By licensed OT)     PT Frequency: 7xweek OT Frequency: 2x week            Contractures      Additional Factors Info  Code Status, Allergies Code Status Info: Full Code Allergies Info: PENICILLINS, CLARITHROMYCIN, MOXIFLOXACIN, QUINOLONES, SALICYLATES, LACTOSE INTOLERANCE (GI), LACTULOSE, ASPIRIN, BUPRENORPHINE HCL, IBUPROFEN, MORPHINE AND RELATED, SULFA ANTIBIOTICS            Current Medications (05/07/2017):  This is the current hospital active medication list Current Facility-Administered Medications  Medication Dose Route Frequency Provider Last Rate Last Dose  . 0.9 %  sodium chloride infusion   Intravenous Continuous Lanae Crumbly, PA-C 85 mL/hr at 05/06/17 1748    . acetaminophen (TYLENOL) tablet 650 mg  650 mg Oral Q6H PRN Lanae Crumbly, PA-C   650 mg at 05/07/17 0321   Or  . acetaminophen (TYLENOL) suppository 650 mg  650 mg Rectal Q6H  PRN Lanae Crumbly, PA-C      . albuterol (PROVENTIL) (2.5 MG/3ML) 0.083% nebulizer solution 2.5 mg  2.5 mg Nebulization Q6H PRN Lanae Crumbly, PA-C   2.5 mg at 05/07/17 1219  . bupivacaine liposome (EXPAREL) 1.3 % injection 266 mg  20 mL Infiltration Once Marybelle Killings, MD      . docusate sodium (COLACE) capsule 100 mg  100 mg Oral BID Lanae Crumbly, PA-C   100 mg at 05/07/17 8341  . DULoxetine (CYMBALTA) DR capsule 120 mg  120 mg Oral Daily Lanae Crumbly, PA-C   120 mg at 05/07/17 0820  . enoxaparin (LOVENOX) injection 30  mg  30 mg Subcutaneous Q12H Lanae Crumbly, PA-C   30 mg at 05/07/17 9622  . fentaNYL (SUBLIMAZE) injection 100 mcg  100 mcg Intravenous Once Effie Berkshire, MD      . gabapentin (NEURONTIN) capsule 300 mg  300 mg Oral q morning - 10a Lanae Crumbly, PA-C   300 mg at 05/07/17 2979  . gabapentin (NEURONTIN) capsule 600 mg  600 mg Oral QHS Marybelle Killings, MD   600 mg at 05/06/17 2206  . lactated ringers infusion   Intravenous Continuous Effie Berkshire, MD 50 mL/hr at 05/06/17 1143    . LORazepam (ATIVAN) tablet 1 mg  1 mg Oral TID PRN Lanae Crumbly, PA-C   1 mg at 05/07/17 1035  . menthol-cetylpyridinium (CEPACOL) lozenge 3 mg  1 lozenge Oral PRN Lanae Crumbly, PA-C       Or  . phenol (CHLORASEPTIC) mouth spray 1 spray  1 spray Mouth/Throat PRN Lanae Crumbly, PA-C      . methocarbamol (ROBAXIN) tablet 500 mg  500 mg Oral Q6H PRN Lanae Crumbly, PA-C   500 mg at 05/07/17 0815   Or  . methocarbamol (ROBAXIN) 500 mg in dextrose 5 % 50 mL IVPB  500 mg Intravenous Q6H PRN Lanae Crumbly, PA-C      . metoCLOPramide (REGLAN) tablet 5-10 mg  5-10 mg Oral Q8H PRN Lanae Crumbly, PA-C       Or  . metoCLOPramide (REGLAN) injection 5-10 mg  5-10 mg Intravenous Q8H PRN Benjiman Core M, PA-C      . midazolam (VERSED) injection 2 mg  2 mg Intravenous Once Effie Berkshire, MD      . mirtazapine (REMERON) tablet 15 mg  15 mg Oral QHS PRN Lanae Crumbly, PA-C   15 mg at 05/06/17 2206  . ondansetron (ZOFRAN) tablet 4 mg  4 mg Oral Q6H PRN Lanae Crumbly, PA-C       Or  . ondansetron [facility]) injection 4 mg  4 mg Intravenous Q6H PRN Lanae Crumbly, PA-C   4 mg at 05/07/17 1529  . oxyCODONE (Oxy IR/ROXICODONE) immediate release tablet 5-10 mg  5-10 mg Oral Q4H PRN Lanae Crumbly, PA-C   10 mg at 05/07/17 1216  . pantoprazole (PROTONIX) EC tablet 40 mg  40 mg Oral Daily Lanae Crumbly, PA-C   40 mg at 05/07/17 8921  . pneumococcal 23 valent vaccine (PNU-IMMUNE) injection 0.5 mL  0.5 mL Intramuscular Prior to  discharge Marybelle Killings, MD      . polyethylene glycol (MIRALAX / GLYCOLAX) packet 17 g  17 g Oral Daily PRN Lanae Crumbly, PA-C         Discharge Medications: Please see discharge summary for a list of discharge medications.  Relevant Imaging  Results:  Relevant Lab Results:   Additional Information SS#:[id number]  Normajean Baxter, LCSW

## 2017-05-07 NOTE — Progress Notes (Addendum)
   Subjective: 1 Day Post-Op Procedure(s) (LRB): RIGHT TOTAL KNEE ARTHROPLASTY (Right) Patient reports pain as moderate.    Objective: Vital signs in last 24 hours: Temp:  [97 F (36.1 C)-98.6 F (37 C)] 98.6 F (37 C) (07/31 0500) Pulse Rate:  [77-92] 77 (07/31 0500) Resp:  [8-23] 15 (07/31 0500) BP: (101-124)/(64-86) 114/67 (07/31 0500) SpO2:  [91 %-100 %] 94 % (07/31 0536) Weight:  [171 lb 8 oz (77.8 kg)] 171 lb 8 oz (77.8 kg) (07/30 1026)  Intake/Output from previous day: 07/30 0701 - 07/31 0700 In: 900 [I.V.:900] Out: 50 [Blood:50] Intake/Output this shift: No intake/output data recorded.   Recent Labs  05/07/17 0321  HGB 10.6*    Recent Labs  05/07/17 0321  WBC 17.7*  RBC 3.78*  HCT 34.7*  PLT 378    Recent Labs  05/07/17 0321  NA 136  K 4.3  CL 103  CO2 28  BUN 13  CREATININE 0.61  GLUCOSE 134*  CALCIUM 8.6*   No results for input(s): LABPT, INR in the last 72 hours.  Neurologically intact Dg Knee 1-2 Views Right  Result Date: 05/06/2017 CLINICAL DATA:  56 year old female status post knee arthroplasty. EXAM: RIGHT KNEE - 1-2 VIEW COMPARISON:  [facility] right knee MRI 03/25/2017. Right knee radiographs 12/20/2016. FINDINGS: Portable AP and cross-table lateral views of the right knee at 1630 hours. Sequelae of right total knee arthroplasty. Hardware appears intact and normally aligned. No unexpected osseous changes. Soft tissue postoperative changes including soft tissue gas and anterior skin staples. IMPRESSION: Status post right total knee arthroplasty with no adverse features. Electronically Signed   By: Genevie Ann M.D.   On: 05/06/2017 17:02    Assessment/Plan: 1 Day Post-Op Procedure(s) (LRB): RIGHT TOTAL KNEE ARTHROPLASTY (Right) Up with therapy, lives alone will need SNF in [place] 05/07/2017, 7:38 AM

## 2017-05-07 NOTE — Evaluation (Signed)
Physical Therapy Evaluation Patient Details Name: [name] MRN: [id number] DOB: Jul 28, 1961 Today's Date: 05/07/2017   History of Present Illness  Pt is a 56 yo female who was admitted for elective R TKA. PMH: depression, anxiety, low back pain, IBS, and ADHD. Pt reports having PTSD from childhood and incident 15 yrs ago PSH: bowel resection. back surgery, neck surgery, umbilical hernia repair.  Clinical Impression  Pt is s/p TKA resulting in the deficits listed below (see PT Problem List).  Mobility limited by pain and lethargy. Pt will benefit from skilled PT to increase their independence and safety with mobility to allow discharge to the venue listed below.      Follow Up Recommendations SNF;Supervision/Assistance - 24 hour    Equipment Recommendations  None recommended by PT (TBD at next venue)    Recommendations for Other Services       Precautions / Restrictions Precautions Precautions: Fall Precaution Comments: pt dizzy and sleepy Required Braces or Orthoses: Knee Immobilizer - Right Knee Immobilizer - Right:  (on except in CPM and with PT) Restrictions Weight Bearing Restrictions: Yes RLE Weight Bearing: Weight bearing as tolerated      Mobility  Bed Mobility Overal bed mobility: Needs Assistance Bed Mobility: Supine to Sit     Supine to sit: Min assist     General bed mobility comments: HOB elevated, used bed rails, minA for R LE management due to pain  Transfers Overall transfer level: Needs assistance Equipment used: Rolling walker (2 wheeled) Transfers: Sit to/from Stand Sit to Stand: Min assist         General transfer comment: v/c's for safe hand placement, increased time, minimal R LE WBing  Ambulation/Gait Ambulation/Gait assistance: Mod assist Ambulation Distance (Feet): 2 Feet Assistive device: Rolling walker (2 wheeled) Gait Pattern/deviations: Step-to pattern;Decreased stride length;Antalgic Gait velocity: slow Gait velocity  interpretation: Below normal speed for age/gender General Gait Details: pt with minimal R LE WBing tolerance. Pt with c/o "I"m dizzy" requiring the need to sit.   Stairs            Wheelchair Mobility    Modified Rankin (Stroke Patients Only)       Balance Overall balance assessment: Needs assistance Sitting-balance support: Feet supported;Single extremity supported Sitting balance-Leahy Scale: Fair     Standing balance support: Bilateral upper extremity supported Standing balance-Leahy Scale: Poor Standing balance comment: dependent on RW                             Pertinent Vitals/Pain Pain Assessment: 0-10 Pain Score: 8  Pain Location: R knee Pain Descriptors / Indicators: Constant Pain Intervention(s): Monitored during session    Home Living Family/patient expects to be discharged to:: Skilled nursing facility                 Additional Comments: pt lived alone    Prior Function Level of Independence: Independent         Comments: used cane, was walking dog     Hand Dominance   Dominant Hand: Right    Extremity/Trunk Assessment   Upper Extremity Assessment Upper Extremity Assessment: Overall WFL for tasks assessed    Lower Extremity Assessment Lower Extremity Assessment: RLE deficits/detail RLE Deficits / Details: trace quad set, approx 20 deg AA knee flexion    Cervical / Trunk Assessment Cervical / Trunk Assessment: Normal  Communication   Communication: No difficulties  Cognition Arousal/Alertness: Suspect due to medications (sleepy)  Behavior During Therapy: WFL for tasks assessed/performed Overall Cognitive Status: Within Functional Limits for tasks assessed                                 General Comments: v/c's to keep eyes open, pt c/o "i'm dizzy"      General Comments      Exercises Total Joint Exercises Ankle Circles/Pumps: AROM;Both;10 reps;Supine Quad Sets: AROM;Right;15  reps;Supine Heel Slides: AAROM;Right;10 reps;Supine   Assessment/Plan    PT Assessment Patient needs continued PT services  PT Problem List Decreased strength;Decreased range of motion;Decreased activity tolerance;Decreased balance;Decreased mobility       PT Treatment Interventions DME instruction;Gait training;Stair training;Functional mobility training;Therapeutic activities;Therapeutic exercise;Balance training    PT Goals (Current goals can be found in the Care Plan section)  Acute Rehab PT Goals Patient Stated Goal: to rehab then home PT Goal Formulation: With patient Time For Goal Achievement: 05/14/17 Potential to Achieve Goals: Good    Frequency 7X/week   Barriers to discharge Decreased caregiver support lives alone    Co-evaluation               AM-PAC PT "6 Clicks" Daily Activity  Outcome Measure Difficulty turning over in bed (including adjusting bedclothes, sheets and blankets)?: Total Difficulty moving from lying on back to sitting on the side of the bed? : Total Difficulty sitting down on and standing up from a chair with arms (e.g., wheelchair, bedside commode, etc,.)?: A Little Help needed moving to and from a bed to chair (including a wheelchair)?: A Lot Help needed walking in hospital room?: A Lot Help needed climbing 3-5 steps with a railing? : A Lot 6 Click Score: 11    End of Session Equipment Utilized During Treatment: Gait belt;Right knee immobilizer Activity Tolerance: Patient limited by fatigue;Patient limited by lethargy;Patient limited by pain Patient left: in chair;with call bell/phone within reach Nurse Communication: Mobility status;Patient requests pain meds PT Visit Diagnosis: Difficulty in walking, not elsewhere classified (R26.2);Pain Pain - Right/Left: Right Pain - part of body: Knee    Time: 2706-2376 PT Time Calculation (min) (ACUTE ONLY): 32 min   Charges:   PT Evaluation $PT Eval Moderate Complexity: 1 Mod PT  Treatments $Therapeutic Activity: 8-22 mins   PT G Codes:        Kittie Plater, PT, DPT Pager #: [phone] Office #: [phone]   Hilliary Jock M Tijah Hane 05/07/2017, 9:52 AM

## 2017-05-08 LAB — CBC
HCT: 32.1 % — ABNORMAL LOW (ref 36.0–46.0)
Hemoglobin: 10 g/dL — ABNORMAL LOW (ref 12.0–15.0)
MCH: 28 pg (ref 26.0–34.0)
MCHC: 31.2 g/dL (ref 30.0–36.0)
MCV: 89.9 fL (ref 78.0–100.0)
PLATELETS: 335 10*3/uL (ref 150–400)
RBC: 3.57 MIL/uL — AB (ref 3.87–5.11)
RDW: 13.7 % (ref 11.5–15.5)
WBC: 17.2 10*3/uL — AB (ref 4.0–10.5)

## 2017-05-08 NOTE — Social Work (Addendum)
CSW provided client offers and patient has selected [facility] for short term rehab.  CSW spoke with admissions and confirmed that they can offer bed and patient can DC to [facility] at time of DC.  Elissa Hefty, LCSW Clinical Social Worker [phone]

## 2017-05-08 NOTE — Progress Notes (Signed)
Physical Therapy Treatment Patient Details Name: [name] MRN: [id number] DOB: November 08, 1960 Today's Date: 05/08/2017    History of Present Illness Pt is a 56 yo female who was admitted for elective R TKA. PMH: depression, anxiety, low back pain, IBS, and ADHD. Pt reports having PTSD from childhood and incident 15 yrs ago PSH: bowel resection. back surgery, neck surgery, umbilical hernia repair.    PT Comments    Pt became agitated this afternoon when she began to mobilize.  Reported dizziness and inability to walk and impulsively sat back down to bed. BP 151/82. Attempted there ex but pt would not. Attempted to put pt in CPM but pt would not allow LE to be lifted high enough to go in. PT will continue to follow.   Follow Up Recommendations  SNF;Supervision/Assistance - 24 hour     Equipment Recommendations  None recommended by PT (TBD at next venue)    Recommendations for Other Services       Precautions / Restrictions Precautions Precautions: Fall Precaution Comments: reviewed proper knee positioning Required Braces or Orthoses: Knee Immobilizer - Right Knee Immobilizer - Right: Other (comment) (on except in CPM and with PT) Restrictions Weight Bearing Restrictions: Yes RLE Weight Bearing: Weight bearing as tolerated    Mobility  Bed Mobility Overal bed mobility: Needs Assistance Bed Mobility: Supine to Sit;Sit to Supine     Supine to sit: Min assist Sit to supine: Mod assist   General bed mobility comments: needed assist to RLE for getting to EOB. Mod A for RLE for return to supine and positioning within bed  Transfers Overall transfer level: Needs assistance Equipment used: Rolling walker (2 wheeled) Transfers: Sit to/from Stand Sit to Stand: Min guard         General transfer comment: min-guard for safety, pt impulsively standing several times before lines ready, vc's needed for safety and attending to directions  Ambulation/Gait Ambulation/Gait  assistance: Min assist Ambulation Distance (Feet): 10 Feet Assistive device: Rolling walker (2 wheeled) Gait Pattern/deviations: Step-to pattern;Decreased stride length;Antalgic Gait velocity: slow Gait velocity interpretation: <1.8 ft/sec, indicative of risk for recurrent falls General Gait Details: encouraged pt to ambulate but she said she was dizzy and would not attempt. Sat back down and then laid down with inability to get legs into bed   Stairs            Wheelchair Mobility    Modified Rankin (Stroke Patients Only)       Balance Overall balance assessment: Needs assistance Sitting-balance support: Feet supported;No upper extremity supported Sitting balance-Leahy Scale: Fair     Standing balance support: During functional activity;Bilateral upper extremity supported Standing balance-Leahy Scale: Poor Standing balance comment: reliant on UE support                            Cognition Arousal/Alertness: Lethargic;Suspect due to medications Behavior During Therapy: Agitated Overall Cognitive Status: No family/caregiver present to determine baseline cognitive functioning                                 General Comments: pt reports that she wants to speak with her brother and sister but she can't because she can't remember how to work her cell phone. Pt agitated this afternoon and not following commands and becoming more unable to be reasoned with throughout session.      Exercises Total Joint Exercises  Ankle Circles/Pumps: AROM;Both;10 reps;Supine Quad Sets: AROM;Right;15 reps;Supine Heel Slides: AAROM;Right;10 reps;Supine Hip ABduction/ADduction: AAROM;Right;10 reps;Supine Straight Leg Raises: AAROM;Right;10 reps;Supine Long Arc Quad: AAROM;Right;10 reps;Seated Goniometric ROM: 10-70    General Comments General comments (skin integrity, edema, etc.): attempted to put pt in CPM but she would not allow her  RLE to be lifted high enough  to get into machine. And would not allow a 2nd try. She reported that she did not like the machine and was not told that she needed to be in it 6 hrs total      Pertinent Vitals/Pain Pain Assessment: Faces Faces Pain Scale: Hurts even more Pain Location: R knee Pain Descriptors / Indicators: Aching;Sore Pain Intervention(s): Limited activity within patient's tolerance    Home Living                      Prior Function            PT Goals (current goals can now be found in the care plan section) Acute Rehab PT Goals Patient Stated Goal: to rehab then home PT Goal Formulation: With patient Time For Goal Achievement: 05/14/17 Potential to Achieve Goals: Fair Progress towards PT goals: Not progressing toward goals - comment (agitation)    Frequency    7X/week      PT Plan Current plan remains appropriate    Co-evaluation              AM-PAC PT "6 Clicks" Daily Activity  Outcome Measure  Difficulty turning over in bed (including adjusting bedclothes, sheets and blankets)?: Total Difficulty moving from lying on back to sitting on the side of the bed? : Total Difficulty sitting down on and standing up from a chair with arms (e.g., wheelchair, bedside commode, etc,.)?: A Little Help needed moving to and from a bed to chair (including a wheelchair)?: A Little Help needed walking in hospital room?: A Little Help needed climbing 3-5 steps with a railing? : A Lot 6 Click Score: 13    End of Session Equipment Utilized During Treatment: Gait belt Activity Tolerance: Treatment limited secondary to agitation;Patient limited by lethargy Patient left: with call bell/phone within reach;in bed;with bed alarm set Nurse Communication: Mobility status PT Visit Diagnosis: Difficulty in walking, not elsewhere classified (R26.2);Pain Pain - Right/Left: Right Pain - part of body: Knee     Time: 5053-9767 PT Time Calculation (min) (ACUTE ONLY): 18 min  Charges:  $Gait  Training: 8-22 mins $Therapeutic Exercise: 8-22 mins $Therapeutic Activity: 8-22 mins                    G Codes:       Leighton Roach, PT  Acute Rehab Services  [place] 05/08/2017, 4:06 PM

## 2017-05-08 NOTE — Progress Notes (Addendum)
Physical Therapy Treatment Patient Details Name: [name] MRN: [id number] DOB: 12-03-1960 Today's Date: 05/08/2017    History of Present Illness Pt is a 56 yo female who was admitted for elective R TKA. PMH: depression, anxiety, low back pain, IBS, and ADHD. Pt reports having PTSD from childhood and incident 15 yrs ago PSH: bowel resection. back surgery, neck surgery, umbilical hernia repair.    PT Comments    Pt progressing today, was not dizzy with mobility but continues to be limited by pain as well as being mildly confused and needing frequent vc's for safety. She ambulated 10' with min A and RW. In addition, she is desating to 86% at rest on RA. Continue to recommend SNF and pt agreeable. PT will continue to follow.     Follow Up Recommendations  SNF;Supervision/Assistance - 24 hour     Equipment Recommendations  None recommended by PT (TBD at next venue)    Recommendations for Other Services       Precautions / Restrictions Precautions Precautions: Fall Precaution Comments: reviewed proper knee positioning Required Braces or Orthoses: Knee Immobilizer - Right Knee Immobilizer - Right:  (on except in CPM and with PT) Restrictions Weight Bearing Restrictions: Yes RLE Weight Bearing: Weight bearing as tolerated    Mobility  Bed Mobility Overal bed mobility: Needs Assistance Bed Mobility: Supine to Sit     Supine to sit: Min assist     General bed mobility comments: needed assist to RLE for getting to EOB  Transfers Overall transfer level: Needs assistance Equipment used: Rolling walker (2 wheeled) Transfers: Sit to/from Stand Sit to Stand: Min guard         General transfer comment: vc's for safe hand placement. Then vc's for pt to back all the way to chair before sitting  Ambulation/Gait Ambulation/Gait assistance: Min assist Ambulation Distance (Feet): 10 Feet Assistive device: Rolling walker (2 wheeled) Gait Pattern/deviations: Step-to  pattern;Decreased stride length;Antalgic Gait velocity: slow Gait velocity interpretation: <1.8 ft/sec, indicative of risk for recurrent falls General Gait Details: with vc's, pt began to out more wt through RLE which helped gait pattern to be more even. vc's had to be given several times for pt to understand   Stairs            Wheelchair Mobility    Modified Rankin (Stroke Patients Only)       Balance Overall balance assessment: Needs assistance Sitting-balance support: Feet supported;No upper extremity supported Sitting balance-Leahy Scale: Fair     Standing balance support: During functional activity;Bilateral upper extremity supported Standing balance-Leahy Scale: Poor Standing balance comment: reliant on UE support                            Cognition Arousal/Alertness: Awake/alert Behavior During Therapy: WFL for tasks assessed/performed Overall Cognitive Status: No family/caregiver present to determine baseline cognitive functioning                                 General Comments: pt somewhat confused with occasional nonsensical conversation and word finding difficulties. Seems like this may be close to her baseline but no family present to confirm      Exercises Total Joint Exercises Ankle Circles/Pumps: AROM;Both;10 reps;Supine Quad Sets: AROM;Right;15 reps;Supine Heel Slides: AAROM;Right;10 reps;Supine Hip ABduction/ADduction: AAROM;Right;10 reps;Supine Straight Leg Raises: AAROM;Right;10 reps;Supine Long Arc Quad: AAROM;Right;10 reps;Seated Goniometric ROM: 10-70    General  Comments General comments (skin integrity, edema, etc.): Pt was on O2 upon PT arrival with sats 94%, on RA, sats dropped as low as 86% but up to 90% with ambulation. Left O2 off pt with sats 92% after session but O2 beside her in case alarm began to go off indicating desat      Pertinent Vitals/Pain Pain Assessment: Faces Faces Pain Scale: Hurts even  more Pain Location: R knee Pain Descriptors / Indicators: Aching;Sore Pain Intervention(s): Limited activity within patient's tolerance;Monitored during session    Home Living                      Prior Function            PT Goals (current goals can now be found in the care plan section) Acute Rehab PT Goals Patient Stated Goal: to rehab then home PT Goal Formulation: With patient Time For Goal Achievement: 05/14/17 Potential to Achieve Goals: Good Progress towards PT goals: Progressing toward goals    Frequency    7X/week      PT Plan Current plan remains appropriate    Co-evaluation              AM-PAC PT "6 Clicks" Daily Activity  Outcome Measure  Difficulty turning over in bed (including adjusting bedclothes, sheets and blankets)?: Total Difficulty moving from lying on back to sitting on the side of the bed? : Total Difficulty sitting down on and standing up from a chair with arms (e.g., wheelchair, bedside commode, etc,.)?: A Little Help needed moving to and from a bed to chair (including a wheelchair)?: A Little Help needed walking in hospital room?: A Little Help needed climbing 3-5 steps with a railing? : A Lot 6 Click Score: 13    End of Session Equipment Utilized During Treatment: Gait belt Activity Tolerance: Patient limited by pain Patient left: in chair;with call bell/phone within reach Nurse Communication: Mobility status PT Visit Diagnosis: Difficulty in walking, not elsewhere classified (R26.2);Pain Pain - Right/Left: Right Pain - part of body: Knee     Time: 0981-1914 PT Time Calculation (min) (ACUTE ONLY): 23 min  Charges:  $Gait Training: 8-22 mins $Therapeutic Exercise: 8-22 mins                    G Codes:       Leighton Roach, PT  Acute Rehab Services  [place] 05/08/2017, 1:26 PM

## 2017-05-09 LAB — CBC
HEMATOCRIT: 29.8 % — AB (ref 36.0–46.0)
HEMOGLOBIN: 9.4 g/dL — AB (ref 12.0–15.0)
MCH: 28.6 pg (ref 26.0–34.0)
MCHC: 31.5 g/dL (ref 30.0–36.0)
MCV: 90.6 fL (ref 78.0–100.0)
Platelets: 335 10*3/uL (ref 150–400)
RBC: 3.29 MIL/uL — AB (ref 3.87–5.11)
RDW: 14.1 % (ref 11.5–15.5)
WBC: 17.2 10*3/uL — ABNORMAL HIGH (ref 4.0–10.5)

## 2017-05-09 MED ORDER — ENOXAPARIN SODIUM 30 MG/0.3ML ~~LOC~~ SOLN: 30.0000 mg | Freq: Two times a day (BID) | SUBCUTANEOUS | Status: DC

## 2017-05-09 MED ORDER — OXYCODONE-ACETAMINOPHEN 5-325 MG PO TABS: 1.0000 | ORAL_TABLET | ORAL | 0 refills | Status: DC | PRN

## 2017-05-09 NOTE — Clinical Social Work Placement (Addendum)
   CLINICAL SOCIAL WORK PLACEMENT  NOTE  Date:  05/09/2017  Patient Details  Name: [name] MRN: [id number] Date of Birth: February 02, 1961  Clinical Social Work is seeking post-discharge placement for this patient at the [place] level of care (*CSW will initial, date and re-position this form in  chart as items are completed):  Yes   Patient/family provided with [facility] Work Department's list of facilities offering this level of care within the geographic area requested by the patient (or if unable, by the patient's family).  Yes   Patient/family informed of their freedom to choose among providers that offer the needed level of care, that participate in Medicare, Medicaid or managed care program needed by the patient, have an available bed and are willing to accept the patient.  Yes   Patient/family informed of [facility]'s ownership interest in [facility] and [facility], as well as of the fact that they are under no obligation to receive care at these facilities.  PASRR submitted to EDS on       PASRR number received on       Existing PASRR number confirmed on 05/08/17     FL2 transmitted to all facilities in geographic area requested by pt/family on 05/08/17     FL2 transmitted to all facilities within larger geographic area on       Patient informed that his/her managed care company has contracts with or will negotiate with certain facilities, including the following:        Yes   Patient/family informed of bed offers received.  Patient chooses bed at [facility]     Physician recommends and patient chooses bed at      Patient to be transferred to [facility] on 05/09/17.  Patient to be transferred to facility by [facility]     Patient family notified on 05/09/17 of transfer.  Name of family member notified:  patient responsbile for self  However, CSW spoke with brother [name] and advised of transition    PHYSICIAN Please prepare priority discharge summary, including medications, Please prepare prescriptions, Please sign FL2     Additional Comment:    _______________________________________________ Normajean Baxter, LCSW 05/09/2017, 9:55 AM

## 2017-05-09 NOTE — Progress Notes (Signed)
Reviewed AVS with patient. Answered her questions.  Patient is stable and ready for discharge.  Waiting for [facility] to transport her to [facility]. Attempted to call report was placed on hold for 10 minutes and no one picked up. Will try calling back shortly.

## 2017-05-09 NOTE — Progress Notes (Signed)
Subjective: Doing well.  C/o right knee pain.     Objective: Vital signs in last 24 hours: Temp:  [98.7 F (37.1 C)-99.9 F (37.7 C)] 98.9 F (37.2 C) (08/02 0544) Pulse Rate:  [74-111] 111 (08/02 0544) Resp:  [18] 18 (08/02 0544) BP: (118-142)/(70-81) 118/70 (08/02 0544) SpO2:  [92 %-100 %] 92 % (08/02 0544)  Intake/Output from previous day: 08/01 0701 - 08/02 0700 In: 240 [P.O.:240] Out: 600 [Urine:600] Intake/Output this shift: No intake/output data recorded.   Recent Labs  05/07/17 0321 05/08/17 0337 05/09/17 0421  HGB 10.6* 10.0* 9.4*    Recent Labs  05/08/17 0337 05/09/17 0421  WBC 17.2* 17.2*  RBC 3.57* 3.29*  HCT 32.1* 29.8*  PLT 335 335    Recent Labs  05/07/17 0321  NA 136  K 4.3  CL 103  CO2 28  BUN 13  CREATININE 0.61  GLUCOSE 134*  CALCIUM 8.6*   No results for input(s): LABPT, INR in the last 72 hours.  Exam: Pleasant female. Alert and oriented.  NAD.  Wound looks good.  Staples intact.  No drainage or signs of infection.  Calf nontender.    Assessment/Plan: Transfer to SNF today.  Script on chart for percocet.  Will need lovenox 30mg  sq q12 hrs until 4 weeks postop. F/u with Dr Lorin Mercy 2 weeks postop.    Benjiman Core 05/09/2017, 9:00 AM

## 2017-05-09 NOTE — Discharge Summary (Signed)
Patient ID: [name] MRN: [id number] DOB/AGE: 1961-06-26 56 y.o.  Admit date: 05/06/2017 Discharge date: 05/09/2017  Admission Diagnoses:  Active Problems:   Arthritis of right knee   Discharge Diagnoses:  Active Problems:   Arthritis of right knee  status post Procedure(s): RIGHT TOTAL KNEE ARTHROPLASTY  Past Medical History:  Diagnosis Date  . ADHD (attention deficit hyperactivity disorder)   . Anginal pain ([place])   . Asthma   . BMI (body mass index) 20.0-29.9 2009 127 LBS  . Chronic abdominal pain 2003   EX LAP APR 2004 RUPTURE L OV CYST, JUL 2004 ADHESIONS  . Chronic nausea   . Clostridium difficile colitis 04/10/11 &11/2011   tx w/ flagyl  . COPD (chronic obstructive pulmonary disease) ([place])   . Degenerative disc disease   . Depression   . GERD (gastroesophageal reflux disease)   . Hiatal hernia 2008   on EGD above  . Irritable bowel syndrome 2004 CONSTIPATION  . Migraines   . MRSA infection    Dr. [name], currently under treatment  . PTSD (post-traumatic stress disorder)   . PUD (peptic ulcer disease) 01/2007   EGD Dr [name], 2 antral ulcers, negative h pylori  . Rectal prolapse   . S/P colonoscopy 06/02/09   normal ([name])  . S/P endoscopy 03/26/11   gastritis-Dr [name]  . SBO (small bowel obstruction) ([place])     Surgeries: Procedure(s): RIGHT TOTAL KNEE ARTHROPLASTY on 05/06/2017   Consultants:   Discharged Condition: Improved  Hospital Course: [name] is an 56 y.o. female who was admitted 05/06/2017 for operative treatment of knee DJD. Patient failed conservative treatments (please see the history and physical for the specifics) and had severe unremitting pain that affects sleep, daily activities and work/hobbies. After pre-op clearance, the patient was taken to the operating room on 05/06/2017 and underwent  Procedure(s): RIGHT TOTAL KNEE ARTHROPLASTY.    Patient was given perioperative antibiotics: Anti-infectives    Start      Dose/Rate Route Frequency Ordered Stop   05/07/17 0000  vancomycin (VANCOCIN) IVPB 1000 mg/200 mL premix     1,000 mg 200 mL/hr over 60 Minutes Intravenous Every 12 hours 05/06/17 1732 05/07/17 0158   05/06/17 1230  vancomycin (VANCOCIN) IVPB 1000 mg/200 mL premix     1,000 mg 200 mL/hr over 60 Minutes Intravenous  Once 05/06/17 1222 05/06/17 1327   05/06/17 1223  vancomycin (VANCOCIN) 1-5 GM/200ML-% IVPB    Comments:  Pallavi, [place]   : cabinet override      05/06/17 1223 05/07/17 0029   05/06/17 1000  clindamycin (CLEOCIN) IVPB 900 mg  Status:  Discontinued     900 mg 100 mL/hr over 30 Minutes Intravenous To [facility] 05/03/17 0907 05/06/17 1222       Patient was given sequential compression devices and early ambulation to prevent DVT.   Patient benefited maximally from hospital stay and there were no complications. At the time of discharge, the patient was urinating/moving their bowels without difficulty, tolerating a regular diet, pain is controlled with oral pain medications and they have been cleared by PT/OT.   Recent vital signs: Patient Vitals for the past 24 hrs:  BP Temp Temp src Pulse Resp SpO2  05/09/17 0544 118/70 98.9 F (37.2 C) Oral (!) 111 18 92 %  05/08/17 2209 (!) 142/77 99.9 F (37.7 C) Oral (!) 104 18 100 %  05/08/17 1300 139/81 98.7 F (37.1 C) - 74 18 97 %  Recent laboratory studies:  Recent Labs  05/07/17 0321 05/08/17 0337 05/09/17 0421  WBC 17.7* 17.2* 17.2*  HGB 10.6* 10.0* 9.4*  HCT 34.7* 32.1* 29.8*  PLT 378 335 335  NA 136  --   --   K 4.3  --   --   CL 103  --   --   CO2 28  --   --   BUN 13  --   --   CREATININE 0.61  --   --   GLUCOSE 134*  --   --   CALCIUM 8.6*  --   --      Discharge Medications:   Allergies as of 05/09/2017      Reactions   Penicillins Anaphylaxis, Other (See Comments)   Childhood allergy  Has patient had a PCN reaction causing immediate rash, facial/tongue/throat swelling, SOB or  lightheadedness with hypotension: Yes Has patient had a PCN reaction causing severe rash involving mucus membranes or skin necrosis: No Has patient had a PCN reaction that required hospitalization Yes Has patient had a PCN reaction occurring within the last 10 years: No If all of the above answers are "NO", then may proceed with Cephalosporin use.   Clarithromycin Nausea And Vomiting   Moxifloxacin Nausea And Vomiting   Quinolones Nausea And Vomiting   Salicylates Nausea And Vomiting   Lactose Intolerance (gi) Diarrhea   Lactulose Diarrhea   Aspirin Other (See Comments)   Burning of stomach. REACTION: GI upset   Buprenorphine Hcl Itching, Other (See Comments)   Suboxone   Ibuprofen Nausea Only, Other (See Comments)   Severe heartburn, Upset stomach due to acid reflux   Morphine And Related Itching   Sulfa Antibiotics Itching      Medication List    STOP taking these medications   acetaminophen-codeine 300-30 MG tablet Commonly known as:  TYLENOL #3   diphenhydrAMINE 25 MG tablet Commonly known as:  BENADRYL   mupirocin ointment 2 % Commonly known as:  BACTROBAN     TAKE these medications   albuterol (2.5 MG/3ML) 0.083% nebulizer solution Commonly known as:  PROVENTIL Take 2.5 mg by nebulization every 6 (six) hours as needed for wheezing or shortness of breath.   albuterol 90 MCG/ACT inhaler Commonly known as:  PROVENTIL,VENTOLIN Inhale 2 puffs into the lungs every 6 (six) hours as needed for wheezing or shortness of breath. For shortness of breath   DULoxetine 60 MG capsule Commonly known as:  CYMBALTA Take 120 mg by mouth daily.   enoxaparin 30 MG/0.3ML injection Commonly known as:  LOVENOX Inject 0.3 mLs (30 mg total) into the skin every 12 (twelve) hours.   esomeprazole 40 MG capsule Commonly known as:  NEXIUM Take 1 capsule (40 mg total) by mouth 2 (two) times daily before a meal.   gabapentin 300 MG capsule Commonly known as:  NEURONTIN Take 300-600 mg by  mouth See admin instructions. Take 300 mg in the AM and 600 mg at bedtime   LORazepam 1 MG tablet Commonly known as:  ATIVAN Take 1 mg by mouth 3 (three) times daily as needed. for anxiety   mirtazapine 15 MG tablet Commonly known as:  REMERON Take 15 mg by mouth at bedtime as needed (for rest).   nicotine 14 mg/24hr patch Commonly known as:  NICODERM CQ - dosed in mg/24 hours Place 1 patch (14 mg total) onto the skin daily.   oxyCODONE-acetaminophen 5-325 MG tablet Commonly known as:  PERCOCET/ROXICET Take 1-2 tablets by mouth every  4 (four) hours as needed (moderate to severe pain (when tolerating fluids)). What changed:  when to take this       Diagnostic Studies: Dg Chest 2 View  Result Date: 04/30/2017 CLINICAL DATA:  Preoperative examination prior to right knee surgery due to osteoarthritis. History of asthma -COPD, angina pectoris, intermittent smoker for the past 15 years. EXAM: CHEST  2 VIEW COMPARISON:  Chest x-ray of April 22, 2017 FINDINGS: The lungs are adequately inflated. There are coarse lung markings at both bases which are not new. There is no pleural effusion or alveolar infiltrate. The heart and pulmonary vascularity are normal. The mediastinum is normal in width. The trachea is midline. There is a hiatal hernia. There is mild multilevel degenerative disc disease. IMPRESSION: Chronic bronchitic -smoking related changes with bibasilar atelectasis or scarring. No pneumonia nor CHF. Moderate-sized hiatal hernia. Electronically Signed   By: David  Martinique M.D.   On: 04/30/2017 16:07   Dg Knee 1-2 Views Right  Result Date: 05/06/2017 CLINICAL DATA:  56 year old female status post knee arthroplasty. EXAM: RIGHT KNEE - 1-2 VIEW COMPARISON:  [facility] right knee MRI 03/25/2017. Right knee radiographs 12/20/2016. FINDINGS: Portable AP and cross-table lateral views of the right knee at 1630 hours. Sequelae of right total knee arthroplasty. Hardware appears intact and  normally aligned. No unexpected osseous changes. Soft tissue postoperative changes including soft tissue gas and anterior skin staples. IMPRESSION: Status post right total knee arthroplasty with no adverse features. Electronically Signed   By: Genevie Ann M.D.   On: 05/06/2017 17:02   Xr Lumbar Spine 2-3 Views  Result Date: 04/23/2017 AP lateral lumbar spine x-rays obtained that shows 2 level fusion L4-S1 with cages interbody graft. No evidence of loosening good position of graft and screws. Mild narrowing at L3-4 level and other levels show normal disc space height and no spondylolisthesis. Impression: Postop to low cervical fusion L4-S1 with slight narrowing L3-4 without listhesis.      Contact information for follow-up providers    Marybelle Killings, MD. Schedule an appointment as soon as possible for a visit today.   Specialty:  Orthopedic Surgery Why:  needs to be seen 2 weeks postop for recheck. Contact information: [place] [address] [phone]            Contact information for after-discharge care    [facility] Follow up.   Specialty:  [place] information: [address] [place] [phone]                  Discharge Plan:  discharge to snf  Disposition:     Signed: Benjiman Core for Dr Rodell Perna [facility] 05/09/2017, 8:05 AM

## 2017-05-09 NOTE — Social Work (Signed)
Clinical Social Worker facilitated patient discharge including contacting patient family and facility to confirm patient discharge plans.  Clinical information faxed to facility and family agreeable with plan.    CSW arranged ambulance transport via [facility] to [facility].    RN to call [phone] to give report prior to discharge.   Clinical Social Worker will sign off for now as social work intervention is no longer needed. Please consult us again if new need arises.  Marrianne Sica, LCSW Clinical Social Worker [phone]    

## 2017-05-09 NOTE — Discharge Instructions (Signed)
INSTRUCTIONS AFTER JOINT REPLACEMENT   o Remove items at home which could result in a fall. This includes throw rugs or furniture in walking pathways o ICE to the affected joint every three hours while awake for 30 minutes at a time, for at least the first 3-5 days, and then as needed for pain and swelling.  Continue to use ice for pain and swelling. You may notice swelling that will progress down to the foot and ankle.  This is normal after surgery.  Elevate your leg when you are not up walking on it.   o Continue to use the breathing machine you got in the hospital (incentive spirometer) which will help keep your temperature down.  It is common for your temperature to cycle up and down following surgery, especially at night when you are not up moving around and exerting yourself.  The breathing machine keeps your lungs expanded and your temperature down.   DIET:  As you were doing prior to hospitalization, we recommend a well-balanced diet.  DRESSING / WOUND CARE / SHOWERING  Ok to shower 5 days postop.  No tub soaking.  Do not apply any creams or ointments to incision.  Dry dressing changes with gauze and tape.    ACTIVITY  o Increase activity slowly as tolerated, but follow the weight bearing instructions below.   o No driving for 6 weeks or until further direction given by your physician.  You cannot drive while taking narcotics.  o No lifting or carrying greater than 10 lbs. until further directed by your surgeon. o Avoid periods of inactivity such as sitting longer than an hour when not asleep. This helps prevent blood clots.  o You may return to work once you are authorized by your doctor.     WEIGHT BEARING   Weight bearing as tolerated with assist device (walker, cane, etc) as directed, use it as long as suggested by your surgeon or therapist, typically at least 4-6 weeks.   EXERCISES  Results after joint replacement surgery are often greatly improved when you follow the  exercise, range of motion and muscle strengthening exercises prescribed by your doctor. Safety measures are also important to protect the joint from further injury. Any time any of these exercises cause you to have increased pain or swelling, decrease what you are doing until you are comfortable again and then slowly increase them. If you have problems or questions, call your caregiver or physical therapist for advice.   Rehabilitation is important following a joint replacement. After just a few days of immobilization, the muscles of the leg can become weakened and shrink (atrophy).  These exercises are designed to build up the tone and strength of the thigh and leg muscles and to improve motion. Often times heat used for twenty to thirty minutes before working out will loosen up your tissues and help with improving the range of motion but do not use heat for the first two weeks following surgery (sometimes heat can increase post-operative swelling).   These exercises can be done on a training (exercise) mat, on the floor, on a table or on a bed. Use whatever works the best and is most comfortable for you.    Use music or television while you are exercising so that the exercises are a pleasant break in your day. This will make your life better with the exercises acting as a break in your routine that you can look forward to.   Perform all exercises about fifteen  times, three times per day or as directed.  You should exercise both the operative leg and the other leg as well.  Exercises include:    Quad Sets - Tighten up the muscle on the front of the thigh (Quad) and hold for 5-10 seconds.    Straight Leg Raises - With your knee straight (if you were given a brace, keep it on), lift the leg to 60 degrees, hold for 3 seconds, and slowly lower the leg.  Perform this exercise against resistance later as your leg gets stronger.   Leg Slides: Lying on your back, slowly slide your foot toward your buttocks,  bending your knee up off the floor (only go as far as is comfortable). Then slowly slide your foot back down until your leg is flat on the floor again.   Angel Wings: Lying on your back spread your legs to the side as far apart as you can without causing discomfort.   Hamstring Strength:  Lying on your back, push your heel against the floor with your leg straight by tightening up the muscles of your buttocks.  Repeat, but this time bend your knee to a comfortable angle, and push your heel against the floor.  You may put a pillow under the heel to make it more comfortable if necessary.   A rehabilitation program following joint replacement surgery can speed recovery and prevent re-injury in the future due to weakened muscles. Contact your doctor or a physical therapist for more information on knee rehabilitation.    CONSTIPATION  Constipation is defined medically as fewer than three stools per week and severe constipation as less than one stool per week.  Even if you have a regular bowel pattern at home, your normal regimen is likely to be disrupted due to multiple reasons following surgery.  Combination of anesthesia, postoperative narcotics, change in appetite and fluid intake all can affect your bowels.   YOU MUST use at least one of the following options; they are listed in order of increasing strength to get the job done.  They are all available over the counter, and you may need to use some, POSSIBLY even all of these options:    Drink plenty of fluids (prune juice may be helpful) and high fiber foods Colace 100 mg by mouth twice a day  Senokot for constipation as directed and as needed Dulcolax (bisacodyl), take with full glass of water  Miralax (polyethylene glycol) once or twice a day as needed.  If you have tried all these things and are unable to have a bowel movement in the first 3-4 days after surgery call either your surgeon or your primary doctor.    If you experience loose stools  or diarrhea, hold the medications until you stool forms back up.  If your symptoms do not get better within 1 week or if they get worse, check with your doctor.  If you experience "the worst abdominal pain ever" or develop nausea or vomiting, please contact the office immediately for further recommendations for treatment.   ITCHING:  If you experience itching with your medications, try taking only a single pain pill, or even half a pain pill at a time.  You can also use Benadryl over the counter for itching or also to help with sleep.   TED HOSE STOCKINGS:  Use stockings on both legs until for at least 2 weeks or as directed by physician office. They may be removed at night for sleeping.  MEDICATIONS:  See  your medication summary on the After Visit Summary that nursing will review with you.  You may have some home medications which will be placed on hold until you complete the course of blood thinner medication.  It is important for you to complete the blood thinner medication as prescribed.  PRECAUTIONS:  If you experience chest pain or shortness of breath - call 911 immediately for transfer to the hospital emergency department.   If you develop a fever greater that 101 F, purulent drainage from wound, increased redness or drainage from wound, foul odor from the wound/dressing, or calf pain - CONTACT YOUR SURGEON.                                                   FOLLOW-UP APPOINTMENTS:  If you do not already have a post-op appointment, please call the office for an appointment to be seen by your surgeon.  Guidelines for how soon to be seen are listed in your After Visit Summary, but are typically between 1-4 weeks after surgery.  OTHER INSTRUCTIONS:   Knee Replacement:  Do not place pillow under knee, focus on keeping the knee straight while resting. CPM instructions: 0-90 degrees, 2 hours in the morning, 2 hours in the afternoon, and 2 hours in the evening. Place foam block, curve side up  under heel at all times except when in CPM or when walking.  DO NOT modify, tear, cut, or change the foam block in any way.  MAKE SURE YOU:   Understand these instructions.   Get help right away if you are not doing well or get worse.    Thank you for letting us be a part of your medical care team.  It is a privilege we respect greatly.  We hope these instructions will help you stay on track for a fast and full recovery!

## 2017-05-09 NOTE — Progress Notes (Signed)
Called report to [facility] healthcare.

## 2017-05-09 NOTE — Progress Notes (Signed)
Physical Therapy Treatment Patient Details Name: [name] MRN: [id number] DOB: 12-Jun-1961 Today's Date: 05/09/2017    History of Present Illness Pt is a 56 yo female who was admitted for elective R TKA. PMH: depression, anxiety, low back pain, IBS, and ADHD. Pt reports having PTSD from childhood and incident 15 yrs ago PSH: bowel resection. back surgery, neck surgery, umbilical hernia repair.    PT Comments    Today's skilled session focused on supine HEP. Pt's SpO2 remained <89 during ther ex while on RA. Pt required min A to progress EOB, once seated at EOB pt reported she could not do any more. PTA encouraged pt to continue with therapy. While PTA was donning pt's socks, pt laid down in bed and stated "i'm not helping you any more". Pt is not progressing towards goals at this time and seems confused. She cannot follow multi-step commands and answers questions inappropriately. Family not present to determine if current level of cognition is baseline. Pt should benefit from continued skilled PT to increase functional independence. Will continue to follow acutely.      Follow Up Recommendations  SNF;Supervision/Assistance - 24 hour     Equipment Recommendations  None recommended by PT (TBD at next venue)    Recommendations for Other Services       Precautions / Restrictions Precautions Precautions: Fall Precaution Comments: reviewed proper knee positioning Required Braces or Orthoses: Knee Immobilizer - Right Knee Immobilizer - Right: Other (comment) (on except in CPM and with PT) Restrictions Weight Bearing Restrictions: Yes RLE Weight Bearing: Weight bearing as tolerated    Mobility  Bed Mobility Overal bed mobility: Needs Assistance Bed Mobility: Supine to Sit;Sit to Supine     Supine to sit: Min assist Sit to supine: Min guard   General bed mobility comments: Min A to progress LE off of bed. Pt Min guard to return to supine.  Transfers                  General transfer comment: pt refused to transfer stating secondary to pain. Layed down in bed when PTA was attempting to don pt's socks.   Ambulation/Gait                 Stairs            Wheelchair Mobility    Modified Rankin (Stroke Patients Only)       Balance Overall balance assessment: Needs assistance Sitting-balance support: Feet supported;No upper extremity supported Sitting balance-Leahy Scale: Fair     Standing balance support: During functional activity;Bilateral upper extremity supported Standing balance-Leahy Scale: Poor Standing balance comment: reliant on UE support                            Cognition Arousal/Alertness: Lethargic;Suspect due to medications Behavior During Therapy: Agitated Overall Cognitive Status: No family/caregiver present to determine baseline cognitive functioning                                 General Comments: Pt confused and trying to drink out of the water pitcher, aggitated that it doesn't work. Unable to follow multistep commands. Answers questions inappropriately      Exercises Total Joint Exercises Ankle Circles/Pumps: AROM;Both;10 reps;Supine Quad Sets: AROM;Right;Supine;5 reps (no evidence of quad activation) Towel Squeeze: AROM;Both;5 reps;Supine Short Arc Quad: AROM;Right;5 reps;Supine Heel Slides: AAROM;Right;Supine Hip ABduction/ADduction: AAROM;Right;5 reps;Supine Straight  Leg Raises: AAROM;Right;5 reps;Supine    General Comments        Pertinent Vitals/Pain Pain Assessment: 0-10 Pain Score: 8  Pain Location: R knee Pain Descriptors / Indicators: Aching;Sore Pain Intervention(s): Limited activity within patient's tolerance;Monitored during session;Patient requesting pain meds-RN notified    Home Living                      Prior Function            PT Goals (current goals can now be found in the care plan section) Acute Rehab PT Goals Patient Stated  Goal: to rehab then home PT Goal Formulation: With patient Time For Goal Achievement: 05/14/17 Potential to Achieve Goals: Fair Progress towards PT goals: Not progressing toward goals - comment (lethargy, pain, and confusion limiting session)    Frequency    7X/week      PT Plan Current plan remains appropriate    Co-evaluation              AM-PAC PT "6 Clicks" Daily Activity  Outcome Measure  Difficulty turning over in bed (including adjusting bedclothes, sheets and blankets)?: Total Difficulty moving from lying on back to sitting on the side of the bed? : Total Difficulty sitting down on and standing up from a chair with arms (e.g., wheelchair, bedside commode, etc,.)?: A Little Help needed moving to and from a bed to chair (including a wheelchair)?: A Little Help needed walking in hospital room?: A Little Help needed climbing 3-5 steps with a railing? : A Lot 6 Click Score: 13    End of Session   Activity Tolerance: Treatment limited secondary to agitation;Patient limited by lethargy;Patient limited by pain Patient left: with call bell/phone within reach;in bed;with bed alarm set Nurse Communication: Mobility status PT Visit Diagnosis: Difficulty in walking, not elsewhere classified (R26.2);Pain Pain - Right/Left: Right Pain - part of body: Knee     Time: 1106-1130 PT Time Calculation (min) (ACUTE ONLY): 24 min  Charges:  $Therapeutic Exercise: 8-22 mins $Therapeutic Activity: 8-22 mins                    G Codes:       [name], [place] Pager [phone] Acute Rehab   [name] 05/09/2017, 11:44 AM

## 2017-05-16 ENCOUNTER — Ambulatory Visit (INDEPENDENT_AMBULATORY_CARE_PROVIDER_SITE_OTHER): Payer: Medicare Other | Admitting: Specialist

## 2017-05-16 ENCOUNTER — Ambulatory Visit (INDEPENDENT_AMBULATORY_CARE_PROVIDER_SITE_OTHER): Payer: Medicare Other | Admitting: Orthopaedic Surgery

## 2017-05-16 ENCOUNTER — Encounter (INDEPENDENT_AMBULATORY_CARE_PROVIDER_SITE_OTHER): Payer: Self-pay | Admitting: Orthopaedic Surgery

## 2017-05-16 DIAGNOSIS — Z96651 Presence of right artificial knee joint: Secondary | ICD-10-CM

## 2017-05-16 NOTE — Progress Notes (Signed)
Post-Op Visit Note   Patient: [name]           Date of Birth: 08-28-1961           MRN: [id number] Visit Date: 05/16/2017 PCP: Elwyn Reach, MD   Assessment & Plan:  Chief Complaint: No chief complaint on file. Patient doing well. Feels like she is making progress with rehabilitation. Visit Diagnoses:  1. Status post total right knee replacement     Plan: [facility] skilled facility brought patient back for an earlier appointment than I had wanted. We'll need see patient next Tuesday for staple removal. Continue PT per protocol.  Follow-Up Instructions: Return in about 5 days (around 05/21/2017).   Orders:  No orders of the defined types were placed in this encounter.  No orders of the defined types were placed in this encounter.   Imaging: No results found.  PMFS History: Patient Active Problem List   Diagnosis Date Noted  . Arthritis of right knee 05/06/2017  . Swelling of right lower extremity 04/23/2017  . Unilateral primary osteoarthritis, right knee 03/28/2017  . Acute pain of right knee 01/13/2017  . Left genital labial abscess 10/17/2016  . Labial abscess 10/17/2016  . Anxiety state 03/15/2015  . Anemia 03/14/2015  . COPD (chronic obstructive pulmonary disease) ([place]) 03/13/2015  . Ileus ([place]) 04/18/2014  . Tobacco abuse 04/18/2014  . Hx of adenomatous colonic polyps 09/07/2013  . Muscle spasms of neck 06/09/2013  . Cervical pain 03/17/2013  . C. difficile colitis 06/25/2011  . Chronic nausea 03/13/2011  . DEPRESSION 05/09/2007  . ALLERGIC RHINITIS 05/09/2007  . ASTHMA 05/09/2007  . GERD 05/09/2007  . PEPTIC ULCER DISEASE 05/09/2007  . IBS 05/09/2007  . ARTHRITIS 05/09/2007  . HIP PAIN, LEFT 05/09/2007  . NECK PAIN, CHRONIC 05/09/2007  . LOW BACK PAIN 05/09/2007  . CONSTIPATION, HX OF 05/09/2007  . HX, PERSONAL, PAST NONCOMPLIANCE 05/09/2007   Past Medical History:  Diagnosis Date  . ADHD (attention deficit hyperactivity  disorder)   . Anginal pain ([place])   . Asthma   . BMI (body mass index) 20.0-29.9 2009 127 LBS  . Chronic abdominal pain 2003   EX LAP APR 2004 RUPTURE L OV CYST, JUL 2004 ADHESIONS  . Chronic nausea   . Clostridium difficile colitis 04/10/11 &11/2011   tx w/ flagyl  . COPD (chronic obstructive pulmonary disease) ([place])   . Degenerative disc disease   . Depression   . GERD (gastroesophageal reflux disease)   . Hiatal hernia 2008   on EGD above  . Irritable bowel syndrome 2004 CONSTIPATION  . Migraines   . MRSA infection    Dr. [name], currently under treatment  . PTSD (post-traumatic stress disorder)   . PUD (peptic ulcer disease) 01/2007   EGD Dr [name], 2 antral ulcers, negative h pylori  . Rectal prolapse   . S/P colonoscopy 06/02/09   normal ([name])  . S/P endoscopy 03/26/11   gastritis-Dr [name]  . SBO (small bowel obstruction) ([facility])     Family History  Problem Relation Age of Onset  . Adopted: Yes    Past Surgical History:  Procedure Laterality Date  . ABDOMINAL HYSTERECTOMY    . APPENDECTOMY    . BACK SURGERY    . BOWEL RESECTION     x2, secondary to adhesions  . CERVICAL SPINE SURGERY    . COLONOSCOPY  03/21/2012    YWV:PXTGGY ADENOMA(1) POOR PREP  . COLONOSCOPY N/A 11/17/2013  HER:DEYCXK mucosa in the terminal ileum/NORMAL surgical anastomosis/Small internal hemorrhoids  . ESOPHAGOGASTRODUODENOSCOPY  03/23/2011    Normal esophagus without evidence of Barrett's, mass, erosions, or ulcerations./ Patchy erythema with occasional erosion in the antrum.  Biopsies  obtained via cold forceps to evaluate for H. pylori gastritis/ Small hiatal hernia./Normal duodenal bulb and second portion of the duodenum.  . ESOPHAGOGASTRODUODENOSCOPY  03/21/2012   GYJ:EHUDJ Hiatal hernia/ABDOMINALPAIN/DIARRHEA MOST LIKELY DUE TO IBS, GASTRITIS DUODENITIS  . FLEXIBLE SIGMOIDOSCOPY  07/14/2002   Normal limited flexible sigmoidoscopy with stool in the rectum and rectosigmoid  precluding a full colonoscopy  . JOINT REPLACEMENT    . LAPAROSCOPIC LYSIS INTESTINAL ADHESIONS    . NECK SURGERY  2009  . PARTIAL HYSTERECTOMY    . 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  . 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   Procedure: RIGHT TOTAL KNEE ARTHROPLASTY;  Surgeon: Marybelle Killings, MD;  Location: [facility];  Service: Orthopedics;  Laterality: Right;  . TUBAL LIGATION    . UMBILICAL HERNIA REPAIR  2008   x5  . UPPER GASTROINTESTINAL ENDOSCOPY  APR 2008 [facility] BLEEDING/PAIN   PUD  . UPPER GASTROINTESTINAL ENDOSCOPY  JUL 2012 [facility] PAIN   MILD GASTRITIS   Social History   Occupational History  . disabled    Social History Main Topics  . Smoking status: Current Every Day Smoker    Packs/day: 0.50    Years: 10.00    Types: Cigarettes  . Smokeless tobacco: Never Used  . Alcohol use Yes     Comment: 05/07/2017 "quit when I was 19; drank for 1 year total"  . Drug use: No  . Sexual activity: No   Exam Wound looks good and staples intact. No drainage or signs of infection. Calf nontender.

## 2017-05-21 ENCOUNTER — Ambulatory Visit (INDEPENDENT_AMBULATORY_CARE_PROVIDER_SITE_OTHER): Payer: Medicare Other

## 2017-05-21 ENCOUNTER — Ambulatory Visit (INDEPENDENT_AMBULATORY_CARE_PROVIDER_SITE_OTHER): Payer: Medicare Other | Admitting: Surgery

## 2017-05-21 ENCOUNTER — Encounter (INDEPENDENT_AMBULATORY_CARE_PROVIDER_SITE_OTHER): Payer: Self-pay | Admitting: Surgery

## 2017-05-21 DIAGNOSIS — M1711 Unilateral primary osteoarthritis, right knee: Secondary | ICD-10-CM

## 2017-05-30 ENCOUNTER — Ambulatory Visit (INDEPENDENT_AMBULATORY_CARE_PROVIDER_SITE_OTHER): Payer: Medicare Other | Admitting: Orthopaedic Surgery

## 2017-05-30 ENCOUNTER — Encounter (INDEPENDENT_AMBULATORY_CARE_PROVIDER_SITE_OTHER): Payer: Self-pay | Admitting: Orthopaedic Surgery

## 2017-05-30 ENCOUNTER — Telehealth (INDEPENDENT_AMBULATORY_CARE_PROVIDER_SITE_OTHER): Payer: Self-pay | Admitting: Orthopaedic Surgery

## 2017-05-30 VITALS — BP 113/75 | HR 92 | Temp 99.3°F

## 2017-05-30 DIAGNOSIS — Z96651 Presence of right artificial knee joint: Secondary | ICD-10-CM

## 2017-05-30 MED ORDER — OXYCODONE-ACETAMINOPHEN 5-325 MG PO TABS: 1.0000 | ORAL_TABLET | Freq: Four times a day (QID) | ORAL | 0 refills | Status: DC | PRN

## 2017-05-30 NOTE — Telephone Encounter (Signed)
[place]

## 2017-05-30 NOTE — Telephone Encounter (Signed)
[name] (OT) with [facility] called needing verbal orders for [facility] (OT) 1 wk 2 The number to contact [facility] is [phone]

## 2017-05-30 NOTE — Addendum Note (Signed)
Addended by: Meyer Cory on: 05/30/2017 02:15 PM   Modules accepted: Orders

## 2017-05-30 NOTE — Progress Notes (Signed)
Post-Op Visit Note   Patient: [name]           Date of Birth: [birth year]           MRN: [id number] Visit Date: 05/30/2017 PCP: Elwyn Reach, MD   Assessment & Plan: Post right total knee arthroplasty 05/06/2017 she has a 30 extension lag. She has trouble getting full extension we gutter within 5 of full extension after several minutes of what she felt was torture. We discussed she needs to work on extension needs to work on Astronomer. She has low-grade temp 99 3.  Chief Complaint:  Chief Complaint  Patient presents with  . Right Knee - Pain   Visit Diagnoses: Status post right total knee arthroplasty.  Plan: We discussed she needs to work hard on quad strength and also on extension. This is been much more difficult than she had expected and she is having difficulty dealing with the pain since she had multiple procedures in the past and the amount of effort used to get her knee in good position is been somewhat overwhelming. We discussed multiple exercises for her to work on on her own.  Follow-Up Instructions: Return in about 3 weeks (around 06/20/2017).   Orders:  No orders of the defined types were placed in this encounter.  No orders of the defined types were placed in this encounter.   Imaging: No results found.  PMFS History: Patient Active Problem List   Diagnosis Date Noted  . Arthritis of right knee 05/06/2017  . Swelling of right lower extremity 04/23/2017  . Unilateral primary osteoarthritis, right knee 03/28/2017  . Acute pain of right knee 01/13/2017  . Left genital labial abscess 10/17/2016  . Labial abscess 10/17/2016  . Anxiety state 03/15/2015  . Anemia 03/14/2015  . COPD (chronic obstructive pulmonary disease) ([place]) 03/13/2015  . Ileus ([place]) 04/18/2014  . Tobacco abuse 04/18/2014  . Hx of adenomatous colonic polyps 09/07/2013  . Muscle spasms of neck 06/09/2013  . Cervical pain 03/17/2013  . C. difficile colitis 06/25/2011  .  Chronic nausea 03/13/2011  . DEPRESSION 05/09/2007  . ALLERGIC RHINITIS 05/09/2007  . ASTHMA 05/09/2007  . GERD 05/09/2007  . PEPTIC ULCER DISEASE 05/09/2007  . IBS 05/09/2007  . ARTHRITIS 05/09/2007  . HIP PAIN, LEFT 05/09/2007  . NECK PAIN, CHRONIC 05/09/2007  . LOW BACK PAIN 05/09/2007  . CONSTIPATION, HX OF 05/09/2007  . HX, PERSONAL, PAST NONCOMPLIANCE 05/09/2007   Past Medical History:  Diagnosis Date  . ADHD (attention deficit hyperactivity disorder)   . Anginal pain ([place])   . Asthma   . BMI (body mass index) 20.0-29.9 2009 127 LBS  . Chronic abdominal pain 2003   EX LAP APR 2004 RUPTURE L OV CYST, JUL 2004 ADHESIONS  . Chronic nausea   . Clostridium difficile colitis 04/10/11 &11/2011   tx w/ flagyl  . COPD (chronic obstructive pulmonary disease) ([place])   . Degenerative disc disease   . Depression   . GERD (gastroesophageal reflux disease)   . Hiatal hernia 2008   on EGD above  . Irritable bowel syndrome 2004 CONSTIPATION  . Migraines   . MRSA infection    Dr. [name], currently under treatment  . PTSD (post-traumatic stress disorder)   . PUD (peptic ulcer disease) 01/2007   EGD Dr [name], 2 antral ulcers, negative h pylori  . Rectal prolapse   . S/P colonoscopy 06/02/09   normal ([name])  . S/P endoscopy 03/26/11   gastritis-Dr  Fields  . SBO (small bowel obstruction) ([facility])     Family History  Problem Relation Age of Onset  . Adopted: Yes    Past Surgical History:  Procedure Laterality Date  . ABDOMINAL HYSTERECTOMY    . APPENDECTOMY    . BACK SURGERY    . BOWEL RESECTION     x2, secondary to adhesions  . CERVICAL SPINE SURGERY    . COLONOSCOPY  03/21/2012    WNI:OEVOJJ ADENOMA(1) POOR PREP  . COLONOSCOPY N/A 11/17/2013   KKX:FGHWEX mucosa in the terminal ileum/NORMAL surgical anastomosis/Small internal hemorrhoids  . ESOPHAGOGASTRODUODENOSCOPY  03/23/2011    Normal esophagus without evidence of Barrett's, mass, erosions, or ulcerations./  Patchy erythema with occasional erosion in the antrum.  Biopsies  obtained via cold forceps to evaluate for H. pylori gastritis/ Small hiatal hernia./Normal duodenal bulb and second portion of the duodenum.  . ESOPHAGOGASTRODUODENOSCOPY  03/21/2012   HBZ:JIRCV Hiatal hernia/ABDOMINALPAIN/DIARRHEA MOST LIKELY DUE TO IBS, GASTRITIS DUODENITIS  . FLEXIBLE SIGMOIDOSCOPY  07/14/2002   Normal limited flexible sigmoidoscopy with stool in the rectum and rectosigmoid precluding a full colonoscopy  . JOINT REPLACEMENT    . LAPAROSCOPIC LYSIS INTESTINAL ADHESIONS    . NECK SURGERY  2009  . PARTIAL HYSTERECTOMY    . 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  . 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   Procedure: RIGHT TOTAL KNEE ARTHROPLASTY;  Surgeon: Marybelle Killings, MD;  Location: [facility];  Service: Orthopedics;  Laterality: Right;  . TUBAL LIGATION    . UMBILICAL HERNIA REPAIR  2008   x5  . UPPER GASTROINTESTINAL ENDOSCOPY  APR 2008 RMR BLEEDING/PAIN   PUD  . UPPER GASTROINTESTINAL ENDOSCOPY  JUL 2012 [facility]F PAIN   MILD GASTRITIS   Social History   Occupational History  . disabled    Social History Main Topics  . Smoking status: Current Every Day Smoker    Packs/day: 0.50    Years: 10.00    Types: Cigarettes  . Smokeless tobacco: Never Used  . Alcohol use Yes     Comment: 05/07/2017 "quit when I was 19; drank for 1 year total"  . Drug use: No  . Sexual activity: No

## 2017-05-30 NOTE — Telephone Encounter (Signed)
I left voicemail for [facility] advising.

## 2017-05-30 NOTE — Telephone Encounter (Signed)
Ok for orders? 

## 2017-06-03 ENCOUNTER — Telehealth (INDEPENDENT_AMBULATORY_CARE_PROVIDER_SITE_OTHER): Payer: Self-pay | Admitting: Orthopaedic Surgery

## 2017-06-03 NOTE — Telephone Encounter (Signed)
Please advise 

## 2017-06-03 NOTE — Telephone Encounter (Signed)
Clare Gandy (PT) with [facility]. Tom advised patient received a call to make an appointment with [facility] out patient (PT) Gershon Mussel advised  He is doing HH (PT) and is going to see patient for 2 more weeks. Tom want to know what Dr Lorin Mercy want for the patient. The number to contact Gershon Mussel is [phone]

## 2017-06-04 ENCOUNTER — Telehealth (INDEPENDENT_AMBULATORY_CARE_PROVIDER_SITE_OTHER): Payer: Self-pay

## 2017-06-04 NOTE — Telephone Encounter (Signed)
I called no answer

## 2017-06-04 NOTE — Telephone Encounter (Signed)
[name] PT calling asking for an extension on her HHPT 2 times a week for a week 1 time a week for a week Please call her and let her know [phone]

## 2017-06-05 ENCOUNTER — Telehealth (INDEPENDENT_AMBULATORY_CARE_PROVIDER_SITE_OTHER): Payer: Self-pay

## 2017-06-05 NOTE — Telephone Encounter (Signed)
Please advise 

## 2017-06-05 NOTE — Telephone Encounter (Signed)
I called discussed with her. Patient is taking pain medication she refused therapy recently she is only flexing to 50 and needs to work on flexion and quad strengthening to help her pain.

## 2017-06-05 NOTE — Telephone Encounter (Signed)
[name] PT stated that patient is running a low grade fever on/off.  Patient is in a lot of pain and trembling.  PT saw patient on the 22nd of August and the 28th of August and stated that patient has not improved.  Therapist's believes that patient may have something else going on.  CB# is [phone].  Please advise.

## 2017-06-14 ENCOUNTER — Telehealth (INDEPENDENT_AMBULATORY_CARE_PROVIDER_SITE_OTHER): Payer: Self-pay | Admitting: Radiology

## 2017-06-14 MED ORDER — ACETAMINOPHEN-CODEINE #3 300-30 MG PO TABS: 1.0000 | ORAL_TABLET | Freq: Three times a day (TID) | ORAL | 0 refills | Status: DC | PRN

## 2017-06-14 NOTE — Telephone Encounter (Signed)
Please advise 

## 2017-06-14 NOTE — Addendum Note (Signed)
Addended by: Meyer Cory on: 06/14/2017 05:22 PM   Modules accepted: Orders

## 2017-06-14 NOTE — Telephone Encounter (Signed)
Clare Gandy from [facility] called states that patient is having 8 out of 10 pain.  He states that pt got some pain meds from her PCP that made her sick.  She has stopped them and is taking tylenol and it is not helping her at all.  She is s/p TKA

## 2017-06-14 NOTE — Telephone Encounter (Signed)
Called to pharmacy 

## 2017-06-14 NOTE — Telephone Encounter (Signed)
Call in  20 tablets of Tylenol No. 3.  One po tid prn pain

## 2017-06-18 ENCOUNTER — Ambulatory Visit (INDEPENDENT_AMBULATORY_CARE_PROVIDER_SITE_OTHER): Payer: Medicare Other | Admitting: Orthopaedic Surgery

## 2017-06-27 ENCOUNTER — Ambulatory Visit (INDEPENDENT_AMBULATORY_CARE_PROVIDER_SITE_OTHER): Payer: Medicare Other | Admitting: Orthopaedic Surgery

## 2017-06-27 ENCOUNTER — Encounter (INDEPENDENT_AMBULATORY_CARE_PROVIDER_SITE_OTHER): Payer: Self-pay | Admitting: Orthopaedic Surgery

## 2017-06-27 VITALS — BP 102/63 | HR 103

## 2017-06-27 DIAGNOSIS — Z96651 Presence of right artificial knee joint: Secondary | ICD-10-CM

## 2017-06-27 NOTE — Addendum Note (Signed)
Addended by: Meyer Cory on: 06/27/2017 02:02 PM   Modules accepted: Orders

## 2017-06-27 NOTE — Progress Notes (Signed)
Post-Op Visit Note   Patient: [name]           Date of Birth: [birth year]           MRN: [id number] Visit Date: 06/27/2017 PCP: Elwyn Reach, MD   Assessment & Plan: Post right total knee arthroplasty. She has excellent flexion to under 20 she still lacks a little less than 10 reaching full extension and we discussed prone positioning with the weight on her ankle to achieve full extension. Her quad strength is improving she is ambulating with a single-point cane. She is off her pain medication is still using some ice intermittently. She also uses some Tylenol.  Chief Complaint:  Chief Complaint  Patient presents with  . Right Knee - Follow-up   Visit Diagnoses:  1. S/P total knee arthroplasty, right     Plan: Continue strengthening continued cane until her quad is strong enough to work on full extension I'll recheck her in a month she is off her pain medication and is making good progress.  Follow-Up Instructions: Return in about 1 month (around 07/27/2017).   Orders:  No orders of the defined types were placed in this encounter.  No orders of the defined types were placed in this encounter.   Imaging: No results found.  PMFS History: Patient Active Problem List   Diagnosis Date Noted  . Arthritis of right knee 05/06/2017  . Swelling of right lower extremity 04/23/2017  . Acute pain of right knee 01/13/2017  . Left genital labial abscess 10/17/2016  . Labial abscess 10/17/2016  . Anxiety state 03/15/2015  . Anemia 03/14/2015  . COPD (chronic obstructive pulmonary disease) ([place]) 03/13/2015  . Ileus ([place]) 04/18/2014  . Tobacco abuse 04/18/2014  . Hx of adenomatous colonic polyps 09/07/2013  . Muscle spasms of neck 06/09/2013  . Cervical pain 03/17/2013  . C. difficile colitis 06/25/2011  . Chronic nausea 03/13/2011  . DEPRESSION 05/09/2007  . ALLERGIC RHINITIS 05/09/2007  . ASTHMA 05/09/2007  . GERD 05/09/2007  . PEPTIC ULCER DISEASE 05/09/2007    . IBS 05/09/2007  . ARTHRITIS 05/09/2007  . HIP PAIN, LEFT 05/09/2007  . NECK PAIN, CHRONIC 05/09/2007  . LOW BACK PAIN 05/09/2007  . CONSTIPATION, HX OF 05/09/2007  . HX, PERSONAL, PAST NONCOMPLIANCE 05/09/2007   Past Medical History:  Diagnosis Date  . ADHD (attention deficit hyperactivity disorder)   . Anginal pain ([place])   . Asthma   . BMI (body mass index) 20.0-29.9 2009 127 LBS  . Chronic abdominal pain 2003   EX LAP APR 2004 RUPTURE L OV CYST, JUL 2004 ADHESIONS  . Chronic nausea   . Clostridium difficile colitis 04/10/11 &11/2011   tx w/ flagyl  . COPD (chronic obstructive pulmonary disease) ([place])   . Degenerative disc disease   . Depression   . GERD (gastroesophageal reflux disease)   . Hiatal hernia 2008   on EGD above  . Irritable bowel syndrome 2004 CONSTIPATION  . Migraines   . MRSA infection    Dr. [name], currently under treatment  . PTSD (post-traumatic stress disorder)   . PUD (peptic ulcer disease) 01/2007   EGD Dr [name], 2 antral ulcers, negative h pylori  . Rectal prolapse   . S/P colonoscopy 06/02/09   normal ([name])  . S/P endoscopy 03/26/11   gastritis-Dr [name]  . SBO (small bowel obstruction) ([facility])     Family History  Problem Relation Age of Onset  . Adopted: Yes    Past  Surgical History:  Procedure Laterality Date  . ABDOMINAL HYSTERECTOMY    . APPENDECTOMY    . BACK SURGERY    . BOWEL RESECTION     x2, secondary to adhesions  . CERVICAL SPINE SURGERY    . COLONOSCOPY  03/21/2012    OTR:RNHAFB ADENOMA(1) POOR PREP  . COLONOSCOPY N/A 11/17/2013   XUX:YBFXOV mucosa in the terminal ileum/NORMAL surgical anastomosis/Small internal hemorrhoids  . ESOPHAGOGASTRODUODENOSCOPY  03/23/2011    Normal esophagus without evidence of Barrett's, mass, erosions, or ulcerations./ Patchy erythema with occasional erosion in the antrum.  Biopsies  obtained via cold forceps to evaluate for H. pylori gastritis/ Small hiatal hernia./Normal duodenal  bulb and second portion of the duodenum.  . ESOPHAGOGASTRODUODENOSCOPY  03/21/2012   ANV:BTYOM Hiatal hernia/ABDOMINALPAIN/DIARRHEA MOST LIKELY DUE TO IBS, GASTRITIS DUODENITIS  . FLEXIBLE SIGMOIDOSCOPY  07/14/2002   Normal limited flexible sigmoidoscopy with stool in the rectum and rectosigmoid precluding a full colonoscopy  . JOINT REPLACEMENT    . LAPAROSCOPIC LYSIS INTESTINAL ADHESIONS    . NECK SURGERY  2009  . PARTIAL HYSTERECTOMY    . 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  . 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   Procedure: RIGHT TOTAL KNEE ARTHROPLASTY;  Surgeon: Marybelle Killings, MD;  Location: [facility];  Service: Orthopedics;  Laterality: Right;  . TUBAL LIGATION    . UMBILICAL HERNIA REPAIR  2008   x5  . UPPER GASTROINTESTINAL ENDOSCOPY  APR 2008 RMR BLEEDING/PAIN   PUD  . UPPER GASTROINTESTINAL ENDOSCOPY  JUL 2012 [facility]F PAIN   MILD GASTRITIS   Social History   Occupational History  . disabled    Social History Main Topics  . Smoking status: Current Every Day Smoker    Packs/day: 0.50    Years: 10.00    Types: Cigarettes  . Smokeless tobacco: Never Used  . Alcohol use Yes     Comment: 05/07/2017 "quit when I was 19; drank for 1 year total"  . Drug use: No  . Sexual activity: No

## 2017-07-02 NOTE — Telephone Encounter (Signed)
error 

## 2017-08-01 ENCOUNTER — Telehealth (INDEPENDENT_AMBULATORY_CARE_PROVIDER_SITE_OTHER): Payer: Self-pay | Admitting: Radiology

## 2017-08-01 NOTE — Telephone Encounter (Signed)
Patient had Total Knee Replacement 05/06/17  Alisha from [facility] called in reference to pre-meds or antibiotics for any dental work for patient. If so what does Dr. Lorin Mercy prefer patient to have.  Call back # [phone] Fax # [phone]

## 2017-08-01 NOTE — Telephone Encounter (Signed)
No premed needed.  Faxed info to number provided.

## 2017-09-19 ENCOUNTER — Ambulatory Visit: Payer: Medicare Other | Admitting: Family Medicine

## 2017-10-04 NOTE — Progress Notes (Signed)
Post-Op Visit Note   Patient: [name]           Date of Birth: 01/16/1961           MRN: [id number] Visit Date: 05/21/2017 PCP: Elwyn Reach, MD   Assessment & Plan:  Chief Complaint:  Chief Complaint  Patient presents with  . Right Knee - Pain  Patient returns. 2 weeks status post right total knee replacement. Doing well. Progress with rehabilitation. Visit Diagnoses:  1. Unilateral primary osteoarthritis, right knee     Plan: Patient will continue PT protocol. Follow up in 4 weeks for recheck.  Follow-Up Instructions: Return in about 4 weeks (around 06/18/2017) for dr yates.   Orders:  Orders Placed This Encounter  Procedures  . XR KNEE 3 VIEW RIGHT   No orders of the defined types were placed in this encounter.   Imaging: No results found.  PMFS History: Patient Active Problem List   Diagnosis Date Noted  . Arthritis of right knee 05/06/2017  . Swelling of right lower extremity 04/23/2017  . Acute pain of right knee 01/13/2017  . Left genital labial abscess 10/17/2016  . Labial abscess 10/17/2016  . Anxiety state 03/15/2015  . Anemia 03/14/2015  . COPD (chronic obstructive pulmonary disease) ([place]) 03/13/2015  . Ileus ([place]) 04/18/2014  . Tobacco abuse 04/18/2014  . Hx of adenomatous colonic polyps 09/07/2013  . Muscle spasms of neck 06/09/2013  . Cervical pain 03/17/2013  . C. difficile colitis 06/25/2011  . Chronic nausea 03/13/2011  . DEPRESSION 05/09/2007  . ALLERGIC RHINITIS 05/09/2007  . ASTHMA 05/09/2007  . GERD 05/09/2007  . PEPTIC ULCER DISEASE 05/09/2007  . IBS 05/09/2007  . ARTHRITIS 05/09/2007  . HIP PAIN, LEFT 05/09/2007  . NECK PAIN, CHRONIC 05/09/2007  . LOW BACK PAIN 05/09/2007  . CONSTIPATION, HX OF 05/09/2007  . HX, PERSONAL, PAST NONCOMPLIANCE 05/09/2007   Past Medical History:  Diagnosis Date  . ADHD (attention deficit hyperactivity disorder)   . Anginal pain ([place])   . Asthma   . BMI (body mass index) 20.0-29.9  2009 127 LBS  . Chronic abdominal pain 2003   EX LAP APR 2004 RUPTURE L OV CYST, JUL 2004 ADHESIONS  . Chronic nausea   . Clostridium difficile colitis 04/10/11 &11/2011   tx w/ flagyl  . COPD (chronic obstructive pulmonary disease) ([place])   . Degenerative disc disease   . Depression   . GERD (gastroesophageal reflux disease)   . Hiatal hernia 2008   on EGD above  . Irritable bowel syndrome 2004 CONSTIPATION  . Migraines   . MRSA infection    Dr. [name], currently under treatment  . PTSD (post-traumatic stress disorder)   . PUD (peptic ulcer disease) 01/2007   EGD Dr [name], 2 antral ulcers, negative h pylori  . Rectal prolapse   . S/P colonoscopy 06/02/09   normal ([name])  . S/P endoscopy 03/26/11   gastritis-Dr [name]  . SBO (small bowel obstruction) ([place])     Family History  Adopted: Yes    Past Surgical History:  Procedure Laterality Date  . ABDOMINAL HYSTERECTOMY    . APPENDECTOMY    . BACK SURGERY    . BOWEL RESECTION     x2, secondary to adhesions  . CERVICAL SPINE SURGERY    . COLONOSCOPY  03/21/2012    IOE:VOJJKK ADENOMA(1) POOR PREP  . COLONOSCOPY N/A 11/17/2013   XFG:HWEXHB mucosa in the terminal ileum/NORMAL surgical anastomosis/Small internal hemorrhoids  . ESOPHAGOGASTRODUODENOSCOPY  03/23/2011    Normal esophagus without evidence of Barrett's, mass, erosions, or ulcerations./ Patchy erythema with occasional erosion in the antrum.  Biopsies  obtained via cold forceps to evaluate for H. pylori gastritis/ Small hiatal hernia./Normal duodenal bulb and second portion of the duodenum.  . ESOPHAGOGASTRODUODENOSCOPY  03/21/2012   HFW:YOVZC Hiatal hernia/ABDOMINALPAIN/DIARRHEA MOST LIKELY DUE TO IBS, GASTRITIS DUODENITIS  . FLEXIBLE SIGMOIDOSCOPY  07/14/2002   Normal limited flexible sigmoidoscopy with stool in the rectum and rectosigmoid precluding a full colonoscopy  . JOINT REPLACEMENT    . LAPAROSCOPIC LYSIS INTESTINAL ADHESIONS    . NECK SURGERY   2009  . PARTIAL HYSTERECTOMY    . 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  . 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   Procedure: RIGHT TOTAL KNEE ARTHROPLASTY;  Surgeon: Marybelle Killings, MD;  Location: [facility];  Service: Orthopedics;  Laterality: Right;  . TUBAL LIGATION    . UMBILICAL HERNIA REPAIR  2008   x5  . UPPER GASTROINTESTINAL ENDOSCOPY  APR 2008 RMR BLEEDING/PAIN   PUD  . UPPER GASTROINTESTINAL ENDOSCOPY  JUL 2012 SLF PAIN   MILD GASTRITIS   Social History   Occupational History  . Occupation: disabled  Tobacco Use  . Smoking status: Current Every Day Smoker    Packs/day: 0.50    Years: 10.00    Pack years: 5.00    Types: Cigarettes  . Smokeless tobacco: Never Used  Substance and Sexual Activity  . Alcohol use: Yes    Comment: 05/07/2017 "quit when I was 19; drank for 1 year total"  . Drug use: No  . Sexual activity: No    Birth control/protection: Surgical   Exam List good. Staples removed and Steri-Strips applied. No drainage or signs of infection. Calf Nontender.

## 2017-10-31 ENCOUNTER — Ambulatory Visit (INDEPENDENT_AMBULATORY_CARE_PROVIDER_SITE_OTHER): Payer: Medicare Other | Admitting: Orthopaedic Surgery

## 2018-10-15 ENCOUNTER — Encounter: Payer: Self-pay | Admitting: Gastroenterology

## 2019-07-17 ENCOUNTER — Emergency Department (HOSPITAL_COMMUNITY)
Admission: EM | Admit: 2019-07-17 | Discharge: 2019-07-18 | Disposition: A | Discharge status: Home / Self Care | Payer: Medicare Other | Attending: Emergency Medicine | Admitting: Emergency Medicine

## 2019-07-17 ENCOUNTER — Encounter (HOSPITAL_COMMUNITY): Payer: Self-pay

## 2019-07-17 DIAGNOSIS — G43009 Migraine without aura, not intractable, without status migrainosus: Secondary | ICD-10-CM

## 2019-07-17 DIAGNOSIS — Z20828 Contact with and (suspected) exposure to other viral communicable diseases: Secondary | ICD-10-CM | POA: Insufficient documentation

## 2019-07-17 DIAGNOSIS — Y929 Unspecified place or not applicable: Secondary | ICD-10-CM | POA: Diagnosis not present

## 2019-07-17 DIAGNOSIS — X58XXXA Exposure to other specified factors, initial encounter: Secondary | ICD-10-CM | POA: Insufficient documentation

## 2019-07-17 DIAGNOSIS — S46011A Strain of muscle(s) and tendon(s) of the rotator cuff of right shoulder, initial encounter: Secondary | ICD-10-CM | POA: Insufficient documentation

## 2019-07-17 DIAGNOSIS — J189 Pneumonia, unspecified organism: Secondary | ICD-10-CM | POA: Diagnosis not present

## 2019-07-17 DIAGNOSIS — J441 Chronic obstructive pulmonary disease with (acute) exacerbation: Secondary | ICD-10-CM

## 2019-07-17 DIAGNOSIS — Y939 Activity, unspecified: Secondary | ICD-10-CM | POA: Diagnosis not present

## 2019-07-17 DIAGNOSIS — M542 Cervicalgia: Secondary | ICD-10-CM | POA: Diagnosis present

## 2019-07-17 DIAGNOSIS — Y999 Unspecified external cause status: Secondary | ICD-10-CM | POA: Diagnosis not present

## 2019-07-17 DIAGNOSIS — Z79899 Other long term (current) drug therapy: Secondary | ICD-10-CM | POA: Insufficient documentation

## 2019-07-17 DIAGNOSIS — S46911A Strain of unspecified muscle, fascia and tendon at shoulder and upper arm level, right arm, initial encounter: Secondary | ICD-10-CM

## 2019-07-17 DIAGNOSIS — G43909 Migraine, unspecified, not intractable, without status migrainosus: Secondary | ICD-10-CM | POA: Insufficient documentation

## 2019-07-17 DIAGNOSIS — J449 Chronic obstructive pulmonary disease, unspecified: Secondary | ICD-10-CM | POA: Insufficient documentation

## 2019-07-17 DIAGNOSIS — F17210 Nicotine dependence, cigarettes, uncomplicated: Secondary | ICD-10-CM | POA: Diagnosis not present

## 2019-07-17 MED ORDER — DEXAMETHASONE SODIUM PHOSPHATE 10 MG/ML IJ SOLN
10.0000 mg | Freq: Once | INTRAMUSCULAR | Status: AC
  Administered 2019-07-18: 01:00:00 10 mg via INTRAVENOUS
  Filled 2019-07-17: qty 1

## 2019-07-17 MED ORDER — PROCHLORPERAZINE EDISYLATE 10 MG/2ML IJ SOLN
10.0000 mg | Freq: Once | INTRAMUSCULAR | Status: AC
  Administered 2019-07-18: 01:00:00 10 mg via INTRAVENOUS
  Filled 2019-07-17: qty 2

## 2019-07-17 MED ORDER — DIPHENHYDRAMINE HCL 50 MG/ML IJ SOLN
12.5000 mg | Freq: Once | INTRAMUSCULAR | Status: AC
  Administered 2019-07-18: 01:00:00 12.5 mg via INTRAVENOUS
  Filled 2019-07-17: qty 1

## 2019-07-17 MED ORDER — ALBUTEROL SULFATE HFA 108 (90 BASE) MCG/ACT IN AERS
4.0000 | INHALATION_SPRAY | Freq: Once | RESPIRATORY_TRACT | Status: AC
  Administered 2019-07-18: 4 via RESPIRATORY_TRACT
  Filled 2019-07-17: qty 6.7

## 2019-07-17 NOTE — ED Triage Notes (Signed)
Pt reports 2 days of pain to the left side of her neck, states the pain goes up into her head and down into her right arm.  Pt states she is unable to move the right arm as she is raising the arm in the air.

## 2019-07-18 ENCOUNTER — Emergency Department (HOSPITAL_COMMUNITY): Payer: Medicare Other

## 2019-07-18 DIAGNOSIS — G43909 Migraine, unspecified, not intractable, without status migrainosus: Secondary | ICD-10-CM | POA: Diagnosis not present

## 2019-07-18 LAB — SARS CORONAVIRUS 2 (TAT 6-24 HRS): SARS Coronavirus 2: NEGATIVE

## 2019-07-18 MED ORDER — DOXYCYCLINE HYCLATE 100 MG PO CAPS: 100.0000 mg | ORAL_CAPSULE | Freq: Two times a day (BID) | ORAL | 0 refills | Status: DC

## 2019-07-18 MED ORDER — DOXYCYCLINE HYCLATE 100 MG PO TABS
100.0000 mg | ORAL_TABLET | Freq: Once | ORAL | Status: AC
  Administered 2019-07-18: 02:00:00 100 mg via ORAL
  Filled 2019-07-18: qty 1

## 2019-07-18 MED ORDER — PREDNISONE 50 MG PO TABS: ORAL_TABLET | ORAL | 0 refills | Status: DC

## 2019-07-18 MED ORDER — ONDANSETRON 4 MG PO TBDP: 4.0000 mg | ORAL_TABLET | Freq: Three times a day (TID) | ORAL | 0 refills | Status: DC | PRN

## 2019-07-18 NOTE — ED Notes (Signed)
Pt ambulatory in room with no difficulty or distress.

## 2019-07-18 NOTE — Discharge Instructions (Addendum)
Take the prednisone prescribed for your asthma/COPD flare in addition to continuing your albuterol inhaler every 4 hours as needed for continued wheezing or shortness of breath. Your chest xray suggests possible early pneumonia in your right lung (where your wheezing is the worst). Make sure you take the entire course of the antibiotics prescribed.  You have been screened for Covid today - your test should result within the next 2 days.  You will need to quarantine your self until you know the result of this test (you will need to quarantine for a full 10 days if positive).  Your shoulder xray is normal.  Call Dr. Lorin Mercy (your orthopedist) for further evaluation if your shoulder pain persists.  The prednisone should also help you with your shoulder pain.

## 2019-07-18 NOTE — ED Provider Notes (Signed)
[facility] EMERGENCY DEPARTMENT Provider Note   CSN: [id number] Arrival date & time: 07/17/19  2214     History   Chief Complaint Chief Complaint  Patient presents with  . Neck Pain  . Arm Pain    HPI [name] is a 58 y.o. female with a history as outlined below and most significant for history of COPD, cervical degenerative disc disease, history of migraine and history of peptic ulcer disease presenting with complaints of right shoulder pain, neck pain and headache x2 days.  She reports pain along the left side of her neck described as muscle soreness, which radiates into her left lateral head and forehead behind her left eye which is consistent with prior history of migraine headaches.  As mentioned she has chronic neck pain which is not worsened today.  She has right shoulder pain which is worsened with movement especially with forward flexion and abduction across midline (patient demonstrates this maneuver).  She denies weakness or numbness in her arms and denies injury or recent overuse.  She also endorses an exacerbation of her COPD stating she has had increased wheezing for the past 2 days.  She has had a cough productive of a thick sputum, has not visualized so cannot describe further.  She reports a fever of 100.6 yesterday, none today.  She denies exposure to COVID.  She has used her albuterol MDI twice today with temporary improvement in her symptoms.  She has had no other treatments for her symptoms.    The history is provided by the patient.    Past Medical History:  Diagnosis Date  . ADHD (attention deficit hyperactivity disorder)   . Anginal pain ([place])   . Asthma   . BMI (body mass index) 20.0-29.9 2009 127 LBS  . Chronic abdominal pain 2003   EX LAP APR 2004 RUPTURE L OV CYST, JUL 2004 ADHESIONS  . Chronic nausea   . Clostridium difficile colitis 04/10/11 &11/2011   tx w/ flagyl  . COPD (chronic obstructive pulmonary disease) ([place])   . Degenerative disc disease    . Depression   . GERD (gastroesophageal reflux disease)   . Hiatal hernia 2008   on EGD above  . Irritable bowel syndrome 2004 CONSTIPATION  . Migraines   . MRSA infection    Dr. [name], currently under treatment  . PTSD (post-traumatic stress disorder)   . PUD (peptic ulcer disease) 01/2007   EGD Dr [name], 2 antral ulcers, negative h pylori  . Rectal prolapse   . S/P colonoscopy 06/02/09   normal ([name])  . S/P endoscopy 03/26/11   gastritis-Dr [name]  . SBO (small bowel obstruction) [facility])     Patient Active Problem List   Diagnosis Date Noted  . Arthritis of right knee 05/06/2017  . Swelling of right lower extremity 04/23/2017  . Acute pain of right knee 01/13/2017  . Left genital labial abscess 10/17/2016  . Labial abscess 10/17/2016  . Anxiety state 03/15/2015  . Anemia 03/14/2015  . COPD (chronic obstructive pulmonary disease) ([place]) 03/13/2015  . Ileus ([place]) 04/18/2014  . Tobacco abuse 04/18/2014  . Hx of adenomatous colonic polyps 09/07/2013  . Muscle spasms of neck 06/09/2013  . Cervical pain 03/17/2013  . C. difficile colitis 06/25/2011  . Chronic nausea 03/13/2011  . DEPRESSION 05/09/2007  . ALLERGIC RHINITIS 05/09/2007  . ASTHMA 05/09/2007  . GERD 05/09/2007  . PEPTIC ULCER DISEASE 05/09/2007  . IBS 05/09/2007  . ARTHRITIS 05/09/2007  . HIP PAIN, LEFT  05/09/2007  . NECK PAIN, CHRONIC 05/09/2007  . LOW BACK PAIN 05/09/2007  . CONSTIPATION, HX OF 05/09/2007  . HX, PERSONAL, PAST NONCOMPLIANCE 05/09/2007    Past Surgical History:  Procedure Laterality Date  . ABDOMINAL HYSTERECTOMY    . APPENDECTOMY    . BACK SURGERY    . BOWEL RESECTION     x2, secondary to adhesions  . CERVICAL SPINE SURGERY    . COLONOSCOPY  03/21/2012    [id number] ADENOMA(1) POOR PREP  . COLONOSCOPY N/A 11/17/2013   [id number] mucosa in the terminal ileum/NORMAL surgical anastomosis/Small internal hemorrhoids  . ESOPHAGOGASTRODUODENOSCOPY  03/23/2011    Normal  esophagus without evidence of Barrett's, mass, erosions, or ulcerations./ Patchy erythema with occasional erosion in the antrum.  Biopsies  obtained via cold forceps to evaluate for H. pylori gastritis/ Small hiatal hernia./Normal duodenal bulb and second portion of the duodenum.  . ESOPHAGOGASTRODUODENOSCOPY  03/21/2012   [id number] Hiatal hernia/ABDOMINALPAIN/DIARRHEA MOST LIKELY DUE TO IBS, GASTRITIS DUODENITIS  . FLEXIBLE SIGMOIDOSCOPY  07/14/2002   Normal limited flexible sigmoidoscopy with stool in the rectum and rectosigmoid precluding a full colonoscopy  . JOINT REPLACEMENT    . LAPAROSCOPIC LYSIS INTESTINAL ADHESIONS    . NECK SURGERY  2009  . PARTIAL HYSTERECTOMY    . 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  . 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   Procedure: RIGHT TOTAL KNEE ARTHROPLASTY;  Surgeon: Marybelle Killings, MD;  Location: [facility];  Service: Orthopedics;  Laterality: Right;  . TUBAL LIGATION    . UMBILICAL HERNIA REPAIR  2008   x5  . UPPER GASTROINTESTINAL ENDOSCOPY  APR 2008 [facility] BLEEDING/PAIN   PUD  . UPPER GASTROINTESTINAL ENDOSCOPY  JUL 2012 [facility] PAIN   MILD GASTRITIS     OB History    Gravida  1   Para  1   Term  1   Preterm      AB      Living  1     SAB      TAB      Ectopic      Multiple      Live Births               Home Medications    Prior to Admission medications   Medication Sig Start Date End Date Taking? Authorizing Provider  acetaminophen-codeine (TYLENOL #3) 300-30 MG tablet Take 1 tablet by mouth every 8 (eight) hours as needed for moderate pain. Patient not taking: Reported on 06/27/2017 06/14/17   Marybelle Killings, MD  albuterol (PROVENTIL) (2.5 MG/3ML) 0.083% nebulizer solution Take 2.5 mg by nebulization every 6 (six) hours as needed for wheezing or shortness of breath.    [provider]  albuterol (PROVENTIL,VENTOLIN) 90 MCG/ACT inhaler Inhale 2 puffs into the lungs every 6 (six) hours as needed for wheezing or  shortness of breath. For shortness of breath    [provider]  doxycycline (VIBRAMYCIN) 100 MG capsule Take 1 capsule (100 mg total) by mouth 2 (two) times daily. 07/18/19   Evalee Jefferson, PA-C  DULoxetine (CYMBALTA) 60 MG capsule Take 120 mg by mouth daily.     [provider]  esomeprazole (NEXIUM) 40 MG capsule Take 1 capsule (40 mg total) by mouth 2 (two) times daily before a meal. 05/17/14   [name], PA-C  gabapentin (NEURONTIN) 300 MG capsule Take 300-600 mg by mouth See admin instructions. Take 300 mg in the AM and 600 mg at bedtime  [provider]  LORazepam (ATIVAN) 1 MG tablet Take 1 mg by mouth 3 (three) times daily as needed. for anxiety 05/23/17   [provider]  mirtazapine (REMERON) 15 MG tablet Take 15 mg by mouth at bedtime as needed (for rest).  08/17/13   [provider]  nicotine (NICODERM CQ - DOSED IN MG/24 HOURS) 14 mg/24hr patch Place 1 patch (14 mg total) onto the skin daily. Patient not taking: Reported on 04/25/2017 10/21/16   Donnamae Jude, MD  oxyCODONE-acetaminophen (PERCOCET/ROXICET) 5-325 MG tablet Take 1 tablet by mouth every 6 (six) hours as needed (moderate to severe pain (when tolerating fluids)). Patient not taking: Reported on 06/27/2017 05/30/17   Marybelle Killings, MD  prazosin (MINIPRESS) 5 MG capsule TAKE ONE CAPSULE BY MOUTH AT BEDTIME FOR PTSD/NIGHTMARES 05/23/17   [provider]  predniSONE (DELTASONE) 50 MG tablet Take one tablet daily for 5 days. 07/18/19   Evalee Jefferson, PA-C  traZODone (DESYREL) 50 MG tablet TAKE ONE TABLET BY MOUTH AT BEDTIME AS NEEDED SLEEP 05/23/17   [provider]    Family History Family History  Adopted: Yes    Social History Social History   Tobacco Use  . Smoking status: Current Every Day Smoker    Packs/day: 0.50    Years: 10.00    Pack years: 5.00    Types: Cigarettes  . Smokeless tobacco: Never Used  Substance Use Topics  . Alcohol use: Yes     Comment: 05/07/2017 "quit when I was 19; drank for 1 year total"  . Drug use: No     Allergies   Penicillins, Clarithromycin, Moxifloxacin, Quinolones, Salicylates, Lactose intolerance (gi), Lactulose, Aspirin, Buprenorphine hcl, Ibuprofen, Morphine and related, and Sulfa antibiotics   Review of Systems Review of Systems  Constitutional: Negative for fever.  HENT: Negative for congestion and sore throat.   Eyes: Negative.   Respiratory: Positive for shortness of breath and wheezing. Negative for chest tightness.   Cardiovascular: Negative for chest pain.  Gastrointestinal: Negative for abdominal pain and nausea.  Genitourinary: Negative.   Musculoskeletal: Positive for arthralgias. Negative for joint swelling and neck pain.  Skin: Negative.  Negative for rash and wound.  Neurological: Negative for dizziness, weakness, light-headedness, numbness and headaches.  Psychiatric/Behavioral: Negative.      Physical Exam Updated Vital Signs BP 120/72   Pulse 93   Temp 98.9 F (37.2 C) (Oral)   Resp 17   Ht 5\' 6"  (1.676 m)   Wt 68 kg   SpO2 94%   BMI 24.21 kg/m   Physical Exam Vitals signs and nursing note reviewed.  Constitutional:      Appearance: She is well-developed.  HENT:     Head: Normocephalic and atraumatic.  Eyes:     Conjunctiva/sclera: Conjunctivae normal.  Neck:     Musculoskeletal: Normal range of motion.  Cardiovascular:     Rate and Rhythm: Normal rate and regular rhythm.     Heart sounds: Normal heart sounds.  Pulmonary:     Effort: Pulmonary effort is normal.     Breath sounds: Wheezing present. No rhonchi.     Comments: Expiratory wheeze with prolonged expiration use, right greater than left. Musculoskeletal: Normal range of motion.  Skin:    General: Skin is warm and dry.  Neurological:     Mental Status: She is alert.      ED Treatments / Results  Labs (all labs ordered are listed, but only abnormal results are displayed) Labs Reviewed -  No data to display  EKG None  Radiology Dg Chest Portable 1 View  Result Date: 07/18/2019 CLINICAL DATA:  Wheezing and shortness of breath EXAM: PORTABLE CHEST 1 VIEW COMPARISON:  06/22/2019 FINDINGS: The heart size is stable from prior study. There is no pneumothorax. There are bibasilar streaky airspace opacities emphysematous changes are noted bilaterally. There is no pneumothorax. No large pleural effusion. No acute osseous abnormality. IMPRESSION: 1. Streaky bibasilar airspace opacities, right worse than left, which may represent atelectasis or developing pneumonia. 2. Emphysema. Electronically Signed   By: Constance Holster M.D.   On: 07/18/2019 01:44   Dg Shoulder Right Portable  Result Date: 07/18/2019 CLINICAL DATA:  Right shoulder pain EXAM: PORTABLE RIGHT SHOULDER COMPARISON:  None. FINDINGS: There is no evidence of fracture or dislocation. There is no evidence of arthropathy or other focal bone abnormality. Soft tissues are unremarkable. IMPRESSION: Negative. Electronically Signed   By: Constance Holster M.D.   On: 07/18/2019 01:45    Procedures Procedures (including critical care time)  Medications Ordered in ED Medications  doxycycline (VIBRA-TABS) tablet 100 mg (has no administration in time range)  albuterol (VENTOLIN HFA) 108 (90 Base) MCG/ACT inhaler 4 puff (4 puffs Inhalation Given 07/18/19 0016)  dexamethasone (DECADRON) injection 10 mg (10 mg Intravenous Given 07/18/19 0050)  prochlorperazine (COMPAZINE) injection 10 mg (10 mg Intravenous Given 07/18/19 0050)  diphenhydrAMINE (BENADRYL) injection 12.5 mg (12.5 mg Intravenous Given 07/18/19 0050)     Initial Impression / Assessment and Plan / ED Course  I have reviewed the triage vital signs and the nursing notes.  Pertinent labs & imaging results that were available during my care of the patient were reviewed by me and considered in my medical decision making (see chart for details).        Pt given  albuterol mdi for wheezing, also gave migraine medicines including decadron, compazine and benadryl IV.  Sx improving.  Imaging reviewed and discussed, suggestion of right sided CAP, may also explain right shoulder pain, although shoulder pain is worsened with movement, favoring MSK source.  She was prescribed prednisone and doxycycline, she has albuterol mdi.  Will plan close f/u with pcp , return precautions discussed.   [name] was evaluated in Emergency Department on 07/18/2019 for the symptoms described in the history of present illness. She was evaluated in the context of the global COVID-19 pandemic, which necessitated consideration that the patient might be at risk for infection with the SARS-CoV-2 virus that causes COVID-19. Institutional protocols and algorithms that pertain to the evaluation of patients at risk for COVID-19 are in a state of rapid change based on information released by regulatory bodies including the CDC and federal and state organizations. These policies and algorithms were followed during the patient's care in the ED.   Final Clinical Impressions(s) / ED Diagnoses   Final diagnoses:  Strain of right shoulder, initial encounter  Migraine without aura and without status migrainosus, not intractable  Chronic obstructive pulmonary disease with acute exacerbation ([place])  Community acquired pneumonia of right lung, unspecified part of lung    ED Discharge Orders         Ordered    predniSONE (DELTASONE) 50 MG tablet     07/18/19 0144    doxycycline (VIBRAMYCIN) 100 MG capsule  2 times daily     07/18/19 0151           Evalee Jefferson, PA-C 07/18/19 0201    Rolland Porter, MD 07/18/19 [phone]

## 2019-10-14 ENCOUNTER — Emergency Department (HOSPITAL_COMMUNITY)
Admission: EM | Admit: 2019-10-14 | Discharge: 2019-10-14 | Disposition: A | Discharge status: Home / Self Care | Payer: Medicare Other | Attending: Emergency Medicine | Admitting: Emergency Medicine

## 2019-10-14 ENCOUNTER — Other Ambulatory Visit: Payer: Self-pay

## 2019-10-14 ENCOUNTER — Encounter (HOSPITAL_COMMUNITY): Payer: Self-pay | Admitting: Emergency Medicine

## 2019-10-14 DIAGNOSIS — K279 Peptic ulcer, site unspecified, unspecified as acute or chronic, without hemorrhage or perforation: Secondary | ICD-10-CM | POA: Diagnosis not present

## 2019-10-14 DIAGNOSIS — F17210 Nicotine dependence, cigarettes, uncomplicated: Secondary | ICD-10-CM | POA: Diagnosis not present

## 2019-10-14 DIAGNOSIS — R1013 Epigastric pain: Secondary | ICD-10-CM | POA: Diagnosis present

## 2019-10-14 DIAGNOSIS — Z96651 Presence of right artificial knee joint: Secondary | ICD-10-CM | POA: Insufficient documentation

## 2019-10-14 DIAGNOSIS — Z79899 Other long term (current) drug therapy: Secondary | ICD-10-CM | POA: Insufficient documentation

## 2019-10-14 DIAGNOSIS — J449 Chronic obstructive pulmonary disease, unspecified: Secondary | ICD-10-CM | POA: Insufficient documentation

## 2019-10-14 LAB — COMPREHENSIVE METABOLIC PANEL
ALT: 26 U/L (ref 0–44)
AST: 24 U/L (ref 15–41)
Albumin: 3.7 g/dL (ref 3.5–5.0)
Alkaline Phosphatase: 94 U/L (ref 38–126)
Anion gap: 9 (ref 5–15)
BUN: 10 mg/dL (ref 6–20)
CO2: 25 mmol/L (ref 22–32)
Calcium: 8.7 mg/dL — ABNORMAL LOW (ref 8.9–10.3)
Chloride: 103 mmol/L (ref 98–111)
Creatinine, Ser: 0.72 mg/dL (ref 0.44–1.00)
GFR calc Af Amer: >60 mL/min (ref >60)
GFR calc non Af Amer: >60 mL/min (ref >60)
Glucose, Bld: 102 mg/dL — ABNORMAL HIGH (ref 70–99)
Potassium: 3.3 mmol/L — ABNORMAL LOW (ref 3.5–5.1)
Sodium: 137 mmol/L (ref 135–145)
Total Bilirubin: 0.5 mg/dL (ref 0.3–1.2)
Total Protein: 6.6 g/dL (ref 6.5–8.1)

## 2019-10-14 LAB — URINALYSIS, ROUTINE W REFLEX MICROSCOPIC
Bacteria, UA: NONE SEEN
Bilirubin Urine: NEGATIVE
Glucose, UA: NEGATIVE mg/dL
Hgb urine dipstick: NEGATIVE
Ketones, ur: NEGATIVE mg/dL
Nitrite: NEGATIVE
Protein, ur: NEGATIVE mg/dL
Specific Gravity, Urine: 1.019 (ref 1.005–1.030)
pH: 5 (ref 5.0–8.0)

## 2019-10-14 LAB — CBC
HCT: 30.9 % — ABNORMAL LOW (ref 36.0–46.0)
Hemoglobin: 8.7 g/dL — ABNORMAL LOW (ref 12.0–15.0)
MCH: 22.2 pg — ABNORMAL LOW (ref 26.0–34.0)
MCHC: 28.2 g/dL — ABNORMAL LOW (ref 30.0–36.0)
MCV: 78.8 fL — ABNORMAL LOW (ref 80.0–100.0)
Platelets: 411 10*3/uL — ABNORMAL HIGH (ref 150–400)
RBC: 3.92 MIL/uL (ref 3.87–5.11)
RDW: 18.9 % — ABNORMAL HIGH (ref 11.5–15.5)
WBC: 10.2 10*3/uL (ref 4.0–10.5)
nRBC: 0 % (ref 0.0–0.2)

## 2019-10-14 LAB — LIPASE, BLOOD: Lipase: 17 U/L (ref 11–51)

## 2019-10-14 MED ORDER — DEXAMETHASONE SODIUM PHOSPHATE 10 MG/ML IJ SOLN
10.0000 mg | Freq: Once | INTRAMUSCULAR | Status: AC
  Administered 2019-10-14: 10 mg via INTRAMUSCULAR
  Filled 2019-10-14: qty 1

## 2019-10-14 MED ORDER — ALBUTEROL SULFATE HFA 108 (90 BASE) MCG/ACT IN AERS
2.0000 | INHALATION_SPRAY | Freq: Once | RESPIRATORY_TRACT | Status: AC
  Administered 2019-10-14: 14:00:00 2 via RESPIRATORY_TRACT
  Filled 2019-10-14: qty 6.7

## 2019-10-14 MED ORDER — ONDANSETRON HCL 4 MG/2ML IJ SOLN
4.0000 mg | Freq: Once | INTRAMUSCULAR | Status: AC
  Administered 2019-10-14: 4 mg via INTRAVENOUS
  Filled 2019-10-14: qty 2

## 2019-10-14 MED ORDER — SUCRALFATE 1 GM/10ML PO SUSP
1.0000 g | Freq: Three times a day (TID) | ORAL | Status: DC
  Administered 2019-10-14: 15:00:00 1 g via ORAL
  Filled 2019-10-14 (×2): qty 10

## 2019-10-14 MED ORDER — OXYCODONE-ACETAMINOPHEN 5-325 MG PO TABS
1.0000 | ORAL_TABLET | Freq: Once | ORAL | Status: AC
  Administered 2019-10-14: 1 via ORAL
  Filled 2019-10-14: qty 1

## 2019-10-14 MED ORDER — OMEPRAZOLE 20 MG PO CPDR: 20.0000 mg | DELAYED_RELEASE_CAPSULE | Freq: Two times a day (BID) | ORAL | 0 refills | Status: DC

## 2019-10-14 MED ORDER — ONDANSETRON 4 MG PO TBDP: 4.0000 mg | ORAL_TABLET | Freq: Three times a day (TID) | ORAL | 0 refills | Status: DC | PRN

## 2019-10-14 MED ORDER — SUCRALFATE 1 G PO TABS: 1.0000 g | ORAL_TABLET | Freq: Three times a day (TID) | ORAL | 0 refills | Status: DC

## 2019-10-14 MED ORDER — PANTOPRAZOLE SODIUM 40 MG IV SOLR
40.0000 mg | Freq: Once | INTRAVENOUS | Status: AC
  Administered 2019-10-14: 40 mg via INTRAVENOUS
  Filled 2019-10-14: qty 40

## 2019-10-14 NOTE — ED Triage Notes (Signed)
Pt states that her ulcer is causing her a lot of pain she states that everything she eats she vomits it back up.

## 2019-10-14 NOTE — ED Provider Notes (Signed)
[facility] EMERGENCY DEPARTMENT Provider Note   CSN: [id number] Arrival date & time: 10/14/19  1041     History Chief Complaint  Patient presents with  . Abdominal Pain    [name] is a 59 y.o. female.  HPI     This is a 59 year old female with a history of abdominal pain, COPD, peptic ulcer who presents with abdominal pain.  Patient reports epigastric sharp pain that is worse with eating.  She reports that this is consistent with her prior peptic ulcers.  She has been taking Maalox with minimal relief.  She does report that her primary doctor put her on "a small pill" but she is not sure what it was.  She states that it has helped intermittently.  She reports nonbilious, nonbloody emesis.  No diarrhea or constipation.  She rates her pain 8 out of 10.  Of note, she also reports frequent inhaler use at home.  No fevers.  She just reports persistent wheezing and shortness of breath.  History of the same.  Past Medical History:  Diagnosis Date  . ADHD (attention deficit hyperactivity disorder)   . Anginal pain ([place])   . Asthma   . BMI (body mass index) 20.0-29.9 2009 127 LBS  . Chronic abdominal pain 2003   EX LAP APR 2004 RUPTURE L OV CYST, JUL 2004 ADHESIONS  . Chronic nausea   . Clostridium difficile colitis 04/10/11 &11/2011   tx w/ flagyl  . COPD (chronic obstructive pulmonary disease) ([place])   . Degenerative disc disease   . Depression   . GERD (gastroesophageal reflux disease)   . Hiatal hernia 2008   on EGD above  . Irritable bowel syndrome 2004 CONSTIPATION  . Migraines   . MRSA infection    Dr. [name], currently under treatment  . PTSD (post-traumatic stress disorder)   . PUD (peptic ulcer disease) 01/2007   EGD Dr [name], 2 antral ulcers, negative h pylori  . Rectal prolapse   . S/P colonoscopy 06/02/09   normal ([name])  . S/P endoscopy 03/26/11   gastritis-Dr [name]  . SBO (small bowel obstruction) [facility])     Patient Active Problem List   Diagnosis Date Noted  . Arthritis of right knee 05/06/2017  . Swelling of right lower extremity 04/23/2017  . Acute pain of right knee 01/13/2017  . Left genital labial abscess 10/17/2016  . Labial abscess 10/17/2016  . Anxiety state 03/15/2015  . Anemia 03/14/2015  . COPD (chronic obstructive pulmonary disease) ([place]) 03/13/2015  . Ileus ([place]) 04/18/2014  . Tobacco abuse 04/18/2014  . Hx of adenomatous colonic polyps 09/07/2013  . Muscle spasms of neck 06/09/2013  . Cervical pain 03/17/2013  . C. difficile colitis 06/25/2011  . Chronic nausea 03/13/2011  . DEPRESSION 05/09/2007  . ALLERGIC RHINITIS 05/09/2007  . ASTHMA 05/09/2007  . GERD 05/09/2007  . PEPTIC ULCER DISEASE 05/09/2007  . IBS 05/09/2007  . ARTHRITIS 05/09/2007  . HIP PAIN, LEFT 05/09/2007  . NECK PAIN, CHRONIC 05/09/2007  . LOW BACK PAIN 05/09/2007  . CONSTIPATION, HX OF 05/09/2007  . HX, PERSONAL, PAST NONCOMPLIANCE 05/09/2007    Past Surgical History:  Procedure Laterality Date  . ABDOMINAL HYSTERECTOMY    . APPENDECTOMY    . BACK SURGERY    . BOWEL RESECTION     x2, secondary to adhesions  . CERVICAL SPINE SURGERY    . COLONOSCOPY  03/21/2012    [id number] ADENOMA(1) POOR PREP  . COLONOSCOPY N/A 11/17/2013   [id number]  mucosa in the terminal ileum/NORMAL surgical anastomosis/Small internal hemorrhoids  . ESOPHAGOGASTRODUODENOSCOPY  03/23/2011    Normal esophagus without evidence of Barrett's, mass, erosions, or ulcerations./ Patchy erythema with occasional erosion in the antrum.  Biopsies  obtained via cold forceps to evaluate for H. pylori gastritis/ Small hiatal hernia./Normal duodenal bulb and second portion of the duodenum.  . ESOPHAGOGASTRODUODENOSCOPY  03/21/2012   [id number] Hiatal hernia/ABDOMINALPAIN/DIARRHEA MOST LIKELY DUE TO IBS, GASTRITIS DUODENITIS  . FLEXIBLE SIGMOIDOSCOPY  07/14/2002   Normal limited flexible sigmoidoscopy with stool in the rectum and rectosigmoid precluding a full  colonoscopy  . JOINT REPLACEMENT    . LAPAROSCOPIC LYSIS INTESTINAL ADHESIONS    . NECK SURGERY  2009  . PARTIAL HYSTERECTOMY    . 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  . 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   Procedure: RIGHT TOTAL KNEE ARTHROPLASTY;  Surgeon: Marybelle Killings, MD;  Location: [place];  Service: Orthopedics;  Laterality: Right;  . TUBAL LIGATION    . UMBILICAL HERNIA REPAIR  2008   x5  . UPPER GASTROINTESTINAL ENDOSCOPY  APR 2008 [facility] BLEEDING/PAIN   PUD  . UPPER GASTROINTESTINAL ENDOSCOPY  JUL 2012 [facility] PAIN   MILD GASTRITIS     OB History    Gravida  1   Para  1   Term  1   Preterm      AB      Living  1     SAB      TAB      Ectopic      Multiple      Live Births              Family History  Adopted: Yes    Social History   Tobacco Use  . Smoking status: Current Every Day Smoker    Packs/day: 0.02    Years: 10.00    Pack years: 0.20    Types: Cigarettes  . Smokeless tobacco: Never Used  Substance Use Topics  . Alcohol use: Yes    Comment: 05/07/2017 "quit when I was 19; drank for 1 year total"  . Drug use: No    Home Medications Prior to Admission medications   Medication Sig Start Date End Date Taking? Authorizing Provider  acetaminophen-codeine (TYLENOL #3) 300-30 MG tablet Take 1 tablet by mouth every 8 (eight) hours as needed for moderate pain. Patient not taking: Reported on 06/27/2017 06/14/17   Marybelle Killings, MD  albuterol (PROVENTIL) (2.5 MG/3ML) 0.083% nebulizer solution Take 2.5 mg by nebulization every 6 (six) hours as needed for wheezing or shortness of breath.    [provider]  albuterol (PROVENTIL,VENTOLIN) 90 MCG/ACT inhaler Inhale 2 puffs into the lungs every 6 (six) hours as needed for wheezing or shortness of breath. For shortness of breath    [provider]  doxycycline (VIBRAMYCIN) 100 MG capsule Take 1 capsule (100 mg total) by mouth 2 (two) times daily. 07/18/19   Evalee Jefferson, PA-C    DULoxetine (CYMBALTA) 60 MG capsule Take 120 mg by mouth daily.     [provider]  esomeprazole (NEXIUM) 40 MG capsule Take 1 capsule (40 mg total) by mouth 2 (two) times daily before a meal. 05/17/14   [name], PA-C  gabapentin (NEURONTIN) 300 MG capsule Take 300-600 mg by mouth See admin instructions. Take 300 mg in the AM and 600 mg at bedtime    [provider]  LORazepam (ATIVAN) 1 MG tablet Take 1 mg by mouth  3 (three) times daily as needed. for anxiety 05/23/17   [provider]  mirtazapine (REMERON) 15 MG tablet Take 15 mg by mouth at bedtime as needed (for rest).  08/17/13   [provider]  nicotine (NICODERM CQ - DOSED IN MG/24 HOURS) 14 mg/24hr patch Place 1 patch (14 mg total) onto the skin daily. Patient not taking: Reported on 04/25/2017 10/21/16   Donnamae Jude, MD  omeprazole (PRILOSEC) 20 MG capsule Take 1 capsule (20 mg total) by mouth 2 (two) times daily before a meal. 10/14/19   Han Vejar, Barbette Hair, MD  ondansetron (ZOFRAN ODT) 4 MG disintegrating tablet Take 1 tablet (4 mg total) by mouth every 8 (eight) hours as needed for nausea or vomiting. 07/18/19   Evalee Jefferson, PA-C  ondansetron (ZOFRAN ODT) 4 MG disintegrating tablet Take 1 tablet (4 mg total) by mouth every 8 (eight) hours as needed for nausea or vomiting. 10/14/19   Grayton Lobo, Barbette Hair, MD  oxyCODONE-acetaminophen (PERCOCET/ROXICET) 5-325 MG tablet Take 1 tablet by mouth every 6 (six) hours as needed (moderate to severe pain (when tolerating fluids)). Patient not taking: Reported on 06/27/2017 05/30/17   Marybelle Killings, MD  prazosin (MINIPRESS) 5 MG capsule TAKE ONE CAPSULE BY MOUTH AT BEDTIME FOR PTSD/NIGHTMARES 05/23/17   [provider]  predniSONE (DELTASONE) 50 MG tablet Take one tablet daily for 5 days. 07/18/19   Evalee Jefferson, PA-C  sucralfate (CARAFATE) 1 g tablet Take 1 tablet (1 g total) by mouth 4 (four) times daily -  with meals and at bedtime. 10/14/19   Ilhan Madan,  Barbette Hair, MD  traZODone (DESYREL) 50 MG tablet TAKE ONE TABLET BY MOUTH AT BEDTIME AS NEEDED SLEEP 05/23/17   [provider]    Allergies    Penicillins, Clarithromycin, Moxifloxacin, Quinolones, Salicylates, Lactose intolerance (gi), Lactulose, Aspirin, Buprenorphine hcl, Ibuprofen, Morphine and related, and Sulfa antibiotics  Review of Systems   Review of Systems  Constitutional: Negative for fever.  Respiratory: Positive for shortness of breath and wheezing.   Cardiovascular: Negative for chest pain.  Gastrointestinal: Positive for abdominal pain, nausea and vomiting.  Genitourinary: Negative for dysuria.  Musculoskeletal: Negative for back pain.  Neurological: Negative for numbness.  All other systems reviewed and are negative.   Physical Exam Updated Vital Signs BP 109/65 (BP Location: Right Arm)   Pulse 93   Temp 98.3 F (36.8 C) (Oral)   Resp 16   Ht 1.626 m (5\' 4" )   Wt 70.3 kg   SpO2 94%   BMI 26.61 kg/m   Physical Exam Vitals and nursing note reviewed.  Constitutional:      Appearance: She is well-developed. She is not ill-appearing.  HENT:     Head: Normocephalic and atraumatic.  Eyes:     Pupils: Pupils are equal, round, and reactive to light.  Cardiovascular:     Rate and Rhythm: Normal rate and regular rhythm.     Heart sounds: Normal heart sounds.  Pulmonary:     Effort: Pulmonary effort is normal. No respiratory distress.     Breath sounds: Wheezing present.     Comments: Fair air movement, wheezing in all lung fields, no respiratory distress Abdominal:     General: Bowel sounds are normal.     Palpations: Abdomen is soft.     Tenderness: There is abdominal tenderness in the epigastric area. There is no guarding or rebound.  Musculoskeletal:     Cervical back: Neck supple.  Skin:    General:  Skin is warm and dry.  Neurological:     Mental Status: She is alert and oriented to person, place, and time.  Psychiatric:        Mood and  Affect: Mood normal.     ED Results / Procedures / Treatments   Labs (all labs ordered are listed, but only abnormal results are displayed) Labs Reviewed  COMPREHENSIVE METABOLIC PANEL - Abnormal; Notable for the following components:      Result Value   Potassium 3.3 (*)    Glucose, Bld 102 (*)    Calcium 8.7 (*)    All other components within normal limits  CBC - Abnormal; Notable for the following components:   Hemoglobin 8.7 (*)    HCT 30.9 (*)    MCV 78.8 (*)    MCH 22.2 (*)    MCHC 28.2 (*)    RDW 18.9 (*)    Platelets 411 (*)    All other components within normal limits  URINALYSIS, ROUTINE W REFLEX MICROSCOPIC - Abnormal; Notable for the following components:   APPearance HAZY (*)    Leukocytes,Ua MODERATE (*)    All other components within normal limits  LIPASE, BLOOD    EKG None  Radiology No results found.  Procedures Procedures (including critical care time)  Medications Ordered in ED Medications  sucralfate (CARAFATE) 1 GM/10ML suspension 1 g (1 g Oral Given 10/14/19 1436)  pantoprazole (PROTONIX) injection 40 mg (40 mg Intravenous Given 10/14/19 1415)  ondansetron (ZOFRAN) injection 4 mg (4 mg Intravenous Given 10/14/19 1415)  albuterol (VENTOLIN HFA) 108 (90 Base) MCG/ACT inhaler 2 puff (2 puffs Inhalation Given 10/14/19 1353)  oxyCODONE-acetaminophen (PERCOCET/ROXICET) 5-325 MG per tablet 1 tablet (1 tablet Oral Given 10/14/19 1519)    ED Course  I have reviewed the triage vital signs and the nursing notes.  Pertinent labs & imaging results that were available during my care of the patient were reviewed by me and considered in my medical decision making (see chart for details).    MDM Rules/Calculators/A&P                      This is a 59 year old female who presents with epigastric pain and vomiting.  Reports symptoms are consistent with her prior peptic ulcer disease.  She does have symptoms related to food.  She is overall nontoxic-appearing.  She  does have tenderness on exam but no signs of peritonitis.  Lab work-up is notable for potassium of 3.3.  Hemoglobin is 8.7.  Last hemoglobin on file is from 2 years ago.  She denies any acute bleeding.  No significant evidence of dehydration.  Given that her exam is fairly benign without signs of peritonitis, would recommend supportive measures and gastroenterology follow-up.  She previously saw [name] for colonoscopy.  Will discharge with Zofran, omeprazole, and Carafate.  After history, exam, and medical workup I feel the patient has been appropriately medically screened and is safe for discharge home. Pertinent diagnoses were discussed with the patient. Patient was given return precautions.   Final Clinical Impression(s) / ED Diagnoses Final diagnoses:  PUD (peptic ulcer disease)    Rx / DC Orders ED Discharge Orders         Ordered    omeprazole (PRILOSEC) 20 MG capsule  2 times daily before meals     10/14/19 1547    ondansetron (ZOFRAN ODT) 4 MG disintegrating tablet  Every 8 hours PRN     10/14/19 1547  sucralfate (CARAFATE) 1 g tablet  3 times daily with meals & bedtime     10/14/19 1547           Jaun Galluzzo, Barbette Hair, MD 10/14/19 1550

## 2019-10-14 NOTE — ED Notes (Signed)
Pt says is concerned about her wheezing.  She said she gets sob and wheezing with exertion. o2 sat 94%.  EDP notified.  Decadron ordered, instructed pt to follow up with pcp.

## 2019-10-14 NOTE — Discharge Instructions (Signed)
You were seen today for upper abdominal pain.  Continue omeprazole at home twice daily.  You will be given Zofran.  You will also be given Carafate.  Follow-up with gastroenterology as you may need an upper endoscopy.

## 2019-12-03 ENCOUNTER — Ambulatory Visit (INDEPENDENT_AMBULATORY_CARE_PROVIDER_SITE_OTHER): Payer: Medicare Other | Admitting: Internal Medicine

## 2019-12-10 ENCOUNTER — Emergency Department (HOSPITAL_COMMUNITY)
Admission: EM | Admit: 2019-12-10 | Discharge: 2019-12-10 | Disposition: A | Discharge status: Home / Self Care | Payer: Medicare Other | Attending: Emergency Medicine | Admitting: Emergency Medicine

## 2019-12-10 ENCOUNTER — Encounter (HOSPITAL_COMMUNITY): Payer: Self-pay | Admitting: Emergency Medicine

## 2019-12-10 ENCOUNTER — Other Ambulatory Visit: Payer: Self-pay

## 2019-12-10 DIAGNOSIS — R112 Nausea with vomiting, unspecified: Secondary | ICD-10-CM | POA: Diagnosis not present

## 2019-12-10 DIAGNOSIS — R1032 Left lower quadrant pain: Secondary | ICD-10-CM | POA: Insufficient documentation

## 2019-12-10 DIAGNOSIS — Z5321 Procedure and treatment not carried out due to patient leaving prior to being seen by health care provider: Secondary | ICD-10-CM | POA: Diagnosis not present

## 2019-12-10 NOTE — ED Triage Notes (Signed)
Patient complains of LLQ abdominal pain for the past 3 days with nausea and vomiting. Patient has a history of multiple abdominal surgeries and diverticulitis in the past.

## 2019-12-22 ENCOUNTER — Ambulatory Visit (INDEPENDENT_AMBULATORY_CARE_PROVIDER_SITE_OTHER): Payer: Medicare Other | Admitting: Internal Medicine

## 2020-01-04 ENCOUNTER — Emergency Department (HOSPITAL_COMMUNITY)
Admission: EM | Admit: 2020-01-04 | Discharge: 2020-01-04 | Disposition: A | Discharge status: Home / Self Care | Payer: Medicare Other | Attending: Emergency Medicine | Admitting: Emergency Medicine

## 2020-01-04 ENCOUNTER — Encounter (HOSPITAL_COMMUNITY): Payer: Self-pay | Admitting: Emergency Medicine

## 2020-01-04 ENCOUNTER — Other Ambulatory Visit: Payer: Self-pay

## 2020-01-04 ENCOUNTER — Emergency Department (HOSPITAL_COMMUNITY): Payer: Medicare Other

## 2020-01-04 DIAGNOSIS — Z79899 Other long term (current) drug therapy: Secondary | ICD-10-CM | POA: Diagnosis not present

## 2020-01-04 DIAGNOSIS — Z96651 Presence of right artificial knee joint: Secondary | ICD-10-CM | POA: Insufficient documentation

## 2020-01-04 DIAGNOSIS — F17210 Nicotine dependence, cigarettes, uncomplicated: Secondary | ICD-10-CM | POA: Insufficient documentation

## 2020-01-04 DIAGNOSIS — R0789 Other chest pain: Secondary | ICD-10-CM | POA: Diagnosis not present

## 2020-01-04 DIAGNOSIS — R05 Cough: Secondary | ICD-10-CM | POA: Diagnosis not present

## 2020-01-04 DIAGNOSIS — J441 Chronic obstructive pulmonary disease with (acute) exacerbation: Secondary | ICD-10-CM | POA: Diagnosis not present

## 2020-01-04 DIAGNOSIS — Z20822 Contact with and (suspected) exposure to covid-19: Secondary | ICD-10-CM | POA: Insufficient documentation

## 2020-01-04 DIAGNOSIS — R0602 Shortness of breath: Secondary | ICD-10-CM | POA: Diagnosis present

## 2020-01-04 LAB — COMPREHENSIVE METABOLIC PANEL
ALT: 24 U/L (ref 0–44)
AST: 29 U/L (ref 15–41)
Albumin: 3.3 g/dL — ABNORMAL LOW (ref 3.5–5.0)
Alkaline Phosphatase: 91 U/L (ref 38–126)
Anion gap: 9 (ref 5–15)
BUN: 18 mg/dL (ref 6–20)
CO2: 23 mmol/L (ref 22–32)
Calcium: 8.6 mg/dL — ABNORMAL LOW (ref 8.9–10.3)
Chloride: 101 mmol/L (ref 98–111)
Creatinine, Ser: 0.69 mg/dL (ref 0.44–1.00)
GFR calc Af Amer: >60 mL/min (ref >60)
GFR calc non Af Amer: >60 mL/min (ref >60)
Glucose, Bld: 103 mg/dL — ABNORMAL HIGH (ref 70–99)
Potassium: 3.8 mmol/L (ref 3.5–5.1)
Sodium: 133 mmol/L — ABNORMAL LOW (ref 135–145)
Total Bilirubin: 0.3 mg/dL (ref 0.3–1.2)
Total Protein: 5.9 g/dL — ABNORMAL LOW (ref 6.5–8.1)

## 2020-01-04 LAB — CBC WITH DIFFERENTIAL/PLATELET
Abs Immature Granulocytes: 0.12 10*3/uL — ABNORMAL HIGH (ref 0.00–0.07)
Basophils Absolute: 0 10*3/uL (ref 0.0–0.1)
Basophils Relative: 0 %
Eosinophils Absolute: 0.3 10*3/uL (ref 0.0–0.5)
Eosinophils Relative: 3 %
HCT: 32.8 % — ABNORMAL LOW (ref 36.0–46.0)
Hemoglobin: 9.7 g/dL — ABNORMAL LOW (ref 12.0–15.0)
Immature Granulocytes: 1 %
Lymphocytes Relative: 18 %
Lymphs Abs: 1.9 10*3/uL (ref 0.7–4.0)
MCH: 24.4 pg — ABNORMAL LOW (ref 26.0–34.0)
MCHC: 29.6 g/dL — ABNORMAL LOW (ref 30.0–36.0)
MCV: 82.4 fL (ref 80.0–100.0)
Monocytes Absolute: 1 10*3/uL (ref 0.1–1.0)
Monocytes Relative: 10 %
Neutro Abs: 7 10*3/uL (ref 1.7–7.7)
Neutrophils Relative %: 68 %
Platelets: 394 10*3/uL (ref 150–400)
RBC: 3.98 MIL/uL (ref 3.87–5.11)
RDW: 21.1 % — ABNORMAL HIGH (ref 11.5–15.5)
WBC: 10.3 10*3/uL (ref 4.0–10.5)
nRBC: 0 % (ref 0.0–0.2)

## 2020-01-04 LAB — RESPIRATORY PANEL BY RT PCR (FLU A&B, COVID)
Influenza A by PCR: NEGATIVE
Influenza B by PCR: NEGATIVE
SARS Coronavirus 2 by RT PCR: NEGATIVE

## 2020-01-04 LAB — TROPONIN I (HIGH SENSITIVITY): Troponin I (High Sensitivity): 2 ng/L (ref <18)

## 2020-01-04 NOTE — ED Triage Notes (Signed)
Pt c/o shortness of breath and chest pain. Was just seen at [facility] yesterday and was given albuterol. Used albuterol before EMS arrived with about "15 minutes of relief". Pt ambulated around room with sats dipping to 90% on RA.

## 2020-01-04 NOTE — ED Provider Notes (Signed)
[facility] EMERGENCY DEPARTMENT Provider Note   CSN: [id number] Arrival date & time: 01/04/20  1521     History Chief Complaint  Patient presents with  . Shortness of Breath    [name] is a 59 y.o. female.  This patient is a 59 year old female, she has known COPD, she was on prednisone last 1 month ago, she woke up yesterday with increasing shortness of breath and took herself to the hospital at an outside hospital where she was given albuterol treatment and prednisone which helped her to improve, she went home last night and was able to sleep but woke up this morning with severe shortness of breath and increasing coughing up purulent sputum.  She does have a chest heaviness which seems to get better with albuterol.  Paramedics found the patient to be 95% but severely tachypneic, they gave her another albuterol treatment which the patient states does give her some temporary relief.  She is taking albuterol nebulizer treatments at home.  She has not recently been on antibiotics, she was not tested for Covid yesterday at the outside hospital.  The patient's was transported by the paramedics who reported that she had normal sinus rhythm on the monitor, there was no hypotension or fever.  Review of the medical record shows that the patient has not been admitted to the hospital for COPD in over 2 years        Past Medical History:  Diagnosis Date  . ADHD (attention deficit hyperactivity disorder)   . Anginal pain ([place])   . Asthma   . BMI (body mass index) 20.0-29.9 2009 127 LBS  . Chronic abdominal pain 2003   EX LAP APR 2004 RUPTURE L OV CYST, JUL 2004 ADHESIONS  . Chronic nausea   . Clostridium difficile colitis 04/10/11 &11/2011   tx w/ flagyl  . COPD (chronic obstructive pulmonary disease) ([place])   . Degenerative disc disease   . Depression   . GERD (gastroesophageal reflux disease)   . Hiatal hernia 2008   on EGD above  . Irritable bowel syndrome 2004 CONSTIPATION  .  Migraines   . MRSA infection    Dr. [name], currently under treatment  . PTSD (post-traumatic stress disorder)   . PUD (peptic ulcer disease) 01/2007   EGD Dr [name], 2 antral ulcers, negative h pylori  . Rectal prolapse   . S/P colonoscopy 06/02/09   normal ([name])  . S/P endoscopy 03/26/11   gastritis-Dr [name]  . SBO (small bowel obstruction) [facility])     Patient Active Problem List   Diagnosis Date Noted  . Arthritis of right knee 05/06/2017  . Swelling of right lower extremity 04/23/2017  . Acute pain of right knee 01/13/2017  . Left genital labial abscess 10/17/2016  . Labial abscess 10/17/2016  . Anxiety state 03/15/2015  . Anemia 03/14/2015  . COPD (chronic obstructive pulmonary disease) ([place]) 03/13/2015  . Ileus ([place]) 04/18/2014  . Tobacco abuse 04/18/2014  . Hx of adenomatous colonic polyps 09/07/2013  . Muscle spasms of neck 06/09/2013  . Cervical pain 03/17/2013  . C. difficile colitis 06/25/2011  . Chronic nausea 03/13/2011  . DEPRESSION 05/09/2007  . ALLERGIC RHINITIS 05/09/2007  . ASTHMA 05/09/2007  . GERD 05/09/2007  . PEPTIC ULCER DISEASE 05/09/2007  . IBS 05/09/2007  . ARTHRITIS 05/09/2007  . HIP PAIN, LEFT 05/09/2007  . NECK PAIN, CHRONIC 05/09/2007  . LOW BACK PAIN 05/09/2007  . CONSTIPATION, HX OF 05/09/2007  . HX, PERSONAL, PAST NONCOMPLIANCE 05/09/2007  Past Surgical History:  Procedure Laterality Date  . ABDOMINAL HYSTERECTOMY    . APPENDECTOMY    . BACK SURGERY    . BOWEL RESECTION     x2, secondary to adhesions  . CERVICAL SPINE SURGERY    . COLONOSCOPY  03/21/2012    [id number] ADENOMA(1) POOR PREP  . COLONOSCOPY N/A 11/17/2013   [id number] mucosa in the terminal ileum/NORMAL surgical anastomosis/Small internal hemorrhoids  . ESOPHAGOGASTRODUODENOSCOPY  03/23/2011    Normal esophagus without evidence of Barrett's, mass, erosions, or ulcerations./ Patchy erythema with occasional erosion in the antrum.  Biopsies  obtained  via cold forceps to evaluate for H. pylori gastritis/ Small hiatal hernia./Normal duodenal bulb and second portion of the duodenum.  . ESOPHAGOGASTRODUODENOSCOPY  03/21/2012   [id number] Hiatal hernia/ABDOMINALPAIN/DIARRHEA MOST LIKELY DUE TO IBS, GASTRITIS DUODENITIS  . FLEXIBLE SIGMOIDOSCOPY  07/14/2002   Normal limited flexible sigmoidoscopy with stool in the rectum and rectosigmoid precluding a full colonoscopy  . JOINT REPLACEMENT    . LAPAROSCOPIC LYSIS INTESTINAL ADHESIONS    . NECK SURGERY  2009  . PARTIAL HYSTERECTOMY    . 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  . 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   Procedure: RIGHT TOTAL KNEE ARTHROPLASTY;  Surgeon: Marybelle Killings, MD;  Location: [place];  Service: Orthopedics;  Laterality: Right;  . TUBAL LIGATION    . UMBILICAL HERNIA REPAIR  2008   x5  . UPPER GASTROINTESTINAL ENDOSCOPY  APR 2008 [facility] BLEEDING/PAIN   PUD  . UPPER GASTROINTESTINAL ENDOSCOPY  JUL 2012 [facility] PAIN   MILD GASTRITIS     OB History    Gravida  1   Para  1   Term  1   Preterm      AB      Living  1     SAB      TAB      Ectopic      Multiple      Live Births              Family History  Adopted: Yes    Social History   Tobacco Use  . Smoking status: Current Every Day Smoker    Packs/day: 0.04    Years: 10.00    Pack years: 0.40    Types: Cigarettes  . Smokeless tobacco: Never Used  Substance Use Topics  . Alcohol use: Yes    Comment: 05/07/2017 "quit when I was 19; drank for 1 year total"  . Drug use: No    Home Medications Prior to Admission medications   Medication Sig Start Date End Date Taking? Authorizing Provider  acetaminophen-codeine (TYLENOL #3) 300-30 MG tablet Take 1 tablet by mouth every 8 (eight) hours as needed for moderate pain. Patient not taking: Reported on 06/27/2017 06/14/17   Marybelle Killings, MD  albuterol (PROVENTIL) (2.5 MG/3ML) 0.083% nebulizer solution Take 2.5 mg by nebulization every 6 (six) hours as  needed for wheezing or shortness of breath.    [provider]  albuterol (PROVENTIL,VENTOLIN) 90 MCG/ACT inhaler Inhale 2 puffs into the lungs every 6 (six) hours as needed for wheezing or shortness of breath. For shortness of breath    [provider]  doxycycline (VIBRAMYCIN) 100 MG capsule Take 1 capsule (100 mg total) by mouth 2 (two) times daily. 07/18/19   Evalee Jefferson, PA-C  DULoxetine (CYMBALTA) 60 MG capsule Take 120 mg by mouth daily.     [provider]  esomeprazole (NEXIUM) 40 MG capsule Take 1  capsule (40 mg total) by mouth 2 (two) times daily before a meal. 05/17/14   [name], PA-C  gabapentin (NEURONTIN) 300 MG capsule Take 300-600 mg by mouth See admin instructions. Take 300 mg in the AM and 600 mg at bedtime    [provider]  LORazepam (ATIVAN) 1 MG tablet Take 1 mg by mouth 3 (three) times daily as needed. for anxiety 05/23/17   [provider]  mirtazapine (REMERON) 15 MG tablet Take 15 mg by mouth at bedtime as needed (for rest).  08/17/13   [provider]  nicotine (NICODERM CQ - DOSED IN MG/24 HOURS) 14 mg/24hr patch Place 1 patch (14 mg total) onto the skin daily. Patient not taking: Reported on 04/25/2017 10/21/16   Donnamae Jude, MD  omeprazole (PRILOSEC) 20 MG capsule Take 1 capsule (20 mg total) by mouth 2 (two) times daily before a meal. 10/14/19   Horton, Barbette Hair, MD  ondansetron (ZOFRAN ODT) 4 MG disintegrating tablet Take 1 tablet (4 mg total) by mouth every 8 (eight) hours as needed for nausea or vomiting. 10/14/19   Horton, Barbette Hair, MD  oxyCODONE-acetaminophen (PERCOCET/ROXICET) 5-325 MG tablet Take 1 tablet by mouth every 6 (six) hours as needed (moderate to severe pain (when tolerating fluids)). Patient not taking: Reported on 06/27/2017 05/30/17   Marybelle Killings, MD  prazosin (MINIPRESS) 5 MG capsule TAKE ONE CAPSULE BY MOUTH AT BEDTIME FOR PTSD/NIGHTMARES 05/23/17   [provider]  predniSONE  (DELTASONE) 50 MG tablet Take one tablet daily for 5 days. 07/18/19   Evalee Jefferson, PA-C  sucralfate (CARAFATE) 1 g tablet Take 1 tablet (1 g total) by mouth 4 (four) times daily -  with meals and at bedtime. 10/14/19   Horton, Barbette Hair, MD  traZODone (DESYREL) 50 MG tablet TAKE ONE TABLET BY MOUTH AT BEDTIME AS NEEDED SLEEP 05/23/17   [provider]    Allergies    Penicillins, Clarithromycin, Moxifloxacin, Quinolones, Salicylates, Lactose intolerance (gi), Lactulose, Aspirin, Buprenorphine hcl, Ibuprofen, Morphine and related, and Sulfa antibiotics  Review of Systems   Review of Systems  All other systems reviewed and are negative.   Physical Exam Updated Vital Signs BP (!) 119/59   Pulse (!) 101   Temp 98.2 F (36.8 C) (Oral)   Resp (!) 26   Ht 1.651 m (5\' 5" )   Wt 69.9 kg   SpO2 99%   BMI 25.63 kg/m   Physical Exam Vitals and nursing note reviewed.  Constitutional:      General: She is in acute distress.     Appearance: She is well-developed.  HENT:     Head: Normocephalic and atraumatic.     Mouth/Throat:     Pharynx: No oropharyngeal exudate.  Eyes:     General: No scleral icterus.       Right eye: No discharge.        Left eye: No discharge.     Conjunctiva/sclera: Conjunctivae normal.     Pupils: Pupils are equal, round, and reactive to light.  Neck:     Thyroid: No thyromegaly.     Vascular: No JVD.  Cardiovascular:     Rate and Rhythm: Normal rate and regular rhythm.     Heart sounds: Normal heart sounds. No murmur. No friction rub. No gallop.   Pulmonary:     Effort: Respiratory distress present.     Breath sounds: Wheezing present. No rales.     Comments: This patient appears very  uncomfortable speaking in shortened sentences, tachypneic, using accessory muscles and has a very prolonged expiratory phase with diffuse wheezing in all lung fields. Abdominal:     General: Bowel sounds are normal. There is no distension.     Palpations: Abdomen is  soft. There is no mass.     Tenderness: There is no abdominal tenderness.  Musculoskeletal:        General: No tenderness. Normal range of motion.     Cervical back: Normal range of motion and neck supple.  Lymphadenopathy:     Cervical: No cervical adenopathy.  Skin:    General: Skin is warm and dry.     Findings: No erythema or rash.  Neurological:     Mental Status: She is alert.     Coordination: Coordination normal.  Psychiatric:        Behavior: Behavior normal.     ED Results / Procedures / Treatments   Labs (all labs ordered are listed, but only abnormal results are displayed) Labs Reviewed  CBC WITH DIFFERENTIAL/PLATELET - Abnormal; Notable for the following components:      Result Value   Hemoglobin 9.7 (*)    HCT 32.8 (*)    MCH 24.4 (*)    MCHC 29.6 (*)    RDW 21.1 (*)    All other components within normal limits  COMPREHENSIVE METABOLIC PANEL - Abnormal; Notable for the following components:   Sodium 133 (*)    Glucose, Bld 103 (*)    Calcium 8.6 (*)    Total Protein 5.9 (*)    Albumin 3.3 (*)    All other components within normal limits  RESPIRATORY PANEL BY RT PCR (FLU A&B, COVID)  BLOOD GAS, ARTERIAL  TROPONIN I (HIGH SENSITIVITY)    EKG EKG Interpretation  Date/Time:  Monday January 04 2020 15:39:09 EDT Ventricular Rate:  108 PR Interval:    QRS Duration: 97 QT Interval:  347 QTC Calculation: 466 R Axis:   71 Text Interpretation: Sinus tachycardia Low voltage, extremity leads since last tracing no significant change Confirmed by Noemi Chapel [phone]) on 01/04/2020 4:05:26 PM   Radiology DG Chest Port 1 View  Result Date: 01/04/2020 CLINICAL DATA:  Patient with shortness of breath and chest pain. EXAM: PORTABLE CHEST 1 VIEW COMPARISON:  Chest radiograph 12/14/2019 FINDINGS: Monitoring leads overlie the patient. Stable enlarged cardiac and mediastinal contours. Bibasilar heterogeneous opacities. Apical emphysematous change. Thoracic spine  degenerative changes. No pleural effusion or pneumothorax. IMPRESSION: Basilar opacities favored to represent atelectasis. Infection not excluded. Emphysema. Electronically Signed   By: Lovey Newcomer M.D.   On: 01/04/2020 16:18    Procedures Procedures (including critical care time)  Medications Ordered in ED Medications - No data to display  ED Course  I have reviewed the triage vital signs and the nursing notes.  Pertinent labs & imaging results that were available during my care of the patient were reviewed by me and considered in my medical decision making (see chart for details).    MDM Rules/Calculators/A&P                      Given this lady's increasing shortness of breath with wheezing and respiratory distress I suspect she will need to be admitted to the hospital for COPD exacerbation.  Due to the increased amounts of phlegm that she is producing will rule out pneumonia, she is not febrile however she is tachypneic and speaking in shortened sentences and using accessory muscles all suggestive  of a more COPD exacerbation.  The patient is agreeable to the plan.  The patient's Covid test is negative, her labs are rather unremarkable, she states that she does not want to be here, she wants to leave, she has albuterol treatments at home, she has refused albuterol treatments here, her chest x-ray does not show any obvious pneumonia, she has no elevation in her white blood cell count and now states that she is not bringing up any phlegm.  She continues to have wheezing symmetrically, states this is normal for her, oxygenating well at 99%, requesting to go home without any further intervention.  I have talked to the patient but coming back if she gets worse, I do not think she needs to mandatorily stay  [name] was evaluated in Emergency Department on 01/04/2020 for the symptoms described in the history of present illness. She was evaluated in the context of the global COVID-19  pandemic, which necessitated consideration that the patient might be at risk for infection with the SARS-CoV-2 virus that causes COVID-19. Institutional protocols and algorithms that pertain to the evaluation of patients at risk for COVID-19 are in a state of rapid change based on information released by regulatory bodies including the CDC and federal and state organizations. These policies and algorithms were followed during the patient's care in the ED.   Final Clinical Impression(s) / ED Diagnoses Final diagnoses:  COPD exacerbation [facility])    Rx / DC Orders ED Discharge Orders    None       Noemi Chapel, MD 01/04/20 1700

## 2020-01-04 NOTE — Discharge Instructions (Signed)
Your Covid test is negative, please continue to take albuterol, 2 puffs every 4 hours or 1 nebulized treatment every 4 hours as needed, continue prednisone daily for 5 days as prescribed by the other hospital, seek medical exam for severe or worsening symptoms

## 2020-02-01 ENCOUNTER — Encounter (HOSPITAL_COMMUNITY): Payer: Self-pay

## 2020-02-01 ENCOUNTER — Emergency Department (HOSPITAL_COMMUNITY): Payer: Medicare Other

## 2020-02-01 ENCOUNTER — Inpatient Hospital Stay (HOSPITAL_COMMUNITY)
Admission: EM | Admit: 2020-02-01 | Discharge: 2020-02-02 | DRG: 640 | Discharge status: Against Medical Advice | Payer: Medicare Other | Attending: Internal Medicine | Admitting: Internal Medicine

## 2020-02-01 ENCOUNTER — Other Ambulatory Visit: Payer: Self-pay

## 2020-02-01 DIAGNOSIS — J439 Emphysema, unspecified: Secondary | ICD-10-CM | POA: Diagnosis not present

## 2020-02-01 DIAGNOSIS — R0602 Shortness of breath: Secondary | ICD-10-CM

## 2020-02-01 DIAGNOSIS — N19 Unspecified kidney failure: Secondary | ICD-10-CM | POA: Diagnosis present

## 2020-02-01 DIAGNOSIS — G92 Toxic encephalopathy: Secondary | ICD-10-CM | POA: Diagnosis present

## 2020-02-01 DIAGNOSIS — Z8614 Personal history of Methicillin resistant Staphylococcus aureus infection: Secondary | ICD-10-CM

## 2020-02-01 DIAGNOSIS — Z79891 Long term (current) use of opiate analgesic: Secondary | ICD-10-CM

## 2020-02-01 DIAGNOSIS — J441 Chronic obstructive pulmonary disease with (acute) exacerbation: Secondary | ICD-10-CM

## 2020-02-01 DIAGNOSIS — E860 Dehydration: Principal | ICD-10-CM | POA: Diagnosis present

## 2020-02-01 DIAGNOSIS — J449 Chronic obstructive pulmonary disease, unspecified: Secondary | ICD-10-CM | POA: Diagnosis present

## 2020-02-01 DIAGNOSIS — M545 Low back pain: Secondary | ICD-10-CM | POA: Diagnosis present

## 2020-02-01 DIAGNOSIS — G894 Chronic pain syndrome: Secondary | ICD-10-CM | POA: Diagnosis present

## 2020-02-01 DIAGNOSIS — R7989 Other specified abnormal findings of blood chemistry: Secondary | ICD-10-CM | POA: Diagnosis not present

## 2020-02-01 DIAGNOSIS — G9341 Metabolic encephalopathy: Secondary | ICD-10-CM | POA: Diagnosis not present

## 2020-02-01 DIAGNOSIS — F909 Attention-deficit hyperactivity disorder, unspecified type: Secondary | ICD-10-CM | POA: Diagnosis present

## 2020-02-01 DIAGNOSIS — F17210 Nicotine dependence, cigarettes, uncomplicated: Secondary | ICD-10-CM | POA: Diagnosis present

## 2020-02-01 DIAGNOSIS — R55 Syncope and collapse: Secondary | ICD-10-CM | POA: Diagnosis present

## 2020-02-01 DIAGNOSIS — S0990XA Unspecified injury of head, initial encounter: Secondary | ICD-10-CM

## 2020-02-01 DIAGNOSIS — K56609 Unspecified intestinal obstruction, unspecified as to partial versus complete obstruction: Secondary | ICD-10-CM | POA: Diagnosis present

## 2020-02-01 DIAGNOSIS — W1830XA Fall on same level, unspecified, initial encounter: Secondary | ICD-10-CM | POA: Diagnosis present

## 2020-02-01 DIAGNOSIS — Z20822 Contact with and (suspected) exposure to covid-19: Secondary | ICD-10-CM | POA: Diagnosis present

## 2020-02-01 DIAGNOSIS — K589 Irritable bowel syndrome without diarrhea: Secondary | ICD-10-CM | POA: Diagnosis present

## 2020-02-01 DIAGNOSIS — K219 Gastro-esophageal reflux disease without esophagitis: Secondary | ICD-10-CM | POA: Diagnosis present

## 2020-02-01 DIAGNOSIS — Z79899 Other long term (current) drug therapy: Secondary | ICD-10-CM

## 2020-02-01 DIAGNOSIS — F411 Generalized anxiety disorder: Secondary | ICD-10-CM | POA: Diagnosis present

## 2020-02-01 DIAGNOSIS — Z96651 Presence of right artificial knee joint: Secondary | ICD-10-CM | POA: Diagnosis present

## 2020-02-01 DIAGNOSIS — Z8711 Personal history of peptic ulcer disease: Secondary | ICD-10-CM

## 2020-02-01 DIAGNOSIS — Z720 Tobacco use: Secondary | ICD-10-CM

## 2020-02-01 DIAGNOSIS — Z90711 Acquired absence of uterus with remaining cervical stump: Secondary | ICD-10-CM

## 2020-02-01 DIAGNOSIS — S0083XA Contusion of other part of head, initial encounter: Secondary | ICD-10-CM | POA: Diagnosis present

## 2020-02-01 DIAGNOSIS — Z5329 Procedure and treatment not carried out because of patient's decision for other reasons: Secondary | ICD-10-CM | POA: Diagnosis not present

## 2020-02-01 DIAGNOSIS — F4310 Post-traumatic stress disorder, unspecified: Secondary | ICD-10-CM | POA: Diagnosis present

## 2020-02-01 DIAGNOSIS — R410 Disorientation, unspecified: Secondary | ICD-10-CM

## 2020-02-01 DIAGNOSIS — F329 Major depressive disorder, single episode, unspecified: Secondary | ICD-10-CM | POA: Diagnosis present

## 2020-02-01 LAB — CBC WITH DIFFERENTIAL/PLATELET
Abs Immature Granulocytes: 0.11 10*3/uL — ABNORMAL HIGH (ref 0.00–0.07)
Basophils Absolute: 0.1 10*3/uL (ref 0.0–0.1)
Basophils Relative: 1 %
Eosinophils Absolute: 0.3 10*3/uL (ref 0.0–0.5)
Eosinophils Relative: 2 %
HCT: 32.2 % — ABNORMAL LOW (ref 36.0–46.0)
Hemoglobin: 9.1 g/dL — ABNORMAL LOW (ref 12.0–15.0)
Immature Granulocytes: 1 %
Lymphocytes Relative: 12 %
Lymphs Abs: 1.4 10*3/uL (ref 0.7–4.0)
MCH: 22.1 pg — ABNORMAL LOW (ref 26.0–34.0)
MCHC: 28.3 g/dL — ABNORMAL LOW (ref 30.0–36.0)
MCV: 78.3 fL — ABNORMAL LOW (ref 80.0–100.0)
Monocytes Absolute: 0.9 10*3/uL (ref 0.1–1.0)
Monocytes Relative: 8 %
Neutro Abs: 9.2 10*3/uL — ABNORMAL HIGH (ref 1.7–7.7)
Neutrophils Relative %: 76 %
Platelets: 409 10*3/uL — ABNORMAL HIGH (ref 150–400)
RBC: 4.11 MIL/uL (ref 3.87–5.11)
RDW: 19.2 % — ABNORMAL HIGH (ref 11.5–15.5)
WBC: 11.9 10*3/uL — ABNORMAL HIGH (ref 4.0–10.5)
nRBC: 0 % (ref 0.0–0.2)

## 2020-02-01 LAB — URINALYSIS, ROUTINE W REFLEX MICROSCOPIC
Bilirubin Urine: NEGATIVE
Glucose, UA: NEGATIVE mg/dL
Hgb urine dipstick: NEGATIVE
Ketones, ur: NEGATIVE mg/dL
Leukocytes,Ua: NEGATIVE
Nitrite: NEGATIVE
Protein, ur: NEGATIVE mg/dL
Specific Gravity, Urine: 1.025 (ref 1.005–1.030)
pH: 5 (ref 5.0–8.0)

## 2020-02-01 LAB — COMPREHENSIVE METABOLIC PANEL
ALT: 20 U/L (ref 0–44)
AST: 26 U/L (ref 15–41)
Albumin: 4 g/dL (ref 3.5–5.0)
Alkaline Phosphatase: 87 U/L (ref 38–126)
Anion gap: 11 (ref 5–15)
BUN: 16 mg/dL (ref 6–20)
CO2: 24 mmol/L (ref 22–32)
Calcium: 9.1 mg/dL (ref 8.9–10.3)
Chloride: 101 mmol/L (ref 98–111)
Creatinine, Ser: 0.66 mg/dL (ref 0.44–1.00)
GFR calc Af Amer: >60 mL/min (ref >60)
GFR calc non Af Amer: >60 mL/min (ref >60)
Glucose, Bld: 126 mg/dL — ABNORMAL HIGH (ref 70–99)
Potassium: 3.5 mmol/L (ref 3.5–5.1)
Sodium: 136 mmol/L (ref 135–145)
Total Bilirubin: 0.3 mg/dL (ref 0.3–1.2)
Total Protein: 6.9 g/dL (ref 6.5–8.1)

## 2020-02-01 LAB — RAPID URINE DRUG SCREEN, HOSP PERFORMED
Amphetamines: NOT DETECTED
Barbiturates: NOT DETECTED
Benzodiazepines: POSITIVE — AB
Cocaine: NOT DETECTED
Opiates: POSITIVE — AB
Tetrahydrocannabinol: NOT DETECTED

## 2020-02-01 LAB — MAGNESIUM: Magnesium: 2.1 mg/dL (ref 1.7–2.4)

## 2020-02-01 LAB — TROPONIN I (HIGH SENSITIVITY)
Troponin I (High Sensitivity): 4 ng/L (ref <18)
Troponin I (High Sensitivity): 4 ng/L (ref <18)

## 2020-02-01 LAB — RESPIRATORY PANEL BY RT PCR (FLU A&B, COVID)
Influenza A by PCR: NEGATIVE
Influenza B by PCR: NEGATIVE
SARS Coronavirus 2 by RT PCR: NEGATIVE

## 2020-02-01 LAB — ETHANOL: Alcohol, Ethyl (B): <10 mg/dL (ref <10)

## 2020-02-01 LAB — D-DIMER, QUANTITATIVE: D-Dimer, Quant: 0.52 ug/mL-FEU — ABNORMAL HIGH (ref 0.00–0.50)

## 2020-02-01 MED ORDER — ALBUTEROL 90 MCG/ACT IN AERS: 2.0000 | INHALATION_SPRAY | Freq: Four times a day (QID) | RESPIRATORY_TRACT | Status: DC | PRN

## 2020-02-01 MED ORDER — IPRATROPIUM-ALBUTEROL 0.5-2.5 (3) MG/3ML IN SOLN
3.0000 mL | Freq: Four times a day (QID) | RESPIRATORY_TRACT | Status: DC
  Administered 2020-02-02: 3 mL via RESPIRATORY_TRACT
  Filled 2020-02-01: qty 3

## 2020-02-01 MED ORDER — IPRATROPIUM BROMIDE 0.02 % IN SOLN
0.5000 mg | Freq: Once | RESPIRATORY_TRACT | Status: AC
  Administered 2020-02-01: 0.5 mg via RESPIRATORY_TRACT

## 2020-02-01 MED ORDER — ACETAMINOPHEN 325 MG PO TABS
650.0000 mg | ORAL_TABLET | Freq: Four times a day (QID) | ORAL | Status: DC | PRN
  Administered 2020-02-02: 650 mg via ORAL
  Filled 2020-02-01: qty 2

## 2020-02-01 MED ORDER — ALBUTEROL SULFATE (2.5 MG/3ML) 0.083% IN NEBU
5.0000 mg | INHALATION_SOLUTION | Freq: Once | RESPIRATORY_TRACT | Status: AC
  Administered 2020-02-01: 5 mg via RESPIRATORY_TRACT

## 2020-02-01 MED ORDER — SODIUM CHLORIDE 0.9% FLUSH
3.0000 mL | Freq: Two times a day (BID) | INTRAVENOUS | Status: DC
  Administered 2020-02-02 (×2): 3 mL via INTRAVENOUS

## 2020-02-01 MED ORDER — ACETAMINOPHEN 650 MG RE SUPP: 650.0000 mg | Freq: Four times a day (QID) | RECTAL | Status: DC | PRN

## 2020-02-01 MED ORDER — IOHEXOL 350 MG/ML SOLN
100.0000 mL | Freq: Once | INTRAVENOUS | Status: AC | PRN
  Administered 2020-02-01: 100 mL via INTRAVENOUS

## 2020-02-01 MED ORDER — SODIUM CHLORIDE 0.9 % IV BOLUS
1000.0000 mL | Freq: Once | INTRAVENOUS | Status: AC
  Administered 2020-02-01: 1000 mL via INTRAVENOUS

## 2020-02-01 MED ORDER — SODIUM CHLORIDE 0.9 % IV SOLN: INTRAVENOUS | Status: AC

## 2020-02-01 MED ORDER — ALBUTEROL SULFATE (2.5 MG/3ML) 0.083% IN NEBU
2.5000 mg | INHALATION_SOLUTION | Freq: Four times a day (QID) | RESPIRATORY_TRACT | Status: DC | PRN
  Administered 2020-02-02: 2.5 mg via RESPIRATORY_TRACT
  Filled 2020-02-01: qty 3

## 2020-02-01 MED ORDER — NICOTINE 14 MG/24HR TD PT24
14.0000 mg | MEDICATED_PATCH | Freq: Every day | TRANSDERMAL | Status: DC | PRN
  Filled 2020-02-01: qty 1

## 2020-02-01 MED ORDER — NALOXONE HCL 0.4 MG/ML IJ SOLN
0.4000 mg | Freq: Once | INTRAMUSCULAR | Status: AC
  Administered 2020-02-01: 0.4 mg via INTRAVENOUS
  Filled 2020-02-01: qty 1

## 2020-02-01 NOTE — H&P (Signed)
History and Physical    PLEASE NOTE THAT DRAGON DICTATION SOFTWARE WAS USED IN THE CONSTRUCTION OF THIS NOTE.   [name] [id number] DOB: September 06, 1961 DOA: 02/01/2020  PCP: Neale Burly, MD Patient coming from: home   I have personally briefly reviewed patient's old medical records in [place]  Chief Complaint: ground level fall  HPI: [name] is a 59 y.o. female with medical history significant for COPD, chronic tobacco abuse, GERD, generalized anxiety disorder, chronic low back pain in the setting of degenerative disc disease, who is admitted to [facility] on 02/01/2020 with acute encephalopathy after presenting from home to [facility] Emergency Department for evaluation of ground-level fall.   In the context of patient's acute encephalopathy, the following history is provided by the emergency department physician, the patient's friend, [name], who transported her to the emergency room, and via chart review.  The patient reportedly experienced a unwitnessed ground-level fall while ambulating in her kitchen on the morning of 02/01/2020, when she reportedly tripped over an object on the floor in the kitchen.  This reportedly.  Resulted in her falling directly forward, striking her chin as the principal point of contact with the kitchen floor.  No reported loss of consciousness.  The patient reportedly was unable to contact her friend, [name], who then brought her to [facility] ED for further evaluation.   The patient is currently without any acute complaints, including any acute headache or neck discomfort following today's reported fall. She denies any recent chest pain, shortness of breath, diaphoresis, palpitations, dizziness, presyncope, or syncope. Denies any recent subjective fever, chills, rigors, or generalized myalgias. Denies any recent neck stiffness, rhinitis, rhinorrhea, sore throat, cough, nausea, vomiting, abdominal pain, diarrhea, or rash. No recent traveling  or known COVID-19 exposures. Denies dysuria, gross hematuria, or change in urinary urgency/frequency.    History notable for depression for which the patient is reportedly on Cymbalta and gabapentin, generalized anxiety disorder for which the patient also takes as needed Ativan, insomnia, for which outpatient medication list appears to include both as needed trazodone as well as as needed mirtazapine, PTSD for which the patient reportedly takes prazosin, and chronic low back pain in the setting of degenerative disc disease for which the patient reportedly takes as needed Percocet.    ED Course:  Vital signs in the ED were notable for the following: Temperature max 98.8; initial heart rate in the range of 104-111, which decreased to 97-100 following interval IV fluids; initial blood pressure 99/47, which increased to 113/59 following interval IV fluids, as further described below; respiratory rate 15-18; oxygen saturation 96 to 98% on room air.  Labs were notable for the following: CMP notable for the following: Sodium 136, potassium 3.5, bicarbonate 24, anion gap 11, BUN 16, creatinine 0.66 relative to most recent prior creatinine data point of 0.69 on 01/04/2020, glucose 126, calcium 9.1, albumin 4.0, with additional liver enzymes within normal limits.  High-sensitivity troponin I x2 found to be nonelevated at 4.  CBC notable for white blood cell count of 11,900 with 76% neutrophils, hemoglobin 9.1 compared to most recent prior hemoglobin of 9.7 on 01/04/2020, platelets 409.  Urinalysis showed no white blood cells, was nitrate negative, leukocyte Estrace negative, and was notable for specific gravity of 1.025.  Urinary drug screen was positive for benzodiazepines as well as opiates.  Nasopharyngeal COVID-19 PCR swab found to be negative in the ED today.   D-dimer of 0.52 was initially interpreted to represent  mild elevation, and prompted CTA of the chest, which showed underlying emphysematous changes,  bibasilar atelectasis, but no evidence of acute pulmonary embolism, infiltrate, or edema.  Chest x-ray showed no evidence of acute cardiopulmonary process.  CT of the head showed no evidence of acute intracranial process, including no evidence of acute intracranial hemorrhage.  CT maxillofacial with contrast showed no evidence of acute maxillofacial bone fracture.   While in the ED, the following were administered: IV normal saline x1 L bolus; Narcan 0.4 mg IV x1, following which the patient's somnolence reportedly demonstrated slight improvement.     Review of Systems: As per HPI otherwise 10 point review of systems negative.   Past Medical History:  Diagnosis Date  . ADHD (attention deficit hyperactivity disorder)   . Anginal pain ([place])   . Asthma   . BMI (body mass index) 20.0-29.9 2009 127 LBS  . Chronic abdominal pain 2003   EX LAP APR 2004 RUPTURE L OV CYST, JUL 2004 ADHESIONS  . Chronic nausea   . Clostridium difficile colitis 04/10/11 &11/2011   tx w/ flagyl  . COPD (chronic obstructive pulmonary disease) ([place])   . Degenerative disc disease   . Depression   . GERD (gastroesophageal reflux disease)   . Hiatal hernia 2008   on EGD above  . Irritable bowel syndrome 2004 CONSTIPATION  . Migraines   . MRSA infection    Dr. [name], currently under treatment  . PTSD (post-traumatic stress disorder)   . PUD (peptic ulcer disease) 01/2007   EGD Dr [name], 2 antral ulcers, negative h pylori  . Rectal prolapse   . S/P colonoscopy 06/02/09   normal ([name])  . S/P endoscopy 03/26/11   gastritis-Dr [name]  . SBO (small bowel obstruction) ([facility])     Past Surgical History:  Procedure Laterality Date  . ABDOMINAL HYSTERECTOMY    . APPENDECTOMY    . BACK SURGERY    . BOWEL RESECTION     x2, secondary to adhesions  . CERVICAL SPINE SURGERY    . COLONOSCOPY  03/21/2012    [id number] ADENOMA(1) POOR PREP  . COLONOSCOPY N/A 11/17/2013   [id number] mucosa in the terminal  ileum/NORMAL surgical anastomosis/Small internal hemorrhoids  . ESOPHAGOGASTRODUODENOSCOPY  03/23/2011    Normal esophagus without evidence of Barrett's, mass, erosions, or ulcerations./ Patchy erythema with occasional erosion in the antrum.  Biopsies  obtained via cold forceps to evaluate for H. pylori gastritis/ Small hiatal hernia./Normal duodenal bulb and second portion of the duodenum.  . ESOPHAGOGASTRODUODENOSCOPY  03/21/2012   [id number] Hiatal hernia/ABDOMINALPAIN/DIARRHEA MOST LIKELY DUE TO IBS, GASTRITIS DUODENITIS  . FLEXIBLE SIGMOIDOSCOPY  07/14/2002   Normal limited flexible sigmoidoscopy with stool in the rectum and rectosigmoid precluding a full colonoscopy  . JOINT REPLACEMENT    . LAPAROSCOPIC LYSIS INTESTINAL ADHESIONS    . NECK SURGERY  2009  . PARTIAL HYSTERECTOMY    . 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  . 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   Procedure: RIGHT TOTAL KNEE ARTHROPLASTY;  Surgeon: Marybelle Killings, MD;  Location: [facility];  Service: Orthopedics;  Laterality: Right;  . TUBAL LIGATION    . UMBILICAL HERNIA REPAIR  2008   x5  . UPPER GASTROINTESTINAL ENDOSCOPY  APR 2008 [facility] BLEEDING/PAIN   PUD  . UPPER GASTROINTESTINAL ENDOSCOPY  JUL 2012 [facility] PAIN   MILD GASTRITIS    Social History:  reports that she has been smoking cigarettes. She has a 0.40 pack-year smoking history. She has never used smokeless  tobacco. She reports current alcohol use. She reports that she does not use drugs.   Allergies  Allergen Reactions  . Penicillins Anaphylaxis and Other (See Comments)    Childhood allergy  Has patient had a PCN reaction causing immediate rash, facial/tongue/throat swelling, SOB or lightheadedness with hypotension: Yes Has patient had a PCN reaction causing severe rash involving mucus membranes or skin necrosis: No Has patient had a PCN reaction that required hospitalization Yes Has patient had a PCN reaction occurring within the last 10 years: No If all  of the above answers are "NO", then may proceed with Cephalosporin use.   . Clarithromycin Nausea And Vomiting  . Moxifloxacin Nausea And Vomiting  . Quinolones Nausea And Vomiting  . Salicylates Nausea And Vomiting  . Lactose Intolerance (Gi) Diarrhea  . Lactulose Diarrhea  . Aspirin Other (See Comments)    Burning of stomach. REACTION: GI upset  . Buprenorphine Hcl Itching and Other (See Comments)    Suboxone  . Ibuprofen Nausea Only and Other (See Comments)    Severe heartburn, Upset stomach due to acid reflux  . Morphine And Related Itching  . Sulfa Antibiotics Itching    Family History  Adopted: Yes    Prior to Admission medications   Medication Sig Start Date End Date Taking? Authorizing Provider  acetaminophen-codeine (TYLENOL #3) 300-30 MG tablet Take 1 tablet by mouth every 8 (eight) hours as needed for moderate pain. Patient not taking: Reported on 06/27/2017 06/14/17   Marybelle Killings, MD  albuterol (PROVENTIL) (2.5 MG/3ML) 0.083% nebulizer solution Take 2.5 mg by nebulization every 6 (six) hours as needed for wheezing or shortness of breath.    [provider]  albuterol (PROVENTIL,VENTOLIN) 90 MCG/ACT inhaler Inhale 2 puffs into the lungs every 6 (six) hours as needed for wheezing or shortness of breath. For shortness of breath    [provider]  doxycycline (VIBRAMYCIN) 100 MG capsule Take 1 capsule (100 mg total) by mouth 2 (two) times daily. 07/18/19   Evalee Jefferson, PA-C  DULoxetine (CYMBALTA) 60 MG capsule Take 120 mg by mouth daily.     [provider]  esomeprazole (NEXIUM) 40 MG capsule Take 1 capsule (40 mg total) by mouth 2 (two) times daily before a meal. 05/17/14   [name], PA-C  gabapentin (NEURONTIN) 300 MG capsule Take 300-600 mg by mouth See admin instructions. Take 300 mg in the AM and 600 mg at bedtime    [provider]  LORazepam (ATIVAN) 1 MG tablet Take 1 mg by mouth 3 (three) times daily as needed. for anxiety  05/23/17   [provider]  mirtazapine (REMERON) 15 MG tablet Take 15 mg by mouth at bedtime as needed (for rest).  08/17/13   [provider]  nicotine (NICODERM CQ - DOSED IN MG/24 HOURS) 14 mg/24hr patch Place 1 patch (14 mg total) onto the skin daily. Patient not taking: Reported on 04/25/2017 10/21/16   Donnamae Jude, MD  omeprazole (PRILOSEC) 20 MG capsule Take 1 capsule (20 mg total) by mouth 2 (two) times daily before a meal. 10/14/19   Horton, Barbette Hair, MD  ondansetron (ZOFRAN ODT) 4 MG disintegrating tablet Take 1 tablet (4 mg total) by mouth every 8 (eight) hours as needed for nausea or vomiting. 10/14/19   Horton, Barbette Hair, MD  oxyCODONE-acetaminophen (PERCOCET/ROXICET) 5-325 MG tablet Take 1 tablet by mouth every 6 (six) hours as needed (moderate to severe pain (when tolerating fluids)). Patient not taking: Reported  on 06/27/2017 05/30/17   Marybelle Killings, MD  prazosin (MINIPRESS) 5 MG capsule TAKE ONE CAPSULE BY MOUTH AT BEDTIME FOR PTSD/NIGHTMARES 05/23/17   [provider]  predniSONE (DELTASONE) 50 MG tablet Take one tablet daily for 5 days. 07/18/19   Evalee Jefferson, PA-C  sucralfate (CARAFATE) 1 g tablet Take 1 tablet (1 g total) by mouth 4 (four) times daily -  with meals and at bedtime. 10/14/19   Horton, Barbette Hair, MD  traZODone (DESYREL) 50 MG tablet TAKE ONE TABLET BY MOUTH AT BEDTIME AS NEEDED SLEEP 05/23/17   [provider]     Objective    Physical Exam: Vitals:   02/01/20 1158 02/01/20 1200 02/01/20 1400 02/01/20 1518  BP:  (!) 99/47 105/65   Pulse:   97   Resp: 14 19 15 18   Temp:      TempSrc:      SpO2:   97%     General: appears to be stated age; somnolent, briefly opening eyes to verbal stimuli before falling back asleep; exhibits brief verbal responses prompted by questions posed to her; as such she correctly identifies her DOB, the current [place] president, but is unsure as to her current location or the current date; unable to  follow complete instructions. Skin: warm, dry;  Head:  AT/[place] Eyes:  PEARL b/l, EOMI Mouth:  Oral mucosa membranes appear dry, normal dentition Neck: supple; trachea midline Heart:  Mildly tachycardic, but regular; did not appreciate any M/R/G Lungs: Slightly diminished bibasilar breath sounds, but otherwise, CTAB; did not appreciate any wheezes, rales, or rhonchi Abdomen: + BS; soft, ND, NT Vascular: 2+ pedal pulses b/l; 2+ radial pulses b/l Extremities: no peripheral edema, no muscle wasting Neuro: in setting of presenting acute encephalopathy/somnolence, unable to perform full neurologic evaluation at this time, including unable to perform full assessment of strength, sensation, and cranial nerves.  Patient noted to spontaneously move all 4 extremities.   Labs on Admission: I have personally reviewed following labs and imaging studies  CBC: Recent Labs  Lab 02/01/20 1104  WBC 11.9*  NEUTROABS 9.2*  HGB 9.1*  HCT 32.2*  MCV 78.3*  PLT [id number]*   Basic Metabolic Panel: Recent Labs  Lab 02/01/20 1104  NA 136  K 3.5  CL 101  CO2 24  GLUCOSE 126*  BUN 16  CREATININE 0.66  CALCIUM 9.1   GFR: CrCl cannot be calculated (Unknown ideal weight.). Liver Function Tests: Recent Labs  Lab 02/01/20 1104  AST 26  ALT 20  ALKPHOS 87  BILITOT 0.3  PROT 6.9  ALBUMIN 4.0   No results for input(s): LIPASE, AMYLASE in the last 168 hours. No results for input(s): AMMONIA in the last 168 hours. Coagulation Profile: No results for input(s): INR, PROTIME in the last 168 hours. Cardiac Enzymes: No results for input(s): CKTOTAL, CKMB, CKMBINDEX, TROPONINI in the last 168 hours. BNP (last 3 results) No results for input(s): PROBNP in the last 8760 hours. HbA1C: No results for input(s): HGBA1C in the last 72 hours. CBG: No results for input(s): GLUCAP in the last 168 hours. Lipid Profile: No results for input(s): CHOL, HDL, LDLCALC, TRIG, CHOLHDL, LDLDIRECT in the last 72  hours. Thyroid Function Tests: No results for input(s): TSH, T4TOTAL, FREET4, T3FREE, THYROIDAB in the last 72 hours. Anemia Panel: No results for input(s): VITAMINB12, FOLATE, FERRITIN, TIBC, IRON, RETICCTPCT in the last 72 hours. Urine analysis:    Component Value Date/Time   COLORURINE STRAW (A) 02/01/2020 1337  APPEARANCEUR CLEAR 02/01/2020 1337   LABSPEC 1.025 02/01/2020 1337   PHURINE 5.0 02/01/2020 1337   GLUCOSEU NEGATIVE 02/01/2020 1337   GLUCOSEU NEG mg/dL 10/21/2009 [place] 02/01/2020 1337   [place] 02/01/2020 1337   [place] 02/01/2020 1337   PROTEINUR NEGATIVE 02/01/2020 1337   UROBILINOGEN 0.2 12/07/2014 1811   NITRITE NEGATIVE 02/01/2020 1337   LEUKOCYTESUR NEGATIVE 02/01/2020 1337    Radiological Exams on Admission: DG Chest 1 View  Result Date: 02/01/2020 CLINICAL DATA:  Confusion, fall EXAM: CHEST  1 VIEW COMPARISON:  Radiograph 01/04/2020 FINDINGS: Normal mediastinum and cardiac silhouette. Mild central venous pulmonary congestion. Mild bronchitic markings noted at the lung bases. No effusion, infiltrate pneumothorax. Anterior cervical fusion noted. IMPRESSION: 1. No clear acute cardiopulmonary findings. 2. Central venous congestion in basilar bronchitic markings. Electronically Signed   By: Suzy Bouchard M.D.   On: 02/01/2020 12:25   CT Head Wo Contrast  Result Date: 02/01/2020 CLINICAL DATA:  Fall, facial trauma, headache EXAM: CT HEAD WITHOUT CONTRAST CT MAXILLOFACIAL WITHOUT CONTRAST TECHNIQUE: Multidetector CT imaging of the head and maxillofacial structures were performed using the standard protocol without intravenous contrast. Multiplanar CT image reconstructions of the maxillofacial structures were also generated. COMPARISON:  07/06/2018 FINDINGS: CT HEAD FINDINGS Brain: No evidence of acute infarction, hemorrhage, hydrocephalus, extra-axial collection or mass lesion/mass effect. Vascular: No hyperdense vessel or  unexpected calcification. Skull: Normal. Negative for fracture or focal lesion. Other: No scalp hematoma. CT MAXILLOFACIAL FINDINGS Osseous: No fracture or mandibular dislocation. Abdominal walls intact. No destructive process. Orbits: Negative. No traumatic or inflammatory finding. Sinuses: Bilateral median antrectomies. Paranasal sinuses are well aerated without air-fluid level or mucosal thickening. Mastoid air cells are clear. Soft tissues: No focal soft tissue swelling or hematoma. IMPRESSION: 1. No acute intracranial abnormality. 2. No acute maxillofacial bone fracture. Electronically Signed   By: Davina Poke D.O.   On: 02/01/2020 12:25   CT Angio Chest PE W/Cm &/Or Wo Cm  Result Date: 02/01/2020 CLINICAL DATA:  Shortness of breath and syncope EXAM: CT ANGIOGRAPHY CHEST WITH CONTRAST TECHNIQUE: Multidetector CT imaging of the chest was performed using the standard protocol during bolus administration of intravenous contrast. Multiplanar CT image reconstructions and MIPs were obtained to evaluate the vascular anatomy. CONTRAST:  121mL OMNIPAQUE IOHEXOL 350 MG/ML SOLN COMPARISON:  CT angiogram chest October 15, 2019; chest radiograph February 01, 2020 FINDINGS: Cardiovascular: There is no demonstrable pulmonary embolus. There is no appreciable thoracic aortic aneurysm or dissection. Visualized great vessels appear unremarkable. No pericardial effusion or pericardial thickening evident. Mediastinum/Nodes: Thyroid appears unremarkable. There is no appreciable thoracic adenopathy. There is a sizable hiatal type hernia, also present previously. Lungs/Pleura: Underlying centrilobular emphysematous change is again noted. There is atelectatic change and scarring in the lung bases. There is no airspace consolidation. Relative mosaic attenuation in the lungs may be due to redistribution of blood flow to viable lung segments. No pleural effusions are evident. Upper Abdomen: Visualized upper abdominal structures  appear unremarkable. Musculoskeletal: Postoperative changes noted in the lower cervical region. There are foci of degenerative change in the thoracic spine. There are no blastic or lytic bone lesions. There are no evident chest wall lesions. Review of the MIP images confirms the above findings. IMPRESSION: 1. No demonstrable pulmonary embolus. No thoracic aortic aneurysm or dissection. 2. Underlying emphysematous change. Scarring and atelectatic change in lung bases. No edema or airspace consolidation. Mosaic attenuation in the lungs may in part be due to redistribution of blood  flow to viable lung segments. 3.  Sizable hiatal hernia again noted. 4.  No appreciable adenopathy. Emphysema (ICD10-J43.9). Electronically Signed   By: Lowella Grip III M.D.   On: 02/01/2020 12:27   CT Maxillofacial Wo Contrast  Result Date: 02/01/2020 CLINICAL DATA:  Fall, facial trauma, headache EXAM: CT HEAD WITHOUT CONTRAST CT MAXILLOFACIAL WITHOUT CONTRAST TECHNIQUE: Multidetector CT imaging of the head and maxillofacial structures were performed using the standard protocol without intravenous contrast. Multiplanar CT image reconstructions of the maxillofacial structures were also generated. COMPARISON:  07/06/2018 FINDINGS: CT HEAD FINDINGS Brain: No evidence of acute infarction, hemorrhage, hydrocephalus, extra-axial collection or mass lesion/mass effect. Vascular: No hyperdense vessel or unexpected calcification. Skull: Normal. Negative for fracture or focal lesion. Other: No scalp hematoma. CT MAXILLOFACIAL FINDINGS Osseous: No fracture or mandibular dislocation. Abdominal walls intact. No destructive process. Orbits: Negative. No traumatic or inflammatory finding. Sinuses: Bilateral median antrectomies. Paranasal sinuses are well aerated without air-fluid level or mucosal thickening. Mastoid air cells are clear. Soft tissues: No focal soft tissue swelling or hematoma. IMPRESSION: 1. No acute intracranial abnormality. 2.  No acute maxillofacial bone fracture. Electronically Signed   By: Davina Poke D.O.   On: 02/01/2020 12:25    Assessment/Plan    EMMALEA PANAGOPOULOS is a 59 y.o. female with medical history significant for COPD, chronic tobacco abuse, GERD, generalized anxiety disorder, chronic low back pain in the setting of degenerative disc disease, who is admitted to [facility] on 02/01/2020 with acute encephalopathy after presenting from home to [facility] Emergency Department for evaluation of ground-level fall.    Principal Problem:   Acute metabolic encephalopathy Active Problems:   GERD   Tobacco abuse   COPD (chronic obstructive pulmonary disease) ([place])   Fall from ground level   Dehydration   Acute prerenal azotemia   #) Acute encephalopathy: Apparent new onset confusion and somnolence, potentially representing multifactorial sources of acute encephalopathy, including potential metabolic contribution from dehydration in the setting of presenting acute prerenal azotemia as well as elevated specific gravity, in addition to potential toxic contribution from outpatient as needed opioids given transient improvement in somnolence following administration of Narcan.  This is in addition to suspected additional pharmacologic contributions in the setting of outpatient polypharmacy, including the presence of several home central acting medications, including Cymbalta, as needed Ativan, gabapentin, as needed trazodone, as needed mirtazapine, and as needed Percocet.  No evidence to suggest concomitant underlying infectious process at this time, with presenting urinalysis not suggestive of UTI, while chest x-ray showed no evidence of acute cardiopulmonary process.  Additionally, nasopharyngeal COVID-19 PCR performed in the ED today was found to be negative.  Given the patient's documented history of COPD, with CTA of the chest showing evidence of emphysematous findings, differential for patient's presenting  somnolence/confusion also includes possibility of hypercapnic encephalopathy, exacerbated by home central acting medications, including increased risk for respiratory depression stemming from home prn opioid therapy.  Noncontrast CT head showed no evidence of acute intracranial process, and acute ischemic CVA appears less likely at this time given the patient's spontaneous movement of all 4 extremities.  Plan: Nursing bedside swallow screen prior to initiation of diet.  Will allow for washout of home central acting medications, by attempting to hold the following overnight: Cymbalta, as needed Ativan, gabapentin, as needed trazodone, as needed mirtazapine, and as needed Percocet.  As needed Narcan.  We will also hold home PPI.  Check VBG for evaluation of contribution from hypercapnic encephalopathy.  Check TSH.  Repeat CBC and CMP in the morning.  Gentle IV fluids overnight in the setting of suspected dehydration, as further described below.     #) Ground-level mechanical fall: While all details of patient's fall are not completely clear given her presenting acute encephalopathy, it appears that the patient sustained a ground-level mechanical fall without any associated loss of consciousness on the morning of 02/01/2020 when she tripped while attempting to ambulate across her kitchen at home.  It appears that she may have struck her chin aspirins when a contact with the ground below.  CT head and CT maxillofacial demonstrated no evidence of acute process, including no evidence of intracranial hemorrhage or acute fracture.  Polypharmacy, including several central acting outpatient medications may predispose patient to fall.  Plan: Fall precautions.  Work-up and management of presenting acute encephalopathy, as further described above.  Physical therapy consult ordered for the morning.     #) Dehydration: Diagnosis on the basis of appearance of dry oral mucosa membranes as well as prerenal azotemia in  addition to urinalysis demonstrating elevated specific gravity.  Further support of suspected dehydration stemming from improvement in mild initial tachycardia as well as increase in blood pressure following interval administration of IV fluids, as further quantified above.  Does not appear to been associated with acute kidney injury, although will closely monitor ensuing renal function.  Plan: Normal saline at 75 cc/h x 10 hours overnight.  Monitor strict I's and O's and daily weights.  Repeat BMP in the morning.     #) GERD: On omeprazole as an outpatient.  Plan: will hold home PPI in the setting of presenting acute encephalopathy.      #) COPD: In the setting of history of chronic tobacco abuse, the patient has a documented history of COPD.  Outpatient respiratory regimen appears to consist of as needed albuterol.  Presentation does not appear to be associate with acute exacerbation at this time, although ensuing work-up will include checking VBG for evaluation of any contribution from hypercapnic encephalopathy, as further described above.  Plan: Check VBG, as above.  As needed albuterol inhaler.  Will attempt additional chart review to determine if patient has previously undergone formal pulmonary function testing for quantification of severity of underlying COPD. As presenting acute encephalopathy improves, anticipate subsequent counseling of the patient on the importance of complete smoking discontinuation.     #) Chronic tobacco abuse: Documentation that patient is a current smoker, having smoked approximately half pack per day for at least the last 10 years.  Plan: As patient's presenting acute encephalopathy improves, anticipate subsequent counseling of the patient importance of complete smoking discontinuation, particular in the setting of a documented history of COPD.  As needed nicotine patch has been ordered.     #) Depression: On Cymbalta and gabapentin as an  outpatient.  Plan: In the setting of presenting acute encephalopathy with concern for contributions from polypharmacy, including the presence of multiple central acting medications on home medication list, will hold him Cymbalta and gabapentin for now.     #) Generalized anxiety disorder: On Cymbalta as well as as needed Ativan at home.  It appears that the patient has been taking her as needed Ativan, as presenting urinary drug screen is associated with finding of being positive for benzodiazepines.   Plan: In the setting of presenting acute encephalopathy and potential polypharmacy, will hold Cymbalta as well as as needed Ativan for now, as further described above.    DVT prophylaxis:  scd's  Code Status: Full code Family Communication: none Disposition Plan: Per Rounding Team Consults called: none  Admission status: Observation; med telemetry.    PLEASE NOTE THAT DRAGON DICTATION SOFTWARE WAS USED IN THE CONSTRUCTION OF THIS NOTE.   [facility] Pager [phone] From 3PM- 11PM.   Otherwise, please contact night-coverage  [email or web address] Password [email or web address]  02/01/2020, 4:20 PM

## 2020-02-01 NOTE — ED Notes (Signed)
Pt is more alert and squirming around in bed after narcan was administered.

## 2020-02-01 NOTE — ED Provider Notes (Signed)
[facility] Provider Note   CSN: [id number] Arrival date & time: 02/01/20  1029     History Chief Complaint  Patient presents with  . Fall  . Altered Mental Status   LEVEL 5 CAVEAT - ALTERED MENTAL STATUS  [name] is a 59 y.o. female with PMHx COPD who presents to the ED today from personal vehicle with complaint of fall and AMS. Per triage report pt fell in the parking lot but cannot tell nursing staff where she fell. Pt reports that she thinks she passed out. She states she felt short of breath and then passed out. Pt has a bruise to her chin. She is complaining of diffuse pain all over. She is not anticoagulated. She denies chest pain, severe headache, blurry vision, double vision, weakness/numbness unilaterally, or any other associated symptoms. Pt denies any alcohol use or drug use. Reports she got her COVID vaccine yesterday.   The history is provided by the patient and medical records.       Past Medical History:  Diagnosis Date  . ADHD (attention deficit hyperactivity disorder)   . Anginal pain ([place])   . Asthma   . BMI (body mass index) 20.0-29.9 2009 127 LBS  . Chronic abdominal pain 2003   EX LAP APR 2004 RUPTURE L OV CYST, JUL 2004 ADHESIONS  . Chronic nausea   . Clostridium difficile colitis 04/10/11 &11/2011   tx w/ flagyl  . COPD (chronic obstructive pulmonary disease) ([place])   . Degenerative disc disease   . Depression   . GERD (gastroesophageal reflux disease)   . Hiatal hernia 2008   on EGD above  . Irritable bowel syndrome 2004 CONSTIPATION  . Migraines   . MRSA infection    Dr. [name], currently under treatment  . PTSD (post-traumatic stress disorder)   . PUD (peptic ulcer disease) 01/2007   EGD Dr [name], 2 antral ulcers, negative h pylori  . Rectal prolapse   . S/P colonoscopy 06/02/09   normal ([name])  . S/P endoscopy 03/26/11   gastritis-Dr [name]  . SBO (small bowel obstruction) [facility])     Patient  Active Problem List   Diagnosis Date Noted  . Arthritis of right knee 05/06/2017  . Swelling of right lower extremity 04/23/2017  . Acute pain of right knee 01/13/2017  . Left genital labial abscess 10/17/2016  . Labial abscess 10/17/2016  . Anxiety state 03/15/2015  . Anemia 03/14/2015  . COPD (chronic obstructive pulmonary disease) ([place]) 03/13/2015  . Ileus ([place]) 04/18/2014  . Tobacco abuse 04/18/2014  . Hx of adenomatous colonic polyps 09/07/2013  . Muscle spasms of neck 06/09/2013  . Cervical pain 03/17/2013  . C. difficile colitis 06/25/2011  . Chronic nausea 03/13/2011  . DEPRESSION 05/09/2007  . ALLERGIC RHINITIS 05/09/2007  . ASTHMA 05/09/2007  . GERD 05/09/2007  . PEPTIC ULCER DISEASE 05/09/2007  . IBS 05/09/2007  . ARTHRITIS 05/09/2007  . HIP PAIN, LEFT 05/09/2007  . NECK PAIN, CHRONIC 05/09/2007  . LOW BACK PAIN 05/09/2007  . CONSTIPATION, HX OF 05/09/2007  . HX, PERSONAL, PAST NONCOMPLIANCE 05/09/2007    Past Surgical History:  Procedure Laterality Date  . ABDOMINAL HYSTERECTOMY    . APPENDECTOMY    . BACK SURGERY    . BOWEL RESECTION     x2, secondary to adhesions  . CERVICAL SPINE SURGERY    . COLONOSCOPY  03/21/2012    [id number] ADENOMA(1) POOR PREP  . COLONOSCOPY N/A 11/17/2013   [id number] mucosa in  the terminal ileum/NORMAL surgical anastomosis/Small internal hemorrhoids  . ESOPHAGOGASTRODUODENOSCOPY  03/23/2011    Normal esophagus without evidence of Barrett's, mass, erosions, or ulcerations./ Patchy erythema with occasional erosion in the antrum.  Biopsies  obtained via cold forceps to evaluate for H. pylori gastritis/ Small hiatal hernia./Normal duodenal bulb and second portion of the duodenum.  . ESOPHAGOGASTRODUODENOSCOPY  03/21/2012   [id number] Hiatal hernia/ABDOMINALPAIN/DIARRHEA MOST LIKELY DUE TO IBS, GASTRITIS DUODENITIS  . FLEXIBLE SIGMOIDOSCOPY  07/14/2002   Normal limited flexible sigmoidoscopy with stool in the rectum and rectosigmoid  precluding a full colonoscopy  . JOINT REPLACEMENT    . LAPAROSCOPIC LYSIS INTESTINAL ADHESIONS    . NECK SURGERY  2009  . PARTIAL HYSTERECTOMY    . 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  . 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   Procedure: RIGHT TOTAL KNEE ARTHROPLASTY;  Surgeon: Marybelle Killings, MD;  Location: [facility];  Service: Orthopedics;  Laterality: Right;  . TUBAL LIGATION    . UMBILICAL HERNIA REPAIR  2008   x5  . UPPER GASTROINTESTINAL ENDOSCOPY  APR 2008 [facility] BLEEDING/PAIN   PUD  . UPPER GASTROINTESTINAL ENDOSCOPY  JUL 2012 [facility] PAIN   MILD GASTRITIS     OB History    Gravida  1   Para  1   Term  1   Preterm      AB      Living  1     SAB      TAB      Ectopic      Multiple      Live Births              Family History  Adopted: Yes    Social History   Tobacco Use  . Smoking status: Current Every Day Smoker    Packs/day: 0.04    Years: 10.00    Pack years: 0.40    Types: Cigarettes  . Smokeless tobacco: Never Used  Substance Use Topics  . Alcohol use: Yes    Comment: 05/07/2017 "quit when I was 19; drank for 1 year total"  . Drug use: No    Home Medications Prior to Admission medications   Medication Sig Start Date End Date Taking? Authorizing Provider  acetaminophen-codeine (TYLENOL #3) 300-30 MG tablet Take 1 tablet by mouth every 8 (eight) hours as needed for moderate pain. Patient not taking: Reported on 06/27/2017 06/14/17   Marybelle Killings, MD  albuterol (PROVENTIL) (2.5 MG/3ML) 0.083% nebulizer solution Take 2.5 mg by nebulization every 6 (six) hours as needed for wheezing or shortness of breath.    [provider]  albuterol (PROVENTIL,VENTOLIN) 90 MCG/ACT inhaler Inhale 2 puffs into the lungs every 6 (six) hours as needed for wheezing or shortness of breath. For shortness of breath    [provider]  doxycycline (VIBRAMYCIN) 100 MG capsule Take 1 capsule (100 mg total) by mouth 2 (two) times daily. 07/18/19   Evalee Jefferson, PA-C  DULoxetine (CYMBALTA) 60 MG capsule Take 120 mg by mouth daily.     [provider]  esomeprazole (NEXIUM) 40 MG capsule Take 1 capsule (40 mg total) by mouth 2 (two) times daily before a meal. 05/17/14   [name], PA-C  gabapentin (NEURONTIN) 300 MG capsule Take 300-600 mg by mouth See admin instructions. Take 300 mg in the AM and 600 mg at bedtime    [provider]  LORazepam (ATIVAN) 1 MG tablet Take 1 mg by mouth 3 (three) times  daily as needed. for anxiety 05/23/17   [provider]  mirtazapine (REMERON) 15 MG tablet Take 15 mg by mouth at bedtime as needed (for rest).  08/17/13   [provider]  nicotine (NICODERM CQ - DOSED IN MG/24 HOURS) 14 mg/24hr patch Place 1 patch (14 mg total) onto the skin daily. Patient not taking: Reported on 04/25/2017 10/21/16   Donnamae Jude, MD  omeprazole (PRILOSEC) 20 MG capsule Take 1 capsule (20 mg total) by mouth 2 (two) times daily before a meal. 10/14/19   Horton, Barbette Hair, MD  ondansetron (ZOFRAN ODT) 4 MG disintegrating tablet Take 1 tablet (4 mg total) by mouth every 8 (eight) hours as needed for nausea or vomiting. 10/14/19   Horton, Barbette Hair, MD  oxyCODONE-acetaminophen (PERCOCET/ROXICET) 5-325 MG tablet Take 1 tablet by mouth every 6 (six) hours as needed (moderate to severe pain (when tolerating fluids)). Patient not taking: Reported on 06/27/2017 05/30/17   Marybelle Killings, MD  prazosin (MINIPRESS) 5 MG capsule TAKE ONE CAPSULE BY MOUTH AT BEDTIME FOR PTSD/NIGHTMARES 05/23/17   [provider]  predniSONE (DELTASONE) 50 MG tablet Take one tablet daily for 5 days. 07/18/19   Evalee Jefferson, PA-C  sucralfate (CARAFATE) 1 g tablet Take 1 tablet (1 g total) by mouth 4 (four) times daily -  with meals and at bedtime. 10/14/19   Horton, Barbette Hair, MD  traZODone (DESYREL) 50 MG tablet TAKE ONE TABLET BY MOUTH AT BEDTIME AS NEEDED SLEEP 05/23/17   [provider]    Allergies      Penicillins, Clarithromycin, Moxifloxacin, Quinolones, Salicylates, Lactose intolerance (gi), Lactulose, Aspirin, Buprenorphine hcl, Ibuprofen, Morphine and related, and Sulfa antibiotics  Review of Systems   Review of Systems  Unable to perform ROS: Mental status change  Respiratory: Positive for shortness of breath.   Neurological: Positive for syncope.    Physical Exam Updated Vital Signs BP (!) 85/40   Pulse (!) 117   Temp 98.8 F (37.1 C) (Oral)   Resp 20   SpO2 93%   Physical Exam Vitals and nursing note reviewed.  Constitutional:      Appearance: She is not ill-appearing or diaphoretic.  HENT:     Head: Normocephalic.     Comments: Ecchymosis noted to chin with TTP. No malocclusion. No raccoon's sign or battle's sign. Negative hemotympanum bilaterally.  Eyes:     Extraocular Movements: Extraocular movements intact.     Conjunctiva/sclera: Conjunctivae normal.     Pupils: Pupils are equal, round, and reactive to light.  Cardiovascular:     Rate and Rhythm: Regular rhythm. Tachycardia present.     Pulses: Normal pulses.  Pulmonary:     Effort: Pulmonary effort is normal.     Breath sounds: Normal breath sounds. No wheezing, rhonchi or rales.  Abdominal:     Palpations: Abdomen is soft.     Tenderness: There is no abdominal tenderness. There is no guarding or rebound.  Musculoskeletal:     Cervical back: Neck supple.  Skin:    General: Skin is warm and dry.  Neurological:     Mental Status: She is alert.     Comments: CN 3-12 grossly intact Alert to person, place, and situation. Able to report that Barbette Or is the president however cannot recall the year or day of the week GCS 15 Sensation and strength intact Coordination with finger-to-nose WNL Neg pronator drift     ED Results / Procedures / Treatments   Labs (all labs  ordered are listed, but only abnormal results are displayed) Labs Reviewed  COMPREHENSIVE METABOLIC PANEL - Abnormal; Notable for the  following components:      Result Value   Glucose, Bld 126 (*)    All other components within normal limits  CBC WITH DIFFERENTIAL/PLATELET - Abnormal; Notable for the following components:   WBC 11.9 (*)    Hemoglobin 9.1 (*)    HCT 32.2 (*)    MCV 78.3 (*)    MCH 22.1 (*)    MCHC 28.3 (*)    RDW 19.2 (*)    Platelets 409 (*)    Neutro Abs 9.2 (*)    Abs Immature Granulocytes 0.11 (*)    All other components within normal limits  D-DIMER, QUANTITATIVE (NOT AT [facility]) - Abnormal; Notable for the following components:   D-Dimer, Quant 0.52 (*)    All other components within normal limits  URINALYSIS, ROUTINE W REFLEX MICROSCOPIC - Abnormal; Notable for the following components:   Color, Urine STRAW (*)    All other components within normal limits  RAPID URINE DRUG SCREEN, HOSP PERFORMED - Abnormal; Notable for the following components:   Opiates POSITIVE (*)    Benzodiazepines POSITIVE (*)    All other components within normal limits  RESPIRATORY PANEL BY RT PCR (FLU A&B, COVID)  ETHANOL  TROPONIN I (HIGH SENSITIVITY)  TROPONIN I (HIGH SENSITIVITY)    EKG EKG Interpretation  Date/Time:  Monday February 01 2020 10:43:25 EDT Ventricular Rate:  114 PR Interval:  124 QRS Duration: 74 QT Interval:  354 QTC Calculation: 487 R Axis:   75 Text Interpretation: Sinus tachycardia Cannot rule out Anterior infarct , age undetermined Abnormal ECG Confirmed by Fredia Sorrow [id number]) on 02/01/2020 10:51:08 AM   Radiology DG Chest 1 View  Result Date: 02/01/2020 CLINICAL DATA:  Confusion, fall EXAM: CHEST  1 VIEW COMPARISON:  Radiograph 01/04/2020 FINDINGS: Normal mediastinum and cardiac silhouette. Mild central venous pulmonary congestion. Mild bronchitic markings noted at the lung bases. No effusion, infiltrate pneumothorax. Anterior cervical fusion noted. IMPRESSION: 1. No clear acute cardiopulmonary findings. 2. Central venous congestion in basilar bronchitic markings. Electronically  Signed   By: Suzy Bouchard M.D.   On: 02/01/2020 12:25   CT Head Wo Contrast  Result Date: 02/01/2020 CLINICAL DATA:  Fall, facial trauma, headache EXAM: CT HEAD WITHOUT CONTRAST CT MAXILLOFACIAL WITHOUT CONTRAST TECHNIQUE: Multidetector CT imaging of the head and maxillofacial structures were performed using the standard protocol without intravenous contrast. Multiplanar CT image reconstructions of the maxillofacial structures were also generated. COMPARISON:  07/06/2018 FINDINGS: CT HEAD FINDINGS Brain: No evidence of acute infarction, hemorrhage, hydrocephalus, extra-axial collection or mass lesion/mass effect. Vascular: No hyperdense vessel or unexpected calcification. Skull: Normal. Negative for fracture or focal lesion. Other: No scalp hematoma. CT MAXILLOFACIAL FINDINGS Osseous: No fracture or mandibular dislocation. Abdominal walls intact. No destructive process. Orbits: Negative. No traumatic or inflammatory finding. Sinuses: Bilateral median antrectomies. Paranasal sinuses are well aerated without air-fluid level or mucosal thickening. Mastoid air cells are clear. Soft tissues: No focal soft tissue swelling or hematoma. IMPRESSION: 1. No acute intracranial abnormality. 2. No acute maxillofacial bone fracture. Electronically Signed   By: Davina Poke D.O.   On: 02/01/2020 12:25   CT Angio Chest PE W/Cm &/Or Wo Cm  Result Date: 02/01/2020 CLINICAL DATA:  Shortness of breath and syncope EXAM: CT ANGIOGRAPHY CHEST WITH CONTRAST TECHNIQUE: Multidetector CT imaging of the chest was performed using the standard protocol during bolus administration of intravenous  contrast. Multiplanar CT image reconstructions and MIPs were obtained to evaluate the vascular anatomy. CONTRAST:  167mL OMNIPAQUE IOHEXOL 350 MG/ML SOLN COMPARISON:  CT angiogram chest October 15, 2019; chest radiograph February 01, 2020 FINDINGS: Cardiovascular: There is no demonstrable pulmonary embolus. There is no appreciable thoracic  aortic aneurysm or dissection. Visualized great vessels appear unremarkable. No pericardial effusion or pericardial thickening evident. Mediastinum/Nodes: Thyroid appears unremarkable. There is no appreciable thoracic adenopathy. There is a sizable hiatal type hernia, also present previously. Lungs/Pleura: Underlying centrilobular emphysematous change is again noted. There is atelectatic change and scarring in the lung bases. There is no airspace consolidation. Relative mosaic attenuation in the lungs may be due to redistribution of blood flow to viable lung segments. No pleural effusions are evident. Upper Abdomen: Visualized upper abdominal structures appear unremarkable. Musculoskeletal: Postoperative changes noted in the lower cervical region. There are foci of degenerative change in the thoracic spine. There are no blastic or lytic bone lesions. There are no evident chest wall lesions. Review of the MIP images confirms the above findings. IMPRESSION: 1. No demonstrable pulmonary embolus. No thoracic aortic aneurysm or dissection. 2. Underlying emphysematous change. Scarring and atelectatic change in lung bases. No edema or airspace consolidation. Mosaic attenuation in the lungs may in part be due to redistribution of blood flow to viable lung segments. 3.  Sizable hiatal hernia again noted. 4.  No appreciable adenopathy. Emphysema (ICD10-J43.9). Electronically Signed   By: Lowella Grip III M.D.   On: 02/01/2020 12:27   CT Maxillofacial Wo Contrast  Result Date: 02/01/2020 CLINICAL DATA:  Fall, facial trauma, headache EXAM: CT HEAD WITHOUT CONTRAST CT MAXILLOFACIAL WITHOUT CONTRAST TECHNIQUE: Multidetector CT imaging of the head and maxillofacial structures were performed using the standard protocol without intravenous contrast. Multiplanar CT image reconstructions of the maxillofacial structures were also generated. COMPARISON:  07/06/2018 FINDINGS: CT HEAD FINDINGS Brain: No evidence of acute  infarction, hemorrhage, hydrocephalus, extra-axial collection or mass lesion/mass effect. Vascular: No hyperdense vessel or unexpected calcification. Skull: Normal. Negative for fracture or focal lesion. Other: No scalp hematoma. CT MAXILLOFACIAL FINDINGS Osseous: No fracture or mandibular dislocation. Abdominal walls intact. No destructive process. Orbits: Negative. No traumatic or inflammatory finding. Sinuses: Bilateral median antrectomies. Paranasal sinuses are well aerated without air-fluid level or mucosal thickening. Mastoid air cells are clear. Soft tissues: No focal soft tissue swelling or hematoma. IMPRESSION: 1. No acute intracranial abnormality. 2. No acute maxillofacial bone fracture. Electronically Signed   By: Davina Poke D.O.   On: 02/01/2020 12:25    Procedures Procedures (including critical care time)  Medications Ordered in ED Medications  sodium chloride 0.9 % bolus 1,000 mL (0 mLs Intravenous Stopped 02/01/20 1414)  iohexol (OMNIPAQUE) 350 MG/ML injection 100 mL (100 mLs Intravenous Contrast Given 02/01/20 1148)  naloxone (NARCAN) injection 0.4 mg (0.4 mg Intravenous Given 02/01/20 1359)    ED Course  I have reviewed the triage vital signs and the nursing notes.  Pertinent labs & imaging results that were available during my care of the patient were reviewed by me and considered in my medical decision making (see chart for details).  Clinical Course as of Jan 31 1622  Mon Feb 01, 2020  1123 D-Dimer, Quant(!): 0.52 [MV]  1617 Pulse Rate(!): 117 [MV]    Clinical Course User Index [MV] [name], PA-C   MDM Rules/Calculators/A&P                      58 year old female  presenting to the ED with fall per triage report. Per pt she believes she had a syncopal episode. She states she felt SOB prior. On arrival to the ED pt is afebrile however tachycardic in the 110s and blood pressure hypotensive at 85/40. She is noted to be confused. Pt continues to report "I just  don't know." She is oriented to self, place, and situation. She cannot recall the year. Other than some confusion to questioning pt has no focal neuro deficits on exam. Easily follows commands. No facial droop. No concern for stroke at this time. She has a bruise to her chin. No other signs of head trauma. Pt is not anticoagulated. She is complaining of pain all over however has chronic pain. Given SOB will work up for ACS as well as PE with sudden syncope. CT Head, CT Maxillofacial, and CTA ordered. Will obtain labs as well. Fluids ordered given hypotension however prior to fluids systolic BP improved to [id number]. Will continue to monitor in the ED.   Additional information obtained from friend Rush Landmark - reports he was on his way to pick pt up from her home. He waited in the car but she did not come per her usual time so he went to go find her and found her laying down on the concrete. He states that she was acting "jittery" but is unsure if she seemed confused. He does mention he took pt to get labs drawn earlier this morning prior to bringing her to the ED. He is unsure what the labs were for.   Of note pt has allergy listed to buprenorphine. Last UDS in our system in 2008 positive for amphetamines and THC. UDS added.   CT Head, CT maxillofacial, and CTA negative at this time.   CBC with mild leukocytosis 11.9, suspect likely acute phase reactant. Pt is not septic appearing.  CMP with glucose 126. No other findings.  Troponin of 4.  ETOH negative.   Upon reevaluation pt laying in bed; she appears sleepy. She is easily arousable however. When asked why she has an allergy to buprenorphine and if she has used drugs in the past pt puts her head down and goes back to "sleep." 0.4 mg Narcan IV ordered as their is suspicion for altered level of consciousness related to possible substance abuse. PDMP reviewed; pt takes xanax and norco. Question if she is taking more than recommended amount vs other substances.    Narcan given. Shortly afterwards pt became more alert and starting rustling around in bed per nursing staff. On reevaluation pt sitting upright in bed. Still appears somewhat drowsy but more alert than before. She continues to deny drug use at home. UDS positive for opiates and benzos consistent with the meds pt has at home per PDMP. Concern that she is taken more than prescribed. Had initially ordered MRI with concern for intracranial abnormality and negative CTs however given change in status after narcan suspect this is more likely related to pt's presentation today. MRI capability not here on Mondays and do not think pt needs emergent MRI at this time requiring transfer. Will ambulate for safety prior to discharge home.   Pt unable to safely ambulate. Even with 2 assistances she is still very unsteady on her feet. Per pt's friend Rush Landmark pt is "sometimes unsteady." She does not typically ambulate with a walker or another assisted device. Spoke with pt's sister in law as well who reports she "takes a lot of medications." She is unsure what pt normally  takes however states she is able to normally ambulate without assistance. Given this feel pt would benefit from admission at this time. Will consult hospitalist.   Discussed case with Dr. Velia Meyer who agrees to accept patient for admission. COVID test ordered.   This note was prepared using Dragon voice recognition software and may include unintentional dictation errors due to the inherent limitations of voice recognition software.  Final Clinical Impression(s) / ED Diagnoses Final diagnoses:  Confusion  Injury of head, initial encounter    Rx / DC Orders ED Discharge Orders    None        [name], PA-C 02/01/20 1624    Fredia Sorrow, MD 02/06/20 [phone]

## 2020-02-01 NOTE — ED Provider Notes (Signed)
Medical screening examination/treatment/procedure(s) were conducted as a shared visit with non-physician practitioner(s) and myself.  I personally evaluated the patient during the encounter.  EKG Interpretation  Date/Time:  Monday February 01 2020 10:43:25 EDT Ventricular Rate:  114 PR Interval:  124 QRS Duration: 74 QT Interval:  354 QTC Calculation: 487 R Axis:   75 Text Interpretation: Sinus tachycardia Cannot rule out Anterior infarct , age undetermined Abnormal ECG Confirmed by Fredia Sorrow [phone]) on 02/01/2020 10:51:08 AM   Patient seen by me along with physician assistant.  Patient brought in by EMS for fall or syncope or seizure that occurred.  Initial blood pressure was low here.  But now is back to normal.  Patient is prescribed pain medication as well as Xanax.  Urine drug screen positive for opiates and benzos.  Patient became very somnolent shortly after she was here.  Patient was given some Narcan which woke her up significantly.  With the fall she definitely did hit her chin jaw she has a lot of bruising there.  So CT head and face without any acute injuries.  Patient also had CT angio chest for an elevated D-dimer.  Although age-adjusted not exactly high but clinically there was high concern so CT angio chest was done.  Did not show any pulmonary embolus.  No other significant findings on the CTA.  Patient probably took too much pain medication.  Now is alert and functional.  Should be stable at this point in time for discharge home.   Fredia Sorrow, MD 02/01/20 1451

## 2020-02-01 NOTE — ED Triage Notes (Signed)
Confused, states she fell in the parking lot but cannot tell us where.

## 2020-02-02 ENCOUNTER — Other Ambulatory Visit: Payer: Self-pay

## 2020-02-02 ENCOUNTER — Observation Stay (HOSPITAL_COMMUNITY): Payer: Medicare Other

## 2020-02-02 DIAGNOSIS — R7989 Other specified abnormal findings of blood chemistry: Secondary | ICD-10-CM | POA: Diagnosis present

## 2020-02-02 DIAGNOSIS — G894 Chronic pain syndrome: Secondary | ICD-10-CM | POA: Diagnosis present

## 2020-02-02 DIAGNOSIS — Z79891 Long term (current) use of opiate analgesic: Secondary | ICD-10-CM | POA: Diagnosis not present

## 2020-02-02 DIAGNOSIS — E860 Dehydration: Secondary | ICD-10-CM | POA: Diagnosis present

## 2020-02-02 DIAGNOSIS — G92 Toxic encephalopathy: Secondary | ICD-10-CM | POA: Diagnosis present

## 2020-02-02 DIAGNOSIS — F17210 Nicotine dependence, cigarettes, uncomplicated: Secondary | ICD-10-CM | POA: Diagnosis present

## 2020-02-02 DIAGNOSIS — Z79899 Other long term (current) drug therapy: Secondary | ICD-10-CM | POA: Diagnosis not present

## 2020-02-02 DIAGNOSIS — R410 Disorientation, unspecified: Secondary | ICD-10-CM | POA: Diagnosis present

## 2020-02-02 DIAGNOSIS — G9341 Metabolic encephalopathy: Secondary | ICD-10-CM | POA: Diagnosis not present

## 2020-02-02 DIAGNOSIS — J441 Chronic obstructive pulmonary disease with (acute) exacerbation: Secondary | ICD-10-CM | POA: Diagnosis present

## 2020-02-02 DIAGNOSIS — K589 Irritable bowel syndrome without diarrhea: Secondary | ICD-10-CM | POA: Diagnosis present

## 2020-02-02 DIAGNOSIS — G9340 Encephalopathy, unspecified: Secondary | ICD-10-CM | POA: Insufficient documentation

## 2020-02-02 DIAGNOSIS — K56609 Unspecified intestinal obstruction, unspecified as to partial versus complete obstruction: Secondary | ICD-10-CM | POA: Diagnosis present

## 2020-02-02 DIAGNOSIS — Z90711 Acquired absence of uterus with remaining cervical stump: Secondary | ICD-10-CM | POA: Diagnosis not present

## 2020-02-02 DIAGNOSIS — W1830XA Fall on same level, unspecified, initial encounter: Secondary | ICD-10-CM | POA: Diagnosis present

## 2020-02-02 DIAGNOSIS — N19 Unspecified kidney failure: Secondary | ICD-10-CM | POA: Diagnosis present

## 2020-02-02 DIAGNOSIS — Z5329 Procedure and treatment not carried out because of patient's decision for other reasons: Secondary | ICD-10-CM | POA: Diagnosis not present

## 2020-02-02 DIAGNOSIS — Z96651 Presence of right artificial knee joint: Secondary | ICD-10-CM | POA: Diagnosis present

## 2020-02-02 DIAGNOSIS — F411 Generalized anxiety disorder: Secondary | ICD-10-CM | POA: Diagnosis present

## 2020-02-02 DIAGNOSIS — Z20822 Contact with and (suspected) exposure to covid-19: Secondary | ICD-10-CM | POA: Diagnosis present

## 2020-02-02 DIAGNOSIS — R55 Syncope and collapse: Secondary | ICD-10-CM | POA: Diagnosis present

## 2020-02-02 DIAGNOSIS — F329 Major depressive disorder, single episode, unspecified: Secondary | ICD-10-CM | POA: Diagnosis present

## 2020-02-02 DIAGNOSIS — S0083XA Contusion of other part of head, initial encounter: Secondary | ICD-10-CM | POA: Diagnosis present

## 2020-02-02 DIAGNOSIS — K219 Gastro-esophageal reflux disease without esophagitis: Secondary | ICD-10-CM | POA: Diagnosis present

## 2020-02-02 DIAGNOSIS — Z8614 Personal history of Methicillin resistant Staphylococcus aureus infection: Secondary | ICD-10-CM | POA: Diagnosis not present

## 2020-02-02 DIAGNOSIS — F4310 Post-traumatic stress disorder, unspecified: Secondary | ICD-10-CM | POA: Diagnosis present

## 2020-02-02 DIAGNOSIS — M545 Low back pain: Secondary | ICD-10-CM | POA: Diagnosis present

## 2020-02-02 DIAGNOSIS — Z8711 Personal history of peptic ulcer disease: Secondary | ICD-10-CM | POA: Diagnosis not present

## 2020-02-02 DIAGNOSIS — F909 Attention-deficit hyperactivity disorder, unspecified type: Secondary | ICD-10-CM | POA: Diagnosis present

## 2020-02-02 LAB — BLOOD GAS, VENOUS
Acid-Base Excess: 3 mmol/L — ABNORMAL HIGH (ref 0.0–2.0)
Bicarbonate: 26.3 mmol/L (ref 20.0–28.0)
FIO2: 32
O2 Saturation: 68.5 %
Patient temperature: 37
pCO2, Ven: 51.7 mmHg (ref 44.0–60.0)
pH, Ven: 7.353 (ref 7.250–7.430)
pO2, Ven: 40.5 mmHg (ref 32.0–45.0)

## 2020-02-02 LAB — IRON AND TIBC
Iron: 9 ug/dL — ABNORMAL LOW (ref 28–170)
Saturation Ratios: 2 % — ABNORMAL LOW (ref 10.4–31.8)
TIBC: 464 ug/dL — ABNORMAL HIGH (ref 250–450)
UIBC: 455 ug/dL

## 2020-02-02 LAB — CBC WITH DIFFERENTIAL/PLATELET
Abs Immature Granulocytes: 0.05 10*3/uL (ref 0.00–0.07)
Basophils Absolute: 0.1 10*3/uL (ref 0.0–0.1)
Basophils Relative: 1 %
Eosinophils Absolute: 0.3 10*3/uL (ref 0.0–0.5)
Eosinophils Relative: 3 %
HCT: 31.4 % — ABNORMAL LOW (ref 36.0–46.0)
Hemoglobin: 8.7 g/dL — ABNORMAL LOW (ref 12.0–15.0)
Immature Granulocytes: 1 %
Lymphocytes Relative: 10 %
Lymphs Abs: 1 10*3/uL (ref 0.7–4.0)
MCH: 21.8 pg — ABNORMAL LOW (ref 26.0–34.0)
MCHC: 27.7 g/dL — ABNORMAL LOW (ref 30.0–36.0)
MCV: 78.5 fL — ABNORMAL LOW (ref 80.0–100.0)
Monocytes Absolute: 0.8 10*3/uL (ref 0.1–1.0)
Monocytes Relative: 8 %
Neutro Abs: 8 10*3/uL — ABNORMAL HIGH (ref 1.7–7.7)
Neutrophils Relative %: 77 %
Platelets: 366 10*3/uL (ref 150–400)
RBC: 4 MIL/uL (ref 3.87–5.11)
RDW: 19.3 % — ABNORMAL HIGH (ref 11.5–15.5)
WBC: 10.2 10*3/uL (ref 4.0–10.5)
nRBC: 0 % (ref 0.0–0.2)

## 2020-02-02 LAB — BASIC METABOLIC PANEL
Anion gap: 9 (ref 5–15)
BUN: 11 mg/dL (ref 6–20)
CO2: 26 mmol/L (ref 22–32)
Calcium: 8.9 mg/dL (ref 8.9–10.3)
Chloride: 105 mmol/L (ref 98–111)
Creatinine, Ser: 0.52 mg/dL (ref 0.44–1.00)
GFR calc Af Amer: >60 mL/min (ref >60)
GFR calc non Af Amer: >60 mL/min (ref >60)
Glucose, Bld: 101 mg/dL — ABNORMAL HIGH (ref 70–99)
Potassium: 3.5 mmol/L (ref 3.5–5.1)
Sodium: 140 mmol/L (ref 135–145)

## 2020-02-02 LAB — HIV ANTIBODY (ROUTINE TESTING W REFLEX): HIV Screen 4th Generation wRfx: NONREACTIVE

## 2020-02-02 LAB — MAGNESIUM: Magnesium: 2.2 mg/dL (ref 1.7–2.4)

## 2020-02-02 LAB — TSH: TSH: 0.589 u[IU]/mL (ref 0.350–4.500)

## 2020-02-02 LAB — FERRITIN: Ferritin: 4 ng/mL — ABNORMAL LOW (ref 11–307)

## 2020-02-02 LAB — VITAMIN B12: Vitamin B-12: 250 pg/mL (ref 180–914)

## 2020-02-02 LAB — FOLATE: Folate: 38 ng/mL (ref >5.9)

## 2020-02-02 MED ORDER — SODIUM CHLORIDE 0.9 % IV SOLN
510.0000 mg | Freq: Once | INTRAVENOUS | Status: DC
  Filled 2020-02-02: qty 17

## 2020-02-02 MED ORDER — ALPRAZOLAM 0.5 MG PO TABS: 0.5000 mg | ORAL_TABLET | Freq: Four times a day (QID) | ORAL | Status: DC | PRN

## 2020-02-02 MED ORDER — NICOTINE 14 MG/24HR TD PT24
14.0000 mg | MEDICATED_PATCH | Freq: Every day | TRANSDERMAL | Status: DC
  Administered 2020-02-02: 14 mg via TRANSDERMAL

## 2020-02-02 MED ORDER — PANTOPRAZOLE SODIUM 40 MG PO TBEC
40.0000 mg | DELAYED_RELEASE_TABLET | Freq: Every day | ORAL | Status: DC
  Administered 2020-02-02: 40 mg via ORAL
  Filled 2020-02-02: qty 1

## 2020-02-02 MED ORDER — GUAIFENESIN-DM 100-10 MG/5ML PO SYRP
5.0000 mL | ORAL_SOLUTION | ORAL | Status: DC | PRN
  Administered 2020-02-02: 5 mL via ORAL
  Filled 2020-02-02: qty 5

## 2020-02-02 MED ORDER — IPRATROPIUM-ALBUTEROL 0.5-2.5 (3) MG/3ML IN SOLN
3.0000 mL | Freq: Four times a day (QID) | RESPIRATORY_TRACT | Status: DC
  Administered 2020-02-02: 3 mL via RESPIRATORY_TRACT
  Filled 2020-02-02: qty 3

## 2020-02-02 MED ORDER — METHYLPREDNISOLONE SODIUM SUCC 125 MG IJ SOLR
60.0000 mg | Freq: Two times a day (BID) | INTRAMUSCULAR | Status: DC
  Administered 2020-02-02: 60 mg via INTRAVENOUS
  Filled 2020-02-02: qty 2

## 2020-02-02 MED ORDER — ALPRAZOLAM 0.5 MG PO TABS
0.5000 mg | ORAL_TABLET | Freq: Two times a day (BID) | ORAL | Status: DC | PRN
  Administered 2020-02-02: 0.5 mg via ORAL
  Filled 2020-02-02: qty 1

## 2020-02-02 MED ORDER — BUDESONIDE 0.5 MG/2ML IN SUSP
0.5000 mg | Freq: Two times a day (BID) | RESPIRATORY_TRACT | Status: DC
  Administered 2020-02-02: 0.5 mg via RESPIRATORY_TRACT
  Filled 2020-02-02: qty 2

## 2020-02-02 MED ORDER — DULOXETINE HCL 60 MG PO CPEP
60.0000 mg | ORAL_CAPSULE | Freq: Every day | ORAL | Status: DC
  Administered 2020-02-02: 60 mg via ORAL
  Filled 2020-02-02: qty 1

## 2020-02-02 MED ORDER — ENOXAPARIN SODIUM 40 MG/0.4ML ~~LOC~~ SOLN
40.0000 mg | SUBCUTANEOUS | Status: DC
  Administered 2020-02-02: 40 mg via SUBCUTANEOUS
  Filled 2020-02-02: qty 0.4

## 2020-02-02 NOTE — Progress Notes (Signed)
Patient requesting to leave AMA. Patient is oriented to self, time, place and situation.  MD notified and spoke with patient on multiple occassions. Patient states she understands the potential  outcomes of going against medical advice and leaving. AMA paperwork signed and witnessed.  Elodia Florence RN  01/30/2020 @1433 

## 2020-02-02 NOTE — Evaluation (Signed)
Physical Therapy Evaluation Patient Details Name: [name] MRN: [id number] DOB: [birth year] Today's Date: 02/02/2020   History of Present Illness  [name] is a 59 y.o. female with medical history significant for COPD, chronic tobacco abuse, GERD, generalized anxiety disorder, chronic low back pain in the setting of degenerative disc disease, who is admitted to [facility] on 02/01/2020 with acute encephalopathy after presenting from home to [facility] Emergency Department for evaluation of ground-level fall.    Clinical Impression  Patient functioning near baseline for functional mobility and gait. Patient able to complete bed mobility and transfer without physical assist or AD. She becomes minimally unsteady upon standing and while ambulating she demonstrates min impaired dynamic balance without AD. She does not have loss of balance or fall and she is provided with guard assist for safety/balance.  Patient returned to room with nursing present. Patient discharged to care of nursing for ambulation daily as tolerated for length of stay.     Follow Up Recommendations No PT follow up    Equipment Recommendations  None recommended by PT    Recommendations for Other Services       Precautions / Restrictions Precautions Precautions: Fall Restrictions Weight Bearing Restrictions: No      Mobility  Bed Mobility Overal bed mobility: Modified Independent             General bed mobility comments: to transition to seated EOB  Transfers Overall transfer level: Modified independent Equipment used: None             General transfer comment: min unsteadiness upon standing without AD  Ambulation/Gait Ambulation/Gait assistance: Min guard Gait Distance (Feet): 300 Feet Assistive device: None Gait Pattern/deviations: Wide base of support;Decreased step length - right;Decreased step length - left     General Gait Details: min/mod unsteadiness with  ambulating without AD, min guard for safety/balance  Stairs            Wheelchair Mobility    Modified Rankin (Stroke Patients Only)       Balance Overall balance assessment: Mild deficits observed, not formally tested                                           Pertinent Vitals/Pain Pain Assessment: Faces Faces Pain Scale: Hurts a little bit Pain Location: generalized (patient names numerous areas)    Home Living Family/patient expects to be discharged to:: Private residence Living Arrangements: Alone Available Help at Discharge: Family;Available PRN/intermittently Type of Home: Apartment Home Access: Level entry     Home Layout: One level Home Equipment: None      Prior Function Level of Independence: Independent         Comments: 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, no assist with ADL, takes care of her dog     Hand Dominance        Extremity/Trunk Assessment   Upper Extremity Assessment Upper Extremity Assessment: Overall WFL for tasks assessed    Lower Extremity Assessment Lower Extremity Assessment: Overall WFL for tasks assessed    Cervical / Trunk Assessment Cervical / Trunk Assessment: Normal  Communication   Communication: No difficulties  Cognition Arousal/Alertness: Awake/alert Behavior During Therapy: WFL for tasks assessed/performed Overall Cognitive Status: Within Functional Limits for tasks assessed  General Comments      Exercises     Assessment/Plan    PT Assessment Patent does not need any further PT services  PT Problem List         PT Treatment Interventions      PT Goals (Current goals can be found in the Care Plan section)  Acute Rehab PT Goals Patient Stated Goal: Return home PT Goal Formulation: With patient Time For Goal Achievement: 02/02/20 Potential to Achieve Goals: Good    Frequency     Barriers to  discharge        Co-evaluation               AM-PAC PT "6 Clicks" Mobility  Outcome Measure Help needed turning from your back to your side while in a flat bed without using bedrails?: None Help needed moving from lying on your back to sitting on the side of a flat bed without using bedrails?: None Help needed moving to and from a bed to a chair (including a wheelchair)?: A Little Help needed standing up from a chair using your arms (e.g., wheelchair or bedside chair)?: None Help needed to walk in hospital room?: A Little Help needed climbing 3-5 steps with a railing? : A Little 6 Click Score: 21    End of Session Equipment Utilized During Treatment: Gait belt;Oxygen Activity Tolerance: Patient tolerated treatment well Patient left: in bed;with nursing/sitter in room;with call bell/phone within reach;with bed alarm set Nurse Communication: Mobility status PT Visit Diagnosis: Unsteadiness on feet (R26.81);Other abnormalities of gait and mobility (R26.89);Muscle weakness (generalized) (M62.81)    Time: 1345-1401 PT Time Calculation (min) (ACUTE ONLY): 16 min   Charges:   PT Evaluation $PT Eval Low Complexity: 1 Low         2:24 PM, 02/02/20 Mearl Latin PT, DPT Physical Therapist at [facility]

## 2020-02-02 NOTE — Discharge Summary (Signed)
Physician Discharge Summary  [name] [id number] DOB: 1960-11-13 DOA: 02/01/2020  PCP: Neale Burly, MD  Admit date: 02/01/2020 Discharge date: 02/02/2020  Admitted From: Home Disposition:  AGAINST MEDICAL ADVICE     Discharge Condition: AGAINST MEDICAL ADVICE CODE STATUS: full    Brief/Interim Summary: 59 year old female with a history of COPD, tobacco abuse, anxiety/depression, chronic back pain, PTSD, GERD presenting with altered mental status and mechanical fall.  The patient was in the parking lot outside of her apartment.  She was walking to get to her friend who was waiting in the car when she fell hitting her chin on the pavement.  The patient is unable to tell me whether she truly lost consciousness.  Nevertheless, she did not bite her tongue or have bowel or bladder incontinence.  The patient has been complaining of some worsening shortness of breath over the last few days prior to admission.  She continues to smoke 1 pack/day.  She has an approximately 40-pack-year history.  The patient states that she has had some intermittent nausea, vomiting, and diarrhea, but she was unable to tell me the duration or frequency.  Nevertheless, the patient has not had any emesis or diarrhea since hospitalization.  There is been no fevers, chills, hemoptysis, hematochezia, melena, dysuria.  Overall, the patient is a difficult storing.  She is tangential often deflecting any direct answers. In the emergency department, the patient had low-grade temperature of 9 9.1 F with soft blood pressures in the 90s.  The patient was noted to be lethargic and given Narcan with improvement of her mental status.  BMP, LFTs, and CBC were essentially unremarkable.  The patient was admitted for work-up of her encephalopathy.  COVID was neg  Discharge Diagnoses:  Acute toxic/metabolic encephalopathy -Secondary to dehydration and medication effect (opioids, benzodiazepines, other hypnotic  medications) -UA negative for pyuria -CT brain negative -EEG--left AMA before it could be done -MRI brain--left AMA before it could be done -The patient remains confused a.m. 02/02/2020 -Restart reduced dose alprazolam to prevent withdrawal -Minimize hypnotic medications including mirtazapine, trazodone, gabapentin -[id number] -Folic [id number] -[id number] -later in the afternoon on 02/02/20--pt was more alert and lucid -she was A&ox3 -she wanted to leave AMA In speaking with Scot Dock, Scot Dock has demonstrated the ability to understand her medical condition(s) which include passing out, copd exacerbation.  [name] has demonstrated the ability to appreciate how treatment for passing out, copd exacerbation will be beneficial.   [name] has also demonstrated the ability to understand and appreciate how refusal of treatement for passing out, copd exacerbation.  could result in harm, repeat hospitalization, and possibly death.  [name] demonstrates the ability to reason through the risks and benefits of the proposed treatment.  Finally, [name] is able to clearly communicate his/her choice.  Near syncope/syncope -Patient had questionable syncopal episode -Check orthostatic vital signs -Remain on telemetry -Personally reviewed EKG--sinus rhythm, no ST or T wave change  COPD exacerbation -Start DuoNeb -Start IV saline -Start Pulmicort -CTA chest--negative PE or dissection.  No consolidation.  Bibasilar scarring. -Personally reviewed chest x-ray--increased interstitial markings.  No consolidation.  Mechanical fall/gait instability -MRI brain--not done due to leaving AMA -PT evaluation  Chronic Pain Syndrome -PMP AWARE queried -pt receives monthly alprazolam 1mg  #60 and Norco 10/325 #120 from 2 different providers  Depression/anxiety -Restart Cymbalta -holding mirtazapine and trazodone  GERD -Restart Protonix  Discharge  Instructions   Allergies as of 02/02/2020  Reactions   Penicillins Anaphylaxis, Other (See Comments)   Childhood allergy  Has patient had a PCN reaction causing immediate rash, facial/tongue/throat swelling, SOB or lightheadedness with hypotension: Yes Has patient had a PCN reaction causing severe rash involving mucus membranes or skin necrosis: No Has patient had a PCN reaction that required hospitalization Yes Has patient had a PCN reaction occurring within the last 10 years: No If all of the above answers are "NO", then may proceed with Cephalosporin use.   Clarithromycin Nausea And Vomiting   Moxifloxacin Nausea And Vomiting   Quinolones Nausea And Vomiting   Salicylates Nausea And Vomiting   Lactose Intolerance (gi) Diarrhea   Lactulose Diarrhea   Aspirin Other (See Comments)   Burning of stomach. REACTION: GI upset   Buprenorphine Hcl Itching, Other (See Comments)   Suboxone   Ibuprofen Nausea Only, Other (See Comments)   Severe heartburn, Upset stomach due to acid reflux   Morphine And Related Itching   Sulfa Antibiotics Itching      Medication List    STOP taking these medications   acetaminophen-codeine 300-30 MG tablet Commonly known as: TYLENOL #3   doxycycline 100 MG capsule Commonly known as: VIBRAMYCIN   esomeprazole 40 MG capsule Commonly known as: NEXIUM   LORazepam 1 MG tablet Commonly known as: ATIVAN   nicotine 14 mg/24hr patch Commonly known as: NICODERM CQ - dosed in mg/24 hours   oxyCODONE-acetaminophen 5-325 MG tablet Commonly known as: PERCOCET/ROXICET   predniSONE 50 MG tablet Commonly known as: DELTASONE     TAKE these medications   albuterol (2.5 MG/3ML) 0.083% nebulizer solution Commonly known as: PROVENTIL Take 2.5 mg by nebulization every 6 (six) hours as needed for wheezing or shortness of breath.   albuterol 90 MCG/ACT inhaler Commonly known as: PROVENTIL,VENTOLIN Inhale 2 puffs into the lungs every 6 (six) hours as needed  for wheezing or shortness of breath. For shortness of breath   DULoxetine 60 MG capsule Commonly known as: CYMBALTA Take 120 mg by mouth daily.   gabapentin 300 MG capsule Commonly known as: NEURONTIN Take 300-600 mg by mouth See admin instructions. Take 300 mg in the AM and 600 mg at bedtime   mirtazapine 15 MG tablet Commonly known as: REMERON Take 15 mg by mouth at bedtime as needed (for rest).   omeprazole 20 MG capsule Commonly known as: PRILOSEC Take 1 capsule (20 mg total) by mouth 2 (two) times daily before a meal.   ondansetron 4 MG disintegrating tablet Commonly known as: Zofran ODT Take 1 tablet (4 mg total) by mouth every 8 (eight) hours as needed for nausea or vomiting.   prazosin 5 MG capsule Commonly known as: MINIPRESS TAKE ONE CAPSULE BY MOUTH AT BEDTIME FOR PTSD/NIGHTMARES   sucralfate 1 g tablet Commonly known as: Carafate Take 1 tablet (1 g total) by mouth 4 (four) times daily -  with meals and at bedtime.   traZODone 50 MG tablet Commonly known as: DESYREL TAKE ONE TABLET BY MOUTH AT BEDTIME AS NEEDED SLEEP       Allergies  Allergen Reactions  . Penicillins Anaphylaxis and Other (See Comments)    Childhood allergy  Has patient had a PCN reaction causing immediate rash, facial/tongue/throat swelling, SOB or lightheadedness with hypotension: Yes Has patient had a PCN reaction causing severe rash involving mucus membranes or skin necrosis: No Has patient had a PCN reaction that required hospitalization Yes Has patient had a PCN reaction occurring within the last 10 years: No  If all of the above answers are "NO", then may proceed with Cephalosporin use.   . Clarithromycin Nausea And Vomiting  . Moxifloxacin Nausea And Vomiting  . Quinolones Nausea And Vomiting  . Salicylates Nausea And Vomiting  . Lactose Intolerance (Gi) Diarrhea  . Lactulose Diarrhea  . Aspirin Other (See Comments)    Burning of stomach. REACTION: GI upset  . Buprenorphine Hcl  Itching and Other (See Comments)    Suboxone  . Ibuprofen Nausea Only and Other (See Comments)    Severe heartburn, Upset stomach due to acid reflux  . Morphine And Related Itching  . Sulfa Antibiotics Itching    Consultations:  none   Procedures/Studies: DG Chest 1 View  Result Date: 02/01/2020 CLINICAL DATA:  Confusion, fall EXAM: CHEST  1 VIEW COMPARISON:  Radiograph 01/04/2020 FINDINGS: Normal mediastinum and cardiac silhouette. Mild central venous pulmonary congestion. Mild bronchitic markings noted at the lung bases. No effusion, infiltrate pneumothorax. Anterior cervical fusion noted. IMPRESSION: 1. No clear acute cardiopulmonary findings. 2. Central venous congestion in basilar bronchitic markings. Electronically Signed   By: Suzy Bouchard M.D.   On: 02/01/2020 12:25   CT Head Wo Contrast  Result Date: 02/01/2020 CLINICAL DATA:  Fall, facial trauma, headache EXAM: CT HEAD WITHOUT CONTRAST CT MAXILLOFACIAL WITHOUT CONTRAST TECHNIQUE: Multidetector CT imaging of the head and maxillofacial structures were performed using the standard protocol without intravenous contrast. Multiplanar CT image reconstructions of the maxillofacial structures were also generated. COMPARISON:  07/06/2018 FINDINGS: CT HEAD FINDINGS Brain: No evidence of acute infarction, hemorrhage, hydrocephalus, extra-axial collection or mass lesion/mass effect. Vascular: No hyperdense vessel or unexpected calcification. Skull: Normal. Negative for fracture or focal lesion. Other: No scalp hematoma. CT MAXILLOFACIAL FINDINGS Osseous: No fracture or mandibular dislocation. Abdominal walls intact. No destructive process. Orbits: Negative. No traumatic or inflammatory finding. Sinuses: Bilateral median antrectomies. Paranasal sinuses are well aerated without air-fluid level or mucosal thickening. Mastoid air cells are clear. Soft tissues: No focal soft tissue swelling or hematoma. IMPRESSION: 1. No acute intracranial  abnormality. 2. No acute maxillofacial bone fracture. Electronically Signed   By: Davina Poke D.O.   On: 02/01/2020 12:25   CT Angio Chest PE W/Cm &/Or Wo Cm  Result Date: 02/01/2020 CLINICAL DATA:  Shortness of breath and syncope EXAM: CT ANGIOGRAPHY CHEST WITH CONTRAST TECHNIQUE: Multidetector CT imaging of the chest was performed using the standard protocol during bolus administration of intravenous contrast. Multiplanar CT image reconstructions and MIPs were obtained to evaluate the vascular anatomy. CONTRAST:  121mL OMNIPAQUE IOHEXOL 350 MG/ML SOLN COMPARISON:  CT angiogram chest October 15, 2019; chest radiograph February 01, 2020 FINDINGS: Cardiovascular: There is no demonstrable pulmonary embolus. There is no appreciable thoracic aortic aneurysm or dissection. Visualized great vessels appear unremarkable. No pericardial effusion or pericardial thickening evident. Mediastinum/Nodes: Thyroid appears unremarkable. There is no appreciable thoracic adenopathy. There is a sizable hiatal type hernia, also present previously. Lungs/Pleura: Underlying centrilobular emphysematous change is again noted. There is atelectatic change and scarring in the lung bases. There is no airspace consolidation. Relative mosaic attenuation in the lungs may be due to redistribution of blood flow to viable lung segments. No pleural effusions are evident. Upper Abdomen: Visualized upper abdominal structures appear unremarkable. Musculoskeletal: Postoperative changes noted in the lower cervical region. There are foci of degenerative change in the thoracic spine. There are no blastic or lytic bone lesions. There are no evident chest wall lesions. Review of the MIP images confirms the above findings. IMPRESSION: 1. No  demonstrable pulmonary embolus. No thoracic aortic aneurysm or dissection. 2. Underlying emphysematous change. Scarring and atelectatic change in lung bases. No edema or airspace consolidation. Mosaic attenuation in the  lungs may in part be due to redistribution of blood flow to viable lung segments. 3.  Sizable hiatal hernia again noted. 4.  No appreciable adenopathy. Emphysema (ICD10-J43.9). Electronically Signed   By: Lowella Grip III M.D.   On: 02/01/2020 12:27   DG Chest Port 1 View  Result Date: 01/04/2020 CLINICAL DATA:  Patient with shortness of breath and chest pain. EXAM: PORTABLE CHEST 1 VIEW COMPARISON:  Chest radiograph 12/14/2019 FINDINGS: Monitoring leads overlie the patient. Stable enlarged cardiac and mediastinal contours. Bibasilar heterogeneous opacities. Apical emphysematous change. Thoracic spine degenerative changes. No pleural effusion or pneumothorax. IMPRESSION: Basilar opacities favored to represent atelectasis. Infection not excluded. Emphysema. Electronically Signed   By: Lovey Newcomer M.D.   On: 01/04/2020 16:18   CT Maxillofacial Wo Contrast  Result Date: 02/01/2020 CLINICAL DATA:  Fall, facial trauma, headache EXAM: CT HEAD WITHOUT CONTRAST CT MAXILLOFACIAL WITHOUT CONTRAST TECHNIQUE: Multidetector CT imaging of the head and maxillofacial structures were performed using the standard protocol without intravenous contrast. Multiplanar CT image reconstructions of the maxillofacial structures were also generated. COMPARISON:  07/06/2018 FINDINGS: CT HEAD FINDINGS Brain: No evidence of acute infarction, hemorrhage, hydrocephalus, extra-axial collection or mass lesion/mass effect. Vascular: No hyperdense vessel or unexpected calcification. Skull: Normal. Negative for fracture or focal lesion. Other: No scalp hematoma. CT MAXILLOFACIAL FINDINGS Osseous: No fracture or mandibular dislocation. Abdominal walls intact. No destructive process. Orbits: Negative. No traumatic or inflammatory finding. Sinuses: Bilateral median antrectomies. Paranasal sinuses are well aerated without air-fluid level or mucosal thickening. Mastoid air cells are clear. Soft tissues: No focal soft tissue swelling or hematoma.  IMPRESSION: 1. No acute intracranial abnormality. 2. No acute maxillofacial bone fracture. Electronically Signed   By: Davina Poke D.O.   On: 02/01/2020 12:25        Discharge Exam: Vitals:   02/02/20 0800 02/02/20 1322  BP: (!) 106/56   Pulse: 97   Resp: 18   Temp: 97.6 F (36.4 C)   SpO2: 93% 95%   Vitals:   02/02/20 0445 02/02/20 0736 02/02/20 0800 02/02/20 1322  BP:   (!) 106/56   Pulse:   97   Resp:   18   Temp:   97.6 F (36.4 C)   TempSrc:   Oral   SpO2:  99% 93% 95%  Weight:      Height: 5\' 5"  (1.651 m)       General: Pt is alert, awake, not in acute distress Cardiovascular: RRR, S1/S2 +, no rubs, no gallops Respiratory: bibasilar wheeze; bilateral rales Abdominal: Soft, NT, ND, bowel sounds + Extremities: no edema, no cyanosis   The results of significant diagnostics from this hospitalization (including imaging, microbiology, ancillary and laboratory) are listed below for reference.    Significant Diagnostic Studies: DG Chest 1 View  Result Date: 02/01/2020 CLINICAL DATA:  Confusion, fall EXAM: CHEST  1 VIEW COMPARISON:  Radiograph 01/04/2020 FINDINGS: Normal mediastinum and cardiac silhouette. Mild central venous pulmonary congestion. Mild bronchitic markings noted at the lung bases. No effusion, infiltrate pneumothorax. Anterior cervical fusion noted. IMPRESSION: 1. No clear acute cardiopulmonary findings. 2. Central venous congestion in basilar bronchitic markings. Electronically Signed   By: Suzy Bouchard M.D.   On: 02/01/2020 12:25   CT Head Wo Contrast  Result Date: 02/01/2020 CLINICAL DATA:  Fall, facial trauma, headache EXAM:  CT HEAD WITHOUT CONTRAST CT MAXILLOFACIAL WITHOUT CONTRAST TECHNIQUE: Multidetector CT imaging of the head and maxillofacial structures were performed using the standard protocol without intravenous contrast. Multiplanar CT image reconstructions of the maxillofacial structures were also generated. COMPARISON:  07/06/2018  FINDINGS: CT HEAD FINDINGS Brain: No evidence of acute infarction, hemorrhage, hydrocephalus, extra-axial collection or mass lesion/mass effect. Vascular: No hyperdense vessel or unexpected calcification. Skull: Normal. Negative for fracture or focal lesion. Other: No scalp hematoma. CT MAXILLOFACIAL FINDINGS Osseous: No fracture or mandibular dislocation. Abdominal walls intact. No destructive process. Orbits: Negative. No traumatic or inflammatory finding. Sinuses: Bilateral median antrectomies. Paranasal sinuses are well aerated without air-fluid level or mucosal thickening. Mastoid air cells are clear. Soft tissues: No focal soft tissue swelling or hematoma. IMPRESSION: 1. No acute intracranial abnormality. 2. No acute maxillofacial bone fracture. Electronically Signed   By: Davina Poke D.O.   On: 02/01/2020 12:25   CT Angio Chest PE W/Cm &/Or Wo Cm  Result Date: 02/01/2020 CLINICAL DATA:  Shortness of breath and syncope EXAM: CT ANGIOGRAPHY CHEST WITH CONTRAST TECHNIQUE: Multidetector CT imaging of the chest was performed using the standard protocol during bolus administration of intravenous contrast. Multiplanar CT image reconstructions and MIPs were obtained to evaluate the vascular anatomy. CONTRAST:  14mL OMNIPAQUE IOHEXOL 350 MG/ML SOLN COMPARISON:  CT angiogram chest October 15, 2019; chest radiograph February 01, 2020 FINDINGS: Cardiovascular: There is no demonstrable pulmonary embolus. There is no appreciable thoracic aortic aneurysm or dissection. Visualized great vessels appear unremarkable. No pericardial effusion or pericardial thickening evident. Mediastinum/Nodes: Thyroid appears unremarkable. There is no appreciable thoracic adenopathy. There is a sizable hiatal type hernia, also present previously. Lungs/Pleura: Underlying centrilobular emphysematous change is again noted. There is atelectatic change and scarring in the lung bases. There is no airspace consolidation. Relative mosaic  attenuation in the lungs may be due to redistribution of blood flow to viable lung segments. No pleural effusions are evident. Upper Abdomen: Visualized upper abdominal structures appear unremarkable. Musculoskeletal: Postoperative changes noted in the lower cervical region. There are foci of degenerative change in the thoracic spine. There are no blastic or lytic bone lesions. There are no evident chest wall lesions. Review of the MIP images confirms the above findings. IMPRESSION: 1. No demonstrable pulmonary embolus. No thoracic aortic aneurysm or dissection. 2. Underlying emphysematous change. Scarring and atelectatic change in lung bases. No edema or airspace consolidation. Mosaic attenuation in the lungs may in part be due to redistribution of blood flow to viable lung segments. 3.  Sizable hiatal hernia again noted. 4.  No appreciable adenopathy. Emphysema (ICD10-J43.9). Electronically Signed   By: Lowella Grip III M.D.   On: 02/01/2020 12:27   DG Chest Port 1 View  Result Date: 01/04/2020 CLINICAL DATA:  Patient with shortness of breath and chest pain. EXAM: PORTABLE CHEST 1 VIEW COMPARISON:  Chest radiograph 12/14/2019 FINDINGS: Monitoring leads overlie the patient. Stable enlarged cardiac and mediastinal contours. Bibasilar heterogeneous opacities. Apical emphysematous change. Thoracic spine degenerative changes. No pleural effusion or pneumothorax. IMPRESSION: Basilar opacities favored to represent atelectasis. Infection not excluded. Emphysema. Electronically Signed   By: Lovey Newcomer M.D.   On: 01/04/2020 16:18   CT Maxillofacial Wo Contrast  Result Date: 02/01/2020 CLINICAL DATA:  Fall, facial trauma, headache EXAM: CT HEAD WITHOUT CONTRAST CT MAXILLOFACIAL WITHOUT CONTRAST TECHNIQUE: Multidetector CT imaging of the head and maxillofacial structures were performed using the standard protocol without intravenous contrast. Multiplanar CT image reconstructions of the maxillofacial structures  were also  generated. COMPARISON:  07/06/2018 FINDINGS: CT HEAD FINDINGS Brain: No evidence of acute infarction, hemorrhage, hydrocephalus, extra-axial collection or mass lesion/mass effect. Vascular: No hyperdense vessel or unexpected calcification. Skull: Normal. Negative for fracture or focal lesion. Other: No scalp hematoma. CT MAXILLOFACIAL FINDINGS Osseous: No fracture or mandibular dislocation. Abdominal walls intact. No destructive process. Orbits: Negative. No traumatic or inflammatory finding. Sinuses: Bilateral median antrectomies. Paranasal sinuses are well aerated without air-fluid level or mucosal thickening. Mastoid air cells are clear. Soft tissues: No focal soft tissue swelling or hematoma. IMPRESSION: 1. No acute intracranial abnormality. 2. No acute maxillofacial bone fracture. Electronically Signed   By: Davina Poke D.O.   On: 02/01/2020 12:25     Microbiology: Recent Results (from the past 240 hour(s))  Respiratory Panel by RT PCR (Flu A&B, Covid) - Nasopharyngeal Swab     Status: None   Collection Time: 02/01/20  3:48 PM   Specimen: Nasopharyngeal Swab  Result Value Ref Range Status   SARS Coronavirus 2 by RT PCR NEGATIVE NEGATIVE Final    Comment: (NOTE) SARS-CoV-2 target nucleic acids are NOT DETECTED. The SARS-CoV-2 RNA is generally detectable in upper respiratoy specimens during the acute phase of infection. The lowest concentration of SARS-CoV-2 viral copies this assay can detect is 131 copies/mL. A negative result does not preclude SARS-Cov-2 infection and should not be used as the sole basis for treatment or other patient management decisions. A negative result may occur with  improper specimen collection/handling, submission of specimen other than nasopharyngeal swab, presence of viral mutation(s) within the areas targeted by this assay, and inadequate number of viral copies (<131 copies/mL). A negative result must be combined with clinical observations,  patient history, and epidemiological information. The expected result is Negative. Fact Sheet for Patients:  PinkCheek.be Fact Sheet for Healthcare Providers:  GravelBags.it This test is not yet ap proved or cleared by the [place] FDA and  has been authorized for detection and/or diagnosis of SARS-CoV-2 by FDA under an Emergency Use Authorization (EUA). This EUA will remain  in effect (meaning this test can be used) for the duration of the COVID-19 declaration under Section 564(b)(1) of the Act, 21 U.S.C. section 360bbb-3(b)(1), unless the authorization is terminated or revoked sooner.    Influenza A by PCR NEGATIVE NEGATIVE Final   Influenza B by PCR NEGATIVE NEGATIVE Final    Comment: (NOTE) The Xpert Xpress SARS-CoV-2/FLU/RSV assay is intended as an aid in  the diagnosis of influenza from Nasopharyngeal swab specimens and  should not be used as a sole basis for treatment. Nasal washings and  aspirates are unacceptable for Xpert Xpress SARS-CoV-2/FLU/RSV  testing. Fact Sheet for Patients: PinkCheek.be Fact Sheet for Healthcare Providers: GravelBags.it This test is not yet approved or cleared by the [place] FDA and  has been authorized for detection and/or diagnosis of SARS-CoV-2 by  FDA under an Emergency Use Authorization (EUA). This EUA will remain  in effect (meaning this test can be used) for the duration of the  Covid-19 declaration under Section 564(b)(1) of the Act, 21  U.S.C. section 360bbb-3(b)(1), unless the authorization is  terminated or revoked. Performed at [facility], [address] [place], [place] [address]      Labs: Basic Metabolic Panel: Recent Labs  Lab 02/01/20 1104 02/01/20 1257 02/02/20 0616  NA 136  --  140  K 3.5  --  3.5  CL 101  --  105  CO2 24  --  26  GLUCOSE 126*  --  101*  BUN 16  --  11  CREATININE 0.66  --   0.52  CALCIUM 9.1  --  8.9  MG  --  2.1 2.2   Liver Function Tests: Recent Labs  Lab 02/01/20 1104  AST 26  ALT 20  ALKPHOS 87  BILITOT 0.3  PROT 6.9  ALBUMIN 4.0   No results for input(s): LIPASE, AMYLASE in the last 168 hours. No results for input(s): AMMONIA in the last 168 hours. CBC: Recent Labs  Lab 02/01/20 1104 02/02/20 0616  WBC 11.9* 10.2  NEUTROABS 9.2* 8.0*  HGB 9.1* 8.7*  HCT 32.2* 31.4*  MCV 78.3* 78.5*  PLT 409* 366   Cardiac Enzymes: No results for input(s): CKTOTAL, CKMB, CKMBINDEX, TROPONINI in the last 168 hours. BNP: Invalid input(s): POCBNP CBG: No results for input(s): GLUCAP in the last 168 hours.  Time coordinating discharge:  36 minutes  Signed:  Orson Eva, DO [facility] Pager: [phone] 02/02/2020, 7:03 PM

## 2020-02-02 NOTE — Progress Notes (Signed)
Responded to nursing call:  Patient wants to leave AMA   Subjective: She is a &O x 3.  Mental status much improved from this am. In speaking with Scot Dock, PAMELLA SPRINKLE has demonstrated the ability to understand her medical condition(s) which include passing out, copd exacerbation.  RITHIKA WYSS has demonstrated the ability to appreciate how treatment for passing out, copd exacerbation. will be beneficial.   KARLYE DIMMOCK has also demonstrated the ability to understand and appreciate how refusal of treatement for passing out, copd exacerbation.  could result in harm, repeat hospitalization, and possibly death.  TYLEISHA SHAH demonstrates the ability to reason through the risks and benefits of the proposed treatment.  Finally, JIMESHA SCHUT is able to clearly communicate his/her choice.  Initially patient states she was willing to stay.  However, one hour later, she changed her mind and decided to leave ama.    Vitals:   02/02/20 0445 02/02/20 0736 02/02/20 0800 02/02/20 1322  BP:   (!) 106/56   Pulse:   97   Resp:   18   Temp:   97.6 F (36.4 C)   TempSrc:   Oral   SpO2:  99% 93% 95%  Weight:      Height: 5\' 5"  (1.651 m)      CV--RRR Lung--bilateral rhonchi and wheeze         Orson Eva, DO [facility]

## 2020-02-02 NOTE — Progress Notes (Signed)
Pt lethargic, alert to verbal stimuli. Yale swallow test started and d/t pt being lethargic test was failed. Hospitalist notified.

## 2020-02-02 NOTE — Progress Notes (Signed)
PROGRESS NOTE  [name] [id number] DOB: 06-14-1961 DOA: 02/01/2020 PCP: Neale Burly, MD  Brief History:  59 year old female with a history of COPD, tobacco abuse, anxiety/depression, chronic back pain, PTSD, GERD presenting with altered mental status and mechanical fall.  The patient was in the parking lot outside of her apartment.  She was walking to get to her friend who was waiting in the car when she fell hitting her chin on the pavement.  The patient is unable to tell me whether she truly lost consciousness.  Nevertheless, she did not bite her tongue or have bowel or bladder incontinence.  The patient has been complaining of some worsening shortness of breath over the last few days prior to admission.  She continues to smoke 1 pack/day.  She has an approximately 40-pack-year history.  The patient states that she has had some intermittent nausea, vomiting, and diarrhea, but she was unable to tell me the duration or frequency.  Nevertheless, the patient has not had any emesis or diarrhea since hospitalization.  There is been no fevers, chills, hemoptysis, hematochezia, melena, dysuria.  Overall, the patient is a difficult storing.  She is tangential often deflecting any direct answers. In the emergency department, the patient had low-grade temperature of 9 9.1 F with soft blood pressures in the 90s.  The patient was noted to be lethargic and given Narcan with improvement of her mental status.  BMP, LFTs, and CBC were essentially unremarkable.  The patient was admitted for work-up of her encephalopathy.  COVID was neg  Assessment/Plan: Acute toxic/metabolic encephalopathy -Secondary to dehydration and medication effect (opioids, benzodiazepines, other hypnotic medications) -UA negative for pyuria -CT brain negative -EEG -MRI brain -The patient remains confused a.m. 02/02/2020 -Restart reduced dose alprazolam to prevent withdrawal -Minimize hypnotic medications including  mirtazapine, trazodone, gabapentin -123456 -Folic acid -0000000  Near syncope/syncope -Patient had questionable syncopal episode -Check orthostatic vital signs -Remain on telemetry -Personally reviewed EKG--sinus rhythm, no ST or T wave change  COPD exacerbation -Start DuoNeb -Start IV saline -Start Pulmicort -CTA chest--negative PE or dissection.  No consolidation.  Bibasilar scarring. -Personally reviewed chest x-ray--increased interstitial markings.  No consolidation.  Mechanical fall/gait instability -MRI brain -PT evaluation  Chronic Pain Syndrome -PMP AWARE queried -pt receives monthly alprazolam 1mg  #60 and Norco 10/325 #120 from 2 different providers  Depression/anxiety -Restart Cymbalta -holding mirtazapine and trazodone  GERD -Restart Protonix      Disposition Plan: Patient From: Home D/C Place: Home - 2-3  Days Barriers: Not Clinically Stable--remains confused, dyspneic  Family Communication:   Significant other updated 4/27  Consultants:  none  Code Status:  FULL   DVT Prophylaxis:  [place] Lovenox   Procedures: As Listed in Progress Note Above  Antibiotics: None       Subjective: Patient complains of some shortness of breath.  She has some nausea without emesis or diarrhea.  She denies any fevers, chills, chest pain, abdominal pain.  She has nonproductive cough.  There is no hemoptysis or hematochezia.  Objective: Vitals:   02/02/20 0439 02/02/20 0445 02/02/20 0736 02/02/20 0800  BP: 106/61     Pulse: 81   (P) 97  Resp: 20   (P) 18  Temp: 98.7 F (37.1 C)   (P) 97.6 F (36.4 C)  TempSrc: Oral   (P) Oral  SpO2: 100%  99% (P) 93%  Weight: 76.5 kg     Height:  5\' 5"  (1.651 m)  Intake/Output Summary (Last 24 hours) at 02/02/2020 0833 Last data filed at 02/02/2020 0651 Gross per 24 hour  Intake 190.31 ml  Output 501 ml  Net -310.69 ml   Weight change:  Exam:   General:  Pt is alert, follows commands appropriately, not in  acute distress  HEENT: No icterus, No thrush, No neck mass, [place]/AT  Cardiovascular: RRR, S1/S2, no rubs, no gallops  Respiratory: Bilateral rhonchi.  Bilateral expiratory wheeze.  Abdomen: Soft/+BS, non tender, non distended, no guarding  Extremities: No edema, No lymphangitis, No petechiae, No rashes, no synovitis   Data Reviewed: I have personally reviewed following labs and imaging studies Basic Metabolic Panel: Recent Labs  Lab 02/01/20 1104 02/01/20 1257 02/02/20 0616  NA 136  --  140  K 3.5  --  3.5  CL 101  --  105  CO2 24  --  26  GLUCOSE 126*  --  101*  BUN 16  --  11  CREATININE 0.66  --  0.52  CALCIUM 9.1  --  8.9  MG  --  2.1 2.2   Liver Function Tests: Recent Labs  Lab 02/01/20 1104  AST 26  ALT 20  ALKPHOS 87  BILITOT 0.3  PROT 6.9  ALBUMIN 4.0   No results for input(s): LIPASE, AMYLASE in the last 168 hours. No results for input(s): AMMONIA in the last 168 hours. Coagulation Profile: No results for input(s): INR, PROTIME in the last 168 hours. CBC: Recent Labs  Lab 02/01/20 1104 02/02/20 0616  WBC 11.9* 10.2  NEUTROABS 9.2* 8.0*  HGB 9.1* 8.7*  HCT 32.2* 31.4*  MCV 78.3* 78.5*  PLT 409* 366   Cardiac Enzymes: No results for input(s): CKTOTAL, CKMB, CKMBINDEX, TROPONINI in the last 168 hours. BNP: Invalid input(s): POCBNP CBG: No results for input(s): GLUCAP in the last 168 hours. HbA1C: No results for input(s): HGBA1C in the last 72 hours. Urine analysis:    Component Value Date/Time   COLORURINE STRAW (A) 02/01/2020 1337   APPEARANCEUR CLEAR 02/01/2020 1337   LABSPEC 1.025 02/01/2020 1337   PHURINE 5.0 02/01/2020 1337   GLUCOSEU NEGATIVE 02/01/2020 1337   GLUCOSEU NEG mg/dL 10/21/2009 1550   HGBUR NEGATIVE 02/01/2020 1337   BILIRUBINUR NEGATIVE 02/01/2020 1337   KETONESUR NEGATIVE 02/01/2020 1337   PROTEINUR NEGATIVE 02/01/2020 1337   UROBILINOGEN 0.2 12/07/2014 1811   NITRITE NEGATIVE 02/01/2020 1337   LEUKOCYTESUR  NEGATIVE 02/01/2020 1337   Sepsis Labs: @LABRCNTIP (procalcitonin:4,lacticidven:4) ) Recent Results (from the past 240 hour(s))  Respiratory Panel by RT PCR (Flu A&B, Covid) - Nasopharyngeal Swab     Status: None   Collection Time: 02/01/20  3:48 PM   Specimen: Nasopharyngeal Swab  Result Value Ref Range Status   SARS Coronavirus 2 by RT PCR NEGATIVE NEGATIVE Final    Comment: (NOTE) SARS-CoV-2 target nucleic acids are NOT DETECTED. The SARS-CoV-2 RNA is generally detectable in upper respiratoy specimens during the acute phase of infection. The lowest concentration of SARS-CoV-2 viral copies this assay can detect is 131 copies/mL. A negative result does not preclude SARS-Cov-2 infection and should not be used as the sole basis for treatment or other patient management decisions. A negative result may occur with  improper specimen collection/handling, submission of specimen other than nasopharyngeal swab, presence of viral mutation(s) within the areas targeted by this assay, and inadequate number of viral copies (<131 copies/mL). A negative result must be combined with clinical observations, patient history, and epidemiological information. The expected result is Negative. Fact  Sheet for Patients:  PinkCheek.be Fact Sheet for Healthcare Providers:  GravelBags.it This test is not yet ap proved or cleared by the [place] FDA and  has been authorized for detection and/or diagnosis of SARS-CoV-2 by FDA under an Emergency Use Authorization (EUA). This EUA will remain  in effect (meaning this test can be used) for the duration of the COVID-19 declaration under Section 564(b)(1) of the Act, 21 U.S.C. section 360bbb-3(b)(1), unless the authorization is terminated or revoked sooner.    Influenza A by PCR NEGATIVE NEGATIVE Final   Influenza B by PCR NEGATIVE NEGATIVE Final    Comment: (NOTE) The Xpert Xpress SARS-CoV-2/FLU/RSV  assay is intended as an aid in  the diagnosis of influenza from Nasopharyngeal swab specimens and  should not be used as a sole basis for treatment. Nasal washings and  aspirates are unacceptable for Xpert Xpress SARS-CoV-2/FLU/RSV  testing. Fact Sheet for Patients: PinkCheek.be Fact Sheet for Healthcare Providers: GravelBags.it This test is not yet approved or cleared by the [place] FDA and  has been authorized for detection and/or diagnosis of SARS-CoV-2 by  FDA under an Emergency Use Authorization (EUA). This EUA will remain  in effect (meaning this test can be used) for the duration of the  Covid-19 declaration under Section 564(b)(1) of the Act, 21  U.S.C. section 360bbb-3(b)(1), unless the authorization is  terminated or revoked. Performed at [facility], [address] [place], [place] [address]      Scheduled Meds: . ipratropium-albuterol  3 mL Nebulization QID  . sodium chloride flush  3 mL Intravenous Q12H   Continuous Infusions:  Procedures/Studies: DG Chest 1 View  Result Date: 02/01/2020 CLINICAL DATA:  Confusion, fall EXAM: CHEST  1 VIEW COMPARISON:  Radiograph 01/04/2020 FINDINGS: Normal mediastinum and cardiac silhouette. Mild central venous pulmonary congestion. Mild bronchitic markings noted at the lung bases. No effusion, infiltrate pneumothorax. Anterior cervical fusion noted. IMPRESSION: 1. No clear acute cardiopulmonary findings. 2. Central venous congestion in basilar bronchitic markings. Electronically Signed   By: Suzy Bouchard M.D.   On: 02/01/2020 12:25   CT Head Wo Contrast  Result Date: 02/01/2020 CLINICAL DATA:  Fall, facial trauma, headache EXAM: CT HEAD WITHOUT CONTRAST CT MAXILLOFACIAL WITHOUT CONTRAST TECHNIQUE: Multidetector CT imaging of the head and maxillofacial structures were performed using the standard protocol without intravenous contrast. Multiplanar CT image reconstructions  of the maxillofacial structures were also generated. COMPARISON:  07/06/2018 FINDINGS: CT HEAD FINDINGS Brain: No evidence of acute infarction, hemorrhage, hydrocephalus, extra-axial collection or mass lesion/mass effect. Vascular: No hyperdense vessel or unexpected calcification. Skull: Normal. Negative for fracture or focal lesion. Other: No scalp hematoma. CT MAXILLOFACIAL FINDINGS Osseous: No fracture or mandibular dislocation. Abdominal walls intact. No destructive process. Orbits: Negative. No traumatic or inflammatory finding. Sinuses: Bilateral median antrectomies. Paranasal sinuses are well aerated without air-fluid level or mucosal thickening. Mastoid air cells are clear. Soft tissues: No focal soft tissue swelling or hematoma. IMPRESSION: 1. No acute intracranial abnormality. 2. No acute maxillofacial bone fracture. Electronically Signed   By: Davina Poke D.O.   On: 02/01/2020 12:25   CT Angio Chest PE W/Cm &/Or Wo Cm  Result Date: 02/01/2020 CLINICAL DATA:  Shortness of breath and syncope EXAM: CT ANGIOGRAPHY CHEST WITH CONTRAST TECHNIQUE: Multidetector CT imaging of the chest was performed using the standard protocol during bolus administration of intravenous contrast. Multiplanar CT image reconstructions and MIPs were obtained to evaluate the vascular anatomy. CONTRAST:  131mL OMNIPAQUE IOHEXOL 350 MG/ML SOLN COMPARISON:  CT angiogram chest October 15, 2019; chest radiograph February 01, 2020 FINDINGS: Cardiovascular: There is no demonstrable pulmonary embolus. There is no appreciable thoracic aortic aneurysm or dissection. Visualized great vessels appear unremarkable. No pericardial effusion or pericardial thickening evident. Mediastinum/Nodes: Thyroid appears unremarkable. There is no appreciable thoracic adenopathy. There is a sizable hiatal type hernia, also present previously. Lungs/Pleura: Underlying centrilobular emphysematous change is again noted. There is atelectatic change and scarring  in the lung bases. There is no airspace consolidation. Relative mosaic attenuation in the lungs may be due to redistribution of blood flow to viable lung segments. No pleural effusions are evident. Upper Abdomen: Visualized upper abdominal structures appear unremarkable. Musculoskeletal: Postoperative changes noted in the lower cervical region. There are foci of degenerative change in the thoracic spine. There are no blastic or lytic bone lesions. There are no evident chest wall lesions. Review of the MIP images confirms the above findings. IMPRESSION: 1. No demonstrable pulmonary embolus. No thoracic aortic aneurysm or dissection. 2. Underlying emphysematous change. Scarring and atelectatic change in lung bases. No edema or airspace consolidation. Mosaic attenuation in the lungs may in part be due to redistribution of blood flow to viable lung segments. 3.  Sizable hiatal hernia again noted. 4.  No appreciable adenopathy. Emphysema (ICD10-J43.9). Electronically Signed   By: Lowella Grip III M.D.   On: 02/01/2020 12:27   DG Chest Port 1 View  Result Date: 01/04/2020 CLINICAL DATA:  Patient with shortness of breath and chest pain. EXAM: PORTABLE CHEST 1 VIEW COMPARISON:  Chest radiograph 12/14/2019 FINDINGS: Monitoring leads overlie the patient. Stable enlarged cardiac and mediastinal contours. Bibasilar heterogeneous opacities. Apical emphysematous change. Thoracic spine degenerative changes. No pleural effusion or pneumothorax. IMPRESSION: Basilar opacities favored to represent atelectasis. Infection not excluded. Emphysema. Electronically Signed   By: Lovey Newcomer M.D.   On: 01/04/2020 16:18   CT Maxillofacial Wo Contrast  Result Date: 02/01/2020 CLINICAL DATA:  Fall, facial trauma, headache EXAM: CT HEAD WITHOUT CONTRAST CT MAXILLOFACIAL WITHOUT CONTRAST TECHNIQUE: Multidetector CT imaging of the head and maxillofacial structures were performed using the standard protocol without intravenous contrast.  Multiplanar CT image reconstructions of the maxillofacial structures were also generated. COMPARISON:  07/06/2018 FINDINGS: CT HEAD FINDINGS Brain: No evidence of acute infarction, hemorrhage, hydrocephalus, extra-axial collection or mass lesion/mass effect. Vascular: No hyperdense vessel or unexpected calcification. Skull: Normal. Negative for fracture or focal lesion. Other: No scalp hematoma. CT MAXILLOFACIAL FINDINGS Osseous: No fracture or mandibular dislocation. Abdominal walls intact. No destructive process. Orbits: Negative. No traumatic or inflammatory finding. Sinuses: Bilateral median antrectomies. Paranasal sinuses are well aerated without air-fluid level or mucosal thickening. Mastoid air cells are clear. Soft tissues: No focal soft tissue swelling or hematoma. IMPRESSION: 1. No acute intracranial abnormality. 2. No acute maxillofacial bone fracture. Electronically Signed   By: Davina Poke D.O.   On: 02/01/2020 12:25    Orson Eva, DO  [facility]  If 7PM-7AM, please contact night-coverage [email or web address] Password [facility] 02/02/2020, 8:33 AM   LOS: 0 days

## 2021-02-20 ENCOUNTER — Inpatient Hospital Stay (HOSPITAL_COMMUNITY)
Admission: AD | Admit: 2021-02-20 | Discharge: 2021-02-23 | DRG: 378 | Disposition: A | Discharge status: Home / Self Care | Payer: Medicare Other | Source: Other Acute Inpatient Hospital | Attending: Internal Medicine | Admitting: Internal Medicine

## 2021-02-20 ENCOUNTER — Other Ambulatory Visit: Payer: Self-pay

## 2021-02-20 DIAGNOSIS — Z79899 Other long term (current) drug therapy: Secondary | ICD-10-CM | POA: Diagnosis not present

## 2021-02-20 DIAGNOSIS — Z96651 Presence of right artificial knee joint: Secondary | ICD-10-CM | POA: Diagnosis present

## 2021-02-20 DIAGNOSIS — Z20822 Contact with and (suspected) exposure to covid-19: Secondary | ICD-10-CM | POA: Diagnosis present

## 2021-02-20 DIAGNOSIS — F418 Other specified anxiety disorders: Secondary | ICD-10-CM | POA: Diagnosis not present

## 2021-02-20 DIAGNOSIS — F4310 Post-traumatic stress disorder, unspecified: Secondary | ICD-10-CM | POA: Diagnosis present

## 2021-02-20 DIAGNOSIS — K219 Gastro-esophageal reflux disease without esophagitis: Secondary | ICD-10-CM | POA: Diagnosis present

## 2021-02-20 DIAGNOSIS — K2901 Acute gastritis with bleeding: Secondary | ICD-10-CM | POA: Diagnosis present

## 2021-02-20 DIAGNOSIS — D62 Acute posthemorrhagic anemia: Secondary | ICD-10-CM | POA: Diagnosis present

## 2021-02-20 DIAGNOSIS — K449 Diaphragmatic hernia without obstruction or gangrene: Secondary | ICD-10-CM | POA: Diagnosis present

## 2021-02-20 DIAGNOSIS — J449 Chronic obstructive pulmonary disease, unspecified: Secondary | ICD-10-CM | POA: Diagnosis present

## 2021-02-20 DIAGNOSIS — E875 Hyperkalemia: Secondary | ICD-10-CM | POA: Diagnosis present

## 2021-02-20 DIAGNOSIS — Z90711 Acquired absence of uterus with remaining cervical stump: Secondary | ICD-10-CM | POA: Diagnosis not present

## 2021-02-20 DIAGNOSIS — E739 Lactose intolerance, unspecified: Secondary | ICD-10-CM | POA: Diagnosis present

## 2021-02-20 DIAGNOSIS — G894 Chronic pain syndrome: Secondary | ICD-10-CM | POA: Diagnosis present

## 2021-02-20 DIAGNOSIS — Z880 Allergy status to penicillin: Secondary | ICD-10-CM | POA: Diagnosis not present

## 2021-02-20 DIAGNOSIS — F17210 Nicotine dependence, cigarettes, uncomplicated: Secondary | ICD-10-CM | POA: Diagnosis present

## 2021-02-20 DIAGNOSIS — D509 Iron deficiency anemia, unspecified: Secondary | ICD-10-CM | POA: Diagnosis present

## 2021-02-20 DIAGNOSIS — K922 Gastrointestinal hemorrhage, unspecified: Secondary | ICD-10-CM | POA: Diagnosis not present

## 2021-02-20 DIAGNOSIS — K920 Hematemesis: Secondary | ICD-10-CM | POA: Diagnosis present

## 2021-02-20 DIAGNOSIS — F32A Depression, unspecified: Secondary | ICD-10-CM | POA: Diagnosis present

## 2021-02-20 DIAGNOSIS — J439 Emphysema, unspecified: Secondary | ICD-10-CM | POA: Diagnosis not present

## 2021-02-20 LAB — CBC
HCT: 23.6 % — ABNORMAL LOW (ref 36.0–46.0)
Hemoglobin: 6.2 g/dL — CL (ref 12.0–15.0)
MCH: 19.1 pg — ABNORMAL LOW (ref 26.0–34.0)
MCHC: 26.3 g/dL — ABNORMAL LOW (ref 30.0–36.0)
MCV: 72.8 fL — ABNORMAL LOW (ref 80.0–100.0)
Platelets: 125 10*3/uL — ABNORMAL LOW (ref 150–400)
RBC: 3.24 MIL/uL — ABNORMAL LOW (ref 3.87–5.11)
RDW: 20 % — ABNORMAL HIGH (ref 11.5–15.5)
WBC: 6.1 10*3/uL (ref 4.0–10.5)
nRBC: 0 % (ref 0.0–0.2)

## 2021-02-20 MED ORDER — SODIUM CHLORIDE 0.9% FLUSH
3.0000 mL | Freq: Two times a day (BID) | INTRAVENOUS | Status: DC
  Administered 2021-02-21 – 2021-02-22 (×3): 3 mL via INTRAVENOUS

## 2021-02-20 MED ORDER — ACETAMINOPHEN 325 MG PO TABS
650.0000 mg | ORAL_TABLET | Freq: Four times a day (QID) | ORAL | Status: DC | PRN
  Administered 2021-02-20: 650 mg via ORAL
  Filled 2021-02-20: qty 2

## 2021-02-20 MED ORDER — ONDANSETRON HCL 4 MG/2ML IJ SOLN
4.0000 mg | Freq: Four times a day (QID) | INTRAMUSCULAR | Status: DC | PRN
  Administered 2021-02-21: 4 mg via INTRAVENOUS
  Filled 2021-02-20: qty 2

## 2021-02-20 MED ORDER — LACTATED RINGERS IV SOLN: INTRAVENOUS | Status: AC

## 2021-02-20 MED ORDER — ONDANSETRON HCL 4 MG PO TABS: 4.0000 mg | ORAL_TABLET | Freq: Four times a day (QID) | ORAL | Status: DC | PRN

## 2021-02-20 MED ORDER — PANTOPRAZOLE SODIUM 40 MG IV SOLR
40.0000 mg | Freq: Two times a day (BID) | INTRAVENOUS | Status: DC
  Administered 2021-02-21 – 2021-02-22 (×5): 40 mg via INTRAVENOUS
  Filled 2021-02-20 (×6): qty 40

## 2021-02-20 MED ORDER — SODIUM CHLORIDE 0.9% IV SOLUTION: Freq: Once | INTRAVENOUS | Status: DC

## 2021-02-20 MED ORDER — ACETAMINOPHEN 650 MG RE SUPP: 650.0000 mg | Freq: Four times a day (QID) | RECTAL | Status: DC | PRN

## 2021-02-20 NOTE — H&P (Signed)
History and Physical    [name] CWC:[id number] DOB: 09/12/1961 DOA: 02/20/2021  PCP: Neale Burly, MD  Patient coming from: [facility] ED  I have personally briefly reviewed patient's old medical records in [place]  Chief Complaint: Dark black emesis  HPI: [name] is a 60 y.o. female with medical history significant for COPD, iron deficiency anemia, gastritis, depression who presented to [facility] ED for evaluation of dark black emesis.  Patient reports new onset of dark black emesis beginning several days ago.  She has had associated lightheadedness/dizziness without syncope.  She has been having worsening acid reflux/heartburn symptoms with upper abdominal pain.  She did not see any red-colored emesis or hematochezia/melena.  She says she is not taking any blood thinners or using NSAIDs.  She denies any alcohol use.  She does report a history of gastritis.  She initially went to the [facility] ED on 5/14.  Hemoglobin was 7.1.  She was recommended to be admitted but she left AGAINST MEDICAL ADVICE.  She returned to [facility] ED 5/16.  Hemoglobin was 6.9.  Admission was again recommended and this time she was agreeable.  The ED physician at [facility] discussed with on-call GI here who recommended medical admission and will be available for consultation.  In the ED patient was given 500 cc normal saline, IV Zofran and Phenergan, IV Protonix 40 mg then continuous drip.  Blood transfusion was ordered but not given prior to transfer.  Review of care everywhere records show that last upper endoscopy was performed 03/08/2020 which showed gastritis, large hiatal hernia.  Colonoscopy performed the same day was limited due to poor preparation.  Diverticulosis in the ascending and proximal ascending colon were noted.  1 6 mm polyp in the mid ascending colon was incompletely resected.  Review of Systems: All systems reviewed and are negative except as  documented in history of present illness above.   Past Medical History:  Diagnosis Date  . ADHD (attention deficit hyperactivity disorder)   . Anginal pain ([place])   . Asthma   . BMI (body mass index) 20.0-29.9 2009 127 LBS  . Chronic abdominal pain 2003   EX LAP APR 2004 RUPTURE L OV CYST, JUL 2004 ADHESIONS  . Chronic nausea   . Clostridium difficile colitis 04/10/11 &11/2011   tx w/ flagyl  . COPD (chronic obstructive pulmonary disease) ([place])   . Degenerative disc disease   . Depression   . GERD (gastroesophageal reflux disease)   . Hiatal hernia 2008   on EGD above  . Irritable bowel syndrome 2004 CONSTIPATION  . Migraines   . MRSA infection    Dr. [name], currently under treatment  . PTSD (post-traumatic stress disorder)   . PUD (peptic ulcer disease) 01/2007   EGD Dr [name], 2 antral ulcers, negative h pylori  . Rectal prolapse   . S/P colonoscopy 06/02/09   normal ([name])  . S/P endoscopy 03/26/11   gastritis-Dr [name]  . SBO (small bowel obstruction) (HCC)     Past Surgical History:  Procedure Laterality Date  . ABDOMINAL HYSTERECTOMY    . APPENDECTOMY    . BACK SURGERY    . BOWEL RESECTION     x2, secondary to adhesions  . CERVICAL SPINE SURGERY    . COLONOSCOPY  03/21/2012    VOH:YWVPXT ADENOMA(1) POOR PREP  . COLONOSCOPY N/A 11/17/2013   GGY:IRSWNI mucosa in the terminal ileum/NORMAL surgical anastomosis/Small internal hemorrhoids  . ESOPHAGOGASTRODUODENOSCOPY  03/23/2011    Normal esophagus without evidence of Barrett's, mass, erosions, or ulcerations./ Patchy erythema with occasional erosion in the antrum.  Biopsies  obtained via cold forceps to evaluate for H. pylori gastritis/ Small hiatal hernia./Normal duodenal bulb and second portion of the duodenum.  . ESOPHAGOGASTRODUODENOSCOPY  03/21/2012   [id number] Hiatal hernia/ABDOMINALPAIN/DIARRHEA MOST LIKELY DUE TO IBS, GASTRITIS DUODENITIS  . FLEXIBLE SIGMOIDOSCOPY  07/14/2002   Normal limited  flexible sigmoidoscopy with stool in the rectum and rectosigmoid precluding a full colonoscopy  . JOINT REPLACEMENT    . LAPAROSCOPIC LYSIS INTESTINAL ADHESIONS    . NECK SURGERY  2009  . PARTIAL HYSTERECTOMY    . 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  . 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   Procedure: RIGHT TOTAL KNEE ARTHROPLASTY;  Surgeon: Marybelle Killings, MD;  Location: [place];  Service: Orthopedics;  Laterality: Right;  . TUBAL LIGATION    . UMBILICAL HERNIA REPAIR  2008   x5  . UPPER GASTROINTESTINAL ENDOSCOPY  APR 2008 RMR BLEEDING/PAIN   PUD  . UPPER GASTROINTESTINAL ENDOSCOPY  JUL 2012 SLF PAIN   MILD GASTRITIS    Social History:  reports that she has been smoking cigarettes. She has a 5.00 pack-year smoking history. She has never used smokeless tobacco. She reports previous alcohol use. She reports that she does not use drugs.  Allergies  Allergen Reactions  . Penicillins Anaphylaxis and Other (See Comments)    Childhood allergy  Has patient had a PCN reaction causing immediate rash, facial/tongue/throat swelling, SOB or lightheadedness with hypotension: Yes Has patient had a PCN reaction causing severe rash involving mucus membranes or skin necrosis: No Has patient had a PCN reaction that required hospitalization Yes Has patient had a PCN reaction occurring within the last 10 years: No If all of the above answers are "NO", then may proceed with Cephalosporin use.   . Clarithromycin Nausea And Vomiting  . Moxifloxacin Nausea And Vomiting  . Quinolones Nausea And Vomiting  . Salicylates Nausea And Vomiting  . Lactose Intolerance (Gi) Diarrhea  . Lactulose Diarrhea  . Aspirin Other (See Comments)    Burning of stomach. REACTION: GI upset  . Buprenorphine Hcl Itching and Other (See Comments)    Suboxone  . Ibuprofen Nausea Only and Other (See Comments)    Severe heartburn, Upset stomach due to acid reflux  . Morphine And Related Itching  . Sulfa Antibiotics  Itching    Family History  Adopted: Yes     Prior to Admission medications   Medication Sig Start Date End Date Taking? Authorizing Provider  albuterol (PROVENTIL) (2.5 MG/3ML) 0.083% nebulizer solution Take 2.5 mg by nebulization every 6 (six) hours as needed for wheezing or shortness of breath.    [provider]  albuterol (PROVENTIL,VENTOLIN) 90 MCG/ACT inhaler Inhale 2 puffs into the lungs every 6 (six) hours as needed for wheezing or shortness of breath. For shortness of breath    [provider]  DULoxetine (CYMBALTA) 60 MG capsule Take 120 mg by mouth daily.     [provider]  gabapentin (NEURONTIN) 300 MG capsule Take 300-600 mg by mouth See admin instructions. Take 300 mg in the AM and 600 mg at bedtime    [provider]  mirtazapine (REMERON) 15 MG tablet Take 15 mg by mouth at bedtime as needed (for rest).  08/17/13   [provider]  omeprazole (PRILOSEC) 20 MG capsule Take 1 capsule (20 mg total) by mouth 2 (two) times daily  before a meal. 10/14/19   Horton, Barbette Hair, MD  ondansetron (ZOFRAN ODT) 4 MG disintegrating tablet Take 1 tablet (4 mg total) by mouth every 8 (eight) hours as needed for nausea or vomiting. 10/14/19   Horton, Barbette Hair, MD  prazosin (MINIPRESS) 5 MG capsule TAKE ONE CAPSULE BY MOUTH AT BEDTIME FOR PTSD/NIGHTMARES 05/23/17   [provider]  sucralfate (CARAFATE) 1 g tablet Take 1 tablet (1 g total) by mouth 4 (four) times daily -  with meals and at bedtime. 10/14/19   Horton, Barbette Hair, MD  traZODone (DESYREL) 50 MG tablet TAKE ONE TABLET BY MOUTH AT BEDTIME AS NEEDED SLEEP 05/23/17   [provider]    Physical Exam: There were no vitals filed for this visit. Constitutional: Resting supine in bed, NAD, calm, comfortable Eyes: PERRL, lids and conjunctivae normal ENMT: Mucous membranes are moist. Posterior pharynx clear of any exudate or lesions.Normal dentition.  Neck: normal, supple, no  masses. Respiratory: End expiratory wheezing.  Normal respiratory effort. No accessory muscle use.  Cardiovascular: Regular rate and rhythm, no murmurs / rubs / gallops. No extremity edema. 2+ pedal pulses. Abdomen: Mild epigastric and left lower quadrant tenderness, no masses palpated. No hepatosplenomegaly. Bowel sounds positive.  Well-healed surgical scar. Musculoskeletal: no clubbing / cyanosis. No joint deformity upper and lower extremities. Good ROM, no contractures. Normal muscle tone.  Skin: no rashes, lesions, ulcers. No induration Neurologic: CN 2-12 grossly intact. Sensation intact. Strength 5/5 in all 4.  Psychiatric: Normal judgment and insight. Alert and oriented x 3. Normal mood.    Labs on Admission: I have personally reviewed following labs and imaging studies  CBC: Recent Labs  Lab 02/20/21 2310  WBC 6.1  HGB 6.2*  HCT 23.6*  MCV 72.8*  PLT 542*   Basic Metabolic Panel: No results for input(s): NA, K, CL, CO2, GLUCOSE, BUN, CREATININE, CALCIUM, MG, PHOS in the last 168 hours. GFR: CrCl cannot be calculated (Patient's most recent lab result is older than the maximum 21 days allowed.). Liver Function Tests: No results for input(s): AST, ALT, ALKPHOS, BILITOT, PROT, ALBUMIN in the last 168 hours. No results for input(s): LIPASE, AMYLASE in the last 168 hours. No results for input(s): AMMONIA in the last 168 hours. Coagulation Profile: No results for input(s): INR, PROTIME in the last 168 hours. Cardiac Enzymes: No results for input(s): CKTOTAL, CKMB, CKMBINDEX, TROPONINI in the last 168 hours. BNP (last 3 results) No results for input(s): PROBNP in the last 8760 hours. HbA1C: No results for input(s): HGBA1C in the last 72 hours. CBG: No results for input(s): GLUCAP in the last 168 hours. Lipid Profile: No results for input(s): CHOL, HDL, LDLCALC, TRIG, CHOLHDL, LDLDIRECT in the last 72 hours. Thyroid Function Tests: No results for input(s): TSH, T4TOTAL,  FREET4, T3FREE, THYROIDAB in the last 72 hours. Anemia Panel: Recent Labs    02/20/21 2310  VITAMINB12 180  FERRITIN 6*  TIBC 561*  IRON 84   Urine analysis:    Component Value Date/Time   COLORURINE STRAW (A) 02/01/2020 1337   APPEARANCEUR CLEAR 02/01/2020 1337   LABSPEC 1.025 02/01/2020 1337   PHURINE 5.0 02/01/2020 1337   GLUCOSEU NEGATIVE 02/01/2020 1337   GLUCOSEU NEG mg/dL 10/21/2009 1550   HGBUR NEGATIVE 02/01/2020 1337   [place] 02/01/2020 1337   [place] 02/01/2020 1337   PROTEINUR NEGATIVE 02/01/2020 1337   UROBILINOGEN 0.2 12/07/2014 1811   NITRITE NEGATIVE 02/01/2020 1337   LEUKOCYTESUR NEGATIVE 02/01/2020 1337  Radiological Exams on Admission: No results found.  EKG: Not performed.  Assessment/Plan Principal Problem:   Upper GI bleed Active Problems:   Depression with anxiety   COPD (chronic obstructive pulmonary disease) (HCC)   Iron deficiency anemia   [name] is a 60 y.o. female with medical history significant for COPD, iron deficiency anemia, gastritis, depression who is admitted for upper GI bleed  Symptomatic acute blood loss anemia due to suspected upper GI bleed and iron deficiency: History concerning for acute upper GI bleed.  Hemoglobin down to 6.2 on arrival. Ferritin 6, consider IV iron transfusion while in hospital -Transfused 2 unit PRBC -Continue IV Protonix 40 mg twice daily -Continue IV fluid hydration overnight -Keep n.p.o. -Will need GI consultation in a.m.  COPD: Continue albuterol as needed.  States that she is no longer smoking.  Depression/anxiety/PTSD: Continue home Zyprexa, prazosin, Cymbalta, Xanax.  Chronic pain: Continue Norco as needed.  DVT prophylaxis: SCDs Code Status: Full code, confirmed with patient Family Communication: Discussed with patient, she has discussed with family Disposition Plan: From home and likely discharged home pending clinical progress Consults called:  Will need GI consult in a.m. Level of care: Med-Surg Admission status:  Status is: Inpatient  Remains inpatient appropriate because:Ongoing diagnostic testing needed not appropriate for outpatient work up and IV treatments appropriate due to intensity of illness or inability to take PO   Dispo: The patient is from: Home              Anticipated d/c is to: Home              Patient currently is not medically stable to d/c.   Difficult to place patient No   Zada Finders MD [facility]  If 7PM-7AM, please contact night-coverage [email or web address]  02/21/2021, 12:47 AM

## 2021-02-20 NOTE — Consult Note (Incomplete)
[facility] Consult: 4:15 PM 02/20/2021  LOS: 1 day    Referring Provider: ED in [place].   Primary Care Physician:  Neale Burly, MD Primary Gastroenterologist: Dr. [name] in [place].  Dr. Ladona Horns in [place]    Reason for Consultation: GI bleed.   HPI: [name] is a 60 y.o. female.  PMH COPD.  Chronic tobacco.  Anxiety.  PTSD.  Low back pain.  Spinal fusion.  S/p sigmoid resection.  Multiple orthopedic surgeries.  Laparoscopic lysis of adhesions with bowel resections on 2 occasions..  Appendectomy.  Abdominal hysterectomy.  Umbilical hernia repairs "x 5"  2013 colonoscopy for evaluation of altered bowel habits abdominal pain.  With sessile polyps removed from transverse colon and rectum.  Nonbleeding internal hemorrhoids.  Incomplete study with presence of formed stool and liquid stool in the colon which could not be aspirated. 03/2012 EGD.  For abdominal pain and diarrhea.  Small hiatal hernia.  Suspect symptoms due to IBS.  Biopsies obtained 11/2013 colonoscopy.  Personal history of single adenoma in 2013 but poor prep.  Also having diarrhea alternating constipation.  Normal mucosa to TI.  Normal surgical anastomosis.  Small internal hemorrhoids. 03/2020 EGD for abdominal pain, diarrhea, Hgb 7 requiring 2 PO RBCs. Large hiatal hernia.  Antral erythema without active bleeding.  Normal examined duodenum.  CLOtest 03/2020 colonoscopy.  Poor prep.  Diverticulosis.  6 mm polyp removed from mid ascending colon.  Polyp resection described as "incomplete", resected tissue not retrieved.  Previous meds include Movantik, iron sulfate twice daily, pantoprazole 40 mg daily    Past Medical History:  Diagnosis Date  . ADHD (attention deficit hyperactivity disorder)   . Anginal pain ([place])   . Asthma   . BMI (body  mass index) 20.0-29.9 2009 127 LBS  . Chronic abdominal pain 2003   EX LAP APR 2004 RUPTURE L OV CYST, JUL 2004 ADHESIONS  . Chronic nausea   . Clostridium difficile colitis 04/10/11 &11/2011   tx w/ flagyl  . COPD (chronic obstructive pulmonary disease) ([place])   . Degenerative disc disease   . Depression   . GERD (gastroesophageal reflux disease)   . Hiatal hernia 2008   on EGD above  . Irritable bowel syndrome 2004 CONSTIPATION  . Migraines   . MRSA infection    Dr. [name], currently under treatment  . PTSD (post-traumatic stress disorder)   . PUD (peptic ulcer disease) 01/2007   EGD Dr [name], 2 antral ulcers, negative h pylori  . Rectal prolapse   . S/P colonoscopy 06/02/09   normal ([name])  . S/P endoscopy 03/26/11   gastritis-Dr [name]  . SBO (small bowel obstruction) (HCC)     Past Surgical History:  Procedure Laterality Date  . ABDOMINAL HYSTERECTOMY    . APPENDECTOMY    . BACK SURGERY    . BOWEL RESECTION     x2, secondary to adhesions  . CERVICAL SPINE SURGERY    . COLONOSCOPY  03/21/2012    HER:DEYCXK ADENOMA(1) POOR PREP  . COLONOSCOPY N/A 11/17/2013   GYJ:EHUDJS mucosa  in the terminal ileum/NORMAL surgical anastomosis/Small internal hemorrhoids  . ESOPHAGOGASTRODUODENOSCOPY  03/23/2011    Normal esophagus without evidence of Barrett's, mass, erosions, or ulcerations./ Patchy erythema with occasional erosion in the antrum.  Biopsies  obtained via cold forceps to evaluate for H. pylori gastritis/ Small hiatal hernia./Normal duodenal bulb and second portion of the duodenum.  . ESOPHAGOGASTRODUODENOSCOPY  03/21/2012   OXB:DZHGD Hiatal hernia/ABDOMINALPAIN/DIARRHEA MOST LIKELY DUE TO IBS, GASTRITIS DUODENITIS  . FLEXIBLE SIGMOIDOSCOPY  07/14/2002   Normal limited flexible sigmoidoscopy with stool in the rectum and rectosigmoid precluding a full colonoscopy  . JOINT REPLACEMENT    . LAPAROSCOPIC LYSIS INTESTINAL ADHESIONS    . NECK SURGERY  2009  .  PARTIAL HYSTERECTOMY    . 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  . 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   Procedure: RIGHT TOTAL KNEE ARTHROPLASTY;  Surgeon: Marybelle Killings, MD;  Location: [place];  Service: Orthopedics;  Laterality: Right;  . TUBAL LIGATION    . UMBILICAL HERNIA REPAIR  2008   x5  . UPPER GASTROINTESTINAL ENDOSCOPY  APR 2008 RMR BLEEDING/PAIN   PUD  . UPPER GASTROINTESTINAL ENDOSCOPY  JUL 2012 SLF PAIN   MILD GASTRITIS    Prior to Admission medications   Medication Sig Start Date End Date Taking? Authorizing Provider  albuterol (PROVENTIL) (2.5 MG/3ML) 0.083% nebulizer solution Take 2.5 mg by nebulization every 6 (six) hours as needed for wheezing or shortness of breath.    [provider]  albuterol (PROVENTIL,VENTOLIN) 90 MCG/ACT inhaler Inhale 2 puffs into the lungs every 6 (six) hours as needed for wheezing or shortness of breath. For shortness of breath    [provider]  DULoxetine (CYMBALTA) 60 MG capsule Take 120 mg by mouth daily.     [provider]  gabapentin (NEURONTIN) 300 MG capsule Take 300-600 mg by mouth See admin instructions. Take 300 mg in the AM and 600 mg at bedtime    [provider]  mirtazapine (REMERON) 15 MG tablet Take 15 mg by mouth at bedtime as needed (for rest).  08/17/13   [provider]  omeprazole (PRILOSEC) 20 MG capsule Take 1 capsule (20 mg total) by mouth 2 (two) times daily before a meal. 10/14/19   Horton, Barbette Hair, MD  ondansetron (ZOFRAN ODT) 4 MG disintegrating tablet Take 1 tablet (4 mg total) by mouth every 8 (eight) hours as needed for nausea or vomiting. 10/14/19   Horton, Barbette Hair, MD  prazosin (MINIPRESS) 5 MG capsule TAKE ONE CAPSULE BY MOUTH AT BEDTIME FOR PTSD/NIGHTMARES 05/23/17   [provider]  sucralfate (CARAFATE) 1 g tablet Take 1 tablet (1 g total) by mouth 4 (four) times daily -  with meals and at bedtime. 10/14/19   Horton, Barbette Hair, MD  traZODone  (DESYREL) 50 MG tablet TAKE ONE TABLET BY MOUTH AT BEDTIME AS NEEDED SLEEP 05/23/17   [provider]    Scheduled Meds:  Infusions:  PRN Meds:    Allergies as of 02/01/2020 - Review Complete 02/01/2020  Allergen Reaction Noted  . Penicillins Anaphylaxis and Other (See Comments)   . Clarithromycin Nausea And Vomiting 03/14/2016  . Moxifloxacin Nausea And Vomiting 03/14/2016  . Quinolones Nausea And Vomiting 03/14/2016  . Salicylates Nausea And Vomiting 03/14/2016  . Lactose intolerance (gi) Diarrhea 04/18/2014  . Lactulose Diarrhea 04/18/2014  . Aspirin Other (See Comments)   . Buprenorphine hcl Itching and Other (See Comments) 01/05/2015  . Ibuprofen Nausea Only and Other (See Comments) 10/08/2011  .  Morphine and related Itching 07/15/2011  . Sulfa antibiotics Itching 05/24/2011    Family History  Adopted: Yes    Social History   Socioeconomic History  . Marital status: Divorced    Spouse name: Not on file  . Number of children: 1  . Years of education: Not on file  . Highest education level: Not on file  Occupational History  . Occupation: disabled  Tobacco Use  . Smoking status: Current Every Day Smoker    Packs/day: 0.50    Years: 10.00    Pack years: 5.00    Types: Cigarettes  . Smokeless tobacco: Never Used  Vaping Use  . Vaping Use: Former  Substance and Sexual Activity  . Alcohol use: Not Currently    Comment: 05/07/2017 "quit when I was 19; drank for 1 year total"  . Drug use: No  . Sexual activity: Never    Birth control/protection: Surgical  Other Topics Concern  . Not on file  Social History Narrative  . Not on file   Social Determinants of Health   Financial Resource Strain: Not on file  Food Insecurity: Not on file  Transportation Needs: Not on file  Physical Activity: Not on file  Stress: Not on file  Social Connections: Not on file  Intimate Partner Violence: Not on file    REVIEW OF SYSTEMS: Constitutional:  *** ENT:   No nose bleeds Pulm:  *** CV:  No palpitations, no LE edema.  GU:  No hematuria, no frequency GI:  *** Heme:  ***   Transfusions:  *** Neuro:  No headaches, no peripheral tingling or numbness Derm:  No itching, no rash or sores.  Endocrine:  No sweats or chills.  No polyuria or dysuria Immunization:  *** Travel:  None beyond local counties in last few months.    PHYSICAL EXAM: Vital signs in last 24 hours: Vitals:   02/02/20 0800 02/02/20 1322  BP: (!) 106/56   Pulse: 97   Resp: 18   Temp: 97.6 F (36.4 C)   SpO2: 93% 95%   Wt Readings from Last 3 Encounters:  02/02/20 76.5 kg  01/04/20 69.9 kg  12/10/19 69.9 kg    General: *** Head:  ***  Eyes:  *** Ears:  ***  Nose:  *** Mouth:  *** Neck:  *** Lungs:  *** Heart: *** Abdomen:  ***.   Rectal: ***   Musc/Skeltl: *** Extremities:  ***  Neurologic:  *** Skin:  *** Tattoos:  *** Nodes:  ***   Psych:  ***  Intake/Output from previous day: No intake/output data recorded. Intake/Output this shift: No intake/output data recorded.  LAB RESULTS: No results for input(s): WBC, HGB, HCT, PLT in the last 72 hours. BMET Lab Results  Component Value Date   NA 140 02/02/2020   NA 136 02/01/2020   NA 133 (L) 01/04/2020   K 3.5 02/02/2020   K 3.5 02/01/2020   K 3.8 01/04/2020   CL 105 02/02/2020   CL 101 02/01/2020   CL 101 01/04/2020   CO2 26 02/02/2020   CO2 24 02/01/2020   CO2 23 01/04/2020   GLUCOSE 101 (H) 02/02/2020   GLUCOSE 126 (H) 02/01/2020   GLUCOSE 103 (H) 01/04/2020   BUN 11 02/02/2020   BUN 16 02/01/2020   BUN 18 01/04/2020   CREATININE 0.52 02/02/2020   CREATININE 0.66 02/01/2020   CREATININE 0.69 01/04/2020   CALCIUM 8.9 02/02/2020   CALCIUM 9.1 02/01/2020   CALCIUM 8.6 (L) 01/04/2020  LFT No results for input(s): PROT, ALBUMIN, AST, ALT, ALKPHOS, BILITOT, BILIDIR, IBILI in the last 72 hours. PT/INR Lab Results  Component Value Date   INR 0.92 07/30/2016   INR 0.83 11/11/2010    INR 1.0 10/02/2008   Hepatitis Panel No results for input(s): HEPBSAG, HCVAB, HEPAIGM, HEPBIGM in the last 72 hours. C-Diff No components found for: CDIFF Lipase     Component Value Date/Time   LIPASE 17 10/14/2019 1410    Drugs of Abuse     Component Value Date/Time   LABOPIA POSITIVE (A) 02/01/2020 1338   COCAINSCRNUR NONE DETECTED 02/01/2020 1338   LABBENZ POSITIVE (A) 02/01/2020 1338   AMPHETMU NONE DETECTED 02/01/2020 1338   THCU NONE DETECTED 02/01/2020 1338   LABBARB NONE DETECTED 02/01/2020 1338     RADIOLOGY STUDIES: No results found.  ENDOSCOPIC STUDIES: ***  IMPRESSION:   ***    PLAN:     ***   Azucena Freed  02/20/2021, 4:15 PM Phone [phone]

## 2021-02-21 DIAGNOSIS — K922 Gastrointestinal hemorrhage, unspecified: Secondary | ICD-10-CM | POA: Diagnosis not present

## 2021-02-21 DIAGNOSIS — D509 Iron deficiency anemia, unspecified: Secondary | ICD-10-CM | POA: Diagnosis present

## 2021-02-21 LAB — CBC
HCT: 30.1 % — ABNORMAL LOW (ref 36.0–46.0)
Hemoglobin: 8.7 g/dL — ABNORMAL LOW (ref 12.0–15.0)
MCH: 21.8 pg — ABNORMAL LOW (ref 26.0–34.0)
MCHC: 28.9 g/dL — ABNORMAL LOW (ref 30.0–36.0)
MCV: 75.3 fL — ABNORMAL LOW (ref 80.0–100.0)
Platelets: 464 10*3/uL — ABNORMAL HIGH (ref 150–400)
RBC: 4 MIL/uL (ref 3.87–5.11)
RDW: 21.7 % — ABNORMAL HIGH (ref 11.5–15.5)
WBC: 6.9 10*3/uL (ref 4.0–10.5)
nRBC: 0.3 % — ABNORMAL HIGH (ref 0.0–0.2)

## 2021-02-21 LAB — BASIC METABOLIC PANEL
Anion gap: 6 (ref 5–15)
BUN: 12 mg/dL (ref 8–23)
CO2: 27 mmol/L (ref 22–32)
Calcium: 8.8 mg/dL — ABNORMAL LOW (ref 8.9–10.3)
Chloride: 105 mmol/L (ref 98–111)
Creatinine, Ser: 0.52 mg/dL (ref 0.44–1.00)
GFR, Estimated: >60 mL/min (ref >60)
Glucose, Bld: 101 mg/dL — ABNORMAL HIGH (ref 70–99)
Potassium: 4 mmol/L (ref 3.5–5.1)
Sodium: 138 mmol/L (ref 135–145)

## 2021-02-21 LAB — IRON AND TIBC
Iron: 84 ug/dL (ref 28–170)
Saturation Ratios: 15 % (ref 10.4–31.8)
TIBC: 561 ug/dL — ABNORMAL HIGH (ref 250–450)
UIBC: 477 ug/dL

## 2021-02-21 LAB — PREPARE RBC (CROSSMATCH)

## 2021-02-21 LAB — VITAMIN B12: Vitamin B-12: 180 pg/mL (ref 180–914)

## 2021-02-21 LAB — HIV ANTIBODY (ROUTINE TESTING W REFLEX): HIV Screen 4th Generation wRfx: NONREACTIVE

## 2021-02-21 LAB — BASIC METABOLIC PANEL WITH GFR
Anion gap: 7 (ref 5–15)
BUN: 12 mg/dL (ref 8–23)
CO2: 25 mmol/L (ref 22–32)
Calcium: 8.6 mg/dL — ABNORMAL LOW (ref 8.9–10.3)
Chloride: 102 mmol/L (ref 98–111)
Creatinine, Ser: 0.64 mg/dL (ref 0.44–1.00)
GFR, Estimated: >60 mL/min
Glucose, Bld: 99 mg/dL (ref 70–99)
Potassium: 5.9 mmol/L — ABNORMAL HIGH (ref 3.5–5.1)
Sodium: 134 mmol/L — ABNORMAL LOW (ref 135–145)

## 2021-02-21 LAB — FERRITIN: Ferritin: 6 ng/mL — ABNORMAL LOW (ref 11–307)

## 2021-02-21 LAB — FOLATE: Folate: 62.1 ng/mL

## 2021-02-21 MED ORDER — SODIUM CHLORIDE 0.9 % IV SOLN: 12.5000 mg | Freq: Four times a day (QID) | INTRAVENOUS | Status: DC | PRN

## 2021-02-21 MED ORDER — GABAPENTIN 300 MG PO CAPS
600.0000 mg | ORAL_CAPSULE | Freq: Every day | ORAL | Status: DC
  Administered 2021-02-21 – 2021-02-22 (×3): 600 mg via ORAL
  Filled 2021-02-21 (×3): qty 2

## 2021-02-21 MED ORDER — MIRTAZAPINE 15 MG PO TABS: 15.0000 mg | ORAL_TABLET | Freq: Every evening | ORAL | Status: DC | PRN

## 2021-02-21 MED ORDER — HYDROCODONE-ACETAMINOPHEN 10-325 MG PO TABS
1.0000 | ORAL_TABLET | ORAL | Status: DC | PRN
  Administered 2021-02-21 – 2021-02-23 (×9): 1 via ORAL
  Filled 2021-02-21 (×9): qty 1

## 2021-02-21 MED ORDER — PROCHLORPERAZINE EDISYLATE 10 MG/2ML IJ SOLN
10.0000 mg | Freq: Four times a day (QID) | INTRAMUSCULAR | Status: DC | PRN
  Administered 2021-02-21: 10 mg via INTRAVENOUS
  Filled 2021-02-21: qty 2

## 2021-02-21 MED ORDER — GABAPENTIN 300 MG PO CAPS
300.0000 mg | ORAL_CAPSULE | Freq: Every day | ORAL | Status: DC
  Administered 2021-02-21 – 2021-02-23 (×3): 300 mg via ORAL
  Filled 2021-02-21 (×3): qty 1

## 2021-02-21 MED ORDER — PROPRANOLOL HCL 10 MG PO TABS: 10.0000 mg | ORAL_TABLET | Freq: Two times a day (BID) | ORAL | Status: DC

## 2021-02-21 MED ORDER — DULOXETINE HCL 30 MG PO CPEP
120.0000 mg | ORAL_CAPSULE | Freq: Every day | ORAL | Status: DC
  Administered 2021-02-21 – 2021-02-23 (×3): 120 mg via ORAL
  Filled 2021-02-21 (×4): qty 4

## 2021-02-21 MED ORDER — OLANZAPINE 5 MG PO TABS
15.0000 mg | ORAL_TABLET | Freq: Every day | ORAL | Status: DC
  Administered 2021-02-21 – 2021-02-22 (×2): 15 mg via ORAL
  Filled 2021-02-21 (×3): qty 1

## 2021-02-21 MED ORDER — PRAZOSIN HCL 5 MG PO CAPS
5.0000 mg | ORAL_CAPSULE | Freq: Every day | ORAL | Status: DC
  Administered 2021-02-21 – 2021-02-22 (×2): 5 mg via ORAL
  Filled 2021-02-21 (×3): qty 1

## 2021-02-21 MED ORDER — FAMOTIDINE 20 MG PO TABS: 20.0000 mg | ORAL_TABLET | Freq: Two times a day (BID) | ORAL | Status: DC | PRN

## 2021-02-21 MED ORDER — TRAZODONE HCL 50 MG PO TABS
50.0000 mg | ORAL_TABLET | Freq: Every evening | ORAL | Status: DC | PRN
  Administered 2021-02-22: 50 mg via ORAL
  Filled 2021-02-21: qty 1

## 2021-02-21 MED ORDER — ALBUTEROL SULFATE (2.5 MG/3ML) 0.083% IN NEBU
2.5000 mg | INHALATION_SOLUTION | Freq: Four times a day (QID) | RESPIRATORY_TRACT | Status: DC | PRN
  Administered 2021-02-21 – 2021-02-23 (×3): 2.5 mg via RESPIRATORY_TRACT
  Filled 2021-02-21 (×3): qty 3

## 2021-02-21 MED ORDER — SUCRALFATE 1 GM/10ML PO SUSP
1.0000 g | Freq: Three times a day (TID) | ORAL | Status: DC
  Administered 2021-02-21 – 2021-02-23 (×6): 1 g via ORAL
  Filled 2021-02-21 (×9): qty 10

## 2021-02-21 MED ORDER — ALPRAZOLAM 1 MG PO TABS
1.5000 mg | ORAL_TABLET | Freq: Every day | ORAL | Status: DC
  Administered 2021-02-21 – 2021-02-23 (×3): 1.5 mg via ORAL
  Filled 2021-02-21 (×3): qty 1

## 2021-02-21 MED ORDER — SUCRALFATE 1 GM/10ML PO SUSP: 1.0000 g | Freq: Three times a day (TID) | ORAL | Status: DC

## 2021-02-21 MED ORDER — HYDROCODONE-ACETAMINOPHEN 10-325 MG PO TABS
1.0000 | ORAL_TABLET | Freq: Four times a day (QID) | ORAL | Status: DC | PRN
  Administered 2021-02-21 (×2): 1 via ORAL
  Filled 2021-02-21 (×3): qty 1

## 2021-02-21 NOTE — H&P (View-Only) (Signed)
Referring Provider: Dr. Loleta Books Primary Care Physician:  Neale Burly, MD Primary Gastroenterologist:  Althia Forts  Reason for Consultation:  Coffee ground emesis  HPI: [name] is a 60 y.o. female with past medical history of IDA presenting for consultation of coffee ground emesis.  Patient reports black emesis that started three days ago.  She reports several episodes of emesis and states she was vomiting "all night."  She has not had any emesis today nor overnight.  Reports epigastric to LUQ abdominal pain.  Denies changes in stool, melena, or hematochezia.  Denies changes in appetite or unexplained weight loss.  Denies ASA, NSAID, or blood thinner use.  Unknown family history (adopted).  03/2020 EGD: Erythematous mucosa in the antrum. Biopsied. Large hiatal hernia.  03/2020 colonoscopy: Preparation of the colon was poor.  Diverticulosis in the ascending colon and in theproximal ascending colon. One 6 mm polyp in the mid ascending colon, removed with a cold biopsy forceps. Incomplete resection.    Past Medical History:  Diagnosis Date  . ADHD (attention deficit hyperactivity disorder)   . Anginal pain ([place])   . Asthma   . BMI (body mass index) 20.0-29.9 2009 127 LBS  . Chronic abdominal pain 2003   EX LAP APR 2004 RUPTURE L OV CYST, JUL 2004 ADHESIONS  . Chronic nausea   . Clostridium difficile colitis 04/10/11 &11/2011   tx w/ flagyl  . COPD (chronic obstructive pulmonary disease) ([place])   . Degenerative disc disease   . Depression   . GERD (gastroesophageal reflux disease)   . Hiatal hernia 2008   on EGD above  . Irritable bowel syndrome 2004 CONSTIPATION  . Migraines   . MRSA infection    Dr. [name], currently under treatment  . PTSD (post-traumatic stress disorder)   . PUD (peptic ulcer disease) 01/2007   EGD Dr [name], 2 antral ulcers, negative h pylori  . Rectal prolapse   . S/P colonoscopy 06/02/09   normal ([name])  . S/P endoscopy 03/26/11    gastritis-Dr [name]  . SBO (small bowel obstruction) ([facility])     Past Surgical History:  Procedure Laterality Date  . ABDOMINAL HYSTERECTOMY    . APPENDECTOMY    . BACK SURGERY    . BOWEL RESECTION     x2, secondary to adhesions  . CERVICAL SPINE SURGERY    . COLONOSCOPY  03/21/2012    ZOX:WRUEAV ADENOMA(1) POOR PREP  . COLONOSCOPY N/A 11/17/2013   WUJ:WJXBJY mucosa in the terminal ileum/NORMAL surgical anastomosis/Small internal hemorrhoids  . ESOPHAGOGASTRODUODENOSCOPY  03/23/2011    Normal esophagus without evidence of Barrett's, mass, erosions, or ulcerations./ Patchy erythema with occasional erosion in the antrum.  Biopsies  obtained via cold forceps to evaluate for H. pylori gastritis/ Small hiatal hernia./Normal duodenal bulb and second portion of the duodenum.  . ESOPHAGOGASTRODUODENOSCOPY  03/21/2012   NWG:NFAOZ Hiatal hernia/ABDOMINALPAIN/DIARRHEA MOST LIKELY DUE TO IBS, GASTRITIS DUODENITIS  . FLEXIBLE SIGMOIDOSCOPY  07/14/2002   Normal limited flexible sigmoidoscopy with stool in the rectum and rectosigmoid precluding a full colonoscopy  . JOINT REPLACEMENT    . LAPAROSCOPIC LYSIS INTESTINAL ADHESIONS    . NECK SURGERY  2009  . PARTIAL HYSTERECTOMY    . 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  . 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   Procedure: RIGHT TOTAL KNEE ARTHROPLASTY;  Surgeon: Marybelle Killings, MD;  Location: [facility];  Service: Orthopedics;  Laterality: Right;  . TUBAL LIGATION    . UMBILICAL HERNIA REPAIR  2008  x5  . UPPER GASTROINTESTINAL ENDOSCOPY  APR 2008 [facility] BLEEDING/PAIN   PUD  . UPPER GASTROINTESTINAL ENDOSCOPY  JUL 2012 [facility] PAIN   MILD GASTRITIS    Prior to Admission medications   Medication Sig Start Date End Date Taking? Authorizing Provider  albuterol (PROVENTIL) (2.5 MG/3ML) 0.083% nebulizer solution Take 2.5 mg by nebulization every 6 (six) hours as needed for wheezing or shortness of breath.   Yes [provider]  albuterol  (PROVENTIL,VENTOLIN) 90 MCG/ACT inhaler Inhale 2 puffs into the lungs every 6 (six) hours as needed for wheezing or shortness of breath. For shortness of breath   Yes [provider]  ALPRAZolam (XANAX) 1 MG tablet Take 1.5 mg by mouth daily. 02/11/21  Yes [provider]  DULoxetine (CYMBALTA) 60 MG capsule Take 120 mg by mouth daily.   Yes [provider]  famotidine (PEPCID) 20 MG tablet Take 20 mg by mouth 2 (two) times daily. 01/20/21  Yes [provider]  gabapentin (NEURONTIN) 300 MG capsule Take 300-600 mg by mouth See admin instructions. Take 300 mg in the AM and 600 mg at bedtime   Yes [provider]  HYDROcodone-acetaminophen (NORCO) 10-325 MG tablet Take 1 tablet by mouth 4 (four) times daily as needed for moderate pain. 02/15/21  Yes [provider]  mirtazapine (REMERON) 15 MG tablet Take 15 mg by mouth at bedtime as needed (for rest).  08/17/13  Yes [provider]  OLANZapine (ZYPREXA) 15 MG tablet Take 15 mg by mouth at bedtime. 01/26/21  Yes [provider]  pantoprazole (PROTONIX) 20 MG tablet Take 20 mg by mouth daily. 02/18/21  Yes [provider]  prazosin (MINIPRESS) 5 MG capsule Take 5 mg by mouth at bedtime. 05/23/17  Yes [provider]  promethazine (PHENERGAN) 25 MG suppository Place 25 mg rectally every 6 (six) hours as needed for nausea or vomiting. 12/23/20  Yes [provider]  promethazine (PHENERGAN) 25 MG tablet Take 12.5 mg by mouth every 6 (six) hours as needed for nausea or vomiting. 11/24/20  Yes [provider]  propranolol (INDERAL) 10 MG tablet Take 10 mg by mouth 2 (two) times daily. 01/20/21  Yes [provider]  tiZANidine (ZANAFLEX) 2 MG tablet Take 2 mg by mouth 2 (two) times daily as needed for muscle spasms. 02/15/21  Yes [provider]  traZODone (DESYREL) 50 MG tablet Take 50 mg by mouth at bedtime as needed for sleep. 05/23/17  Yes  [provider]  omeprazole (PRILOSEC) 20 MG capsule Take 1 capsule (20 mg total) by mouth 2 (two) times daily before a meal. Patient not taking: Reported on 02/20/2021 10/14/19   Merryl Hacker, MD    Scheduled Meds: . sodium chloride   Intravenous Once  . ALPRAZolam  1.5 mg Oral Daily  . DULoxetine  120 mg Oral Daily  . gabapentin  300 mg Oral Daily  . gabapentin  600 mg Oral QHS  . OLANZapine  15 mg Oral QHS  . pantoprazole (PROTONIX) IV  40 mg Intravenous Q12H  . prazosin  5 mg Oral QHS  . sodium chloride flush  3 mL Intravenous Q12H   Continuous Infusions: . lactated ringers 100 mL/hr at 02/21/21 0722  . promethazine (PHENERGAN) injection (IM or IVPB)     PRN Meds:.acetaminophen **OR** acetaminophen, albuterol, famotidine, HYDROcodone-acetaminophen, ondansetron **OR** ondansetron (ZOFRAN) IV, prochlorperazine, promethazine (PHENERGAN) injection (IM or IVPB)  Allergies as of 02/20/2021 - Review Complete 02/20/2021  Allergen Reaction Noted  .  Penicillins Anaphylaxis and Other (See Comments)   . Clarithromycin Nausea And Vomiting 03/14/2016  . Moxifloxacin Nausea And Vomiting 03/14/2016  . Quinolones Nausea And Vomiting 03/14/2016  . Salicylates Nausea And Vomiting 03/14/2016  . Lactose intolerance (gi) Diarrhea 04/18/2014  . Lactulose Diarrhea 04/18/2014  . Aspirin Other (See Comments)   . Buprenorphine hcl Itching and Other (See Comments) 01/05/2015  . Ibuprofen Nausea Only and Other (See Comments) 10/08/2011  . Morphine and related Itching 07/15/2011  . Sulfa antibiotics Itching 05/24/2011    Family History  Adopted: Yes    Social History   Socioeconomic History  . Marital status: Divorced    Spouse name: Not on file  . Number of children: 1  . Years of education: Not on file  . Highest education level: Not on file  Occupational History  . Occupation: disabled  Tobacco Use  . Smoking status: Current Every Day Smoker    Packs/day: 0.50    Years:  10.00    Pack years: 5.00    Types: Cigarettes  . Smokeless tobacco: Never Used  Vaping Use  . Vaping Use: Former  Substance and Sexual Activity  . Alcohol use: Not Currently    Comment: 05/07/2017 "quit when I was 19; drank for 1 year total"  . Drug use: No  . Sexual activity: Never    Birth control/protection: Surgical  Other Topics Concern  . Not on file  Social History Narrative  . Not on file   Social Determinants of Health   Financial Resource Strain: Not on file  Food Insecurity: Not on file  Transportation Needs: Not on file  Physical Activity: Not on file  Stress: Not on file  Social Connections: Not on file  Intimate Partner Violence: Not on file    Review of Systems: Review of Systems  Constitutional: Negative for chills, fever and weight loss.  HENT: Negative for hearing loss and tinnitus.   Eyes: Negative for pain and redness.  Respiratory: Negative for cough and shortness of breath.   Cardiovascular: Negative for chest pain and palpitations.  Gastrointestinal: Positive for abdominal pain, heartburn, nausea and vomiting. Negative for blood in stool, constipation, diarrhea and melena.  Genitourinary: Negative for flank pain and frequency.  Musculoskeletal: Negative for falls and joint pain.  Skin: Negative for itching and rash.  Neurological: Negative for seizures and loss of consciousness.  Endo/Heme/Allergies: Negative for polydipsia. Does not bruise/bleed easily.  Psychiatric/Behavioral: Negative for substance abuse. The patient is not nervous/anxious.      Physical Exam: Vital signs: Vitals:   02/21/21 0504 02/21/21 0717  BP: 121/68 112/73  Pulse: 69 77  Resp: 18 18  Temp: 98.2 F (36.8 C) 98.3 F (36.8 C)  SpO2: 98% 98%   Last BM Date: 02/20/21  Physical Exam Vitals reviewed.  Constitutional:      General: She is not in acute distress. HENT:     Head: Normocephalic and atraumatic.     Nose: Nose normal. No congestion.     Mouth/Throat:      Mouth: Mucous membranes are moist.     Pharynx: Oropharynx is clear.  Eyes:     General: No scleral icterus.    Extraocular Movements: Extraocular movements intact.     Comments: conjunctival pallor  Cardiovascular:     Rate and Rhythm: Normal rate and regular rhythm.     Pulses: Normal pulses.  Pulmonary:     Effort: Pulmonary effort is normal. No respiratory distress.  Abdominal:  General: Bowel sounds are normal. There is no distension.     Palpations: Abdomen is soft. There is no mass.     Tenderness: There is abdominal tenderness (epigastric, LUQ). There is no guarding or rebound.  Musculoskeletal:        General: No swelling or tenderness.     Cervical back: Normal range of motion and neck supple.  Skin:    General: Skin is warm and dry.  Neurological:     General: No focal deficit present.     Mental Status: She is alert and oriented to person, place, and time.  Psychiatric:        Mood and Affect: Mood normal.        Behavior: Behavior normal. Behavior is cooperative.     GI:  Lab Results: Recent Labs    02/20/21 2310 02/21/21 1005  WBC 6.1 6.9  HGB 6.2* 8.7*  HCT 23.6* 30.1*  PLT 125* 464*   BMET Recent Labs    02/20/21 2310 02/21/21 1005  NA 134* 138  K 5.9* 4.0  CL 102 105  CO2 25 27  GLUCOSE 99 101*  BUN 12 12  CREATININE 0.64 0.52  CALCIUM 8.6* 8.8*   LFT No results for input(s): PROT, ALBUMIN, AST, ALT, ALKPHOS, BILITOT, BILIDIR, IBILI in the last 72 hours. PT/INR No results for input(s): LABPROT, INR in the last 72 hours.   Studies/Results: No results found.  Impression: Coffee ground emesis: Cameron erosions vs. Gastritis vs. PUD -Hgb 8.7, improved from 6.2 yesterday after 2u pRBCs -Normal BUN/Cr -Low ferritin (6)  Plan: EGD tomorrow for further evaluation.  I thoroughly discussed procedure with the patient to include nature, alternatives, benefits, and risks (including but not limited to bleeding, infection, perforation,  anesthesia/cardiac and pulmonary complications).  Patient verbalized understanding gave verbal consent to proceed with EGD.  Soft diet, n.p.o. at midnight.  Continue Protonix 40 mg IV twice daily.  Start Carafate 4 times daily.  Continue monitor H&H with transfusion as needed to maintain hemoglobin greater than 7.  [facility] will follow.   LOS: 1 day   Salley Slaughter  PA-C 02/21/2021, 11:33 AM  Contact #  [phone]

## 2021-02-21 NOTE — Progress Notes (Signed)
[facility] PROGRESS NOTE    [name]  ACZ:[id number] DOB: [birth year] DOA: 02/20/2021 PCP: Chelsea Burly, MD      Brief Narrative:  [name] is a 60 y.o. F with COPD not on O2, former smoker, anxiety, chronic pain on daily Vicodin x4, one episode pneumonia-associated Afib, not on anticoagulation, and hx IBS who presented with hematemesis.  Several days ago, patient developed vomiting and epigastric pain/heartburn.  She vomited numerous times and noticed there was "dark black" color to vomit, so she came to the ER.  Denied NSAIDs.  Not prescribed anticoagulants.  Initially found to have Hgb 7.1 (from baseline 9 g/dL).  She was advised to be admitted and left AMA.  She returned two days later to [facility], with Hgb 6.9 g/dL and still vomiting and so she was admitted and transferred to [facility] for GI consultation.        Assessment & Plan:  Acute blood loss anemia Acute GI bleed No active hematemesis, no hemodynamic instability and no melena today. - Continue IV PPI - Continue IV fluids - Consult GI, appreciate cares  - Start carafate given history gastritis, hiatal hernia - Resume home Pepcid - Trend Hgb  COPD No active disease, not on ICS-LABA - Continue PRN SABA  Chronic pain syndrome - Continue home Vicodin - Continue home gabapentin, duloxetine  Depression Anxiety PTSD - Continue home Xanax, olanzapine, prazosin, mirtazapine, trazodone   Hyperkalemia Resolved with fluids          Disposition: Status is: Inpatient  Remains inpatient appropriate because:Ongoing diagnostic testing needed not appropriate for outpatient work up   Dispo: The patient is from: Home              Anticipated d/c is to: Home              Patient currently is not medically stable to d/c.   Difficult to place patient No       Level of care: Med-Surg       MDM: The below labs and imaging reports were reviewed and summarized above.   Medication management as above.    DVT prophylaxis: SCDs Start: 02/20/21 2205  Code Status: FULL Family Communication:            Subjective: Has some epigastric pain, some heartburn, no hematemesis, no diarrhea, no melena.  No confusion.  Weak generally.  Objective: Vitals:   02/21/21 0229 02/21/21 0438 02/21/21 0504 02/21/21 0717  BP:  127/65 121/68 112/73  Pulse:  70 69 77  Resp:  18 18 18   Temp:  98.5 F (36.9 C) 98.2 F (36.8 C) 98.3 F (36.8 C)  TempSrc:  Oral Oral Oral  SpO2: 92% 98% 98% 98%    Intake/Output Summary (Last 24 hours) at 02/21/2021 1335 Last data filed at 02/21/2021 1022 Gross per 24 hour  Intake 1021.79 ml  Output 0 ml  Net 1021.79 ml   There were no vitals filed for this visit.  Examination: General appearance:  adult female, alert and in no acute distress.  Lying in bed, appears tired. HEENT: Anicteric, conjunctiva pink, lids and lashes normal. No nasal deformity, discharge, epistaxis.  Lips normal, dentures in place. Skin: Warm and dry.  No jaundice.  No suspicious rashes or lesions. Cardiac: RRR, nl S1-S2, no murmurs appreciated.  Capillary refill is brisk.  JVP not visible.  No LE edema.  Radial pulses 2+ and symmetric. Respiratory: Normal respiratory rate and rhythm.  CTAB without rales  or wheezes. Abdomen: Abdomen soft.  Mild epigastric TTP without guarding or rebound. No ascites, distension, hepatosplenomegaly.   MSK: No deformities or effusions. Neuro: Awake and alert.  EOMI, moves all extremities. Speech fluent.    Psych: Sensorium intact and responding to questions, attention normal. Affect normal.  Judgment and insight appear normal.    Data Reviewed: I have personally reviewed following labs and imaging studies:  CBC: Recent Labs  Lab 02/20/21 2310 02/21/21 1005  WBC 6.1 6.9  HGB 6.2* 8.7*  HCT 23.6* 30.1*  MCV 72.8* 75.3*  PLT 125* 716*   Basic Metabolic Panel: Recent Labs  Lab 02/20/21 2310 02/21/21 1005  NA  134* 138  K 5.9* 4.0  CL 102 105  CO2 25 27  GLUCOSE 99 101*  BUN 12 12  CREATININE 0.64 0.52  CALCIUM 8.6* 8.8*   GFR: CrCl cannot be calculated (Unknown ideal weight.). Liver Function Tests: No results for input(s): AST, ALT, ALKPHOS, BILITOT, PROT, ALBUMIN in the last 168 hours. No results for input(s): LIPASE, AMYLASE in the last 168 hours. No results for input(s): AMMONIA in the last 168 hours. Coagulation Profile: No results for input(s): INR, PROTIME in the last 168 hours. Cardiac Enzymes: No results for input(s): CKTOTAL, CKMB, CKMBINDEX, TROPONINI in the last 168 hours. BNP (last 3 results) No results for input(s): PROBNP in the last 8760 hours. HbA1C: No results for input(s): HGBA1C in the last 72 hours. CBG: No results for input(s): GLUCAP in the last 168 hours. Lipid Profile: No results for input(s): CHOL, HDL, LDLCALC, TRIG, CHOLHDL, LDLDIRECT in the last 72 hours. Thyroid Function Tests: No results for input(s): TSH, T4TOTAL, FREET4, T3FREE, THYROIDAB in the last 72 hours. Anemia Panel: Recent Labs    02/20/21 2310  VITAMINB12 180  FOLATE 62.1  FERRITIN 6*  TIBC 561*  IRON 84   Urine analysis:    Component Value Date/Time   COLORURINE STRAW (A) 02/01/2020 1337   APPEARANCEUR CLEAR 02/01/2020 1337   LABSPEC 1.025 02/01/2020 1337   PHURINE 5.0 02/01/2020 1337   GLUCOSEU NEGATIVE 02/01/2020 1337   GLUCOSEU NEG mg/dL 10/21/2009 1550   HGBUR NEGATIVE 02/01/2020 1337   BILIRUBINUR NEGATIVE 02/01/2020 1337   KETONESUR NEGATIVE 02/01/2020 1337   PROTEINUR NEGATIVE 02/01/2020 1337   UROBILINOGEN 0.2 12/07/2014 1811   NITRITE NEGATIVE 02/01/2020 1337   LEUKOCYTESUR NEGATIVE 02/01/2020 1337   Sepsis Labs: @LABRCNTIP (procalcitonin:4,lacticacidven:4)  )No results found for this or any previous visit (from the past 240 hour(s)).       Radiology Studies: No results found.      Scheduled Meds: . sodium chloride   Intravenous Once  . ALPRAZolam   1.5 mg Oral Daily  . DULoxetine  120 mg Oral Daily  . gabapentin  300 mg Oral Daily  . gabapentin  600 mg Oral QHS  . OLANZapine  15 mg Oral QHS  . pantoprazole (PROTONIX) IV  40 mg Intravenous Q12H  . prazosin  5 mg Oral QHS  . sodium chloride flush  3 mL Intravenous Q12H  . sucralfate  1 g Oral TID WC & HS   Continuous Infusions: . promethazine (PHENERGAN) injection (IM or IVPB)       LOS: 1 day    Time spent: 25 minutes    Edwin Dada, MD [facility] 02/21/2021, 1:35 PM     Please page though [place] or Epic secure chat:  For [employer], Adult nurse

## 2021-02-21 NOTE — Consult Note (Signed)
Referring Provider: Dr. Loleta Books Primary Care Physician:  Neale Burly, MD Primary Gastroenterologist:  Althia Forts  Reason for Consultation:  Coffee ground emesis  HPI: [name] is a 60 y.o. female with past medical history of IDA presenting for consultation of coffee ground emesis.  Patient reports black emesis that started three days ago.  She reports several episodes of emesis and states she was vomiting "all night."  She has not had any emesis today nor overnight.  Reports epigastric to LUQ abdominal pain.  Denies changes in stool, melena, or hematochezia.  Denies changes in appetite or unexplained weight loss.  Denies ASA, NSAID, or blood thinner use.  Unknown family history (adopted).  03/2020 EGD: Erythematous mucosa in the antrum. Biopsied. Large hiatal hernia.  03/2020 colonoscopy: Preparation of the colon was poor.  Diverticulosis in the ascending colon and in theproximal ascending colon. One 6 mm polyp in the mid ascending colon, removed with a cold biopsy forceps. Incomplete resection.    Past Medical History:  Diagnosis Date  . ADHD (attention deficit hyperactivity disorder)   . Anginal pain ([place])   . Asthma   . BMI (body mass index) 20.0-29.9 2009 127 LBS  . Chronic abdominal pain 2003   EX LAP APR 2004 RUPTURE L OV CYST, JUL 2004 ADHESIONS  . Chronic nausea   . Clostridium difficile colitis 04/10/11 &11/2011   tx w/ flagyl  . COPD (chronic obstructive pulmonary disease) ([place])   . Degenerative disc disease   . Depression   . GERD (gastroesophageal reflux disease)   . Hiatal hernia 2008   on EGD above  . Irritable bowel syndrome 2004 CONSTIPATION  . Migraines   . MRSA infection    Dr. [name], currently under treatment  . PTSD (post-traumatic stress disorder)   . PUD (peptic ulcer disease) 01/2007   EGD Dr [name], 2 antral ulcers, negative h pylori  . Rectal prolapse   . S/P colonoscopy 06/02/09   normal ([name])  . S/P endoscopy 03/26/11    gastritis-Dr [name]  . SBO (small bowel obstruction) ([facility])     Past Surgical History:  Procedure Laterality Date  . ABDOMINAL HYSTERECTOMY    . APPENDECTOMY    . BACK SURGERY    . BOWEL RESECTION     x2, secondary to adhesions  . CERVICAL SPINE SURGERY    . COLONOSCOPY  03/21/2012    ZOX:WRUEAV ADENOMA(1) POOR PREP  . COLONOSCOPY N/A 11/17/2013   WUJ:WJXBJY mucosa in the terminal ileum/NORMAL surgical anastomosis/Small internal hemorrhoids  . ESOPHAGOGASTRODUODENOSCOPY  03/23/2011    Normal esophagus without evidence of Barrett's, mass, erosions, or ulcerations./ Patchy erythema with occasional erosion in the antrum.  Biopsies  obtained via cold forceps to evaluate for H. pylori gastritis/ Small hiatal hernia./Normal duodenal bulb and second portion of the duodenum.  . ESOPHAGOGASTRODUODENOSCOPY  03/21/2012   NWG:NFAOZ Hiatal hernia/ABDOMINALPAIN/DIARRHEA MOST LIKELY DUE TO IBS, GASTRITIS DUODENITIS  . FLEXIBLE SIGMOIDOSCOPY  07/14/2002   Normal limited flexible sigmoidoscopy with stool in the rectum and rectosigmoid precluding a full colonoscopy  . JOINT REPLACEMENT    . LAPAROSCOPIC LYSIS INTESTINAL ADHESIONS    . NECK SURGERY  2009  . PARTIAL HYSTERECTOMY    . 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  . 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   Procedure: RIGHT TOTAL KNEE ARTHROPLASTY;  Surgeon: Marybelle Killings, MD;  Location: [facility];  Service: Orthopedics;  Laterality: Right;  . TUBAL LIGATION    . UMBILICAL HERNIA REPAIR  2008  x5  . UPPER GASTROINTESTINAL ENDOSCOPY  APR 2008 [facility] BLEEDING/PAIN   PUD  . UPPER GASTROINTESTINAL ENDOSCOPY  JUL 2012 [facility] PAIN   MILD GASTRITIS    Prior to Admission medications   Medication Sig Start Date End Date Taking? Authorizing Provider  albuterol (PROVENTIL) (2.5 MG/3ML) 0.083% nebulizer solution Take 2.5 mg by nebulization every 6 (six) hours as needed for wheezing or shortness of breath.   Yes [provider]  albuterol  (PROVENTIL,VENTOLIN) 90 MCG/ACT inhaler Inhale 2 puffs into the lungs every 6 (six) hours as needed for wheezing or shortness of breath. For shortness of breath   Yes [provider]  ALPRAZolam (XANAX) 1 MG tablet Take 1.5 mg by mouth daily. 02/11/21  Yes [provider]  DULoxetine (CYMBALTA) 60 MG capsule Take 120 mg by mouth daily.   Yes [provider]  famotidine (PEPCID) 20 MG tablet Take 20 mg by mouth 2 (two) times daily. 01/20/21  Yes [provider]  gabapentin (NEURONTIN) 300 MG capsule Take 300-600 mg by mouth See admin instructions. Take 300 mg in the AM and 600 mg at bedtime   Yes [provider]  HYDROcodone-acetaminophen (NORCO) 10-325 MG tablet Take 1 tablet by mouth 4 (four) times daily as needed for moderate pain. 02/15/21  Yes [provider]  mirtazapine (REMERON) 15 MG tablet Take 15 mg by mouth at bedtime as needed (for rest).  08/17/13  Yes [provider]  OLANZapine (ZYPREXA) 15 MG tablet Take 15 mg by mouth at bedtime. 01/26/21  Yes [provider]  pantoprazole (PROTONIX) 20 MG tablet Take 20 mg by mouth daily. 02/18/21  Yes [provider]  prazosin (MINIPRESS) 5 MG capsule Take 5 mg by mouth at bedtime. 05/23/17  Yes [provider]  promethazine (PHENERGAN) 25 MG suppository Place 25 mg rectally every 6 (six) hours as needed for nausea or vomiting. 12/23/20  Yes [provider]  promethazine (PHENERGAN) 25 MG tablet Take 12.5 mg by mouth every 6 (six) hours as needed for nausea or vomiting. 11/24/20  Yes [provider]  propranolol (INDERAL) 10 MG tablet Take 10 mg by mouth 2 (two) times daily. 01/20/21  Yes [provider]  tiZANidine (ZANAFLEX) 2 MG tablet Take 2 mg by mouth 2 (two) times daily as needed for muscle spasms. 02/15/21  Yes [provider]  traZODone (DESYREL) 50 MG tablet Take 50 mg by mouth at bedtime as needed for sleep. 05/23/17  Yes  [provider]  omeprazole (PRILOSEC) 20 MG capsule Take 1 capsule (20 mg total) by mouth 2 (two) times daily before a meal. Patient not taking: Reported on 02/20/2021 10/14/19   Merryl Hacker, MD    Scheduled Meds: . sodium chloride   Intravenous Once  . ALPRAZolam  1.5 mg Oral Daily  . DULoxetine  120 mg Oral Daily  . gabapentin  300 mg Oral Daily  . gabapentin  600 mg Oral QHS  . OLANZapine  15 mg Oral QHS  . pantoprazole (PROTONIX) IV  40 mg Intravenous Q12H  . prazosin  5 mg Oral QHS  . sodium chloride flush  3 mL Intravenous Q12H   Continuous Infusions: . lactated ringers 100 mL/hr at 02/21/21 0722  . promethazine (PHENERGAN) injection (IM or IVPB)     PRN Meds:.acetaminophen **OR** acetaminophen, albuterol, famotidine, HYDROcodone-acetaminophen, ondansetron **OR** ondansetron (ZOFRAN) IV, prochlorperazine, promethazine (PHENERGAN) injection (IM or IVPB)  Allergies as of 02/20/2021 - Review Complete 02/20/2021  Allergen Reaction Noted  .  Penicillins Anaphylaxis and Other (See Comments)   . Clarithromycin Nausea And Vomiting 03/14/2016  . Moxifloxacin Nausea And Vomiting 03/14/2016  . Quinolones Nausea And Vomiting 03/14/2016  . Salicylates Nausea And Vomiting 03/14/2016  . Lactose intolerance (gi) Diarrhea 04/18/2014  . Lactulose Diarrhea 04/18/2014  . Aspirin Other (See Comments)   . Buprenorphine hcl Itching and Other (See Comments) 01/05/2015  . Ibuprofen Nausea Only and Other (See Comments) 10/08/2011  . Morphine and related Itching 07/15/2011  . Sulfa antibiotics Itching 05/24/2011    Family History  Adopted: Yes    Social History   Socioeconomic History  . Marital status: Divorced    Spouse name: Not on file  . Number of children: 1  . Years of education: Not on file  . Highest education level: Not on file  Occupational History  . Occupation: disabled  Tobacco Use  . Smoking status: Current Every Day Smoker    Packs/day: 0.50    Years:  10.00    Pack years: 5.00    Types: Cigarettes  . Smokeless tobacco: Never Used  Vaping Use  . Vaping Use: Former  Substance and Sexual Activity  . Alcohol use: Not Currently    Comment: 05/07/2017 "quit when I was 19; drank for 1 year total"  . Drug use: No  . Sexual activity: Never    Birth control/protection: Surgical  Other Topics Concern  . Not on file  Social History Narrative  . Not on file   Social Determinants of Health   Financial Resource Strain: Not on file  Food Insecurity: Not on file  Transportation Needs: Not on file  Physical Activity: Not on file  Stress: Not on file  Social Connections: Not on file  Intimate Partner Violence: Not on file    Review of Systems: Review of Systems  Constitutional: Negative for chills, fever and weight loss.  HENT: Negative for hearing loss and tinnitus.   Eyes: Negative for pain and redness.  Respiratory: Negative for cough and shortness of breath.   Cardiovascular: Negative for chest pain and palpitations.  Gastrointestinal: Positive for abdominal pain, heartburn, nausea and vomiting. Negative for blood in stool, constipation, diarrhea and melena.  Genitourinary: Negative for flank pain and frequency.  Musculoskeletal: Negative for falls and joint pain.  Skin: Negative for itching and rash.  Neurological: Negative for seizures and loss of consciousness.  Endo/Heme/Allergies: Negative for polydipsia. Does not bruise/bleed easily.  Psychiatric/Behavioral: Negative for substance abuse. The patient is not nervous/anxious.      Physical Exam: Vital signs: Vitals:   02/21/21 0504 02/21/21 0717  BP: 121/68 112/73  Pulse: 69 77  Resp: 18 18  Temp: 98.2 F (36.8 C) 98.3 F (36.8 C)  SpO2: 98% 98%   Last BM Date: 02/20/21  Physical Exam Vitals reviewed.  Constitutional:      General: She is not in acute distress. HENT:     Head: Normocephalic and atraumatic.     Nose: Nose normal. No congestion.     Mouth/Throat:      Mouth: Mucous membranes are moist.     Pharynx: Oropharynx is clear.  Eyes:     General: No scleral icterus.    Extraocular Movements: Extraocular movements intact.     Comments: conjunctival pallor  Cardiovascular:     Rate and Rhythm: Normal rate and regular rhythm.     Pulses: Normal pulses.  Pulmonary:     Effort: Pulmonary effort is normal. No respiratory distress.  Abdominal:  General: Bowel sounds are normal. There is no distension.     Palpations: Abdomen is soft. There is no mass.     Tenderness: There is abdominal tenderness (epigastric, LUQ). There is no guarding or rebound.  Musculoskeletal:        General: No swelling or tenderness.     Cervical back: Normal range of motion and neck supple.  Skin:    General: Skin is warm and dry.  Neurological:     General: No focal deficit present.     Mental Status: She is alert and oriented to person, place, and time.  Psychiatric:        Mood and Affect: Mood normal.        Behavior: Behavior normal. Behavior is cooperative.     GI:  Lab Results: Recent Labs    02/20/21 2310 02/21/21 1005  WBC 6.1 6.9  HGB 6.2* 8.7*  HCT 23.6* 30.1*  PLT 125* 464*   BMET Recent Labs    02/20/21 2310 02/21/21 1005  NA 134* 138  K 5.9* 4.0  CL 102 105  CO2 25 27  GLUCOSE 99 101*  BUN 12 12  CREATININE 0.64 0.52  CALCIUM 8.6* 8.8*   LFT No results for input(s): PROT, ALBUMIN, AST, ALT, ALKPHOS, BILITOT, BILIDIR, IBILI in the last 72 hours. PT/INR No results for input(s): LABPROT, INR in the last 72 hours.   Studies/Results: No results found.  Impression: Coffee ground emesis: Cameron erosions vs. Gastritis vs. PUD -Hgb 8.7, improved from 6.2 yesterday after 2u pRBCs -Normal BUN/Cr -Low ferritin (6)  Plan: EGD tomorrow for further evaluation.  I thoroughly discussed procedure with the patient to include nature, alternatives, benefits, and risks (including but not limited to bleeding, infection, perforation,  anesthesia/cardiac and pulmonary complications).  Patient verbalized understanding gave verbal consent to proceed with EGD.  Soft diet, n.p.o. at midnight.  Continue Protonix 40 mg IV twice daily.  Start Carafate 4 times daily.  Continue monitor H&H with transfusion as needed to maintain hemoglobin greater than 7.  [facility] will follow.   LOS: 1 day   Salley Slaughter  PA-C 02/21/2021, 11:33 AM  Contact #  [phone]

## 2021-02-22 ENCOUNTER — Inpatient Hospital Stay (HOSPITAL_COMMUNITY): Payer: Medicare Other | Admitting: Anesthesiology

## 2021-02-22 ENCOUNTER — Encounter (HOSPITAL_COMMUNITY): Payer: Self-pay | Admitting: Internal Medicine

## 2021-02-22 ENCOUNTER — Encounter (HOSPITAL_COMMUNITY): Admission: AD | Disposition: A | Discharge status: Home / Self Care | Payer: Self-pay | Attending: Internal Medicine

## 2021-02-22 DIAGNOSIS — F418 Other specified anxiety disorders: Secondary | ICD-10-CM

## 2021-02-22 DIAGNOSIS — D62 Acute posthemorrhagic anemia: Secondary | ICD-10-CM | POA: Diagnosis not present

## 2021-02-22 DIAGNOSIS — J439 Emphysema, unspecified: Secondary | ICD-10-CM | POA: Diagnosis not present

## 2021-02-22 DIAGNOSIS — K922 Gastrointestinal hemorrhage, unspecified: Secondary | ICD-10-CM | POA: Diagnosis not present

## 2021-02-22 HISTORY — PX: ESOPHAGOGASTRODUODENOSCOPY: SHX5428

## 2021-02-22 LAB — BPAM RBC
Blood Product Expiration Date: 202206042359
Blood Product Expiration Date: 202206042359
ISSUE DATE / TIME: 202205170148
ISSUE DATE / TIME: 202205170445
Unit Type and Rh: 6200
Unit Type and Rh: 6200

## 2021-02-22 LAB — CBC
HCT: 30.3 % — ABNORMAL LOW (ref 36.0–46.0)
Hemoglobin: 8.7 g/dL — ABNORMAL LOW (ref 12.0–15.0)
MCH: 21.5 pg — ABNORMAL LOW (ref 26.0–34.0)
MCHC: 28.7 g/dL — ABNORMAL LOW (ref 30.0–36.0)
MCV: 74.8 fL — ABNORMAL LOW (ref 80.0–100.0)
Platelets: 415 10*3/uL — ABNORMAL HIGH (ref 150–400)
RBC: 4.05 MIL/uL (ref 3.87–5.11)
RDW: 21.7 % — ABNORMAL HIGH (ref 11.5–15.5)
WBC: 5.9 10*3/uL (ref 4.0–10.5)
nRBC: 0.5 % — ABNORMAL HIGH (ref 0.0–0.2)

## 2021-02-22 LAB — BASIC METABOLIC PANEL
Anion gap: 4 — ABNORMAL LOW (ref 5–15)
BUN: 18 mg/dL (ref 8–23)
CO2: 33 mmol/L — ABNORMAL HIGH (ref 22–32)
Calcium: 8.9 mg/dL (ref 8.9–10.3)
Chloride: 104 mmol/L (ref 98–111)
Creatinine, Ser: 0.43 mg/dL — ABNORMAL LOW (ref 0.44–1.00)
GFR, Estimated: >60 mL/min (ref >60)
Glucose, Bld: 115 mg/dL — ABNORMAL HIGH (ref 70–99)
Potassium: 3.8 mmol/L (ref 3.5–5.1)
Sodium: 141 mmol/L (ref 135–145)

## 2021-02-22 LAB — TYPE AND SCREEN
ABO/RH(D): A POS
Antibody Screen: NEGATIVE
Unit division: 0
Unit division: 0

## 2021-02-22 SURGERY — EGD (ESOPHAGOGASTRODUODENOSCOPY)
Anesthesia: Monitor Anesthesia Care

## 2021-02-22 MED ORDER — PROPOFOL 500 MG/50ML IV EMUL
INTRAVENOUS | Status: DC | PRN
  Administered 2021-02-22: 135 ug/kg/min via INTRAVENOUS

## 2021-02-22 MED ORDER — LACTATED RINGERS IV SOLN: INTRAVENOUS | Status: DC | PRN

## 2021-02-22 MED ORDER — PROPOFOL 500 MG/50ML IV EMUL
INTRAVENOUS | Status: DC | PRN
  Administered 2021-02-22 (×2): 30 mg via INTRAVENOUS
  Administered 2021-02-22: 10 mg via INTRAVENOUS

## 2021-02-22 MED ORDER — LIDOCAINE HCL (CARDIAC) PF 100 MG/5ML IV SOSY
PREFILLED_SYRINGE | INTRAVENOUS | Status: DC | PRN
  Administered 2021-02-22: 50 mg via INTRAVENOUS

## 2021-02-22 MED ORDER — ONDANSETRON HCL 4 MG/2ML IJ SOLN
INTRAMUSCULAR | Status: DC | PRN
  Administered 2021-02-22: 4 mg via INTRAVENOUS

## 2021-02-22 MED ORDER — EPHEDRINE SULFATE 50 MG/ML IJ SOLN
INTRAMUSCULAR | Status: DC | PRN
  Administered 2021-02-22 (×2): 10 mg via INTRAVENOUS
  Administered 2021-02-22: 5 mg via INTRAVENOUS
  Administered 2021-02-22: 10 mg via INTRAVENOUS
  Administered 2021-02-22: 5 mg via INTRAVENOUS
  Administered 2021-02-22: 10 mg via INTRAVENOUS

## 2021-02-22 NOTE — Interval H&P Note (Signed)
History and Physical Interval Note:  02/22/2021 9:21 AM  [name]  has presented today for surgery, with the diagnosis of coffee ground emesis.  The various methods of treatment have been discussed with the patient and family. After consideration of risks, benefits and other options for treatment, the patient has consented to  Procedure(s): ESOPHAGOGASTRODUODENOSCOPY (EGD) (N/A) as a surgical intervention.  The patient's history has been reviewed, patient examined, no change in status, stable for surgery.  I have reviewed the patient's chart and labs.  Questions were answered to the patient's satisfaction.     Lear Ng

## 2021-02-22 NOTE — Anesthesia Preprocedure Evaluation (Addendum)
Anesthesia Evaluation  Patient identified by MRN, date of birth, ID band Patient awake    Reviewed: Allergy & Precautions, NPO status , Patient's Chart, lab work & pertinent test results  Airway Mallampati: I  TM Distance: >3 FB Neck ROM: Full    Dental no notable dental hx. (+) Upper Dentures, Lower Dentures   Pulmonary asthma , COPD,  COPD inhaler, former smoker,  Quit smoking 6 mos. ago   Pulmonary exam normal breath sounds clear to auscultation       Cardiovascular + angina with exertion Normal cardiovascular exam Rhythm:Regular Rate:Normal     Neuro/Psych  Headaches, PSYCHIATRIC DISORDERS Anxiety Depression ADHD PTSD Neuromuscular disease    GI/Hepatic Neg liver ROS, hiatal hernia, PUD, GERD  Medicated and Controlled,Chronic abdominal pain Nausea Coffee ground emesis   Endo/Other  negative endocrine ROS  Renal/GU negative Renal ROS  negative genitourinary   Musculoskeletal  (+) Arthritis , Osteoarthritis,    Abdominal   Peds  Hematology  (+) anemia ,   Anesthesia Other Findings   Reproductive/Obstetrics                            Anesthesia Physical Anesthesia Plan  ASA: III  Anesthesia Plan: MAC   Post-op Pain Management:    Induction:   PONV Risk Score and Plan: 1 and Treatment may vary due to age or medical condition and Propofol infusion  Airway Management Planned: Natural Airway and Nasal Cannula  Additional Equipment:   Intra-op Plan:   Post-operative Plan:   Informed Consent: I have reviewed the patients History and Physical, chart, labs and discussed the procedure including the risks, benefits and alternatives for the proposed anesthesia with the patient or authorized representative who has indicated his/her understanding and acceptance.       Plan Discussed with: CRNA and Anesthesiologist  Anesthesia Plan Comments:         Anesthesia Quick  Evaluation

## 2021-02-22 NOTE — Transfer of Care (Signed)
Immediate Anesthesia Transfer of Care Note  Patient: [name]  Procedure(s) Performed: Procedure(s): ESOPHAGOGASTRODUODENOSCOPY (EGD) (N/A)  Patient Location: PACU  Anesthesia Type:MAC  Level of Consciousness:  sedated, patient cooperative and responds to stimulation  Airway & Oxygen Therapy:Patient Spontanous Breathing and Patient connected to face mask oxgen  Post-op Assessment:  Report given to PACU RN and Post -op Vital signs reviewed and stable  Post vital signs:  Reviewed and stable  Last Vitals:  Vitals:   02/22/21 0853 02/22/21 0955  BP: 110/61 (!) 91/44  Pulse: 70 75  Resp: 13 11  Temp: 37.1 C   SpO2: 69% 629%    Complications: No apparent anesthesia complications

## 2021-02-22 NOTE — Anesthesia Postprocedure Evaluation (Signed)
Anesthesia Post Note  Patient: [name]  Procedure(s) Performed: ESOPHAGOGASTRODUODENOSCOPY (EGD) (N/A )     Patient location during evaluation: PACU Anesthesia Type: MAC Level of consciousness: awake and alert and oriented Pain management: pain level controlled Vital Signs Assessment: post-procedure vital signs reviewed and stable Respiratory status: spontaneous breathing, nonlabored ventilation and respiratory function stable Cardiovascular status: stable and blood pressure returned to baseline Postop Assessment: no apparent nausea or vomiting Anesthetic complications: no   No complications documented.  Last Vitals:  Vitals:   02/22/21 0955 02/22/21 1000  BP: (!) 91/44 (!) 83/49  Pulse: 75 82  Resp: 11 15  Temp: 36.6 C   SpO2: 100% 100%    Last Pain:  Vitals:   02/22/21 0955  TempSrc: Oral  PainSc: 0-No pain                 Addi Pak A.

## 2021-02-22 NOTE — Op Note (Signed)
[facility] Patient Name: [name] Procedure Date: 02/22/2021 MRN: [id number] Attending MD: Lear Ng , MD Date of Birth: Nov 01, 1960 CSN: [id number] Age: 60 Admit Type: Inpatient Procedure:                Upper GI endoscopy Indications:              Coffee-ground emesis Providers:                Lear Ng, MD, Carmie End, RN, Elspeth Cho Tech., Technician, Arnoldo Hooker, CRNA Referring MD:             hospital team Medicines:                Propofol per Anesthesia, Monitored Anesthesia Care Complications:            No immediate complications. Estimated Blood Loss:     Estimated blood loss: none. Procedure:                Pre-Anesthesia Assessment:                           - Prior to the procedure, a History and Physical                            was performed, and patient medications and                            allergies were reviewed. The patient's tolerance of                            previous anesthesia was also reviewed. The risks                            and benefits of the procedure and the sedation                            options and risks were discussed with the patient.                            All questions were answered, and informed consent                            was obtained. Prior Anticoagulants: The patient has                            taken no previous anticoagulant or antiplatelet                            agents. ASA Grade Assessment: III - A patient with                            severe systemic disease. After reviewing the risks  and benefits, the patient was deemed in                            satisfactory condition to undergo the procedure.                           After obtaining informed consent, the endoscope was                            passed under direct vision. Throughout the                            procedure, the patient's blood  pressure, pulse, and                            oxygen saturations were monitored continuously. The                            GIF-H190 ([id number] Olympus gastroscope was                            introduced through the mouth, and advanced to the                            second part of duodenum. The upper GI endoscopy was                            accomplished without difficulty. The patient                            tolerated the procedure. Scope In: Scope Out: Findings:      The examined esophagus was normal.      The Z-line was regular and was found 40 cm from the incisors.      A large hiatal hernia was present.      Segmental mild inflammation characterized by congestion (edema) and       erythema was found in the gastric antrum.      The examined duodenum was normal. Impression:               - Normal esophagus.                           - Z-line regular, 40 cm from the incisors.                           - Large hiatal hernia.                           - Acute gastritis.                           - Normal examined duodenum.                           - No specimens collected. Moderate Sedation:      Not Applicable - Patient  had care per Anesthesia. Recommendation:           - Advance diet as tolerated and clear liquid diet.                           - Observe patient's clinical course. Procedure Code(s):        --- Professional ---                           [phone], Esophagogastroduodenoscopy, flexible,                            transoral; diagnostic, including collection of                            specimen(s) by brushing or washing, when performed                            (separate procedure) Diagnosis Code(s):        --- Professional ---                           K92.0, Hematemesis                           K29.00, Acute gastritis without bleeding                           K44.9, Diaphragmatic hernia without obstruction or                            gangrene CPT  copyright 2019 [employer]. All rights reserved. The codes documented in this report are preliminary and upon coder review may  be revised to meet current compliance requirements. Lear Ng, MD 02/22/2021 9:50:47 AM This report has been signed electronically. Number of Addenda: 0

## 2021-02-22 NOTE — Progress Notes (Signed)
Patient ID: [name], female   DOB: July 22, 1961, 60 y.o.   MRN: [id number]  PROGRESS NOTE    LAVERGNE HILTUNEN  RKY:[id number] DOB: 1960/11/04 DOA: 02/20/2021 PCP: Neale Burly, MD   Brief Narrative:  60 year old female with history of COPD not on oxygen, former smoker, anxiety, chronic pain on daily Vicodin x4, 1 episode of pneumonia associated paroxysmal A. fib not on anticoagulation, IBS presented with hematemesis/coffee-ground emesis.  She was initially found to have hemoglobin of 7.1, from baseline of 9 and was advised for admission but left AMA.  She returned 2 days later to [facility] with hemoglobin of 6.9 and still vomiting and was subsequently transferred to [facility].  GI was consulted.  She was transfused 2 units packed red cells.  She was started on IV Protonix  Assessment & Plan:   Acute blood loss anemia Acute GI bleeding -Presented with hematemesis/coffee-ground emesis and hemoglobin of 6.9.  Status post 2 units packed red cells transfusion during the hospitalization.  Currently on IV Protonix twice a day.  Hemoglobin 8.7 today.  Monitor H&H. -GI following and planning for EGD today.  Continue Carafate.  COPD -Stable.  Continue as needed albuterol  Chronic pain syndrome  -Continue home regimen of Vicodin/gabapentin/duloxetine  Depression/anxiety/PTSD -Continue home Xanax, olanzapine, prazosin, mirtazapine, trazodone    DVT prophylaxis: SCDs Code Status: Full Family Communication: None at bedside Disposition Plan: Status is: Inpatient  Remains inpatient appropriate because:Inpatient level of care appropriate due to severity of illness   Dispo: The patient is from: Home              Anticipated d/c is to: Home              Patient currently is not medically stable to d/c.   Difficult to place patient No   Consultants: GI  Procedures: None  Antimicrobials: None   Subjective: Patient seen and examined at bedside.  Denies overnight  fever, vomiting, black or bloody stools.  Objective: Vitals:   02/22/21 1010 02/22/21 1015 02/22/21 1020 02/22/21 1038  BP: (!) 103/57 (!) 103/48 (!) 109/59 115/74  Pulse: 82 70 84 73  Resp: (!) 22 12 16 14   Temp:    97.8 F (36.6 C)  TempSrc:    Oral  SpO2: 100% 97% 96% 100%  Weight:      Height:        Intake/Output Summary (Last 24 hours) at 02/22/2021 1126 Last data filed at 02/22/2021 0600 Gross per 24 hour  Intake 1760 ml  Output 0 ml  Net 1760 ml   Filed Weights   02/22/21 0853  Weight: 68 kg    Examination:  General exam: Appears calm and comfortable.  Currently on room air. Respiratory system: Bilateral decreased breath sounds at bases Cardiovascular system: S1 & S2 heard, Rate controlled Gastrointestinal system: Abdomen is nondistended, soft and nontender. Normal bowel sounds heard. Extremities: No cyanosis, clubbing, edema  Central nervous system: Alert and oriented. No focal neurological deficits. Moving extremities Skin: No rashes, lesions or ulcers Psychiatry: Flat affect.  Slow to respond.    Data Reviewed: I have personally reviewed following labs and imaging studies  CBC: Recent Labs  Lab 02/20/21 2310 02/21/21 1005 02/22/21 0454  WBC 6.1 6.9 5.9  HGB 6.2* 8.7* 8.7*  HCT 23.6* 30.1* 30.3*  MCV 72.8* 75.3* 74.8*  PLT 125* 464* 315*   Basic Metabolic Panel: Recent Labs  Lab 02/20/21 2310 02/21/21 1005 02/22/21 0454  NA 134* 138  141  K 5.9* 4.0 3.8  CL 102 105 104  CO2 25 27 33*  GLUCOSE 99 101* 115*  BUN 12 12 18   CREATININE 0.64 0.52 0.43*  CALCIUM 8.6* 8.8* 8.9   GFR: Estimated Creatinine Clearance: 68.3 mL/min (A) (by C-G formula based on SCr of 0.43 mg/dL (L)). Liver Function Tests: No results for input(s): AST, ALT, ALKPHOS, BILITOT, PROT, ALBUMIN in the last 168 hours. No results for input(s): LIPASE, AMYLASE in the last 168 hours. No results for input(s): AMMONIA in the last 168 hours. Coagulation Profile: No results for  input(s): INR, PROTIME in the last 168 hours. Cardiac Enzymes: No results for input(s): CKTOTAL, CKMB, CKMBINDEX, TROPONINI in the last 168 hours. BNP (last 3 results) No results for input(s): PROBNP in the last 8760 hours. HbA1C: No results for input(s): HGBA1C in the last 72 hours. CBG: No results for input(s): GLUCAP in the last 168 hours. Lipid Profile: No results for input(s): CHOL, HDL, LDLCALC, TRIG, CHOLHDL, LDLDIRECT in the last 72 hours. Thyroid Function Tests: No results for input(s): TSH, T4TOTAL, FREET4, T3FREE, THYROIDAB in the last 72 hours. Anemia Panel: Recent Labs    02/20/21 2310  VITAMINB12 180  FOLATE 62.1  FERRITIN 6*  TIBC 561*  IRON 84   Sepsis Labs: No results for input(s): PROCALCITON, LATICACIDVEN in the last 168 hours.  No results found for this or any previous visit (from the past 240 hour(s)).       Radiology Studies: No results found.      Scheduled Meds: . sodium chloride   Intravenous Once  . ALPRAZolam  1.5 mg Oral Daily  . DULoxetine  120 mg Oral Daily  . gabapentin  300 mg Oral Daily  . gabapentin  600 mg Oral QHS  . OLANZapine  15 mg Oral QHS  . pantoprazole (PROTONIX) IV  40 mg Intravenous Q12H  . prazosin  5 mg Oral QHS  . sodium chloride flush  3 mL Intravenous Q12H  . sucralfate  1 g Oral TID WC & HS   Continuous Infusions: . promethazine (PHENERGAN) injection (IM or IVPB)            Aline August, MD [facility] 02/22/2021, 11:26 AM

## 2021-02-22 NOTE — Brief Op Note (Signed)
  Large hiatal hernia and minimal gastritis otherwise normal EGD. See endopro notes for details. Clear liquid diet and advance. Follow H/Hs.

## 2021-02-22 NOTE — Discharge Instructions (Signed)
YOU HAD AN ENDOSCOPIC PROCEDURE TODAY: Refer to the procedure report and other information in the discharge instructions given to you for any specific questions about what was found during the examination. If this information does not answer your questions, please call [facility] office at [phone] to clarify.  ° °YOU SHOULD EXPECT: Some feelings of bloating in the abdomen. Passage of more gas than usual. Walking can help get rid of the air that was put into your GI tract during the procedure and reduce the bloating.  ° °DIET: Your first meal following the procedure should be a light meal and then it is ok to progress to your normal diet. A half-sandwich or bowl of soup is an example of a good first meal. Heavy or fried foods are harder to digest and may make you feel nauseous or bloated. Drink plenty of fluids but you should avoid alcoholic beverages for 24 hours. ° °ACTIVITY: Your care partner should take you home directly after the procedure. You should plan to take it easy, moving slowly for the rest of the day. You can resume normal activity the day after the procedure however YOU SHOULD NOT DRIVE, use power tools, machinery or perform tasks that involve climbing or major physical exertion for 24 hours (because of the sedation medicines used during the test).  ° °SYMPTOMS TO REPORT IMMEDIATELY: °A gastroenterologist can be reached at any hour. Please call [phone]  for any of the following symptoms:  ° °Following upper endoscopy (EGD, EUS, ERCP, esophageal dilation) °Vomiting of blood or coffee ground material  °New, significant abdominal pain  °New, significant chest pain or pain under the shoulder blades  °Painful or persistently difficult swallowing  °New shortness of breath  °Black, tarry-looking or red, bloody stools ° °FOLLOW UP:  °If any biopsies were taken you will be contacted by phone or by letter within the next 1-3 weeks. Call [phone]  if you have not heard about the biopsies in 3 weeks.    °Please also call with any specific questions about appointments or follow up tests. ° °

## 2021-02-23 ENCOUNTER — Encounter (HOSPITAL_COMMUNITY): Payer: Self-pay | Admitting: Gastroenterology

## 2021-02-23 DIAGNOSIS — F418 Other specified anxiety disorders: Secondary | ICD-10-CM | POA: Diagnosis not present

## 2021-02-23 DIAGNOSIS — J439 Emphysema, unspecified: Secondary | ICD-10-CM | POA: Diagnosis not present

## 2021-02-23 DIAGNOSIS — K922 Gastrointestinal hemorrhage, unspecified: Secondary | ICD-10-CM | POA: Diagnosis not present

## 2021-02-23 LAB — CBC WITH DIFFERENTIAL/PLATELET
Abs Immature Granulocytes: 0.04 10*3/uL (ref 0.00–0.07)
Basophils Absolute: 0 10*3/uL (ref 0.0–0.1)
Basophils Relative: 1 %
Eosinophils Absolute: 0.4 10*3/uL (ref 0.0–0.5)
Eosinophils Relative: 8 %
HCT: 30.9 % — ABNORMAL LOW (ref 36.0–46.0)
Hemoglobin: 8.7 g/dL — ABNORMAL LOW (ref 12.0–15.0)
Immature Granulocytes: 1 %
Lymphocytes Relative: 29 %
Lymphs Abs: 1.6 10*3/uL (ref 0.7–4.0)
MCH: 21.4 pg — ABNORMAL LOW (ref 26.0–34.0)
MCHC: 28.2 g/dL — ABNORMAL LOW (ref 30.0–36.0)
MCV: 75.9 fL — ABNORMAL LOW (ref 80.0–100.0)
Monocytes Absolute: 0.7 10*3/uL (ref 0.1–1.0)
Monocytes Relative: 13 %
Neutro Abs: 2.6 10*3/uL (ref 1.7–7.7)
Neutrophils Relative %: 48 %
Platelets: 291 10*3/uL (ref 150–400)
RBC: 4.07 MIL/uL (ref 3.87–5.11)
RDW: 22.5 % — ABNORMAL HIGH (ref 11.5–15.5)
WBC: 5.3 10*3/uL (ref 4.0–10.5)
nRBC: 0 % (ref 0.0–0.2)

## 2021-02-23 LAB — BASIC METABOLIC PANEL
Anion gap: 5 (ref 5–15)
BUN: 8 mg/dL (ref 8–23)
CO2: 31 mmol/L (ref 22–32)
Calcium: 8.6 mg/dL — ABNORMAL LOW (ref 8.9–10.3)
Chloride: 105 mmol/L (ref 98–111)
Creatinine, Ser: 0.41 mg/dL — ABNORMAL LOW (ref 0.44–1.00)
GFR, Estimated: >60 mL/min (ref >60)
Glucose, Bld: 84 mg/dL (ref 70–99)
Potassium: 4 mmol/L (ref 3.5–5.1)
Sodium: 141 mmol/L (ref 135–145)

## 2021-02-23 LAB — MAGNESIUM: Magnesium: 2.3 mg/dL (ref 1.7–2.4)

## 2021-02-23 MED ORDER — PANTOPRAZOLE SODIUM 40 MG PO TBEC: 40.0000 mg | DELAYED_RELEASE_TABLET | Freq: Two times a day (BID) | ORAL | 1 refills | Status: DC

## 2021-02-23 MED ORDER — POLYETHYLENE GLYCOL 3350 17 G PO PACK: 17.0000 g | PACK | Freq: Every day | ORAL | Status: DC | PRN

## 2021-02-23 MED ORDER — ONDANSETRON HCL 4 MG PO TABS: 4.0000 mg | ORAL_TABLET | Freq: Four times a day (QID) | ORAL | 0 refills | Status: DC | PRN

## 2021-02-23 MED ORDER — PANTOPRAZOLE SODIUM 40 MG PO TBEC
40.0000 mg | DELAYED_RELEASE_TABLET | Freq: Two times a day (BID) | ORAL | Status: DC
  Administered 2021-02-23: 40 mg via ORAL
  Filled 2021-02-23: qty 1

## 2021-02-23 MED ORDER — ALBUTEROL SULFATE (2.5 MG/3ML) 0.083% IN NEBU: 2.5000 mg | INHALATION_SOLUTION | Freq: Two times a day (BID) | RESPIRATORY_TRACT | Status: DC

## 2021-02-23 NOTE — Progress Notes (Signed)
Went over discharge instructions w/ pt. Pt verbalized understanding.  

## 2021-02-23 NOTE — Discharge Summary (Signed)
Physician Discharge Summary  [name] VWU:[id number] DOB: 08/19/1961 DOA: 02/20/2021  PCP: Neale Burly, MD  Admit date: 02/20/2021 Discharge date: 02/23/2021  Admitted From: Home Disposition: Home  Recommendations for Outpatient Follow-up:  1. Follow up with PCP in 1 week with repeat CBC/BMP 2. Outpatient follow-up with GI 3. Follow up in ED if symptoms worsen or new appear   Home Health: No Equipment/Devices: None  Discharge Condition: Stable CODE STATUS: Full Diet recommendation: Heart healthy  Brief/Interim Summary: 60 year old female with history of COPD not on oxygen, former smoker, anxiety, chronic pain on daily Vicodin x4, 1 episode of pneumonia associated paroxysmal A. fib not on anticoagulation, IBS presented with hematemesis/coffee-ground emesis.  She was initially found to have hemoglobin of 7.1, from baseline of 9 and was advised for admission but left AMA.  She returned 2 days later to [facility] with hemoglobin of 6.9 and still vomiting and was subsequently transferred to [facility].  GI was consulted.  She was transfused 2 units packed red cells.  She was started on IV Protonix.  She had EGD on 02/22/2021 which showed large hiatal hernia and acute gastritis.  GI recommends Protonix 40 twice a day for 6 weeks then once a day and has cleared her for discharge.  Her hemoglobin has remained stable.  Her diet will be advanced today and if she tolerates diet today, she will be discharged home with outpatient follow-up with PCP and GI.  Discharge Diagnoses:   Acute blood loss anemia Acute GI bleeding -Presented with hematemesis/coffee-ground emesis and hemoglobin of 6.9.  Status post 2 units packed red cells transfusion during the hospitalization.    Treated with IV Protonix twice a day.  Hemoglobin 8.7 today.  Monitor H&H. -She had EGD on 02/22/2021 which showed large hiatal hernia and acute gastritis.  GI recommends Protonix 40 twice a day for 6 weeks then  once a day and has cleared her for discharge.  Her hemoglobin has remained stable.  Her diet will be advanced today and if she tolerates diet today, she will be discharged home with outpatient follow-up with PCP and GI with repeat CBC.  Carafate has been discontinued by GI.  COPD -Stable.  Continue as needed albuterol  Chronic pain syndrome  -Continue home regimen of Vicodin/gabapentin/duloxetine  Depression/anxiety/PTSD -Continue home Xanax, olanzapine, prazosin, mirtazapine, trazodone.  Outpatient follow-up with PCP/psychiatry  Discharge Instructions  Discharge Instructions    Diet - low sodium heart healthy   Complete by: As directed    Increase activity slowly   Complete by: As directed      Allergies as of 02/23/2021      Reactions   Penicillins Anaphylaxis, Other (See Comments)   Childhood allergy  Has patient had a PCN reaction causing immediate rash, facial/tongue/throat swelling, SOB or lightheadedness with hypotension: Yes Has patient had a PCN reaction causing severe rash involving mucus membranes or skin necrosis: No Has patient had a PCN reaction that required hospitalization Yes Has patient had a PCN reaction occurring within the last 10 years: No If all of the above answers are "NO", then may proceed with Cephalosporin use.   Clarithromycin Nausea And Vomiting   Moxifloxacin Nausea And Vomiting   Quinolones Nausea And Vomiting   Salicylates Nausea And Vomiting   Lactose Intolerance (gi) Diarrhea   Lactulose Diarrhea   Aspirin Other (See Comments)   Burning of stomach. REACTION: GI upset   Buprenorphine Hcl Itching, Other (See Comments)   Suboxone   Ibuprofen  Nausea Only, Other (See Comments)   Severe heartburn, Upset stomach due to acid reflux   Morphine And Related Itching   Sulfa Antibiotics Itching      Medication List    STOP taking these medications   famotidine 20 MG tablet Commonly known as: PEPCID   omeprazole 20 MG capsule Commonly known  as: PRILOSEC     TAKE these medications   albuterol (2.5 MG/3ML) 0.083% nebulizer solution Commonly known as: PROVENTIL Take 2.5 mg by nebulization every 6 (six) hours as needed for wheezing or shortness of breath.   albuterol 90 MCG/ACT inhaler Commonly known as: PROVENTIL,VENTOLIN Inhale 2 puffs into the lungs every 6 (six) hours as needed for wheezing or shortness of breath. For shortness of breath   ALPRAZolam 1 MG tablet Commonly known as: XANAX Take 1.5 mg by mouth daily.   DULoxetine 60 MG capsule Commonly known as: CYMBALTA Take 120 mg by mouth daily.   gabapentin 300 MG capsule Commonly known as: NEURONTIN Take 300-600 mg by mouth See admin instructions. Take 300 mg in the AM and 600 mg at bedtime   HYDROcodone-acetaminophen 10-325 MG tablet Commonly known as: NORCO Take 1 tablet by mouth 4 (four) times daily as needed for moderate pain.   mirtazapine 15 MG tablet Commonly known as: REMERON Take 15 mg by mouth at bedtime as needed (for rest).   OLANZapine 15 MG tablet Commonly known as: ZYPREXA Take 15 mg by mouth at bedtime.   ondansetron 4 MG tablet Commonly known as: ZOFRAN Take 1 tablet (4 mg total) by mouth every 6 (six) hours as needed for nausea.   pantoprazole 40 MG tablet Commonly known as: PROTONIX Take 1 tablet (40 mg total) by mouth 2 (two) times daily. 40mg  BID for 6 weeks then once a day as per GI/Dr. Michail Sermon What changed:   medication strength  how much to take  when to take this  additional instructions   prazosin 5 MG capsule Commonly known as: MINIPRESS Take 5 mg by mouth at bedtime.   promethazine 25 MG tablet Commonly known as: PHENERGAN Take 12.5 mg by mouth every 6 (six) hours as needed for nausea or vomiting.   promethazine 25 MG suppository Commonly known as: PHENERGAN Place 25 mg rectally every 6 (six) hours as needed for nausea or vomiting.   propranolol 10 MG tablet Commonly known as: INDERAL Take 10 mg by mouth 2  (two) times daily.   tiZANidine 2 MG tablet Commonly known as: ZANAFLEX Take 2 mg by mouth 2 (two) times daily as needed for muscle spasms.   traZODone 50 MG tablet Commonly known as: DESYREL Take 50 mg by mouth at bedtime as needed for sleep.       Follow-up Information    Neale Burly, MD. Schedule an appointment as soon as possible for a visit in 1 week(s).   Specialty: Internal Medicine Why: with repeat cbc/bmp Contact information: [place] [address] 347 [phone]        Wilford Corner, MD. Schedule an appointment as soon as possible for a visit in 1 week(s).   Specialty: Gastroenterology Contact information: [address] [place] [address] [phone]              Allergies  Allergen Reactions  . Penicillins Anaphylaxis and Other (See Comments)    Childhood allergy  Has patient had a PCN reaction causing immediate rash, facial/tongue/throat swelling, SOB or lightheadedness with hypotension: Yes Has patient  had a PCN reaction causing severe rash involving mucus membranes or skin necrosis: No Has patient had a PCN reaction that required hospitalization Yes Has patient had a PCN reaction occurring within the last 10 years: No If all of the above answers are "NO", then may proceed with Cephalosporin use.   . Clarithromycin Nausea And Vomiting  . Moxifloxacin Nausea And Vomiting  . Quinolones Nausea And Vomiting  . Salicylates Nausea And Vomiting  . Lactose Intolerance (Gi) Diarrhea  . Lactulose Diarrhea  . Aspirin Other (See Comments)    Burning of stomach. REACTION: GI upset  . Buprenorphine Hcl Itching and Other (See Comments)    Suboxone  . Ibuprofen Nausea Only and Other (See Comments)    Severe heartburn, Upset stomach due to acid reflux  . Morphine And Related Itching  . Sulfa Antibiotics Itching    Consultations:  GI   Procedures/Studies:  EGD on 02/22/2021 Impression:               - Normal  esophagus.                           - Z-line regular, 40 cm from the incisors.                           - Large hiatal hernia.                           - Acute gastritis.                           - Normal examined duodenum.                           - No specimens collected. Moderate Sedation:      Not Applicable - Patient had care per Anesthesia. Recommendation:           - Advance diet as tolerated and clear liquid diet.                           - Observe patient's clinical course.  Subjective: Patient seen and examined at bedside.  Denies worsening shortness of breath, abdominal pain, nausea or vomiting.  Discharge Exam: Vitals:   02/23/21 0514 02/23/21 0925  BP: 110/67   Pulse: (!) 58   Resp: 20   Temp: 97.6 F (36.4 C)   SpO2: 100% 94%    General: Pt is alert, awake, not in acute distress.  Looks chronically ill.  Currently on room air.  Poor historian. Cardiovascular: rate controlled, S1/S2 + Respiratory: bilateral decreased breath sounds at bases Abdominal: Soft, NT, ND, bowel sounds + Extremities: no edema, no cyanosis    The results of significant diagnostics from this hospitalization (including imaging, microbiology, ancillary and laboratory) are listed below for reference.     Microbiology: No results found for this or any previous visit (from the past 240 hour(s)).   Labs: BNP (last 3 results) No results for input(s): BNP in the last 8760 hours. Basic Metabolic Panel: Recent Labs  Lab 02/20/21 2310 02/21/21 1005 02/22/21 0454 02/23/21 0511  NA 134* 138 141 141  K 5.9* 4.0 3.8 4.0  CL 102 105 104 105  CO2 25 27 33* 31  GLUCOSE 99 101* 115* 84  BUN 12 12 18 8   CREATININE 0.64 0.52 0.43* 0.41*  CALCIUM 8.6* 8.8* 8.9 8.6*  MG  --   --   --  2.3   Liver Function Tests: No results for input(s): AST, ALT, ALKPHOS, BILITOT, PROT, ALBUMIN in the last 168 hours. No results for input(s): LIPASE, AMYLASE in the last 168 hours. No results for  input(s): AMMONIA in the last 168 hours. CBC: Recent Labs  Lab 02/20/21 2310 02/21/21 1005 02/22/21 0454 02/23/21 0511  WBC 6.1 6.9 5.9 5.3  NEUTROABS  --   --   --  2.6  HGB 6.2* 8.7* 8.7* 8.7*  HCT 23.6* 30.1* 30.3* 30.9*  MCV 72.8* 75.3* 74.8* 75.9*  PLT 125* 464* 415* 291   Cardiac Enzymes: No results for input(s): CKTOTAL, CKMB, CKMBINDEX, TROPONINI in the last 168 hours. BNP: Invalid input(s): POCBNP CBG: No results for input(s): GLUCAP in the last 168 hours. D-Dimer No results for input(s): DDIMER in the last 72 hours. Hgb A1c No results for input(s): HGBA1C in the last 72 hours. Lipid Profile No results for input(s): CHOL, HDL, LDLCALC, TRIG, CHOLHDL, LDLDIRECT in the last 72 hours. Thyroid function studies No results for input(s): TSH, T4TOTAL, T3FREE, THYROIDAB in the last 72 hours.  Invalid input(s): FREET3 Anemia work up Recent Labs    02/20/21 2310  VITAMINB12 180  FOLATE 62.1  FERRITIN 6*  TIBC 561*  IRON 84   Urinalysis    Component Value Date/Time   COLORURINE STRAW (A) 02/01/2020 1337   APPEARANCEUR CLEAR 02/01/2020 1337   LABSPEC 1.025 02/01/2020 1337   PHURINE 5.0 02/01/2020 1337   GLUCOSEU NEGATIVE 02/01/2020 1337   GLUCOSEU NEG mg/dL 10/21/2009 1550   HGBUR NEGATIVE 02/01/2020 1337   [place] 02/01/2020 1337   KETONESUR NEGATIVE 02/01/2020 1337   PROTEINUR NEGATIVE 02/01/2020 1337   UROBILINOGEN 0.2 12/07/2014 1811   NITRITE NEGATIVE 02/01/2020 1337   LEUKOCYTESUR NEGATIVE 02/01/2020 1337   Sepsis Labs Invalid input(s): PROCALCITONIN,  WBC,  LACTICIDVEN Microbiology No results found for this or any previous visit (from the past 240 hour(s)).   Time coordinating discharge: 35 minutes  SIGNED:   Aline August, MD  [facility] 02/23/2021, 10:56 AM

## 2021-02-23 NOTE — Progress Notes (Signed)
[facility] Gastroenterology Progress Note  [name] 60 y.o. 26-Mar-1961   Subjective: Complaining of LLQ pain that she says has occurred for several days. Denies heartburn or vomiting.  Objective: Vital signs: Vitals:   02/23/21 0321 02/23/21 0514  BP:  110/67  Pulse:  (!) 58  Resp:  20  Temp:  97.6 F (36.4 C)  SpO2: 98% 100%    Physical Exam: Gen: alert, no acute distress  HEENT: anicteric sclera CV: RRR Chest: CTA B Abd: LLQ and midline tenderness with guarding, soft, nondistended, +BS, midline surgical scar Ext: no edema  Lab Results: Recent Labs    02/22/21 0454 02/23/21 0511  NA 141 141  K 3.8 4.0  CL 104 105  CO2 33* 31  GLUCOSE 115* 84  BUN 18 8  CREATININE 0.43* 0.41*  CALCIUM 8.9 8.6*  MG  --  2.3   No results for input(s): AST, ALT, ALKPHOS, BILITOT, PROT, ALBUMIN in the last 72 hours. Recent Labs    02/22/21 0454 02/23/21 0511  WBC 5.9 5.3  NEUTROABS  --  2.6  HGB 8.7* 8.7*  HCT 30.3* 30.9*  MCV 74.8* 75.9*  PLT 415* 291      Assessment/Plan: Melena likely due to large hiatal hernia and gastritis. Hgb stable. Manage conservatively. If anemia worsens then repeat colonoscopy. Suspect abdominal pain due to adhesions and constipation. Stop Carafate. Advance diet. Miralax prn. Will sign off. Call us back if questions. If no primary GI doctor that she sees, then F/U with me in 4-6 weeks at [facility].   Lear Ng 02/23/2021, 9:19 AM  Questions please call [phone] ID: Scot Dock, female   DOB: 12/12/1960, 60 y.o.   MRN: [id number]

## 2021-02-23 NOTE — Progress Notes (Addendum)
Patient ate breakfast and lunch.She tolerated both well. Patient denies nausea and vomiting. Secure chat sent to Dr. Starla Link to make sure it is ok to discharge patient and he stated that she was ok to discharge.

## 2021-04-05 ENCOUNTER — Encounter: Payer: Self-pay | Admitting: Internal Medicine

## 2021-06-08 ENCOUNTER — Encounter: Payer: Self-pay | Admitting: Internal Medicine

## 2021-06-10 ENCOUNTER — Encounter (HOSPITAL_COMMUNITY): Payer: Self-pay | Admitting: Emergency Medicine

## 2021-06-10 ENCOUNTER — Inpatient Hospital Stay (HOSPITAL_COMMUNITY)
Admission: EM | Admit: 2021-06-10 | Discharge: 2021-06-12 | DRG: 378 | Disposition: A | Discharge status: Home / Self Care | Payer: Medicare Other | Attending: Family Medicine | Admitting: Family Medicine

## 2021-06-10 ENCOUNTER — Inpatient Hospital Stay (HOSPITAL_COMMUNITY): Payer: Medicare Other | Admitting: Anesthesiology

## 2021-06-10 ENCOUNTER — Other Ambulatory Visit: Payer: Self-pay

## 2021-06-10 ENCOUNTER — Inpatient Hospital Stay (HOSPITAL_COMMUNITY): Payer: Medicare Other

## 2021-06-10 ENCOUNTER — Encounter (HOSPITAL_COMMUNITY)
Admission: EM | Disposition: A | Discharge status: Home / Self Care | Payer: Self-pay | Source: Home / Self Care | Attending: Family Medicine

## 2021-06-10 ENCOUNTER — Emergency Department (HOSPITAL_COMMUNITY): Payer: Medicare Other

## 2021-06-10 DIAGNOSIS — R0602 Shortness of breath: Secondary | ICD-10-CM

## 2021-06-10 DIAGNOSIS — K219 Gastro-esophageal reflux disease without esophagitis: Secondary | ICD-10-CM | POA: Diagnosis not present

## 2021-06-10 DIAGNOSIS — G894 Chronic pain syndrome: Secondary | ICD-10-CM | POA: Diagnosis present

## 2021-06-10 DIAGNOSIS — K921 Melena: Secondary | ICD-10-CM | POA: Diagnosis present

## 2021-06-10 DIAGNOSIS — K3189 Other diseases of stomach and duodenum: Secondary | ICD-10-CM

## 2021-06-10 DIAGNOSIS — Z880 Allergy status to penicillin: Secondary | ICD-10-CM

## 2021-06-10 DIAGNOSIS — K920 Hematemesis: Secondary | ICD-10-CM | POA: Diagnosis not present

## 2021-06-10 DIAGNOSIS — K319 Disease of stomach and duodenum, unspecified: Secondary | ICD-10-CM | POA: Diagnosis present

## 2021-06-10 DIAGNOSIS — F17210 Nicotine dependence, cigarettes, uncomplicated: Secondary | ICD-10-CM | POA: Diagnosis present

## 2021-06-10 DIAGNOSIS — K254 Chronic or unspecified gastric ulcer with hemorrhage: Principal | ICD-10-CM | POA: Diagnosis present

## 2021-06-10 DIAGNOSIS — K449 Diaphragmatic hernia without obstruction or gangrene: Secondary | ICD-10-CM | POA: Diagnosis present

## 2021-06-10 DIAGNOSIS — I959 Hypotension, unspecified: Secondary | ICD-10-CM | POA: Diagnosis present

## 2021-06-10 DIAGNOSIS — Z96651 Presence of right artificial knee joint: Secondary | ICD-10-CM | POA: Diagnosis present

## 2021-06-10 DIAGNOSIS — J45909 Unspecified asthma, uncomplicated: Secondary | ICD-10-CM | POA: Diagnosis present

## 2021-06-10 DIAGNOSIS — Z9981 Dependence on supplemental oxygen: Secondary | ICD-10-CM

## 2021-06-10 DIAGNOSIS — D509 Iron deficiency anemia, unspecified: Secondary | ICD-10-CM | POA: Diagnosis not present

## 2021-06-10 DIAGNOSIS — F32A Depression, unspecified: Secondary | ICD-10-CM | POA: Diagnosis not present

## 2021-06-10 DIAGNOSIS — J439 Emphysema, unspecified: Secondary | ICD-10-CM

## 2021-06-10 DIAGNOSIS — Z91199 Patient's noncompliance with other medical treatment and regimen due to unspecified reason: Secondary | ICD-10-CM

## 2021-06-10 DIAGNOSIS — R110 Nausea: Secondary | ICD-10-CM | POA: Diagnosis present

## 2021-06-10 DIAGNOSIS — Z86010 Personal history of colonic polyps: Secondary | ICD-10-CM

## 2021-06-10 DIAGNOSIS — Z90711 Acquired absence of uterus with remaining cervical stump: Secondary | ICD-10-CM

## 2021-06-10 DIAGNOSIS — Z8616 Personal history of COVID-19: Secondary | ICD-10-CM | POA: Diagnosis not present

## 2021-06-10 DIAGNOSIS — F4310 Post-traumatic stress disorder, unspecified: Secondary | ICD-10-CM | POA: Diagnosis not present

## 2021-06-10 DIAGNOSIS — K259 Gastric ulcer, unspecified as acute or chronic, without hemorrhage or perforation: Secondary | ICD-10-CM

## 2021-06-10 DIAGNOSIS — J449 Chronic obstructive pulmonary disease, unspecified: Secondary | ICD-10-CM | POA: Diagnosis not present

## 2021-06-10 DIAGNOSIS — K589 Irritable bowel syndrome without diarrhea: Secondary | ICD-10-CM | POA: Diagnosis present

## 2021-06-10 DIAGNOSIS — K2101 Gastro-esophageal reflux disease with esophagitis, with bleeding: Secondary | ICD-10-CM

## 2021-06-10 DIAGNOSIS — K922 Gastrointestinal hemorrhage, unspecified: Secondary | ICD-10-CM | POA: Diagnosis not present

## 2021-06-10 DIAGNOSIS — Z720 Tobacco use: Secondary | ICD-10-CM | POA: Diagnosis present

## 2021-06-10 DIAGNOSIS — E739 Lactose intolerance, unspecified: Secondary | ICD-10-CM | POA: Diagnosis not present

## 2021-06-10 DIAGNOSIS — Z79899 Other long term (current) drug therapy: Secondary | ICD-10-CM

## 2021-06-10 DIAGNOSIS — K279 Peptic ulcer, site unspecified, unspecified as acute or chronic, without hemorrhage or perforation: Secondary | ICD-10-CM | POA: Diagnosis present

## 2021-06-10 DIAGNOSIS — D62 Acute posthemorrhagic anemia: Secondary | ICD-10-CM | POA: Diagnosis present

## 2021-06-10 DIAGNOSIS — F418 Other specified anxiety disorders: Secondary | ICD-10-CM | POA: Diagnosis present

## 2021-06-10 DIAGNOSIS — Z9119 Patient's noncompliance with other medical treatment and regimen: Secondary | ICD-10-CM

## 2021-06-10 DIAGNOSIS — M542 Cervicalgia: Secondary | ICD-10-CM | POA: Diagnosis present

## 2021-06-10 HISTORY — PX: ESOPHAGOGASTRODUODENOSCOPY (EGD) WITH PROPOFOL: SHX5813

## 2021-06-10 HISTORY — PX: BIOPSY: SHX5522

## 2021-06-10 LAB — CBC WITH DIFFERENTIAL/PLATELET
Abs Immature Granulocytes: 0.13 K/uL — ABNORMAL HIGH (ref 0.00–0.07)
Basophils Absolute: 0.1 K/uL (ref 0.0–0.1)
Basophils Relative: 1 %
Eosinophils Absolute: 0.3 K/uL (ref 0.0–0.5)
Eosinophils Relative: 3 %
HCT: 26.4 % — ABNORMAL LOW (ref 36.0–46.0)
Hemoglobin: 7.8 g/dL — ABNORMAL LOW (ref 12.0–15.0)
Immature Granulocytes: 1 %
Lymphocytes Relative: 12 %
Lymphs Abs: 1.5 K/uL (ref 0.7–4.0)
MCH: 24.1 pg — ABNORMAL LOW (ref 26.0–34.0)
MCHC: 29.5 g/dL — ABNORMAL LOW (ref 30.0–36.0)
MCV: 81.7 fL (ref 80.0–100.0)
Monocytes Absolute: 1.3 K/uL — ABNORMAL HIGH (ref 0.1–1.0)
Monocytes Relative: 11 %
Neutro Abs: 8.7 K/uL — ABNORMAL HIGH (ref 1.7–7.7)
Neutrophils Relative %: 72 %
Platelets: 374 K/uL (ref 150–400)
RBC: 3.23 MIL/uL — ABNORMAL LOW (ref 3.87–5.11)
RDW: 23.4 % — ABNORMAL HIGH (ref 11.5–15.5)
WBC: 12 K/uL — ABNORMAL HIGH (ref 4.0–10.5)
nRBC: 0 % (ref 0.0–0.2)

## 2021-06-10 LAB — COMPREHENSIVE METABOLIC PANEL
ALT: 21 U/L (ref 0–44)
AST: 16 U/L (ref 15–41)
Albumin: 2.9 g/dL — ABNORMAL LOW (ref 3.5–5.0)
Alkaline Phosphatase: 63 U/L (ref 38–126)
Anion gap: 3 — ABNORMAL LOW (ref 5–15)
BUN: 13 mg/dL (ref 8–23)
CO2: 27 mmol/L (ref 22–32)
Calcium: 8 mg/dL — ABNORMAL LOW (ref 8.9–10.3)
Chloride: 109 mmol/L (ref 98–111)
Creatinine, Ser: 0.44 mg/dL (ref 0.44–1.00)
GFR, Estimated: >60 mL/min (ref >60)
Glucose, Bld: 100 mg/dL — ABNORMAL HIGH (ref 70–99)
Potassium: 3.6 mmol/L (ref 3.5–5.1)
Sodium: 139 mmol/L (ref 135–145)
Total Bilirubin: 0.1 mg/dL — ABNORMAL LOW (ref 0.3–1.2)
Total Protein: 5.2 g/dL — ABNORMAL LOW (ref 6.5–8.1)

## 2021-06-10 LAB — LACTIC ACID, PLASMA
Lactic Acid, Venous: 1.4 mmol/L (ref 0.5–1.9)
Lactic Acid, Venous: 1.4 mmol/L (ref 0.5–1.9)

## 2021-06-10 LAB — RAPID URINE DRUG SCREEN, HOSP PERFORMED
Amphetamines: NOT DETECTED
Barbiturates: NOT DETECTED
Benzodiazepines: POSITIVE — AB
Cocaine: NOT DETECTED
Opiates: NOT DETECTED
Tetrahydrocannabinol: NOT DETECTED

## 2021-06-10 LAB — PREPARE RBC (CROSSMATCH)

## 2021-06-10 LAB — RESP PANEL BY RT-PCR (FLU A&B, COVID) ARPGX2
Influenza A by PCR: NEGATIVE
Influenza B by PCR: NEGATIVE
SARS Coronavirus 2 by RT PCR: NEGATIVE

## 2021-06-10 LAB — PROTIME-INR
INR: 0.9 (ref 0.8–1.2)
Prothrombin Time: 11.9 seconds (ref 11.4–15.2)

## 2021-06-10 LAB — HEMOGLOBIN AND HEMATOCRIT, BLOOD
HCT: 32.7 % — ABNORMAL LOW (ref 36.0–46.0)
Hemoglobin: 9.8 g/dL — ABNORMAL LOW (ref 12.0–15.0)

## 2021-06-10 LAB — MRSA NEXT GEN BY PCR, NASAL: MRSA by PCR Next Gen: NOT DETECTED

## 2021-06-10 LAB — POC OCCULT BLOOD, ED: Fecal Occult Bld: NEGATIVE

## 2021-06-10 SURGERY — ESOPHAGOGASTRODUODENOSCOPY (EGD) WITH PROPOFOL
Anesthesia: General

## 2021-06-10 MED ORDER — ASCORBIC ACID 500 MG PO TABS
500.0000 mg | ORAL_TABLET | Freq: Every day | ORAL | Status: DC
  Administered 2021-06-10 – 2021-06-12 (×3): 500 mg via ORAL
  Filled 2021-06-10 (×3): qty 1

## 2021-06-10 MED ORDER — SODIUM CHLORIDE 0.9 % IV SOLN: INTRAVENOUS | Status: DC

## 2021-06-10 MED ORDER — STERILE WATER FOR IRRIGATION IR SOLN
Status: DC | PRN
  Administered 2021-06-10: 200 mL

## 2021-06-10 MED ORDER — POLYETHYLENE GLYCOL 3350 17 G PO PACK
17.0000 g | PACK | Freq: Two times a day (BID) | ORAL | Status: DC
  Administered 2021-06-10 – 2021-06-11 (×2): 17 g via ORAL
  Filled 2021-06-10: qty 1

## 2021-06-10 MED ORDER — ZINC SULFATE 220 (50 ZN) MG PO CAPS
220.0000 mg | ORAL_CAPSULE | Freq: Every day | ORAL | Status: DC
  Administered 2021-06-11 – 2021-06-12 (×2): 220 mg via ORAL
  Filled 2021-06-10 (×2): qty 1

## 2021-06-10 MED ORDER — GABAPENTIN 400 MG PO CAPS
400.0000 mg | ORAL_CAPSULE | Freq: Three times a day (TID) | ORAL | Status: DC
  Administered 2021-06-10 – 2021-06-12 (×6): 400 mg via ORAL
  Filled 2021-06-10 (×6): qty 1

## 2021-06-10 MED ORDER — CHLORHEXIDINE GLUCONATE CLOTH 2 % EX PADS
6.0000 | MEDICATED_PAD | Freq: Every day | CUTANEOUS | Status: DC
  Administered 2021-06-10 – 2021-06-12 (×3): 6 via TOPICAL

## 2021-06-10 MED ORDER — ONDANSETRON HCL 4 MG/2ML IJ SOLN
4.0000 mg | Freq: Four times a day (QID) | INTRAMUSCULAR | Status: DC | PRN
  Administered 2021-06-10 – 2021-06-11 (×2): 4 mg via INTRAVENOUS
  Filled 2021-06-10 (×2): qty 2

## 2021-06-10 MED ORDER — NICOTINE 21 MG/24HR TD PT24
21.0000 mg | MEDICATED_PATCH | Freq: Every day | TRANSDERMAL | Status: DC
  Administered 2021-06-10 – 2021-06-12 (×3): 21 mg via TRANSDERMAL
  Filled 2021-06-10 (×3): qty 1

## 2021-06-10 MED ORDER — PANTOPRAZOLE 80MG IVPB - SIMPLE MED
80.0000 mg | Freq: Once | INTRAVENOUS | Status: AC
  Administered 2021-06-10: 80 mg via INTRAVENOUS
  Filled 2021-06-10: qty 100

## 2021-06-10 MED ORDER — METOCLOPRAMIDE HCL 5 MG/5ML PO SOLN: 10.0000 mg | Freq: Once | ORAL | Status: DC

## 2021-06-10 MED ORDER — DULOXETINE HCL 60 MG PO CPEP: 120.0000 mg | ORAL_CAPSULE | Freq: Every day | ORAL | Status: DC

## 2021-06-10 MED ORDER — FUROSEMIDE 10 MG/ML IJ SOLN
30.0000 mg | Freq: Once | INTRAMUSCULAR | Status: AC
  Administered 2021-06-10: 30 mg via INTRAVENOUS
  Filled 2021-06-10: qty 4

## 2021-06-10 MED ORDER — IPRATROPIUM-ALBUTEROL 0.5-2.5 (3) MG/3ML IN SOLN
3.0000 mL | RESPIRATORY_TRACT | Status: DC | PRN
  Administered 2021-06-10: 3 mL via RESPIRATORY_TRACT
  Filled 2021-06-10: qty 3

## 2021-06-10 MED ORDER — ONDANSETRON HCL 4 MG/2ML IJ SOLN
4.0000 mg | Freq: Once | INTRAMUSCULAR | Status: AC
  Administered 2021-06-10: 4 mg via INTRAVENOUS
  Filled 2021-06-10: qty 2

## 2021-06-10 MED ORDER — POTASSIUM CHLORIDE CRYS ER 20 MEQ PO TBCR
30.0000 meq | EXTENDED_RELEASE_TABLET | Freq: Every day | ORAL | Status: DC
  Administered 2021-06-10 – 2021-06-11 (×2): 30 meq via ORAL
  Filled 2021-06-10 (×2): qty 1

## 2021-06-10 MED ORDER — LIDOCAINE VISCOUS HCL 2 % MT SOLN
OROMUCOSAL | Status: AC
  Filled 2021-06-10: qty 15

## 2021-06-10 MED ORDER — HYDROMORPHONE HCL 1 MG/ML IJ SOLN
0.2500 mg | INTRAMUSCULAR | Status: DC | PRN
  Administered 2021-06-10 – 2021-06-11 (×4): 0.25 mg via INTRAVENOUS
  Filled 2021-06-10 (×4): qty 0.5

## 2021-06-10 MED ORDER — PANTOPRAZOLE SODIUM 40 MG PO TBEC
40.0000 mg | DELAYED_RELEASE_TABLET | Freq: Two times a day (BID) | ORAL | Status: DC
  Administered 2021-06-10 – 2021-06-12 (×5): 40 mg via ORAL
  Filled 2021-06-10 (×5): qty 1

## 2021-06-10 MED ORDER — FENTANYL CITRATE PF 50 MCG/ML IJ SOSY: 50.0000 ug | PREFILLED_SYRINGE | Freq: Once | INTRAMUSCULAR | Status: DC

## 2021-06-10 MED ORDER — IOHEXOL 350 MG/ML SOLN
80.0000 mL | Freq: Once | INTRAVENOUS | Status: AC | PRN
  Administered 2021-06-10: 80 mL via INTRAVENOUS

## 2021-06-10 MED ORDER — ALBUTEROL SULFATE (2.5 MG/3ML) 0.083% IN NEBU
2.5000 mg | INHALATION_SOLUTION | Freq: Four times a day (QID) | RESPIRATORY_TRACT | Status: DC | PRN
  Administered 2021-06-10: 2.5 mg via RESPIRATORY_TRACT
  Filled 2021-06-10: qty 3

## 2021-06-10 MED ORDER — LIDOCAINE HCL (CARDIAC) PF 100 MG/5ML IV SOSY
PREFILLED_SYRINGE | INTRAVENOUS | Status: DC | PRN
  Administered 2021-06-10: 50 mg via INTRAVENOUS

## 2021-06-10 MED ORDER — ACETAMINOPHEN 325 MG PO TABS
650.0000 mg | ORAL_TABLET | Freq: Four times a day (QID) | ORAL | Status: DC | PRN
  Filled 2021-06-10: qty 2

## 2021-06-10 MED ORDER — PROPOFOL 10 MG/ML IV BOLUS
INTRAVENOUS | Status: AC
  Filled 2021-06-10: qty 40

## 2021-06-10 MED ORDER — BUTALBITAL-APAP-CAFFEINE 50-325-40 MG PO TABS
1.0000 | ORAL_TABLET | Freq: Three times a day (TID) | ORAL | Status: DC | PRN
  Administered 2021-06-10 – 2021-06-11 (×3): 1 via ORAL
  Filled 2021-06-10 (×3): qty 1

## 2021-06-10 MED ORDER — OLANZAPINE 5 MG PO TABS
15.0000 mg | ORAL_TABLET | Freq: Every day | ORAL | Status: DC
  Administered 2021-06-10 – 2021-06-11 (×2): 15 mg via ORAL
  Filled 2021-06-10 (×2): qty 3

## 2021-06-10 MED ORDER — SODIUM CHLORIDE 0.9 % IV BOLUS
1000.0000 mL | Freq: Once | INTRAVENOUS | Status: AC
  Administered 2021-06-10: 1000 mL via INTRAVENOUS

## 2021-06-10 MED ORDER — PANTOPRAZOLE INFUSION (NEW) - SIMPLE MED
8.0000 mg/h | INTRAVENOUS | Status: DC
  Administered 2021-06-10: 8 mg/h via INTRAVENOUS
  Filled 2021-06-10 (×8): qty 100

## 2021-06-10 MED ORDER — ACETAMINOPHEN 650 MG RE SUPP: 650.0000 mg | Freq: Four times a day (QID) | RECTAL | Status: DC | PRN

## 2021-06-10 MED ORDER — PROPOFOL 10 MG/ML IV BOLUS
INTRAVENOUS | Status: DC | PRN
  Administered 2021-06-10: 50 mg via INTRAVENOUS

## 2021-06-10 MED ORDER — ALPRAZOLAM 0.5 MG PO TABS
0.5000 mg | ORAL_TABLET | Freq: Three times a day (TID) | ORAL | Status: DC | PRN
  Administered 2021-06-10 – 2021-06-11 (×2): 0.5 mg via ORAL
  Filled 2021-06-10 (×2): qty 1

## 2021-06-10 MED ORDER — DULOXETINE HCL 60 MG PO CPEP
90.0000 mg | ORAL_CAPSULE | Freq: Every day | ORAL | Status: DC
  Administered 2021-06-10 – 2021-06-12 (×3): 90 mg via ORAL
  Filled 2021-06-10 (×3): qty 1

## 2021-06-10 MED ORDER — METOCLOPRAMIDE HCL 5 MG/ML IJ SOLN
10.0000 mg | Freq: Once | INTRAMUSCULAR | Status: AC
  Administered 2021-06-10: 10 mg via INTRAVENOUS
  Filled 2021-06-10: qty 2

## 2021-06-10 MED ORDER — PROPRANOLOL HCL 20 MG PO TABS
10.0000 mg | ORAL_TABLET | Freq: Two times a day (BID) | ORAL | Status: DC
  Administered 2021-06-10 – 2021-06-11 (×4): 10 mg via ORAL
  Filled 2021-06-10 (×4): qty 1

## 2021-06-10 MED ORDER — DULOXETINE HCL 30 MG PO CPEP: 30.0000 mg | ORAL_CAPSULE | Freq: Every day | ORAL | Status: DC

## 2021-06-10 MED ORDER — PROPOFOL 500 MG/50ML IV EMUL
INTRAVENOUS | Status: DC | PRN
  Administered 2021-06-10: 150 ug/kg/min via INTRAVENOUS

## 2021-06-10 MED ORDER — LACTATED RINGERS IV SOLN: INTRAVENOUS | Status: DC

## 2021-06-10 MED ORDER — ONDANSETRON HCL 4 MG PO TABS: 4.0000 mg | ORAL_TABLET | Freq: Four times a day (QID) | ORAL | Status: DC | PRN

## 2021-06-10 MED ORDER — SODIUM CHLORIDE 0.9% IV SOLUTION: Freq: Once | INTRAVENOUS | Status: DC

## 2021-06-10 MED ORDER — TRAZODONE HCL 50 MG PO TABS
50.0000 mg | ORAL_TABLET | Freq: Every evening | ORAL | Status: DC | PRN
  Administered 2021-06-11: 50 mg via ORAL
  Filled 2021-06-10: qty 1

## 2021-06-10 MED ORDER — ALBUTEROL 90 MCG/ACT IN AERS: 2.0000 | INHALATION_SPRAY | Freq: Four times a day (QID) | RESPIRATORY_TRACT | Status: DC | PRN

## 2021-06-10 NOTE — Progress Notes (Signed)
Dr Wynetta Emery and Dr Jenetta Downer aware of H& H results after 1st unit of blood given. 2nd unit of PRBC started per verbal to give from Dr Wynetta Emery.

## 2021-06-10 NOTE — ED Notes (Signed)
Orthostatic VS  Lying BP 93/57 PR 86 Sitting BP 90/44 PR 87 Standing 0 min BP 82/46 PR 88 Standing 3 min BP 88/55 PR 88   Dr Rancour informed via secure chat.

## 2021-06-10 NOTE — Progress Notes (Signed)
Patient wallet and money sent to safe with Animal nutritionist. Patient home medications (Excedrin Migraine, nicotine gum and alprazolam '1mg'$  tablets (13.5 tabs)) counted with another RN and taken to pharmacy to store until discharge. Patient aware of the belongings and medications being taken and sent to safe and pharmacy and ok with plan. Belongings and home medications to be returned at time of discharge. Patient aware and agreeable with plan.

## 2021-06-10 NOTE — Transfer of Care (Signed)
Immediate Anesthesia Transfer of Care Note  Patient: [name]  Procedure(s) Performed: ESOPHAGOGASTRODUODENOSCOPY (EGD) WITH PROPOFOL BIOPSY  Patient Location: PACU  Anesthesia Type:General  Level of Consciousness: awake, alert  and sedated  Airway & Oxygen Therapy: Patient Spontanous Breathing and Patient connected to nasal cannula oxygen  Post-op Assessment: Report given to RN and Post -op Vital signs reviewed and stable  Post vital signs: Reviewed and stable  Last Vitals:  Vitals Value Taken Time  BP 116/61 1138  Temp 99.1 1139  Pulse 83 06/10/21 1138  Resp 20 1138  SpO2 98 % 06/10/21 1138  Vitals shown include unvalidated device data.  Last Pain:  Vitals:   06/10/21 1042  TempSrc: Oral  PainSc: 7          Complications: No notable events documented.

## 2021-06-10 NOTE — H&P (Signed)
History and Physical  [facility]  [name] [id number] DOB: 1960/12/15 DOA: 06/10/2021  PCP: Neale Burly, MD  Patient coming from: Home  Level of care: Stepdown  I have personally briefly reviewed patient's old medical records in [place]  Chief Complaint: abdominal pain and black stools  HPI: [name] is a 60 y.o. female with medical history significant asthma, GERD, PUD, IBS,  COPD, angina, ADHD and other history detailed below presents to ED with complaint of ongoing abdominal pain and black stools.  She was recently discharged from [facility] after a 2 day admission for a bowel obstruction.  She had been doing well until she started having abdominal pain and four episodes of black stool that started in the last 24 hours.  She has had vomiting and noticed small amounts of blood in the vomit.  She has 7/10 pain across her entire abdomen.  She reports dizziness and light-headedness.  She noted that she went to ED at [facility] yesterday but ended up going home.   She has no chest pain or SOB.    ED Course: Pt presented with soft BPs SBP in 80s. She was noted to have a Hg of 7.8 down from 10.2.  IV protonix started.  2 units PRBC ordered.  IV fluid and IV pain med bolused.  She complained of severe abdominal pain and abdominal distension and CT abdomen shows hiatal hernia with no evidence of obstruction and no changes from CTA that had been done yesterday at outside hospital.  GI was consulted and admission was requested.   Review of Systems: Review of Systems  Constitutional:  Positive for malaise/fatigue.  HENT: Negative.    Eyes: Negative.   Respiratory: Negative.    Cardiovascular: Negative.   Gastrointestinal:  Positive for abdominal pain, blood in stool, melena, nausea and vomiting.  Genitourinary: Negative.   Musculoskeletal: Negative.   Skin: Negative.   Neurological:  Positive for dizziness.  Endo/Heme/Allergies:  Negative for polydipsia. Does  not bruise/bleed easily.  Psychiatric/Behavioral: Negative.    All other systems reviewed and are negative.   Past Medical History:  Diagnosis Date   ADHD (attention deficit hyperactivity disorder)    Anginal pain (HCC)    Asthma    BMI (body mass index) 20.0-29.9 2009 127 LBS   Chronic abdominal pain 2003   EX LAP APR 2004 RUPTURE L OV CYST, JUL 2004 ADHESIONS   Chronic nausea    Clostridium difficile colitis 04/10/11 &11/2011   tx w/ flagyl   COPD (chronic obstructive pulmonary disease) ([place])    Degenerative disc disease    Depression    GERD (gastroesophageal reflux disease)    Hiatal hernia 2008   on EGD above   Irritable bowel syndrome 2004 CONSTIPATION   Migraines    MRSA infection    Dr. [name], currently under treatment   PTSD (post-traumatic stress disorder)    PUD (peptic ulcer disease) 01/2007   EGD Dr [name], 2 antral ulcers, negative h pylori   Rectal prolapse    S/P colonoscopy 06/02/09   normal ([name])   S/P endoscopy 03/26/11   gastritis-Dr [name]   SBO (small bowel obstruction) [facility])     Past Surgical History:  Procedure Laterality Date   ABDOMINAL HYSTERECTOMY     APPENDECTOMY     BACK SURGERY     BOWEL RESECTION     x2, secondary to adhesions   CERVICAL SPINE SURGERY     COLONOSCOPY  03/21/2012  [id number] ADENOMA(1) POOR PREP   COLONOSCOPY N/A 11/17/2013   [id number] mucosa in the terminal ileum/NORMAL surgical anastomosis/Small internal hemorrhoids   ESOPHAGOGASTRODUODENOSCOPY  03/23/2011    Normal esophagus without evidence of Barrett's, mass, erosions, or ulcerations./ Patchy erythema with occasional erosion in the antrum.  Biopsies  obtained via cold forceps to evaluate for H. pylori gastritis/ Small hiatal hernia./Normal duodenal bulb and second portion of the duodenum.   ESOPHAGOGASTRODUODENOSCOPY  03/21/2012   [id number] Hiatal hernia/ABDOMINALPAIN/DIARRHEA MOST LIKELY DUE TO IBS, GASTRITIS DUODENITIS   ESOPHAGOGASTRODUODENOSCOPY  N/A 02/22/2021   Procedure: ESOPHAGOGASTRODUODENOSCOPY (EGD);  Surgeon: Wilford Corner, MD;  Location: [facility];  Service: Endoscopy;  Laterality: N/A;   FLEXIBLE SIGMOIDOSCOPY  07/14/2002   Normal limited flexible sigmoidoscopy with stool in the rectum and rectosigmoid precluding a full colonoscopy   JOINT REPLACEMENT     LAPAROSCOPIC LYSIS INTESTINAL ADHESIONS     NECK SURGERY  2009   PARTIAL HYSTERECTOMY     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   Procedure: RIGHT TOTAL KNEE ARTHROPLASTY;  Surgeon: Marybelle Killings, MD;  Location: [place];  Service: Orthopedics;  Laterality: Right;   TUBAL LIGATION     UMBILICAL HERNIA REPAIR  2008   x5   UPPER GASTROINTESTINAL ENDOSCOPY  APR 2008 [facility] BLEEDING/PAIN   PUD   UPPER GASTROINTESTINAL ENDOSCOPY  JUL 2012 [facility] PAIN   MILD GASTRITIS     reports that she has been smoking cigarettes. She has a 5.00 pack-year smoking history. She has never used smokeless tobacco. She reports that she does not currently use alcohol. She reports that she does not use drugs.  Allergies  Allergen Reactions   Penicillins Anaphylaxis and Other (See Comments)    Childhood allergy  Has patient had a PCN reaction causing immediate rash, facial/tongue/throat swelling, SOB or lightheadedness with hypotension: Yes Has patient had a PCN reaction causing severe rash involving mucus membranes or skin necrosis: No Has patient had a PCN reaction that required hospitalization Yes Has patient had a PCN reaction occurring within the last 10 years: No If all of the above answers are "NO", then may proceed with Cephalosporin use.    Clarithromycin Nausea And Vomiting   Moxifloxacin Nausea And Vomiting   Quinolones Nausea And Vomiting   Salicylates Nausea And Vomiting   Lactose Intolerance (Gi) Diarrhea   Lactulose Diarrhea   Aspirin Other (See Comments)    Burning of stomach. REACTION: GI upset   Buprenorphine Hcl Itching and  Other (See Comments)    Suboxone   Ibuprofen Nausea Only and Other (See Comments)    Severe heartburn, Upset stomach due to acid reflux   Morphine And Related Itching   Sulfa Antibiotics Itching    Family History  Adopted: Yes    Prior to Admission medications   Medication Sig Start Date End Date Taking? Authorizing Provider  albuterol (PROVENTIL) (2.5 MG/3ML) 0.083% nebulizer solution Take 2.5 mg by nebulization every 6 (six) hours as needed for wheezing or shortness of breath.    [provider]  albuterol (PROVENTIL,VENTOLIN) 90 MCG/ACT inhaler Inhale 2 puffs into the lungs every 6 (six) hours as needed for wheezing or shortness of breath. For shortness of breath    [provider]  ALPRAZolam (XANAX) 1 MG tablet Take 1.5 mg by mouth daily. 02/11/21   [provider]  DULoxetine (CYMBALTA) 60 MG capsule Take 120 mg by mouth daily.    [provider]  gabapentin (NEURONTIN) 300 MG  capsule Take 300-600 mg by mouth See admin instructions. Take 300 mg in the AM and 600 mg at bedtime    [provider]  HYDROcodone-acetaminophen (NORCO) 10-325 MG tablet Take 1 tablet by mouth 4 (four) times daily as needed for moderate pain. 02/15/21   [provider]  mirtazapine (REMERON) 15 MG tablet Take 15 mg by mouth at bedtime as needed (for rest).  08/17/13   [provider]  OLANZapine (ZYPREXA) 15 MG tablet Take 15 mg by mouth at bedtime. 01/26/21   [provider]  ondansetron (ZOFRAN) 4 MG tablet Take 1 tablet (4 mg total) by mouth every 6 (six) hours as needed for nausea. 02/23/21   Aline August, MD  pantoprazole (PROTONIX) 40 MG tablet Take 1 tablet (40 mg total) by mouth 2 (two) times daily. '40mg'$  BID for 6 weeks then once a day as per GI/Dr. Michail Sermon 02/23/21   Aline August, MD  prazosin (MINIPRESS) 5 MG capsule Take 5 mg by mouth at bedtime. 05/23/17   [provider]  promethazine (PHENERGAN) 25 MG suppository Place  25 mg rectally every 6 (six) hours as needed for nausea or vomiting. 12/23/20   [provider]  promethazine (PHENERGAN) 25 MG tablet Take 12.5 mg by mouth every 6 (six) hours as needed for nausea or vomiting. 11/24/20   [provider]  propranolol (INDERAL) 10 MG tablet Take 10 mg by mouth 2 (two) times daily. 01/20/21   [provider]  tiZANidine (ZANAFLEX) 2 MG tablet Take 2 mg by mouth 2 (two) times daily as needed for muscle spasms. 02/15/21   [provider]  traZODone (DESYREL) 50 MG tablet Take 50 mg by mouth at bedtime as needed for sleep. 05/23/17   [provider]    Physical Exam: Vitals:   06/10/21 0400 06/10/21 0430 06/10/21 0500 06/10/21 0700  BP: 104/77 114/64 118/64 110/69  Pulse: 88 94 93 (!) 101  Resp:  (!) 22 (!) 24 (!) 26  Temp:  98.1 F (36.7 C)    TempSrc:  Oral    SpO2: 97% 95% 95% 92%  Weight:      Height:        Constitutional: thin female, c/o abdominal pain, NAD, calm, comfortable Eyes: PERRL, lids and conjunctivae normal ENMT: Mucous membranes are pale and dry. Posterior pharynx clear of any exudate or lesions.  Neck: normal, supple, no masses, no thyromegaly Respiratory: clear to auscultation bilaterally, no wheezing, no crackles. Normal respiratory effort. No accessory muscle use.  Cardiovascular: normal s1, s2 sounds, no murmurs / rubs / gallops. No extremity edema. 2+ pedal pulses. No carotid bruits.  Abdomen: diffuse tenderness, no masses palpated. No hepatosplenomegaly. Bowel sounds positive.  Musculoskeletal: no clubbing / cyanosis. No joint deformity upper and lower extremities. Good ROM, no contractures. Normal muscle tone.  Skin: no rashes, lesions, ulcers. No induration Neurologic: CN 2-12 grossly intact. Sensation intact, DTR normal. Strength 5/5 in all 4.  Psychiatric: Normal judgment and insight. Alert and oriented x 3. Normal mood.   Labs on Admission: I have personally reviewed following labs and  imaging studies  CBC: Recent Labs  Lab 06/10/21 0232  WBC 12.0*  NEUTROABS 8.7*  HGB 7.8*  HCT 26.4*  MCV 81.7  PLT [id number]   Basic Metabolic Panel: Recent Labs  Lab 06/10/21 0232  NA 139  K 3.6  CL 109  CO2 27  GLUCOSE 100*  BUN 13  CREATININE 0.44  CALCIUM 8.0*   GFR: Estimated Creatinine  Clearance: 73.1 mL/min (by C-G formula based on SCr of 0.44 mg/dL). Liver Function Tests: Recent Labs  Lab 06/10/21 0232  AST 16  ALT 21  ALKPHOS 63  BILITOT 0.1*  PROT 5.2*  ALBUMIN 2.9*   No results for input(s): LIPASE, AMYLASE in the last 168 hours. No results for input(s): AMMONIA in the last 168 hours. Coagulation Profile: Recent Labs  Lab 06/10/21 0232  INR 0.9   Cardiac Enzymes: No results for input(s): CKTOTAL, CKMB, CKMBINDEX, TROPONINI in the last 168 hours. BNP (last 3 results) No results for input(s): PROBNP in the last 8760 hours. HbA1C: No results for input(s): HGBA1C in the last 72 hours. CBG: No results for input(s): GLUCAP in the last 168 hours. Lipid Profile: No results for input(s): CHOL, HDL, LDLCALC, TRIG, CHOLHDL, LDLDIRECT in the last 72 hours. Thyroid Function Tests: No results for input(s): TSH, T4TOTAL, FREET4, T3FREE, THYROIDAB in the last 72 hours. Anemia Panel: No results for input(s): VITAMINB12, FOLATE, FERRITIN, TIBC, IRON, RETICCTPCT in the last 72 hours. Urine analysis:    Component Value Date/Time   COLORURINE STRAW (A) 02/01/2020 1337   APPEARANCEUR CLEAR 02/01/2020 1337   LABSPEC 1.025 02/01/2020 1337   PHURINE 5.0 02/01/2020 1337   GLUCOSEU NEGATIVE 02/01/2020 1337   GLUCOSEU NEG mg/dL 10/21/2009 1550   HGBUR NEGATIVE 02/01/2020 1337   BILIRUBINUR NEGATIVE 02/01/2020 1337   KETONESUR NEGATIVE 02/01/2020 1337   PROTEINUR NEGATIVE 02/01/2020 1337   UROBILINOGEN 0.2 12/07/2014 1811   NITRITE NEGATIVE 02/01/2020 1337   LEUKOCYTESUR NEGATIVE 02/01/2020 1337    Radiological Exams on Admission: CT ABDOMEN PELVIS W  CONTRAST  Result Date: 06/10/2021 CLINICAL DATA:  Abdominal pain.  Recent small bowel obstruction EXAM: CT ABDOMEN AND PELVIS WITH CONTRAST TECHNIQUE: Multidetector CT imaging of the abdomen and pelvis was performed using the standard protocol following bolus administration of intravenous contrast. CONTRAST:  76m OMNIPAQUE IOHEXOL 350 MG/ML SOLN COMPARISON:  06/09/2021 [place] [facility] FINDINGS: Lower chest: Large hiatal hernia. Heart is normal size. No acute abnormality. Hepatobiliary: No focal hepatic abnormality. Gallbladder unremarkable. Pancreas: No focal abnormality or ductal dilatation. Spleen: No focal abnormality.  Normal size. Adrenals/Urinary Tract: No adrenal abnormality. No focal renal abnormality. No stones or hydronephrosis. Urinary bladder is unremarkable. Stomach/Bowel: Postoperative changes in the sigmoid colon. Postoperative changes in small bowel within the left upper quadrant. No evidence of bowel obstruction. Vascular/Lymphatic: No evidence of aneurysm or adenopathy. Reproductive: Prior hysterectomy.  No adnexal masses. Other: No free fluid or free air. Musculoskeletal: No acute bony abnormality. Postoperative changes in the lumbar spine from posterior fusion. IMPRESSION: Large hiatal hernia. No evidence of bowel obstruction. No change from recent CTA abdomen/pelvis. Electronically Signed   By: KRolm BaptiseM.D.   On: 06/10/2021 03:58    Assessment/Plan Principal Problem:   Gastrointestinal hemorrhage with melena Active Problems:   Depression with anxiety   Asthma   GERD   Peptic ulcer disease   IBS   NECK PAIN, CHRONIC   HX, PERSONAL, PAST NONCOMPLIANCE   Chronic nausea   Hx of adenomatous colonic polyps   Tobacco abuse   COPD (chronic obstructive pulmonary disease) (HCC)   Chronic pain syndrome   Iron deficiency anemia   Acute blood loss anemia   Hypotension   Acute upper GI bleeding with melena and severe abdominal pain - with history of peptic  ulcer disease we have started IV protonix infusion.  Requested GI consultation.  NPO.  IV fluid.  Acute blood loss anemia - Hg  down to 7.8 with hypotension.  Transfusing 2 units PRBCs.  Recheck Hg after transfusion.  Keep 2 units on stand-by if needed.   Abdominal pain generalized - Suspect peptic ulcer disease, IV pain medication ordered.    GERD - IV protonix infusion ordered.    Nausea and vomiting - IV fluid ordered, IV nausea medication and IV protonix ordered.    Hypotension - treated supportively with IV fluid and PRBC transfusion.    Tobacco use and COPD and asthma - Offer nicotine patch, bronchodilators ordered as needed.   Chronic pain syndrome - IV pain medication ordered.    Depression / Anxiety - resume home medication when home meds have been reconciled.    Critical Care Procedure Note Authorized and Performed by: Murvin Natal MD  Total Critical Care time:  70 mins Due to a high probability of clinically significant, life threatening deterioration, the patient required my highest level of preparedness to intervene emergently and I personally spent this critical care time directly and personally managing the patient.  This critical care time included obtaining a history; examining the patient, pulse oximetry; ordering and review of studies; arranging urgent treatment with development of a management plan; evaluation of patient's response of treatment; frequent reassessment; and discussions with other providers.  This critical care time was performed to assess and manage the high probability of imminent and life threatening deterioration that could result in multi-organ failure.  It was exclusive of separately billable procedures and treating other patients and teaching time.    DVT prophylaxis: SCD   Code Status: Full   Family Communication: updated patient at bedside with plan of care   Disposition Plan: TBD   Consults called: GI   Admission status: INP   Level of care:  Stepdown Irwin Brakeman MD [facility] How to contact the [facility] Attending or Consulting provider [place] or covering provider during after hours [place], for this patient?  Check the care team in [facility] and look for a) attending/consulting [facility] provider listed and b) the [facility] team listed Log into [email or web address] and use [place]'s universal password to access. If you do not have the password, please contact the hospital operator. Locate the [facility] provider you are looking for under [facility] and page to a number that you can be directly reached. If you still have difficulty reaching the provider, please page the [facility] (Director on Call) for the Hospitalists listed on amion for assistance.   If 7PM-7AM, please contact night-coverage [email or web address] Password [email or web address]  06/10/2021, 8:00 AM

## 2021-06-10 NOTE — Op Note (Addendum)
[facility] Patient Name: [name] Procedure Date: 06/10/2021 10:52 AM MRN: [id number] Date of Birth: 08-Sep-1961 Attending MD: Maylon Peppers ,  CSN: [id number] Age: 60 Admit Type: Inpatient Procedure:                Upper GI endoscopy Indications:              Coffee-ground emesis, Melena Providers:                Maylon Peppers, Lurline Del, RN, Casimer Bilis, Technician Referring MD:              Medicines:                Monitored Anesthesia Care Complications:            No immediate complications. Estimated Blood Loss:     Estimated blood loss: none. Procedure:                Pre-Anesthesia Assessment:                           - Prior to the procedure, a History and Physical                            was performed, and patient medications, allergies                            and sensitivities were reviewed. The patient's                            tolerance of previous anesthesia was reviewed.                           - The risks and benefits of the procedure and the                            sedation options and risks were discussed with the                            patient. All questions were answered and informed                            consent was obtained.                           - ASA Grade Assessment: III - A patient with severe                            systemic disease.                           After obtaining informed consent, the endoscope was                            passed under direct vision. Throughout the  procedure, the patient's blood pressure, pulse, and                            oxygen saturations were monitored continuously. The                            [id number] scope was introduced through the                            mouth, and advanced to the second part of duodenum.                            The upper GI endoscopy was accomplished without                             difficulty. The patient tolerated the procedure                            well. Scope In: 11:17:09 AM Scope Out: 11:26:51 AM Total Procedure Duration: 0 hours 9 minutes 42 seconds  Findings:      A 6 cm hiatal hernia with multiple Cameron ulcers was found. None of the       ulcers/erosions were actively bleeding. The hiatal narrowing was 43 cm       from the incisors. The Z-line was 37 cm from the incisors.      A few localized small erosions with no stigmata of recent bleeding were       found in the gastric body. Biopsies from body and antrum were taken with       a cold forceps for Helicobacter pylori testing. Upon careful inspection,       no presence of hematin or active bleeding was present.      Food (residue) was found in the first portion of the duodenum and in the       second portion of the duodenum. The rest of the small bowel was normal.       Upon careful inspection, no presence of hematin or active bleeding was       present. Impression:               - 6 cm hiatal hernia with multiple Cameron ulcers.                           - Erosive gastropathy with no stigmata of recent                            bleeding. Biopsied.                           - Retained food in the duodenum. Moderate Sedation:      Per Anesthesia Care Recommendation:           - Return patient to hospital ward for ongoing care.                           - Clear liquid diet.                           -  Switch to pantoprazole 40 mg every 12 hours.                           - Will require evaluation as outpatient for                            surgical correction of large hiatal hernia at                            [facility].                           - Await pathology results. Procedure Code(s):        --- Professional ---                           [phone], Esophagogastroduodenoscopy, flexible,                            transoral; with biopsy, single or  multiple Diagnosis Code(s):        --- Professional ---                           K44.9, Diaphragmatic hernia without obstruction or                            gangrene                           K25.9, Gastric ulcer, unspecified as acute or                            chronic, without hemorrhage or perforation                           K31.89, Other diseases of stomach and duodenum                           K92.0, Hematemesis                           K92.1, Melena (includes Hematochezia) CPT copyright 2019 American Medical Association. All rights reserved. The codes documented in this report are preliminary and upon coder review may  be revised to meet current compliance requirements. Maylon Peppers, MD Maylon Peppers,  06/10/2021 11:34:57 AM This report has been signed electronically. Number of Addenda: 0

## 2021-06-10 NOTE — Anesthesia Postprocedure Evaluation (Signed)
Anesthesia Post Note  Patient: [name]  Procedure(s) Performed: ESOPHAGOGASTRODUODENOSCOPY (EGD) WITH PROPOFOL BIOPSY  Patient location during evaluation: PACU Anesthesia Type: General Level of consciousness: awake and alert and oriented Pain management: pain level controlled Vital Signs Assessment: post-procedure vital signs reviewed and stable Respiratory status: spontaneous breathing and respiratory function stable Cardiovascular status: blood pressure returned to baseline and stable Postop Assessment: no apparent nausea or vomiting Anesthetic complications: no   No notable events documented.   Last Vitals:  Vitals:   06/10/21 1042 06/10/21 1138  BP: 111/81 116/61  Pulse:  83  Resp: 20 (!) 21  Temp: 37.1 C 37.3 C  SpO2: 98% 98%    Last Pain:  Vitals:   06/10/21 1138  TempSrc:   PainSc: Asleep                 Cassi Jenne C Exodus Kutzer

## 2021-06-10 NOTE — Progress Notes (Addendum)
RN able to flush and draw blood from port with patient being on her right side. Patient has no complaints of pain or discomfort from port site or during draw & flush. Dr Wynetta Emery aware.

## 2021-06-10 NOTE — Brief Op Note (Signed)
06/10/2021  11:38 AM  PATIENT:  [name]  60 y.o. female  PRE-OPERATIVE DIAGNOSIS:  melea  POST-OPERATIVE DIAGNOSIS:  Lysbeth Galas lesions;gastric erosions;37-43 hernia;  PROCEDURE:  Procedure(s): ESOPHAGOGASTRODUODENOSCOPY (EGD) WITH PROPOFOL (N/A) BIOPSY  SURGEON:  Surgeon(s) and Role:    * Harvel Quale, MD - Primary  Patient underwent EGD under propofol sedation.  Tolerated the procedure adequately. A 6 cm hiatal hernia with multiple Cameron ulcers was found. None of the ulcers/erosions were actively bleeding. The hiatal narrowing was 43 cm from the incisors.  The Z-line was 37 cm from the incisors.  No alterations were found in the esophagus.  There was presence of gastric erosions in the gastric body without stigmata of bleeding.  Biopsies were taken from the body and antrum to rule out H. pylori.  There was presence of scant amount of food in the first and second portion of the duodenum but no hematin of hematin or stigmata of recent bleeding was found.  RECOMMENDATIONS: - Return patient to hospital ward for ongoing care.  - Clear liquid diet.  - Switch to pantoprazole 40 mg every 12 hours. - Will require evaluation as outpatient for surgical correction of large hiatal hernia at [facility]. - Await pathology results.   Maylon Peppers, MD Gastroenterology and Hepatology [facility]

## 2021-06-10 NOTE — ED Provider Notes (Signed)
[facility] EMERGENCY DEPARTMENT Provider Note   CSN: [id number] Arrival date & time: 06/10/21  0101     History Chief Complaint  Patient presents with   Abdominal Pain   Melena    [name] is a 60 y.o. female.  Patient presents with diffuse abdominal pain, dark stools with nausea and vomiting.  States she was admitted to outside hospital about 4 days ago with diffuse abdominal pain and found to have a bowel obstruction.  In the past 24 hours she has worsening lower abdominal pain as well as 4 episodes of black tarry stools.  Multiple episodes of nausea and vomiting that has been dark as well.  Some dizziness and lightheadedness.  She was seen in the ED at outside hospital yesterday.  Decided to go home.  States she is here today because she has worsening pain, black stools and dizziness.  No blood thinner use.  History of ulcers in the past.  No chest pain or shortness of breath.  No fever.  States the vomiting is new today abdominal pain is new.  Has increasing pain and dizziness that she was seen yesterday.  The history is provided by the patient.  Abdominal Pain Associated symptoms: fatigue, nausea and vomiting   Associated symptoms: no chest pain, no cough, no dysuria, no fever, no hematuria and no shortness of breath       Past Medical History:  Diagnosis Date   ADHD (attention deficit hyperactivity disorder)    Anginal pain (HCC)    Asthma    BMI (body mass index) 20.0-29.9 2009 127 LBS   Chronic abdominal pain 2003   EX LAP APR 2004 RUPTURE L OV CYST, JUL 2004 ADHESIONS   Chronic nausea    Clostridium difficile colitis 04/10/11 &11/2011   tx w/ flagyl   COPD (chronic obstructive pulmonary disease) ([place])    Degenerative disc disease    Depression    GERD (gastroesophageal reflux disease)    Hiatal hernia 2008   on EGD above   Irritable bowel syndrome 2004 CONSTIPATION   Migraines    MRSA infection    Dr. [name], currently under treatment   PTSD  (post-traumatic stress disorder)    PUD (peptic ulcer disease) 01/2007   EGD Dr [name], 2 antral ulcers, negative h pylori   Rectal prolapse    S/P colonoscopy 06/02/09   normal ([name])   S/P endoscopy 03/26/11   gastritis-Dr Fields   SBO (small bowel obstruction) [facility])     Patient Active Problem List   Diagnosis Date Noted   Iron deficiency anemia 02/21/2021   Upper GI bleed 02/20/2021   Acute encephalopathy 02/02/2020   Fall from ground level 02/02/2020   Dehydration 02/02/2020   Acute prerenal azotemia 02/02/2020   COPD with acute exacerbation ([place]) 02/02/2020   Chronic pain syndrome [id number]   Acute metabolic encephalopathy 0000000   Arthritis of right knee 05/06/2017   Swelling of right lower extremity 04/23/2017   Acute pain of right knee 01/13/2017   Left genital labial abscess 10/17/2016   Labial abscess 10/17/2016   Anxiety state 03/15/2015   Anemia 03/14/2015   COPD (chronic obstructive pulmonary disease) ([place]) 03/13/2015   Ileus ([place]) 04/18/2014   Tobacco abuse 04/18/2014   Hx of adenomatous colonic polyps 09/07/2013   Muscle spasms of neck 06/09/2013   Cervical pain 03/17/2013   C. difficile colitis 06/25/2011   Chronic nausea 03/13/2011   Depression with anxiety 05/09/2007   ALLERGIC RHINITIS 05/09/2007  ASTHMA 05/09/2007   GERD 05/09/2007   PEPTIC ULCER DISEASE 05/09/2007   IBS 05/09/2007   ARTHRITIS 05/09/2007   HIP PAIN, LEFT 05/09/2007   NECK PAIN, CHRONIC 05/09/2007   LOW BACK PAIN 05/09/2007   CONSTIPATION, HX OF 05/09/2007   HX, PERSONAL, PAST NONCOMPLIANCE 05/09/2007    Past Surgical History:  Procedure Laterality Date   ABDOMINAL HYSTERECTOMY     APPENDECTOMY     BACK SURGERY     BOWEL RESECTION     x2, secondary to adhesions   CERVICAL SPINE SURGERY     COLONOSCOPY  03/21/2012    [id number] ADENOMA(1) POOR PREP   COLONOSCOPY N/A 11/17/2013   [id number] mucosa in the terminal ileum/NORMAL surgical anastomosis/Small internal  hemorrhoids   ESOPHAGOGASTRODUODENOSCOPY  03/23/2011    Normal esophagus without evidence of Barrett's, mass, erosions, or ulcerations./ Patchy erythema with occasional erosion in the antrum.  Biopsies  obtained via cold forceps to evaluate for H. pylori gastritis/ Small hiatal hernia./Normal duodenal bulb and second portion of the duodenum.   ESOPHAGOGASTRODUODENOSCOPY  03/21/2012   [id number] Hiatal hernia/ABDOMINALPAIN/DIARRHEA MOST LIKELY DUE TO IBS, GASTRITIS DUODENITIS   ESOPHAGOGASTRODUODENOSCOPY N/A 02/22/2021   Procedure: ESOPHAGOGASTRODUODENOSCOPY (EGD);  Surgeon: Wilford Corner, MD;  Location: [facility];  Service: Endoscopy;  Laterality: N/A;   FLEXIBLE SIGMOIDOSCOPY  07/14/2002   Normal limited flexible sigmoidoscopy with stool in the rectum and rectosigmoid precluding a full colonoscopy   JOINT REPLACEMENT     LAPAROSCOPIC LYSIS INTESTINAL ADHESIONS     NECK SURGERY  2009   PARTIAL HYSTERECTOMY     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   Procedure: RIGHT TOTAL KNEE ARTHROPLASTY;  Surgeon: Marybelle Killings, MD;  Location: [facility];  Service: Orthopedics;  Laterality: Right;   TUBAL LIGATION     UMBILICAL HERNIA REPAIR  2008   x5   UPPER GASTROINTESTINAL ENDOSCOPY  APR 2008 [facility] BLEEDING/PAIN   PUD   UPPER GASTROINTESTINAL ENDOSCOPY  JUL 2012 [facility] PAIN   MILD GASTRITIS     OB History     Gravida  1   Para  1   Term  1   Preterm      AB      Living  1      SAB      IAB      Ectopic      Multiple      Live Births              Family History  Adopted: Yes    Social History   Tobacco Use   Smoking status: Every Day    Packs/day: 0.50    Years: 10.00    Pack years: 5.00    Types: Cigarettes   Smokeless tobacco: Never  Vaping Use   Vaping Use: Former  Substance Use Topics   Alcohol use: Not Currently    Comment: 05/07/2017 "quit when I was 19; drank for 1 year total"   Drug use: No    Home  Medications Prior to Admission medications   Medication Sig Start Date End Date Taking? Authorizing Provider  albuterol (PROVENTIL) (2.5 MG/3ML) 0.083% nebulizer solution Take 2.5 mg by nebulization every 6 (six) hours as needed for wheezing or shortness of breath.    [provider]  albuterol (PROVENTIL,VENTOLIN) 90 MCG/ACT inhaler Inhale 2 puffs into the lungs every 6 (six) hours as needed for wheezing or shortness of breath. For shortness of breath    [provider]  ALPRAZolam (XANAX) 1 MG tablet Take 1.5 mg by mouth daily. 02/11/21   [provider]  DULoxetine (CYMBALTA) 60 MG capsule Take 120 mg by mouth daily.    [provider]  gabapentin (NEURONTIN) 300 MG capsule Take 300-600 mg by mouth See admin instructions. Take 300 mg in the AM and 600 mg at bedtime    [provider]  HYDROcodone-acetaminophen (NORCO) 10-325 MG tablet Take 1 tablet by mouth 4 (four) times daily as needed for moderate pain. 02/15/21   [provider]  mirtazapine (REMERON) 15 MG tablet Take 15 mg by mouth at bedtime as needed (for rest).  08/17/13   [provider]  OLANZapine (ZYPREXA) 15 MG tablet Take 15 mg by mouth at bedtime. 01/26/21   [provider]  ondansetron (ZOFRAN) 4 MG tablet Take 1 tablet (4 mg total) by mouth every 6 (six) hours as needed for nausea. 02/23/21   Aline August, MD  pantoprazole (PROTONIX) 40 MG tablet Take 1 tablet (40 mg total) by mouth 2 (two) times daily. '40mg'$  BID for 6 weeks then once a day as per GI/Dr. Michail Sermon 02/23/21   Aline August, MD  prazosin (MINIPRESS) 5 MG capsule Take 5 mg by mouth at bedtime. 05/23/17   [provider]  promethazine (PHENERGAN) 25 MG suppository Place 25 mg rectally every 6 (six) hours as needed for nausea or vomiting. 12/23/20   [provider]  promethazine (PHENERGAN) 25 MG tablet Take 12.5 mg by mouth every 6 (six) hours as needed for nausea or vomiting. 11/24/20    [provider]  propranolol (INDERAL) 10 MG tablet Take 10 mg by mouth 2 (two) times daily. 01/20/21   [provider]  tiZANidine (ZANAFLEX) 2 MG tablet Take 2 mg by mouth 2 (two) times daily as needed for muscle spasms. 02/15/21   [provider]  traZODone (DESYREL) 50 MG tablet Take 50 mg by mouth at bedtime as needed for sleep. 05/23/17   [provider]    Allergies    Penicillins, Clarithromycin, Moxifloxacin, Quinolones, Salicylates, Lactose intolerance (gi), Lactulose, Aspirin, Buprenorphine hcl, Ibuprofen, Morphine and related, and Sulfa antibiotics  Review of Systems   Review of Systems  Constitutional:  Positive for activity change, appetite change and fatigue. Negative for fever.  HENT:  Negative for congestion and rhinorrhea.   Respiratory:  Negative for cough, chest tightness and shortness of breath.   Cardiovascular:  Negative for chest pain.  Gastrointestinal:  Positive for abdominal pain, blood in stool, nausea and vomiting.  Genitourinary:  Negative for dysuria and hematuria.  Musculoskeletal:  Negative for arthralgias and myalgias.  Skin:  Negative for rash.  Neurological:  Positive for weakness and light-headedness. Negative for dizziness and headaches.   all other systems are negative except as noted in the HPI and PMH.   Physical Exam Updated Vital Signs BP 94/69 (BP Location: Left Arm)   Pulse 82   Temp 98.6 F (37 C) (Oral)   Resp 18   Ht '5\' 4"'$  (1.626 m)   Wt 74.8 kg   SpO2 99%   BMI 28.32 kg/m   Physical Exam Vitals and nursing note reviewed.  Constitutional:      General: She is not in acute distress.    Appearance: She is well-developed.  HENT:     Head: Normocephalic and atraumatic.     Mouth/Throat:     Pharynx: No oropharyngeal exudate.  Eyes:     Conjunctiva/sclera: Conjunctivae normal.  Pupils: Pupils are equal, round, and reactive to light.  Neck:     Comments: No meningismus. Cardiovascular:      Rate and Rhythm: Normal rate and regular rhythm.     Heart sounds: Normal heart sounds. No murmur heard. Pulmonary:     Effort: Pulmonary effort is normal. No respiratory distress.     Breath sounds: Normal breath sounds.  Abdominal:     Palpations: Abdomen is soft.     Tenderness: There is abdominal tenderness. There is guarding. There is no rebound.     Comments: Diffuse tenderness, no guarding or rebound  Genitourinary:    Comments: Chaperone present [employer].  No gross blood.  No hemorrhoids or fissures Musculoskeletal:        General: No tenderness. Normal range of motion.     Cervical back: Normal range of motion and neck supple.  Skin:    General: Skin is warm.  Neurological:     Mental Status: She is alert and oriented to person, place, and time.     Cranial Nerves: No cranial nerve deficit.     Motor: No abnormal muscle tone.     Coordination: Coordination normal.     Comments:  5/5 strength throughout. CN 2-12 intact.Equal grip strength.   Psychiatric:        Behavior: Behavior normal.    ED Results / Procedures / Treatments   Labs (all labs ordered are listed, but only abnormal results are displayed) Labs Reviewed  CBC WITH DIFFERENTIAL/PLATELET - Abnormal; Notable for the following components:      Result Value   WBC 12.0 (*)    RBC 3.23 (*)    Hemoglobin 7.8 (*)    HCT 26.4 (*)    MCH 24.1 (*)    MCHC 29.5 (*)    RDW 23.4 (*)    Neutro Abs 8.7 (*)    Monocytes Absolute 1.3 (*)    Abs Immature Granulocytes 0.13 (*)    All other components within normal limits  COMPREHENSIVE METABOLIC PANEL - Abnormal; Notable for the following components:   Glucose, Bld 100 (*)    Calcium 8.0 (*)    Total Protein 5.2 (*)    Albumin 2.9 (*)    Total Bilirubin 0.1 (*)    Anion gap 3 (*)    All other components within normal limits  RESP PANEL BY RT-PCR (FLU A&B, COVID) [id number]  PROTIME-INR  LACTIC ACID, PLASMA  LACTIC ACID, PLASMA  POC OCCULT BLOOD, ED  TYPE AND  SCREEN  PREPARE RBC (CROSSMATCH)    EKG None  Radiology CT ABDOMEN PELVIS W CONTRAST  Result Date: 06/10/2021 CLINICAL DATA:  Abdominal pain.  Recent small bowel obstruction EXAM: CT ABDOMEN AND PELVIS WITH CONTRAST TECHNIQUE: Multidetector CT imaging of the abdomen and pelvis was performed using the standard protocol following bolus administration of intravenous contrast. CONTRAST:  63m OMNIPAQUE IOHEXOL 350 MG/ML SOLN COMPARISON:  06/09/2021 CTA abdomen/pelvis from [facility] FINDINGS: Lower chest: Large hiatal hernia. Heart is normal size. No acute abnormality. Hepatobiliary: No focal hepatic abnormality. Gallbladder unremarkable. Pancreas: No focal abnormality or ductal dilatation. Spleen: No focal abnormality.  Normal size. Adrenals/Urinary Tract: No adrenal abnormality. No focal renal abnormality. No stones or hydronephrosis. Urinary bladder is unremarkable. Stomach/Bowel: Postoperative changes in the sigmoid colon. Postoperative changes in small bowel within the left upper quadrant. No evidence of bowel obstruction. Vascular/Lymphatic: No evidence of aneurysm or adenopathy. Reproductive: Prior hysterectomy.  No adnexal masses. Other: No free fluid or free air.  Musculoskeletal: No acute bony abnormality. Postoperative changes in the lumbar spine from posterior fusion. IMPRESSION: Large hiatal hernia. No evidence of bowel obstruction. No change from recent CTA abdomen/pelvis. Electronically Signed   By: Rolm Baptise M.D.   On: 06/10/2021 03:58    Procedures .Critical Care  Date/Time: 06/10/2021 4:15 AM Performed by: Ezequiel Essex, MD Authorized by: Ezequiel Essex, MD   Critical care provider statement:    Critical care time (minutes):  45   Critical care was necessary to treat or prevent imminent or life-threatening deterioration of the following conditions: GI bleed, symptomatic anemia.   Critical care was time spent personally by me on the following activities:  Discussions with  consultants, evaluation of patient's response to treatment, examination of patient, ordering and performing treatments and interventions, ordering and review of laboratory studies, ordering and review of radiographic studies, pulse oximetry, re-evaluation of patient's condition, obtaining history from patient or surrogate and review of old charts   Medications Ordered in ED Medications - No data to display  ED Course  I have reviewed the triage vital signs and the nursing notes.  Pertinent labs & imaging results that were available during my care of the patient were reviewed by me and considered in my medical decision making (see chart for details).  EGD May- Normal esophagus. - Z-line regular, 40 cm from the incisors. - Large hiatal hernia. - Acute gastritis. - Normal examined duodenum. - No specimens collected. 2022    MDM Rules/Calculators/A&P                          Lower abdominal pain with black tarry stools and nausea and vomiting.  Recent bowel obstruction.  Stable vital signs with blood pressure soft in the 90s.  EGD in May showed gastritis.  She was Hemoccult positive yesterday but negative today.  CT at outside hospital yesterday was reassuring  Blood pressure soft in the 80s.  Heart rate is normal.  Hemoglobin was 10.2 yesterday but is 7.8 today  Initiate IV Protonix drip.  Patient will need admission for GI bleed and further evaluation  With drop in hemoglobin compared to yesterday Will initiate IV blood transfusion.  Hemoccult today is however negative.  Patient hypotensive in the 80s in the 90s with drop in hemoglobin and is agreeable to transfusion.  Given her degree of abdominal pain and distention, CT scan is repeated to rule out bowel obstruction.  This shows hiatal hernia with evidence of obstruction No change in CTA from yesterday.  Continue blood transfusion continue PPI drip.  Mental status remained stable throughout ED course despite soft blood  pressures.  Admission d/w Dr. Olevia Bowens.  Final Clinical Impression(s) / ED Diagnoses Final diagnoses:  Gastrointestinal hemorrhage, unspecified gastrointestinal hemorrhage type    Rx / DC Orders ED Discharge Orders     None        Dewan Emond, Annie Main, MD 06/10/21 [phone]

## 2021-06-10 NOTE — ED Triage Notes (Signed)
  Patient comes in with abdominal pain and black stools.  Patient was admitted for two days with a bowel obstruction and discharged a few days ago.  Patient states within the last 24 hours she has had abdominal pain and 4 episodes of black stool.  Patient also endorses vomiting and has seen some blood in her emesis.  Pain 7/10, throbbing pain across abdomen.

## 2021-06-10 NOTE — ED Notes (Signed)
Patient transported to CT 

## 2021-06-10 NOTE — Progress Notes (Signed)
Patient complaining of abdominal pain 7/10. Offered patient ordered pain medication, tylenol. Patient refused pain medication intervention stating "it don't work" I asked patient if she was refusing the pain medication, patient once again stated "it don't work"

## 2021-06-10 NOTE — Anesthesia Preprocedure Evaluation (Addendum)
Anesthesia Evaluation  Patient identified by MRN, date of birth, ID band Patient awake    Reviewed: Allergy & Precautions, NPO status , Patient's Chart, lab work & pertinent test results  Airway Mallampati: II  TM Distance: >3 FB Neck ROM: Limited   Comment: Cervical spine sx Dental  (+) Dental Advisory Given, Missing   Pulmonary shortness of breath and Long-Term Oxygen Therapy, asthma , COPD,  COPD inhaler and oxygen dependent, Current Smoker,     + wheezing      Cardiovascular + angina Normal cardiovascular exam Rhythm:Regular Rate:Normal  01-Feb-2020 10:43:25 [facility]-ED ROUTINE RECORD Sinus tachycardia Cannot rule out Anterior infarct , age undetermined Abnormal ECG Confirmed by Fredia Sorrow [phone]) on 02/01/2020 10:51:08 AM   Neuro/Psych  Headaches, PSYCHIATRIC DISORDERS Anxiety Depression  Neuromuscular disease    GI/Hepatic hiatal hernia, PUD, GERD  Medicated,  Endo/Other    Renal/GU      Musculoskeletal  (+) Arthritis , Osteoarthritis,    Abdominal   Peds  (+) ADHD Hematology  (+) anemia ,   Anesthesia Other Findings Cervical spine sx  Reproductive/Obstetrics                           Anesthesia Physical Anesthesia Plan  ASA: 4  Anesthesia Plan: General   Post-op Pain Management:    Induction: Intravenous  PONV Risk Score and Plan: Propofol infusion  Airway Management Planned: Nasal Cannula and Natural Airway  Additional Equipment:   Intra-op Plan:   Post-operative Plan:   Informed Consent: I have reviewed the patients History and Physical, chart, labs and discussed the procedure including the risks, benefits and alternatives for the proposed anesthesia with the patient or authorized representative who has indicated his/her understanding and acceptance.     Dental advisory given  Plan Discussed with: Surgeon  Anesthesia Plan Comments:         Anesthesia Quick Evaluation

## 2021-06-10 NOTE — ED Notes (Addendum)
Port accessed. No blood return. Pt states that it hardly ever pulls back. Aware that lab will have to stick her

## 2021-06-10 NOTE — ED Notes (Signed)
Electronic blood consent obtained.  

## 2021-06-10 NOTE — Consult Note (Addendum)
Maylon Peppers, M.D. Gastroenterology & Hepatology                                           Patient Name: [name] Account #: '@[id number]' @   MRN: [id number] Admission Date: 06/10/2021 Date of Evaluation:  06/10/2021 Time of Evaluation: 9:45 AM   Referring Physician: Murlean Iba, MD  Chief Complaint: Melena and coffee-ground emesis  HPI:  This is a 60 y.o. female with history of ADHD, asthma, multiple bowel surgeries including small bowel resection complicated by recurrent episodes of small bowel obstruction due to adhesions, PTSD, depression, GERD, large hiatal hernia, chronic back pain due to spinal disease on opiate management, COPD, previous episodes of C. difficile colitis, who came to the hospital after she presented episodes of coffee-ground emesis and melena.  The patient is a poor historian.  She reports that she cannot recall exactly the timeline of her symptoms.  Was recently admitted to [facility] on 05/31/2021.  At that time she was complaining of abdominal pain as well as nausea and vomiting.  Underwent a CT of the abdomen that showed a high-grade partial small bowel obstruction, for which she underwent NG tube decompression with improvement of her symptoms during the next days.  She was also found to have COVID during that hospitalization.  Hemoglobin at the time of discharge was 11.0.  However, the patient states that for the last week she noticed having some worsening episodes of abdominal pain in the epigastric and lower abdominal area.  She reports that for the last 3 days she has presented some episodes of melena described as pellets in shape.  She got concerned as yesterday she had also presence of coffee-ground emesis which she had never presented before.  She initially went to [facility] again yesterday evening, hemoglobin was drawn at that time and showed a value of 10.2 with MCV of 78.  Underwent a CT angio of the abdomen and pelvis with IV contrast  that was negative for any acute abnormality and actually showed resolution of the proximal small bowel obstruction with presence of a large hiatal hernia.  Based on the clinical documentation, the patient was hesitant to be hospitalized 2 days and decided to follow with her primary gastroenterologist Dr. Wilford Corner.   she was discharged on Carafate and her Protonix was increased.  However, the patient got concerned as she had 1 more episode of coffee-ground emesis and decided come to the ER at [facility].  She states that she has had a longstanding history of intermittent constipation and diarrhea, which has been attributed to her IBS.  This has not changed recently.  Has not present any hematochezia, changes in weight, appetite.  Last time she ate was yesterday night at 8 PM and has not drank anything since then.  She denies taking NSAIDs or anticoagulation.  In the ED, she was HD stable but will borderline low blood pressure 94/69 with normal heart rate of 78, and afebrile. Labs were remarkable for CBC with a white blood cell count of 12,000, hemoglobin 7.8, MCV 81, platelets 374, CMP showed normal liver function test and renal function with BUN is slightly increased 13.  Lactic acid was 1.4, INR was 0.9.  COVID testing was negative.CT of the abdomen and pelvis with IV contrast was performed which showed large hiatal hernia but no other alterations.  Notably,  patient had a hemoglobin in the low tens for the last week as documented in care everywhere.  Patient was transfused 1 unit PRBC overnight.  She is on a PPI drip for now. Last BM was yesterday.  Upon review of her clinical notes, the patient had a similar presentation of coffee-ground emesis in May 2022 for which she was seen at [facility]. At that time she saw Dr. Wilford Corner for the first time and underwent an EGD on 02/22/2021 which showed a large hiatal hernia and gastritis but no source of bleeding.  She did not  follow-up with any gastroenterologist and does not have any gastroenterologist at the moment.  Past Medical History: SEE CHRONIC ISSSUES: Past Medical History:  Diagnosis Date   ADHD (attention deficit hyperactivity disorder)    Anginal pain (HCC)    Asthma    BMI (body mass index) 20.0-29.9 2009 127 LBS   Chronic abdominal pain 2003   EX LAP APR 2004 RUPTURE L OV CYST, JUL 2004 ADHESIONS   Chronic nausea    Clostridium difficile colitis 04/10/11 &11/2011   tx w/ flagyl   COPD (chronic obstructive pulmonary disease) ([place])    Degenerative disc disease    Depression    GERD (gastroesophageal reflux disease)    Hiatal hernia 2008   on EGD above   Irritable bowel syndrome 2004 CONSTIPATION   Migraines    MRSA infection    Dr. [name], currently under treatment   PTSD (post-traumatic stress disorder)    PUD (peptic ulcer disease) 01/2007   EGD Dr [name], 2 antral ulcers, negative h pylori   Rectal prolapse    S/P colonoscopy 06/02/09   normal ([name])   S/P endoscopy 03/26/11   gastritis-Dr [name]   SBO (small bowel obstruction) [facility])    Past Surgical History:  Past Surgical History:  Procedure Laterality Date   ABDOMINAL HYSTERECTOMY     APPENDECTOMY     BACK SURGERY     BOWEL RESECTION     x2, secondary to adhesions   CERVICAL SPINE SURGERY     COLONOSCOPY  03/21/2012    GQQ:PYPPJK ADENOMA(1) POOR PREP   COLONOSCOPY N/A 11/17/2013   DTO:IZTIWP mucosa in the terminal ileum/NORMAL surgical anastomosis/Small internal hemorrhoids   ESOPHAGOGASTRODUODENOSCOPY  03/23/2011    Normal esophagus without evidence of Barrett's, mass, erosions, or ulcerations./ Patchy erythema with occasional erosion in the antrum.  Biopsies  obtained via cold forceps to evaluate for H. pylori gastritis/ Small hiatal hernia./Normal duodenal bulb and second portion of the duodenum.   ESOPHAGOGASTRODUODENOSCOPY  03/21/2012   YKD:XIPJA Hiatal hernia/ABDOMINALPAIN/DIARRHEA MOST LIKELY DUE TO IBS,  GASTRITIS DUODENITIS   ESOPHAGOGASTRODUODENOSCOPY N/A 02/22/2021   Procedure: ESOPHAGOGASTRODUODENOSCOPY (EGD);  Surgeon: Wilford Corner, MD;  Location: [facility];  Service: Endoscopy;  Laterality: N/A;   FLEXIBLE SIGMOIDOSCOPY  07/14/2002   Normal limited flexible sigmoidoscopy with stool in the rectum and rectosigmoid precluding a full colonoscopy   JOINT REPLACEMENT     LAPAROSCOPIC LYSIS INTESTINAL ADHESIONS     NECK SURGERY  2009   PARTIAL HYSTERECTOMY     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   Procedure: RIGHT TOTAL KNEE ARTHROPLASTY;  Surgeon: Marybelle Killings, MD;  Location: [facility];  Service: Orthopedics;  Laterality: Right;   TUBAL LIGATION     UMBILICAL HERNIA REPAIR  2008   x5   UPPER GASTROINTESTINAL ENDOSCOPY  APR 2008 [facility] BLEEDING/PAIN   PUD   UPPER GASTROINTESTINAL ENDOSCOPY  JUL 2012 [facility] PAIN   MILD GASTRITIS   Family History:  Family History  Adopted: Yes   Social History:  Social History   Tobacco Use   Smoking status: Every Day    Packs/day: 0.50    Years: 10.00    Pack years: 5.00    Types: Cigarettes   Smokeless tobacco: Never  Vaping Use   Vaping Use: Former  Substance Use Topics   Alcohol use: Not Currently    Comment: 05/07/2017 "quit when I was 19; drank for 1 year total"   Drug use: No    Home Medications:  Prior to Admission medications   Medication Sig Start Date End Date Taking? Authorizing Provider  albuterol (PROVENTIL) (2.5 MG/3ML) 0.083% nebulizer solution Take 2.5 mg by nebulization every 6 (six) hours as needed for wheezing or shortness of breath.    [provider]  albuterol (PROVENTIL,VENTOLIN) 90 MCG/ACT inhaler Inhale 2 puffs into the lungs every 6 (six) hours as needed for wheezing or shortness of breath. For shortness of breath    [provider]  ALPRAZolam (XANAX) 1 MG tablet Take 1.5 mg by mouth daily. 02/11/21   [provider]  DULoxetine (CYMBALTA) 60  MG capsule Take 120 mg by mouth daily.    [provider]  gabapentin (NEURONTIN) 300 MG capsule Take 300-600 mg by mouth See admin instructions. Take 300 mg in the AM and 600 mg at bedtime    [provider]  HYDROcodone-acetaminophen (NORCO) 10-325 MG tablet Take 1 tablet by mouth 4 (four) times daily as needed for moderate pain. 02/15/21   [provider]  mirtazapine (REMERON) 15 MG tablet Take 15 mg by mouth at bedtime as needed (for rest).  08/17/13   [provider]  OLANZapine (ZYPREXA) 15 MG tablet Take 15 mg by mouth at bedtime. 01/26/21   [provider]  ondansetron (ZOFRAN) 4 MG tablet Take 1 tablet (4 mg total) by mouth every 6 (six) hours as needed for nausea. 02/23/21   Aline August, MD  pantoprazole (PROTONIX) 40 MG tablet Take 1 tablet (40 mg total) by mouth 2 (two) times daily. 58m BID for 6 weeks then once a day as per GI/Dr. SMichail Sermon5/19/22   AAline August MD  prazosin (MINIPRESS) 5 MG capsule Take 5 mg by mouth at bedtime. 05/23/17   [provider]  promethazine (PHENERGAN) 25 MG suppository Place 25 mg rectally every 6 (six) hours as needed for nausea or vomiting. 12/23/20   [provider]  promethazine (PHENERGAN) 25 MG tablet Take 12.5 mg by mouth every 6 (six) hours as needed for nausea or vomiting. 11/24/20   [provider]  propranolol (INDERAL) 10 MG tablet Take 10 mg by mouth 2 (two) times daily. 01/20/21   [provider]  tiZANidine (ZANAFLEX) 2 MG tablet Take 2 mg by mouth 2 (two) times daily as needed for muscle spasms. 02/15/21   [provider]  traZODone (DESYREL) 50 MG tablet Take 50 mg by mouth at bedtime as needed for sleep. 05/23/17   [provider]    Inpatient Medications:  Current Facility-Administered Medications:    0.9 %  sodium chloride infusion (Manually program via Guardrails IV Fluids), , Intravenous, Once, Rancour, SAnnie Main MD, Held at 06/10/21 0336    [COMPLETED] sodium chloride 0.9 % bolus 1,000 mL, 1,000 mL, Intravenous, Once, Stopped at 06/10/21 0400 **AND** 0.9 %  sodium chloride infusion, , Intravenous, Continuous, Rancour, SAnnie Main MD, Held at 06/10/21 [phone]  acetaminophen (TYLENOL) tablet 650 mg, 650 mg, Oral, Q6H PRN **OR** acetaminophen (TYLENOL) suppository 650 mg, 650 mg, Rectal, Q6H PRN, Reubin Milan, MD   fentaNYL (SUBLIMAZE) injection 50 mcg, 50 mcg, Intravenous, Once, Rancour, Stephen, MD   HYDROmorphone (DILAUDID) injection 0.25-0.5 mg, 0.25-0.5 mg, Intravenous, Q3H PRN, Wynetta Emery, Clanford L, MD, 0.25 mg at 06/10/21 0845   ipratropium-albuterol (DUONEB) 0.5-2.5 (3) MG/3ML nebulizer solution 3 mL, 3 mL, Nebulization, Q4H PRN, Johnson, Clanford L, MD   metoCLOPramide (REGLAN) injection 10 mg, 10 mg, Intravenous, Once, Montez Morita, Crescencio Jozwiak, MD   nicotine (NICODERM CQ - dosed in mg/24 hours) patch 21 mg, 21 mg, Transdermal, Daily, Johnson, Clanford L, MD, 21 mg at 06/10/21 0906   ondansetron (ZOFRAN) tablet 4 mg, 4 mg, Oral, Q6H PRN **OR** ondansetron (ZOFRAN) injection 4 mg, 4 mg, Intravenous, Q6H PRN, Reubin Milan, MD   pantoprozole (PROTONIX) 80 mg /NS 100 mL infusion, 8 mg/hr, Intravenous, Continuous, Rancour, Stephen, MD, Last Rate: 10 mL/hr at 06/10/21 0847, 8 mg/hr at 06/10/21 0847 Allergies: Penicillins, Clarithromycin, Moxifloxacin, Quinolones, Salicylates, Lactose intolerance (gi), Lactulose, Aspirin, Buprenorphine hcl, Ibuprofen, Morphine and related, and Sulfa antibiotics  Complete Review of Systems: GENERAL: negative for malaise, night sweats HEENT: No changes in hearing or vision, no nose bleeds or other nasal problems. NECK: Negative for lumps, goiter, pain and significant neck swelling RESPIRATORY: Negative for cough, wheezing CARDIOVASCULAR: Negative for chest pain, leg swelling, palpitations, orthopnea GI: SEE HPI MUSCULOSKELETAL: Negative for joint pain or swelling, back pain, and muscle  pain. SKIN: Negative for lesions, rash PSYCH: Negative for sleep disturbance, mood disorder and recent psychosocial stressors. HEMATOLOGY Negative for prolonged bleeding, bruising easily, and swollen nodes. ENDOCRINE: Negative for cold or heat intolerance, polyuria, polydipsia and goiter. NEURO: negative for tremor, gait imbalance, syncope and seizures. The remainder of the review of systems is noncontributory.  Physical Exam: BP 115/62   Pulse 84   Temp 98.7 F (37.1 C) (Oral)   Resp 20   Ht '5\' 4"'  (1.626 m)   Wt 74.8 kg   SpO2 98%   BMI 28.32 kg/m  GENERAL: The patient is AO x3, in no acute distress. HEENT: Head is normocephalic and atraumatic. EOMI are intact. Mouth is well hydrated and without lesions. NECK: Supple. No masses LUNGS: Clear to auscultation. No presence of rhonchi/wheezing/rales. Adequate chest expansion HEART: RRR, normal s1 and s2. ABDOMEN: mildly tender in the epigastric and hypogastric area, no guarding, no peritoneal signs, and nondistended. BS +. No masses. RECTAL EXAM: no external lesions, normal tone, no masses, no stool in rectal vault. EXTREMITIES: Without any cyanosis, clubbing, rash, lesions or edema. NEUROLOGIC: AOx3, no focal motor deficit. SKIN: no jaundice, no rashes  Laboratory Data CBC:     Component Value Date/Time   WBC 12.0 (H) 06/10/2021 0232   RBC 3.23 (L) 06/10/2021 0232   HGB 7.8 (L) 06/10/2021 0232   HCT 26.4 (L) 06/10/2021 0232   PLT 374 06/10/2021 0232   MCV 81.7 06/10/2021 0232   MCH 24.1 (L) 06/10/2021 0232   MCHC 29.5 (L) 06/10/2021 0232   RDW 23.4 (H) 06/10/2021 0232   LYMPHSABS 1.5 06/10/2021 0232   MONOABS 1.3 (H) 06/10/2021 0232   EOSABS 0.3 06/10/2021 0232   BASOSABS 0.1 06/10/2021 0232   COAG:  Lab Results  Component Value Date   INR 0.9 06/10/2021   INR 0.92 07/30/2016   INR 0.83 11/11/2010    BMP:  BMP Latest Ref Rng & Units 06/10/2021 02/23/2021 02/22/2021  Glucose 70 -  99 mg/dL 100(H) 84 115(H)  BUN 8 - 23  mg/dL '13 8 18  ' Creatinine 0.44 - 1.00 mg/dL 0.44 0.41(L) 0.43(L)  Sodium 135 - 145 mmol/L 139 141 141  Potassium 3.5 - 5.1 mmol/L 3.6 4.0 3.8  Chloride 98 - 111 mmol/L 109 105 104  CO2 22 - 32 mmol/L 27 31 33(H)  Calcium 8.9 - 10.3 mg/dL 8.0(L) 8.6(L) 8.9    HEPATIC:  Hepatic Function Latest Ref Rng & Units 06/10/2021 02/01/2020 01/04/2020  Total Protein 6.5 - 8.1 g/dL 5.2(L) 6.9 5.9(L)  Albumin 3.5 - 5.0 g/dL 2.9(L) 4.0 3.3(L)  AST 15 - 41 U/L '16 26 29  ' ALT 0 - 44 U/L '21 20 24  ' Alk Phosphatase 38 - 126 U/L 63 87 91  Total Bilirubin 0.3 - 1.2 mg/dL 0.1(L) 0.3 0.3  Bilirubin, Direct 0.0 - 0.3 mg/dL - - -    CARDIAC:  Lab Results  Component Value Date   TROPONINI <0.30 06/09/2013     Imaging: I personally reviewed and interpreted the available imaging.  Assessment & Plan: TINESHA SIEGRIST is a 60 y.o. female with history of ADHD, asthma, multiple bowel surgeries including small bowel resection complicated by recurrent episodes of small bowel obstruction due to adhesions, PTSD, depression, GERD, large hiatal hernia, chronic back pain due to spinal disease on opiate management, COPD, previous episodes of C. difficile colitis, who came to the hospital after she presented episodes of coffee-ground emesis and melena.  The patient has presented changes concerning for an upper gastrointestinal bleeding.  She had similar symptoms in May but no source was found at that time.  However, this time she has presented significant drop in her hemoglobin for which she required to be transfused.  We will need to proceed with an EGD +/- enteroscopy emergently today to evaluate for sources of gastrointestinal bleeding such as peptic ulcer disease, Dieulafoy lesion or AVMs.  It is also possible that she has Lysbeth Galas lesions due to the size of her hiatal hernia.  The patient understood and agreed.  We will give her 1 dose of Reglan and continue her PPI drip for now.  - Repeat CBC qday, transfuse if Hb <7 -  Pantoprazole ggt - 2 large bore IV lines - Active T/S - Keep NPO - Avoid NSAIDs - Will proceed with EGD today, possible enteroscopy - Reglan 10 mg once  Harvel Quale, MD Gastroenterology and Hepatology [facility]

## 2021-06-11 DIAGNOSIS — R110 Nausea: Secondary | ICD-10-CM

## 2021-06-11 LAB — MAGNESIUM: Magnesium: 2.4 mg/dL (ref 1.7–2.4)

## 2021-06-11 LAB — TYPE AND SCREEN
ABO/RH(D): A POS
Antibody Screen: NEGATIVE
Unit division: 0
Unit division: 0

## 2021-06-11 LAB — COMPREHENSIVE METABOLIC PANEL
ALT: 21 U/L (ref 0–44)
AST: 20 U/L (ref 15–41)
Albumin: 3.4 g/dL — ABNORMAL LOW (ref 3.5–5.0)
Alkaline Phosphatase: 70 U/L (ref 38–126)
Anion gap: 5 (ref 5–15)
BUN: 6 mg/dL — ABNORMAL LOW (ref 8–23)
CO2: 31 mmol/L (ref 22–32)
Calcium: 9.1 mg/dL (ref 8.9–10.3)
Chloride: 104 mmol/L (ref 98–111)
Creatinine, Ser: 0.39 mg/dL — ABNORMAL LOW (ref 0.44–1.00)
GFR, Estimated: >60 mL/min (ref >60)
Glucose, Bld: 107 mg/dL — ABNORMAL HIGH (ref 70–99)
Potassium: 4.1 mmol/L (ref 3.5–5.1)
Sodium: 140 mmol/L (ref 135–145)
Total Bilirubin: 0.3 mg/dL (ref 0.3–1.2)
Total Protein: 6.3 g/dL — ABNORMAL LOW (ref 6.5–8.1)

## 2021-06-11 LAB — CBC
HCT: 39.4 % (ref 36.0–46.0)
Hemoglobin: 11.8 g/dL — ABNORMAL LOW (ref 12.0–15.0)
MCH: 24.8 pg — ABNORMAL LOW (ref 26.0–34.0)
MCHC: 29.9 g/dL — ABNORMAL LOW (ref 30.0–36.0)
MCV: 82.9 fL (ref 80.0–100.0)
Platelets: 425 10*3/uL — ABNORMAL HIGH (ref 150–400)
RBC: 4.75 MIL/uL (ref 3.87–5.11)
RDW: 22.3 % — ABNORMAL HIGH (ref 11.5–15.5)
WBC: 7.1 10*3/uL (ref 4.0–10.5)
nRBC: 0 % (ref 0.0–0.2)

## 2021-06-11 LAB — BPAM RBC
Blood Product Expiration Date: 202209292359
Blood Product Expiration Date: 202209302359
ISSUE DATE / TIME: 202209030407
ISSUE DATE / TIME: 202209031513
Unit Type and Rh: 6200
Unit Type and Rh: 6200

## 2021-06-11 MED ORDER — POLYETHYLENE GLYCOL 3350 17 G PO PACK
17.0000 g | PACK | Freq: Three times a day (TID) | ORAL | Status: DC
  Administered 2021-06-11 – 2021-06-12 (×3): 17 g via ORAL
  Filled 2021-06-11 (×4): qty 1

## 2021-06-11 NOTE — Progress Notes (Addendum)
[name], M.D. Gastroenterology & Hepatology   Interval History:  No acute events ON. Patient reports feeling well.  Said that she still has some discomfort in her abdomen in the epigastric area.  Has not move her bowels since yesterday.  Underwent EGD that showed presence of a large hiatal hernia extending 6 cm, with presence of Cameron ulcers without active bleeding, there were some gastric erosions and biopsies were taken.  No presence of hematin or stigmata of recent bleeding were seen anywhere else. Her hemoglobin has remained stable and actually has increased today to 11.8.  Has tolerated clear liquid diet and denies having any complaints such as nausea or vomiting.  Inpatient Medications:  Current Facility-Administered Medications:    0.9 %  sodium chloride infusion (Manually program via Guardrails IV Fluids), , Intravenous, Once, Rancour, Stephen, MD, Held at 06/10/21 [phone]   [COMPLETED] sodium chloride 0.9 % bolus 1,000 mL, 1,000 mL, Intravenous, Once, Stopped at 06/10/21 0400 **AND** 0.9 %  sodium chloride infusion, , Intravenous, Continuous, Johnson, Clanford L, MD, Last Rate: 30 mL/hr at 06/10/21 1850, Rate Change at 06/10/21 1850   acetaminophen (TYLENOL) tablet 650 mg, 650 mg, Oral, Q6H PRN **OR** acetaminophen (TYLENOL) suppository 650 mg, 650 mg, Rectal, Q6H PRN, Reubin Milan, MD   albuterol (PROVENTIL) (2.5 MG/3ML) 0.083% nebulizer solution 2.5 mg, 2.5 mg, Nebulization, Q6H PRN, Johnson, Clanford L, MD, 2.5 mg at 06/10/21 1757   ALPRAZolam (XANAX) tablet 0.5 mg, 0.5 mg, Oral, TID PRN, Wynetta Emery, Clanford L, MD, 0.5 mg at 06/10/21 2120   ascorbic acid (VITAMIN C) tablet 500 mg, 500 mg, Oral, Daily, Johnson, Clanford L, MD, 500 mg at 06/10/21 1432   butalbital-acetaminophen-caffeine (FIORICET) 50-325-40 MG per tablet 1 tablet, 1 tablet, Oral, Q8H PRN, Wynetta Emery, Clanford L, MD, 1 tablet at 06/10/21 2120   Chlorhexidine Gluconate Cloth 2 % PADS 6 each, 6 each, Topical,  Daily, Johnson, Clanford L, MD, 6 each at 06/10/21 1444   DULoxetine (CYMBALTA) DR capsule 90 mg, 90 mg, Oral, Daily, Johnson, Clanford L, MD, 90 mg at 06/10/21 1549   gabapentin (NEURONTIN) capsule 400 mg, 400 mg, Oral, TID, Johnson, Clanford L, MD, 400 mg at 06/10/21 2117   HYDROmorphone (DILAUDID) injection 0.25-0.5 mg, 0.25-0.5 mg, Intravenous, Q3H PRN, Wynetta Emery, Clanford L, MD, 0.25 mg at 06/11/21 0608   ipratropium-albuterol (DUONEB) 0.5-2.5 (3) MG/3ML nebulizer solution 3 mL, 3 mL, Nebulization, Q4H PRN, Johnson, Clanford L, MD, 3 mL at 06/10/21 1035   nicotine (NICODERM CQ - dosed in mg/24 hours) patch 21 mg, 21 mg, Transdermal, Daily, Johnson, Clanford L, MD, 21 mg at 06/10/21 0906   OLANZapine (ZYPREXA) tablet 15 mg, 15 mg, Oral, QHS, Johnson, Clanford L, MD, 15 mg at 06/10/21 2118   ondansetron (ZOFRAN) tablet 4 mg, 4 mg, Oral, Q6H PRN **OR** ondansetron (ZOFRAN) injection 4 mg, 4 mg, Intravenous, Q6H PRN, Reubin Milan, MD, 4 mg at 06/10/21 2124   pantoprazole (PROTONIX) EC tablet 40 mg, 40 mg, Oral, BID, Montez Morita, Bena Kobel, MD, 40 mg at 06/10/21 2118   polyethylene glycol (MIRALAX / GLYCOLAX) packet 17 g, 17 g, Oral, TID, Montez Morita, Keayra Graham, MD   potassium chloride (KLOR-CON) CR tablet 30 mEq, 30 mEq, Oral, QHS, Johnson, Clanford L, MD, 30 mEq at 06/10/21 2118   propranolol (INDERAL) tablet 10 mg, 10 mg, Oral, BID, Johnson, Clanford L, MD, 10 mg at 06/10/21 2117   traZODone (DESYREL) tablet 50 mg, 50 mg, Oral, QHS PRN, Wynetta Emery, Clanford L, MD   zinc sulfate  capsule 220 mg, 220 mg, Oral, Daily, Wynetta Emery, Clanford L, MD   I/O    Intake/Output Summary (Last 24 hours) at 06/11/2021 1040 Last data filed at 06/11/2021 0648 Gross per 24 hour  Intake 1028.61 ml  Output 3400 ml  Net -2371.39 ml     Physical Exam: Temp:  [97.5 F (36.4 C)-99.1 F (37.3 C)] 97.9 F (36.6 C) (09/04 0757) Pulse Rate:  [66-96] 67 (09/04 0900) Resp:  [11-21] 13 (09/04 0900) BP:  (88-152)/(39-81) 110/58 (09/04 0900) SpO2:  [91 %-100 %] 100 % (09/04 0900) FiO2 (%):  [28 %] 28 % (09/03 1758) Weight:  [71.1 kg] 71.1 kg (09/04 0517)  Temp (24hrs), Avg:98.3 F (36.8 C), Min:97.5 F (36.4 C), Max:99.1 F (37.3 C) GENERAL: The patient is AO x3, in no acute distress. HEENT: Head is normocephalic and atraumatic. EOMI are intact. Mouth is well hydrated and without lesions. NECK: Supple. No masses LUNGS: Clear to auscultation. No presence of rhonchi/wheezing/rales. Adequate chest expansion HEART: RRR, normal s1 and s2. ABDOMEN: mildly tender upon palpation of the epigastric area, no guarding, no peritoneal signs, and nondistended. BS +. No masses. EXTREMITIES: Without any cyanosis, clubbing, rash, lesions or edema. NEUROLOGIC: AOx3, no focal motor deficit. SKIN: no jaundice, no rashes  Laboratory Data: CBC:     Component Value Date/Time   WBC 7.1 06/11/2021 0456   RBC 4.75 06/11/2021 0456   HGB 11.8 (L) 06/11/2021 0456   HCT 39.4 06/11/2021 0456   PLT 425 (H) 06/11/2021 0456   MCV 82.9 06/11/2021 0456   MCH 24.8 (L) 06/11/2021 0456   MCHC 29.9 (L) 06/11/2021 0456   RDW 22.3 (H) 06/11/2021 0456   LYMPHSABS 1.5 06/10/2021 0232   MONOABS 1.3 (H) 06/10/2021 0232   EOSABS 0.3 06/10/2021 0232   BASOSABS 0.1 06/10/2021 0232   COAG:  Lab Results  Component Value Date   INR 0.9 06/10/2021   INR 0.92 07/30/2016   INR 0.83 11/11/2010    BMP:  BMP Latest Ref Rng & Units 06/11/2021 06/10/2021 02/23/2021  Glucose 70 - 99 mg/dL 107(H) 100(H) 84  BUN 8 - 23 mg/dL 6(L) 13 8  Creatinine 0.44 - 1.00 mg/dL 0.39(L) 0.44 0.41(L)  Sodium 135 - 145 mmol/L 140 139 141  Potassium 3.5 - 5.1 mmol/L 4.1 3.6 4.0  Chloride 98 - 111 mmol/L 104 109 105  CO2 22 - 32 mmol/L _0 Calcium 8.9 - 10.3 mg/dL 9.1 8.0(L) 8.6(L)    HEPATIC:  Hepatic Function Latest Ref Rng & Units 06/11/2021 06/10/2021 02/01/2020  Total Protein 6.5 - 8.1 g/dL 6.3(L) 5.2(L) 6.9  Albumin 3.5 - 5.0 g/dL 3.4(L)  2.9(L) 4.0  AST 15 - 41 U/L _1 ALT 0 - 44 U/L _2 Alk Phosphatase 38 - 126 U/L 70 63 87  Total Bilirubin 0.3 - 1.2 mg/dL 0.3 0.1(L) 0.3  Bilirubin, Direct 0.0 - 0.3 mg/dL - - -    CARDIAC:  Lab Results  Component Value Date   TROPONINI <0.30 06/09/2013      Imaging: I personally reviewed and interpreted the available labs, imaging and endoscopic files.   Assessment/Plan: DARBI CHANDRAN is a 60 y.o. female with history of ADHD, asthma, multiple bowel surgeries including small bowel resection complicated by recurrent episodes of small bowel obstruction due to adhesions, PTSD, depression, GERD, large hiatal hernia, chronic back pain due to spinal disease on opiate management, COPD, previous episodes of C. difficile colitis, who came to the hospital  after she presented episodes of coffee-ground emesis and melena.  The patient presented symptoms concerning for an upper gastrointestinal bleeding.  She had similar symptoms in May but no source was found at that time.  However, this time she has presented significant drop in her hemoglobin for which she required to be transfused.  She underwent an EGD that showed large hiatal hernia extending 6 cm, with presence of Cameron ulcers without active bleeding, there were some gastric erosions and biopsies were taken.  No presence of hematin or stigmata of recent bleeding were seen anywhere else.  It is very likely her bleeding episodes were related to her common ulcers.  At this point, she has remained hemodynamically stable without any signs of further bleeding.  We will recommend advancing her diet to GI soft and starting her on a laxative regimen.  She will need to continue taking PPI twice a day, but she will need to be evaluated as outpatient and [facility] for possible hiatal hernia correction.  The patient understood and agreed.   - Repeat CBC qday, transfuse if Hb <7 - Pantoprazole 40 mg PO BID - 2 large bore IV lines -  Active T/S - Advance diet to GI soft - Avoid NSAIDs - Will refer her to [facility] for outpatient evaluation and possible hiatal hernia repair - Follow path results, treat if H. Pylori positive - GI service will sign-off, please call us back if you have any more questions.   Harvel Quale, MD Gastroenterology and Hepatology [facility]

## 2021-06-11 NOTE — Progress Notes (Signed)
PROGRESS NOTE   [name]  [id number] DOB: 02/12/1961 DOA: 06/10/2021 PCP: Neale Burly, MD   Chief Complaint  Patient presents with   Abdominal Pain   Melena   Level of care: Telemetry  Brief Admission History:  60 y.o. female with medical history significant asthma, GERD, PUD, IBS,  COPD, angina, ADHD and other history detailed below presents to ED with complaint of ongoing abdominal pain and black stools.  She was recently discharged from [facility] after a 2 day admission for a bowel obstruction.  She had been doing well until she started having abdominal pain and four episodes of black stool that started in the last 24 hours.  She has had vomiting and noticed small amounts of blood in the vomit.  She has 7/10 pain across her entire abdomen.  She reports dizziness and light-headedness.  She noted that she went to ED at [facility] yesterday but ended up going home.   She has no chest pain or SOB.     ED Course: Pt presented with soft BPs SBP in 80s. She was noted to have a Hg of 7.8 down from 10.2.  IV protonix started.  2 units PRBC ordered.  IV fluid and IV pain med bolused.  She complained of severe abdominal pain and abdominal distension and CT abdomen shows hiatal hernia with no evidence of obstruction and no changes from CTA that had been done yesterday at outside hospital.  GI was consulted and admission was requested.    Assessment & Plan:   Principal Problem:   Gastrointestinal hemorrhage with melena Active Problems:   Depression with anxiety   Asthma   GERD   Peptic ulcer disease   IBS   NECK PAIN, CHRONIC   HX, PERSONAL, PAST NONCOMPLIANCE   Chronic nausea   Hx of adenomatous colonic polyps   Tobacco abuse   COPD (chronic obstructive pulmonary disease) (HCC)   Chronic pain syndrome   Iron deficiency anemia   Acute blood loss anemia   Hypotension   GI hemorrhage  Acute upper GI hemorrhage-patient had a EGD on 06/10/2021 with findings of large hiatal  hernia and Cameron lesions and multiple ulcerations but no bleeding was seen.  Biopsies taken.  Patient remains on twice daily Protonix therapy and advance to soft diet by GI team.  Hemoglobin has improved to 11.8 after 2 units PRBC transfusion.  Hypotension-resolved with PRBC transfusion and IV fluids.  Nausea and vomiting-IV medications ordered.  Constipation-GI team increased MiraLAX to 3 times daily.  Tobacco and COPD-nicotine patch ordered bronchodilators ordered as needed.  Counseled on the importance of smoking cessation at bedside.  Patient verbalized understanding.  Chronic pain syndrome - IV pain medication ordered.  Large hiatal hernia-GI referring patient to [facility] surgery for repair.  Depression anxiety-Home meds have been resumed.  DVT prophylaxis: SCDs Code Status: Full Family Communication: Plan of care discussed with patient at bedside Disposition: The patient DC home 06/12/2021 Status is: Inpatient  Remains inpatient appropriate because:Inpatient level of care appropriate due to severity of illness  Dispo: The patient is from: Home              Anticipated d/c is to: Home              Patient currently is not medically stable to d/c.   Difficult to place patient No   Consultants:  GI  Procedures:  EGD 06/10/21 POST-OPERATIVE DIAGNOSIS:  Lysbeth Galas lesions;gastric erosions;37-43 hiatal hernia;  Antimicrobials:   Subjective:  Pt reports that she is tolerating clear diet well.  No further emesis, no BM yet.  No abdominal pain.   Objective: Vitals:   06/11/21 0600 06/11/21 0757 06/11/21 0900 06/11/21 1117  BP: 112/60  (!) 110/58 120/62  Pulse: 72  67 78  Resp: 13  13   Temp:  97.9 F (36.6 C)    TempSrc:  Axillary    SpO2: 98%  100%   Weight:      Height:        Intake/Output Summary (Last 24 hours) at 06/11/2021 1214 Last data filed at 06/11/2021 0648 Gross per 24 hour  Intake 528.61 ml  Output 3400 ml  Net -2871.39 ml   Filed Weights    06/10/21 0152 06/11/21 0517  Weight: 74.8 kg 71.1 kg    Examination:  General exam: Appears calm and comfortable  Respiratory system: Clear to auscultation. Respiratory effort normal. Cardiovascular system: normal S1 & S2 heard. No JVD, murmurs, rubs, gallops or clicks. No pedal edema. Gastrointestinal system: Abdomen is nondistended, soft and nontender. No organomegaly or masses felt. Normal bowel sounds heard. Central nervous system: Alert and oriented. No focal neurological deficits. Extremities: Symmetric 5 x 5 power. Skin: No rashes, lesions or ulcers Psychiatry: Judgement and insight appear normal. Mood & affect appropriate.   Data Reviewed: I have personally reviewed following labs and imaging studies  CBC: Recent Labs  Lab 06/10/21 0232 06/10/21 0945 06/11/21 0456  WBC 12.0*  --  7.1  NEUTROABS 8.7*  --   --   HGB 7.8* 9.8* 11.8*  HCT 26.4* 32.7* 39.4  MCV 81.7  --  82.9  PLT 374  --  425*    Basic Metabolic Panel: Recent Labs  Lab 06/10/21 0232 06/11/21 0456  NA 139 140  K 3.6 4.1  CL 109 104  CO2 27 31  GLUCOSE 100* 107*  BUN 13 6*  CREATININE 0.44 0.39*  CALCIUM 8.0* 9.1  MG  --  2.4    GFR: Estimated Creatinine Clearance: 71.5 mL/min (A) (by C-G formula based on SCr of 0.39 mg/dL (L)).  Liver Function Tests: Recent Labs  Lab 06/10/21 0232 06/11/21 0456  AST 16 20  ALT 21 21  ALKPHOS 63 70  BILITOT 0.1* 0.3  PROT 5.2* 6.3*  ALBUMIN 2.9* 3.4*    CBG: No results for input(s): GLUCAP in the last 168 hours.  Recent Results (from the past 240 hour(s))  Resp Panel by RT-PCR (Flu A&B, Covid) Nasopharyngeal Swab     Status: None   Collection Time: 06/10/21  3:23 AM   Specimen: Nasopharyngeal Swab; Nasopharyngeal(NP) swabs in vial transport medium  Result Value Ref Range Status   SARS Coronavirus 2 by RT PCR NEGATIVE NEGATIVE Final    Comment: (NOTE) SARS-CoV-2 target nucleic acids are NOT DETECTED.  The SARS-CoV-2 RNA is generally  detectable in upper respiratory specimens during the acute phase of infection. The lowest concentration of SARS-CoV-2 viral copies this assay can detect is 138 copies/mL. A negative result does not preclude SARS-Cov-2 infection and should not be used as the sole basis for treatment or other patient management decisions. A negative result may occur with  improper specimen collection/handling, submission of specimen other than nasopharyngeal swab, presence of viral mutation(s) within the areas targeted by this assay, and inadequate number of viral copies(<138 copies/mL). A negative result must be combined with clinical observations, patient history, and epidemiological information. The expected result is Negative.  Fact Sheet for Patients:  EntrepreneurPulse.com.au  Fact  Sheet for Healthcare Providers:  IncredibleEmployment.be  This test is no t yet approved or cleared by the [place] FDA and  has been authorized for detection and/or diagnosis of SARS-CoV-2 by FDA under an Emergency Use Authorization (EUA). This EUA will remain  in effect (meaning this test can be used) for the duration of the COVID-19 declaration under Section 564(b)(1) of the Act, 21 U.S.C.section 360bbb-3(b)(1), unless the authorization is terminated  or revoked sooner.       Influenza A by PCR NEGATIVE NEGATIVE Final   Influenza B by PCR NEGATIVE NEGATIVE Final    Comment: (NOTE) The Xpert Xpress SARS-CoV-2/FLU/RSV plus assay is intended as an aid in the diagnosis of influenza from Nasopharyngeal swab specimens and should not be used as a sole basis for treatment. Nasal washings and aspirates are unacceptable for Xpert Xpress SARS-CoV-2/FLU/RSV testing.  Fact Sheet for Patients: EntrepreneurPulse.com.au  Fact Sheet for Healthcare Providers: IncredibleEmployment.be  This test is not yet approved or cleared by the [place] FDA  and has been authorized for detection and/or diagnosis of SARS-CoV-2 by FDA under an Emergency Use Authorization (EUA). This EUA will remain in effect (meaning this test can be used) for the duration of the COVID-19 declaration under Section 564(b)(1) of the Act, 21 U.S.C. section 360bbb-3(b)(1), unless the authorization is terminated or revoked.  Performed at [facility], [address], [place], [place] [address]   MRSA Next Gen by PCR, Nasal     Status: None   Collection Time: 06/10/21 12:00 PM   Specimen: Nasal Mucosa; Nasal Swab  Result Value Ref Range Status   MRSA by PCR Next Gen NOT DETECTED NOT DETECTED Final    Comment: (NOTE) The GeneXpert MRSA Assay (FDA approved for NASAL specimens only), is one component of a comprehensive MRSA colonization surveillance program. It is not intended to diagnose MRSA infection nor to guide or monitor treatment for MRSA infections. Test performance is not FDA approved in patients less than 23 years old. Performed at [facility] [address] [place], [place] [address]      Radiology Studies: CT ABDOMEN PELVIS W CONTRAST  Result Date: 06/10/2021 CLINICAL DATA:  Abdominal pain.  Recent small bowel obstruction EXAM: CT ABDOMEN AND PELVIS WITH CONTRAST TECHNIQUE: Multidetector CT imaging of the abdomen and pelvis was performed using the standard protocol following bolus administration of intravenous contrast. CONTRAST:  38m OMNIPAQUE IOHEXOL 350 MG/ML SOLN COMPARISON:  06/09/2021 [place] [facility] FINDINGS: Lower chest: Large hiatal hernia. Heart is normal size. No acute abnormality. Hepatobiliary: No focal hepatic abnormality. Gallbladder unremarkable. Pancreas: No focal abnormality or ductal dilatation. Spleen: No focal abnormality.  Normal size. Adrenals/Urinary Tract: No adrenal abnormality. No focal renal abnormality. No stones or hydronephrosis. Urinary bladder is unremarkable. Stomach/Bowel: Postoperative changes in the  sigmoid colon. Postoperative changes in small bowel within the left upper quadrant. No evidence of bowel obstruction. Vascular/Lymphatic: No evidence of aneurysm or adenopathy. Reproductive: Prior hysterectomy.  No adnexal masses. Other: No free fluid or free air. Musculoskeletal: No acute bony abnormality. Postoperative changes in the lumbar spine from posterior fusion. IMPRESSION: Large hiatal hernia. No evidence of bowel obstruction. No change from recent CTA abdomen/pelvis. Electronically Signed   By: KRolm BaptiseM.D.   On: 06/10/2021 03:58   DG CHEST PORT 1 VIEW  Result Date: 06/10/2021 CLINICAL DATA:  Shortness of breath EXAM: PORTABLE CHEST 1 VIEW COMPARISON:  06/01/2021 FINDINGS: Right Port-A-Cath remains in place, unchanged. Heart is normal size. Areas of scarring throughout  the lungs. No acute confluent opacities or effusions. No acute bony abnormality. IMPRESSION: Chronic changes.  No active disease. Electronically Signed   By: Rolm Baptise M.D.   On: 06/10/2021 19:46    Scheduled Meds:  sodium chloride   Intravenous Once   ascorbic acid  500 mg Oral Daily   Chlorhexidine Gluconate Cloth  6 each Topical Daily   DULoxetine  90 mg Oral Daily   gabapentin  400 mg Oral TID   nicotine  21 mg Transdermal Daily   OLANZapine  15 mg Oral QHS   pantoprazole  40 mg Oral BID   polyethylene glycol  17 g Oral TID   potassium chloride  30 mEq Oral QHS   propranolol  10 mg Oral BID   zinc sulfate  220 mg Oral Daily   Continuous Infusions:  sodium chloride 30 mL/hr at 06/10/21 1850    LOS: 1 day   Time spent: 60 mins  Dimetrius Montfort Wynetta Emery, MD How to contact the [facility] Attending or Consulting provider [place] or covering provider during after hours [place], for this patient?  Check the care team in [facility] and look for a) attending/consulting [facility] provider listed and b) the [facility] team listed Log into [email or web address] and use [place]'s universal password to access. If you do not have the password, please  contact the hospital operator. Locate the [facility] provider you are looking for under [facility] and page to a number that you can be directly reached. If you still have difficulty reaching the provider, please page the [facility] (Director on Call) for the Hospitalists listed on amion for assistance.  06/11/2021, 12:14 PM

## 2021-06-11 NOTE — Progress Notes (Signed)
Patient has had one small emesis episode around 1210pm after drinking a cup of coffee. Emesis brown in color and smelled like coffee. PRN Zofran given with good effect. Dr Wynetta Emery and Dr Jenetta Downer made aware. No further complaints of nausea or vomiting noted.

## 2021-06-12 DIAGNOSIS — K922 Gastrointestinal hemorrhage, unspecified: Secondary | ICD-10-CM

## 2021-06-12 LAB — CBC
HCT: 40.7 % (ref 36.0–46.0)
Hemoglobin: 11.9 g/dL — ABNORMAL LOW (ref 12.0–15.0)
MCH: 24.6 pg — ABNORMAL LOW (ref 26.0–34.0)
MCHC: 29.2 g/dL — ABNORMAL LOW (ref 30.0–36.0)
MCV: 84.1 fL (ref 80.0–100.0)
Platelets: 438 10*3/uL — ABNORMAL HIGH (ref 150–400)
RBC: 4.84 MIL/uL (ref 3.87–5.11)
RDW: 22.5 % — ABNORMAL HIGH (ref 11.5–15.5)
WBC: 7 10*3/uL (ref 4.0–10.5)
nRBC: 0 % (ref 0.0–0.2)

## 2021-06-12 MED ORDER — LACTATED RINGERS IV BOLUS: 500.0000 mL | Freq: Once | INTRAVENOUS | Status: DC

## 2021-06-12 MED ORDER — PROPRANOLOL HCL 20 MG PO TABS: 10.0000 mg | ORAL_TABLET | Freq: Two times a day (BID) | ORAL | Status: DC

## 2021-06-12 MED ORDER — HEPARIN SOD (PORK) LOCK FLUSH 100 UNIT/ML IV SOLN
500.0000 [IU] | Freq: Once | INTRAVENOUS | Status: AC
  Administered 2021-06-12: 500 [IU] via INTRAVENOUS
  Filled 2021-06-12: qty 5

## 2021-06-12 MED ORDER — PANTOPRAZOLE SODIUM 40 MG PO TBEC
40.0000 mg | DELAYED_RELEASE_TABLET | Freq: Two times a day (BID) | ORAL | 1 refills | Status: DC
Start: 2021-06-12 — End: 2022-03-01

## 2021-06-12 MED ORDER — PANTOPRAZOLE SODIUM 40 MG PO TBEC: 40.0000 mg | DELAYED_RELEASE_TABLET | Freq: Every day | ORAL | 1 refills | Status: DC

## 2021-06-12 MED ORDER — POLYETHYLENE GLYCOL 3350 17 G PO PACK: 17.0000 g | PACK | Freq: Three times a day (TID) | ORAL | 1 refills | Status: AC

## 2021-06-12 MED ORDER — PROPRANOLOL HCL 10 MG PO TABS: 5.0000 mg | ORAL_TABLET | Freq: Two times a day (BID) | ORAL | Status: AC

## 2021-06-12 NOTE — Evaluation (Signed)
Physical Therapy Evaluation Patient Details Name: [name] MRN: [id number] DOB: [birth year] Today's Date: 06/12/2021   History of Present Illness  [name] is a 60 y.o. female with medical history significant asthma, GERD, PUD, IBS,  COPD, angina, ADHD and other history detailed below presents to ED with complaint of ongoing abdominal pain and black stools.  She was recently discharged from [facility] after a 2 day admission for a bowel obstruction.  She had been doing well until she started having abdominal pain and four episodes of black stool that started in the last 24 hours.  She has had vomiting and noticed small amounts of blood in the vomit.  She has 7/10 pain across her entire abdomen.  She reports dizziness and light-headedness.  She noted that she went to ED at [facility] yesterday but ended up going home.   She has no chest pain or SOB.   Clinical Impression  Patient functioning near baseline for mobility and gait. Patient independent with bed mobility and tranfers. Patient does demonstrate minimal unsteadiness with ambulation without loss of balance. Patient completes transfers and ambulation without AD. Patient completes session on room air with O2 sat >92% throughout. Patient discharged to care of nursing for ambulation daily as tolerated for length of stay.     Follow Up Recommendations No PT follow up    Equipment Recommendations  None recommended by PT    Recommendations for Other Services       Precautions / Restrictions Precautions Precautions: None Restrictions Weight Bearing Restrictions: No      Mobility  Bed Mobility Overal bed mobility: Independent                  Transfers Overall transfer level: Independent Equipment used: None                Ambulation/Gait Ambulation/Gait assistance: Counsellor (Feet): 100 Feet Assistive device: None Gait Pattern/deviations: Step-through pattern     General Gait Details:  slighlty unsteady at times without loss of balance  Stairs            Wheelchair Mobility    Modified Rankin (Stroke Patients Only)       Balance Overall balance assessment: Independent                                           Pertinent Vitals/Pain Pain Assessment: 0-10 Pain Score: 5  Pain Location: abdominal Pain Descriptors / Indicators: Aching Pain Intervention(s): Limited activity within patient's tolerance;Monitored during session    Home Living Family/patient expects to be discharged to:: Private residence Living Arrangements: Alone   Type of Home: Apartment Home Access: Level entry     Home Layout: One level Home Equipment: None      Prior Function Level of Independence: Independent         Comments: Patient states community ambulator without AD, independent with ADL     Hand Dominance        Extremity/Trunk Assessment   Upper Extremity Assessment Upper Extremity Assessment: Overall WFL for tasks assessed    Lower Extremity Assessment Lower Extremity Assessment: Overall WFL for tasks assessed    Cervical / Trunk Assessment Cervical / Trunk Assessment: Normal  Communication   Communication: No difficulties  Cognition Arousal/Alertness: Awake/alert Behavior During Therapy: WFL for tasks assessed/performed Overall Cognitive Status: Within Functional Limits for tasks assessed  General Comments      Exercises     Assessment/Plan    PT Assessment Patent does not need any further PT services  PT Problem List Decreased mobility       PT Treatment Interventions      PT Goals (Current goals can be found in the Care Plan section)  Acute Rehab PT Goals Patient Stated Goal: return home PT Goal Formulation: With patient Time For Goal Achievement: 06/12/21 Potential to Achieve Goals: Good    Frequency     Barriers to discharge        Co-evaluation                AM-PAC PT "6 Clicks" Mobility  Outcome Measure Help needed turning from your back to your side while in a flat bed without using bedrails?: None Help needed moving from lying on your back to sitting on the side of a flat bed without using bedrails?: None Help needed moving to and from a bed to a chair (including a wheelchair)?: None Help needed standing up from a chair using your arms (e.g., wheelchair or bedside chair)?: None Help needed to walk in hospital room?: None Help needed climbing 3-5 steps with a railing? : A Little 6 Click Score: 23    End of Session   Activity Tolerance: Patient tolerated treatment well Patient left: in bed;with call bell/phone within reach Nurse Communication: Mobility status PT Visit Diagnosis: Unsteadiness on feet (R26.81);Other abnormalities of gait and mobility (R26.89)    Time: [id number] PT Time Calculation (min) (ACUTE ONLY): 12 min   Charges:   PT Evaluation $PT Eval Low Complexity: 1 Low         10:10 AM, 06/12/21 Mearl Latin PT, DPT Physical Therapist at [facility]

## 2021-06-12 NOTE — Discharge Summary (Signed)
Physician Discharge Summary  IVETH HUTCHISON [id number] DOB: 26-Jul-1961 DOA: 06/10/2021  PCP: Neale Burly, MD GI: [facility]   Admit date: 06/10/2021 Discharge date: 06/12/2021  Admitted From:  Home  Disposition: Home   Recommendations for Outpatient Follow-up:  Follow up with PCP in 1 weeks Follow up with GI in 1 month Follow up with [facility] [place] [facility] regarding hiatal hernia repair Please follow up on the following pending results:  EGD biopsy results with GI service  Discharge Condition: STABLE   CODE STATUS: FULL DIET: soft foods recommended low acidity   Brief Hospitalization Summary: Please see all hospital notes, images, labs for full details of the hospitalization. ADMISSION HPI:    [name] is a 60 y.o. female with medical history significant asthma, GERD, PUD, IBS,  COPD, angina, ADHD and other history detailed below presents to ED with complaint of ongoing abdominal pain and black stools.  She was recently discharged from [facility] after a 2 day admission for a bowel obstruction.  She had been doing well until she started having abdominal pain and four episodes of black stool that started in the last 24 hours.  She has had vomiting and noticed small amounts of blood in the vomit.  She has 7/10 pain across her entire abdomen.  She reports dizziness and light-headedness.  She noted that she went to ED at [facility] yesterday but ended up going home.   She has no chest pain or SOB.     ED Course: Pt presented with soft BPs SBP in 80s. She was noted to have a Hg of 7.8 down from 10.2.  IV protonix started.  2 units PRBC ordered.  IV fluid and IV pain med bolused.  She complained of severe abdominal pain and abdominal distension and CT abdomen shows hiatal hernia with no evidence of obstruction and no changes from CTA that had been done yesterday at outside hospital.  GI was consulted and admission was requested.   HOSPITAL COURSE  By Problem  Acute upper GI  hemorrhage-patient had a EGD on 06/10/2021 with findings of large hiatal hernia and Cameron lesions and multiple ulcerations but no bleeding was seen.  Biopsies taken.  Patient remains on twice daily Protonix therapy and advance to soft diet by GI team.  Hemoglobin has improved to 11.8 after 2 units PRBC transfusion.  Pt tolerating diet and Hg remained stable.  DC home today.  Follow up with GI outpatient regarding EGD biopsy results.     Hypotension-resolved with PRBC transfusion and IV fluids.  Reduced propanolol dose by 50% to 5 mg BID   Nausea and vomiting-IV medications ordered. RESOLVED NOW.     Constipation-GI team increased MiraLAX to 3 times daily.  Continue.     Tobacco and COPD-nicotine patch ordered bronchodilators ordered as needed.  Counseled on the importance of smoking cessation at bedside.  Patient verbalized understanding.   Chronic pain syndrome -resume home pain management regimen.    Large hiatal hernia-GI referring patient to [facility] surgery for repair.   Depression anxiety-Home meds have been resumed.   DVT prophylaxis: SCDs Code Status: Full Family Communication: Plan of care discussed with patient at bedside Disposition: DC home 06/12/2021    Discharge Diagnoses:  Principal Problem:   Gastrointestinal hemorrhage with melena Active Problems:   Depression with anxiety   Asthma   GERD   Peptic ulcer disease   IBS   NECK PAIN, CHRONIC   HX, PERSONAL, PAST NONCOMPLIANCE   Chronic  nausea   Hx of adenomatous colonic polyps   Tobacco abuse   COPD (chronic obstructive pulmonary disease) (HCC)   Chronic pain syndrome   Iron deficiency anemia   Acute blood loss anemia   Hypotension   GI hemorrhage   Discharge Instructions:  Allergies as of 06/12/2021       Reactions   Penicillins Anaphylaxis, Other (See Comments)   Childhood allergy  Has patient had a PCN reaction causing immediate rash, facial/tongue/throat swelling, SOB or lightheadedness with  hypotension: Yes Has patient had a PCN reaction causing severe rash involving mucus membranes or skin necrosis: No Has patient had a PCN reaction that required hospitalization Yes Has patient had a PCN reaction occurring within the last 10 years: No If all of the above answers are "NO", then may proceed with Cephalosporin use.   Clarithromycin Nausea And Vomiting   Moxifloxacin Nausea And Vomiting   Quinolones Nausea And Vomiting   Salicylates Nausea And Vomiting   Lactose Intolerance (gi) Diarrhea   Lactulose Diarrhea   Aspirin Other (See Comments)   Burning of stomach. REACTION: GI upset   Buprenorphine Hcl Itching, Other (See Comments)   Suboxone   Ibuprofen Nausea Only, Other (See Comments)   Severe heartburn, Upset stomach due to acid reflux   Morphine And Related Itching   Sulfa Antibiotics Itching        Medication List     STOP taking these medications    esomeprazole 40 MG capsule Commonly known as: NEXIUM   famotidine 20 MG tablet Commonly known as: PEPCID       TAKE these medications    albuterol (2.5 MG/3ML) 0.083% nebulizer solution Commonly known as: PROVENTIL Take 2.5 mg by nebulization every 6 (six) hours as needed for wheezing or shortness of breath.   albuterol 90 MCG/ACT inhaler Commonly known as: PROVENTIL,VENTOLIN Inhale 2 puffs into the lungs every 6 (six) hours as needed for wheezing or shortness of breath. For shortness of breath   ALPRAZolam 1 MG tablet Commonly known as: XANAX Take 0.5 mg by mouth See admin instructions. Take 1/2 tablet 3 times daily as needed for anxiety   ascorbic acid 500 MG tablet Commonly known as: VITAMIN C Take 1 tablet by mouth daily.   DULoxetine 60 MG capsule Commonly known as: CYMBALTA Take 60 mg by mouth daily. Take with 30 mg   DULoxetine 30 MG capsule Commonly known as: CYMBALTA Take 30 mg by mouth daily. Take with 60 mg   EXCEDRIN MIGRAINE PO Take 2 tablets by mouth daily as needed (migraine).    gabapentin 400 MG capsule Commonly known as: NEURONTIN Take 400 mg by mouth 3 (three) times daily.   HYDROcodone-acetaminophen 10-325 MG tablet Commonly known as: NORCO Take 1 tablet by mouth 4 (four) times daily as needed for moderate pain.   lidocaine 5 % Commonly known as: [place] onto the skin.   mirtazapine 15 MG tablet Commonly known as: REMERON Take 15 mg by mouth at bedtime as needed (for rest).   nicotine polacrilex 4 MG gum Commonly known as: NICORETTE Take 4 mg by mouth as needed for smoking cessation.   OLANZapine 15 MG tablet Commonly known as: ZYPREXA Take 15 mg by mouth at bedtime.   ondansetron 4 MG tablet Commonly known as: ZOFRAN Take 1 tablet (4 mg total) by mouth every 6 (six) hours as needed for nausea.   pantoprazole 40 MG tablet Commonly known as: PROTONIX Take 1 tablet (40 mg total) by mouth 2 (  two) times daily. What changed: additional instructions   polyethylene glycol 17 g packet Commonly known as: MIRALAX / GLYCOLAX Take 17 g by mouth 3 (three) times daily.   promethazine 25 MG tablet Commonly known as: PHENERGAN Take 12.5 mg by mouth every 6 (six) hours as needed for nausea or vomiting.   propranolol 10 MG tablet Commonly known as: INDERAL Take 0.5 tablets (5 mg total) by mouth 2 (two) times daily. What changed: how much to take   tiZANidine 2 MG tablet Commonly known as: ZANAFLEX Take 2 mg by mouth 2 (two) times daily as needed for muscle spasms.   traZODone 50 MG tablet Commonly known as: DESYREL Take 50 mg by mouth at bedtime as needed for sleep.   zinc gluconate 50 MG tablet Take 1 tablet by mouth daily.        Follow-up Information     [facility]. Schedule an appointment as soon as possible for a visit in 1 month(s).   Why: Hospital Follow Up Contact information: [address] [place] [phone]        [facility], [place]. Schedule an  appointment as soon as possible for a visit in 2 day(s).   Specialty: General Surgery Why: For Hiatal Hernia Repair discussion Contact information: [address] [place] [address] [phone]                Allergies  Allergen Reactions   Penicillins Anaphylaxis and Other (See Comments)    Childhood allergy  Has patient had a PCN reaction causing immediate rash, facial/tongue/throat swelling, SOB or lightheadedness with hypotension: Yes Has patient had a PCN reaction causing severe rash involving mucus membranes or skin necrosis: No Has patient had a PCN reaction that required hospitalization Yes Has patient had a PCN reaction occurring within the last 10 years: No If all of the above answers are "NO", then may proceed with Cephalosporin use.    Clarithromycin Nausea And Vomiting   Moxifloxacin Nausea And Vomiting   Quinolones Nausea And Vomiting   Salicylates Nausea And Vomiting   Lactose Intolerance (Gi) Diarrhea   Lactulose Diarrhea   Aspirin Other (See Comments)    Burning of stomach. REACTION: GI upset   Buprenorphine Hcl Itching and Other (See Comments)    Suboxone   Ibuprofen Nausea Only and Other (See Comments)    Severe heartburn, Upset stomach due to acid reflux   Morphine And Related Itching   Sulfa Antibiotics Itching   Allergies as of 06/12/2021       Reactions   Penicillins Anaphylaxis, Other (See Comments)   Childhood allergy  Has patient had a PCN reaction causing immediate rash, facial/tongue/throat swelling, SOB or lightheadedness with hypotension: Yes Has patient had a PCN reaction causing severe rash involving mucus membranes or skin necrosis: No Has patient had a PCN reaction that required hospitalization Yes Has patient had a PCN reaction occurring within the last 10 years: No If all of the above answers are "NO", then may proceed with Cephalosporin use.   Clarithromycin Nausea And Vomiting   Moxifloxacin Nausea And Vomiting    Quinolones Nausea And Vomiting   Salicylates Nausea And Vomiting   Lactose Intolerance (gi) Diarrhea   Lactulose Diarrhea   Aspirin Other (See Comments)   Burning of stomach. REACTION: GI upset   Buprenorphine Hcl Itching, Other (See Comments)   Suboxone   Ibuprofen Nausea Only, Other (See Comments)   Severe heartburn, Upset stomach due to  acid reflux   Morphine And Related Itching   Sulfa Antibiotics Itching        Medication List     STOP taking these medications    esomeprazole 40 MG capsule Commonly known as: NEXIUM   famotidine 20 MG tablet Commonly known as: PEPCID       TAKE these medications    albuterol (2.5 MG/3ML) 0.083% nebulizer solution Commonly known as: PROVENTIL Take 2.5 mg by nebulization every 6 (six) hours as needed for wheezing or shortness of breath.   albuterol 90 MCG/ACT inhaler Commonly known as: PROVENTIL,VENTOLIN Inhale 2 puffs into the lungs every 6 (six) hours as needed for wheezing or shortness of breath. For shortness of breath   ALPRAZolam 1 MG tablet Commonly known as: XANAX Take 0.5 mg by mouth See admin instructions. Take 1/2 tablet 3 times daily as needed for anxiety   ascorbic acid 500 MG tablet Commonly known as: VITAMIN C Take 1 tablet by mouth daily.   DULoxetine 60 MG capsule Commonly known as: CYMBALTA Take 60 mg by mouth daily. Take with 30 mg   DULoxetine 30 MG capsule Commonly known as: CYMBALTA Take 30 mg by mouth daily. Take with 60 mg   EXCEDRIN MIGRAINE PO Take 2 tablets by mouth daily as needed (migraine).   gabapentin 400 MG capsule Commonly known as: NEURONTIN Take 400 mg by mouth 3 (three) times daily.   HYDROcodone-acetaminophen 10-325 MG tablet Commonly known as: NORCO Take 1 tablet by mouth 4 (four) times daily as needed for moderate pain.   lidocaine 5 % Commonly known as: [place] onto the skin.   mirtazapine 15 MG tablet Commonly known as: REMERON Take 15 mg by mouth at bedtime as  needed (for rest).   nicotine polacrilex 4 MG gum Commonly known as: NICORETTE Take 4 mg by mouth as needed for smoking cessation.   OLANZapine 15 MG tablet Commonly known as: ZYPREXA Take 15 mg by mouth at bedtime.   ondansetron 4 MG tablet Commonly known as: ZOFRAN Take 1 tablet (4 mg total) by mouth every 6 (six) hours as needed for nausea.   pantoprazole 40 MG tablet Commonly known as: PROTONIX Take 1 tablet (40 mg total) by mouth 2 (two) times daily. What changed: additional instructions   polyethylene glycol 17 g packet Commonly known as: MIRALAX / GLYCOLAX Take 17 g by mouth 3 (three) times daily.   promethazine 25 MG tablet Commonly known as: PHENERGAN Take 12.5 mg by mouth every 6 (six) hours as needed for nausea or vomiting.   propranolol 10 MG tablet Commonly known as: INDERAL Take 0.5 tablets (5 mg total) by mouth 2 (two) times daily. What changed: how much to take   tiZANidine 2 MG tablet Commonly known as: ZANAFLEX Take 2 mg by mouth 2 (two) times daily as needed for muscle spasms.   traZODone 50 MG tablet Commonly known as: DESYREL Take 50 mg by mouth at bedtime as needed for sleep.   zinc gluconate 50 MG tablet Take 1 tablet by mouth daily.        Procedures/Studies: CT ABDOMEN PELVIS W CONTRAST  Result Date: 06/10/2021 CLINICAL DATA:  Abdominal pain.  Recent small bowel obstruction EXAM: CT ABDOMEN AND PELVIS WITH CONTRAST TECHNIQUE: Multidetector CT imaging of the abdomen and pelvis was performed using the standard protocol following bolus administration of intravenous contrast. CONTRAST:  61m OMNIPAQUE IOHEXOL 350 MG/ML SOLN COMPARISON:  06/09/2021 CTA abdomen/pelvis from [facility] FINDINGS: Lower chest: Large hiatal hernia. Heart  is normal size. No acute abnormality. Hepatobiliary: No focal hepatic abnormality. Gallbladder unremarkable. Pancreas: No focal abnormality or ductal dilatation. Spleen: No focal abnormality.  Normal size.  Adrenals/Urinary Tract: No adrenal abnormality. No focal renal abnormality. No stones or hydronephrosis. Urinary bladder is unremarkable. Stomach/Bowel: Postoperative changes in the sigmoid colon. Postoperative changes in small bowel within the left upper quadrant. No evidence of bowel obstruction. Vascular/Lymphatic: No evidence of aneurysm or adenopathy. Reproductive: Prior hysterectomy.  No adnexal masses. Other: No free fluid or free air. Musculoskeletal: No acute bony abnormality. Postoperative changes in the lumbar spine from posterior fusion. IMPRESSION: Large hiatal hernia. No evidence of bowel obstruction. No change from recent CTA abdomen/pelvis. Electronically Signed   By: Rolm Baptise M.D.   On: 06/10/2021 03:58   DG CHEST PORT 1 VIEW  Result Date: 06/10/2021 CLINICAL DATA:  Shortness of breath EXAM: PORTABLE CHEST 1 VIEW COMPARISON:  06/01/2021 FINDINGS: Right Port-A-Cath remains in place, unchanged. Heart is normal size. Areas of scarring throughout the lungs. No acute confluent opacities or effusions. No acute bony abnormality. IMPRESSION: Chronic changes.  No active disease. Electronically Signed   By: Rolm Baptise M.D.   On: 06/10/2021 19:46     Subjective: Pt says she feels well to go home today, tolerating diet, abdominal pain is better.   Still waiting on BM.   Discharge Exam: Vitals:   06/12/21 0729 06/12/21 0800  BP:  107/67  Pulse:  73  Resp: 12   Temp:    SpO2:  98%   Vitals:   06/12/21 0724 06/12/21 0728 06/12/21 0729 06/12/21 0800  BP:  100/76  107/67  Pulse: 68 71  73  Resp: 14  12   Temp: 98.2 F (36.8 C)     TempSrc: Oral     SpO2:  97%  98%  Weight:      Height:       General: Pt is alert, awake, not in acute distress Cardiovascular: normal S1/S2 +, no rubs, no gallops Respiratory: CTA bilaterally, no wheezing, no rhonchi Abdominal: Soft, NT, ND, bowel sounds + Extremities: no edema, no cyanosis   The results of significant diagnostics from this  hospitalization (including imaging, microbiology, ancillary and laboratory) are listed below for reference.     Microbiology: Recent Results (from the past 240 hour(s))  Resp Panel by RT-PCR (Flu A&B, Covid) Nasopharyngeal Swab     Status: None   Collection Time: 06/10/21  3:23 AM   Specimen: Nasopharyngeal Swab; Nasopharyngeal(NP) swabs in vial transport medium  Result Value Ref Range Status   SARS Coronavirus 2 by RT PCR NEGATIVE NEGATIVE Final    Comment: (NOTE) SARS-CoV-2 target nucleic acids are NOT DETECTED.  The SARS-CoV-2 RNA is generally detectable in upper respiratory specimens during the acute phase of infection. The lowest concentration of SARS-CoV-2 viral copies this assay can detect is 138 copies/mL. A negative result does not preclude SARS-Cov-2 infection and should not be used as the sole basis for treatment or other patient management decisions. A negative result may occur with  improper specimen collection/handling, submission of specimen other than nasopharyngeal swab, presence of viral mutation(s) within the areas targeted by this assay, and inadequate number of viral copies(<138 copies/mL). A negative result must be combined with clinical observations, patient history, and epidemiological information. The expected result is Negative.  Fact Sheet for Patients:  EntrepreneurPulse.com.au  Fact Sheet for Healthcare Providers:  IncredibleEmployment.be  This test is no t yet approved or cleared by the [place] FDA  and  has been authorized for detection and/or diagnosis of SARS-CoV-2 by FDA under an Emergency Use Authorization (EUA). This EUA will remain  in effect (meaning this test can be used) for the duration of the COVID-19 declaration under Section 564(b)(1) of the Act, 21 U.S.C.section 360bbb-3(b)(1), unless the authorization is terminated  or revoked sooner.       Influenza A by PCR NEGATIVE NEGATIVE Final    Influenza B by PCR NEGATIVE NEGATIVE Final    Comment: (NOTE) The Xpert Xpress SARS-CoV-2/FLU/RSV plus assay is intended as an aid in the diagnosis of influenza from Nasopharyngeal swab specimens and should not be used as a sole basis for treatment. Nasal washings and aspirates are unacceptable for Xpert Xpress SARS-CoV-2/FLU/RSV testing.  Fact Sheet for Patients: EntrepreneurPulse.com.au  Fact Sheet for Healthcare Providers: IncredibleEmployment.be  This test is not yet approved or cleared by the [place] FDA and has been authorized for detection and/or diagnosis of SARS-CoV-2 by FDA under an Emergency Use Authorization (EUA). This EUA will remain in effect (meaning this test can be used) for the duration of the COVID-19 declaration under Section 564(b)(1) of the Act, 21 U.S.C. section 360bbb-3(b)(1), unless the authorization is terminated or revoked.  Performed at [facility], [address], [place], [place] [address]   MRSA Next Gen by PCR, Nasal     Status: None   Collection Time: 06/10/21 12:00 PM   Specimen: Nasal Mucosa; Nasal Swab  Result Value Ref Range Status   MRSA by PCR Next Gen NOT DETECTED NOT DETECTED Final    Comment: (NOTE) The GeneXpert MRSA Assay (FDA approved for NASAL specimens only), is one component of a comprehensive MRSA colonization surveillance program. It is not intended to diagnose MRSA infection nor to guide or monitor treatment for MRSA infections. Test performance is not FDA approved in patients less than 93 years old. Performed at [facility], [address], [place], [place] [address]      Labs: BNP (last 3 results) No results for input(s): BNP in the last 8760 hours. Basic Metabolic Panel: Recent Labs  Lab 06/10/21 0232 06/11/21 0456  NA 139 140  K 3.6 4.1  CL 109 104  CO2 27 31  GLUCOSE 100* 107*  BUN 13 6*  CREATININE 0.44 0.39*  CALCIUM 8.0* 9.1  MG  --  2.4   Liver Function  Tests: Recent Labs  Lab 06/10/21 0232 06/11/21 0456  AST 16 20  ALT 21 21  ALKPHOS 63 70  BILITOT 0.1* 0.3  PROT 5.2* 6.3*  ALBUMIN 2.9* 3.4*   No results for input(s): LIPASE, AMYLASE in the last 168 hours. No results for input(s): AMMONIA in the last 168 hours. CBC: Recent Labs  Lab 06/10/21 0232 06/10/21 0945 06/11/21 0456 06/12/21 0335  WBC 12.0*  --  7.1 7.0  NEUTROABS 8.7*  --   --   --   HGB 7.8* 9.8* 11.8* 11.9*  HCT 26.4* 32.7* 39.4 40.7  MCV 81.7  --  82.9 84.1  PLT 374  --  425* 438*   Cardiac Enzymes: No results for input(s): CKTOTAL, CKMB, CKMBINDEX, TROPONINI in the last 168 hours. BNP: Invalid input(s): POCBNP CBG: No results for input(s): GLUCAP in the last 168 hours. D-Dimer No results for input(s): DDIMER in the last 72 hours. Hgb A1c No results for input(s): HGBA1C in the last 72 hours. Lipid Profile No results for input(s): CHOL, HDL, LDLCALC, TRIG, CHOLHDL, LDLDIRECT in the last 72 hours. Thyroid function studies No results  for input(s): TSH, T4TOTAL, T3FREE, THYROIDAB in the last 72 hours.  Invalid input(s): FREET3 Anemia work up No results for input(s): VITAMINB12, FOLATE, FERRITIN, TIBC, IRON, RETICCTPCT in the last 72 hours. Urinalysis    Component Value Date/Time   COLORURINE STRAW (A) 02/01/2020 1337   APPEARANCEUR CLEAR 02/01/2020 1337   LABSPEC 1.025 02/01/2020 1337   PHURINE 5.0 02/01/2020 1337   GLUCOSEU NEGATIVE 02/01/2020 1337   GLUCOSEU NEG mg/dL 10/21/2009 1550   HGBUR NEGATIVE 02/01/2020 1337   BILIRUBINUR NEGATIVE 02/01/2020 1337   KETONESUR NEGATIVE 02/01/2020 1337   PROTEINUR NEGATIVE 02/01/2020 1337   UROBILINOGEN 0.2 12/07/2014 1811   NITRITE NEGATIVE 02/01/2020 1337   LEUKOCYTESUR NEGATIVE 02/01/2020 1337   Sepsis Labs Invalid input(s): PROCALCITONIN,  WBC,  LACTICIDVEN Microbiology Recent Results (from the past 240 hour(s))  Resp Panel by RT-PCR (Flu A&B, Covid) Nasopharyngeal Swab     Status: None    Collection Time: 06/10/21  3:23 AM   Specimen: Nasopharyngeal Swab; Nasopharyngeal(NP) swabs in vial transport medium  Result Value Ref Range Status   SARS Coronavirus 2 by RT PCR NEGATIVE NEGATIVE Final    Comment: (NOTE) SARS-CoV-2 target nucleic acids are NOT DETECTED.  The SARS-CoV-2 RNA is generally detectable in upper respiratory specimens during the acute phase of infection. The lowest concentration of SARS-CoV-2 viral copies this assay can detect is 138 copies/mL. A negative result does not preclude SARS-Cov-2 infection and should not be used as the sole basis for treatment or other patient management decisions. A negative result may occur with  improper specimen collection/handling, submission of specimen other than nasopharyngeal swab, presence of viral mutation(s) within the areas targeted by this assay, and inadequate number of viral copies(<138 copies/mL). A negative result must be combined with clinical observations, patient history, and epidemiological information. The expected result is Negative.  Fact Sheet for Patients:  EntrepreneurPulse.com.au  Fact Sheet for Healthcare Providers:  IncredibleEmployment.be  This test is no t yet approved or cleared by the [place] FDA and  has been authorized for detection and/or diagnosis of SARS-CoV-2 by FDA under an Emergency Use Authorization (EUA). This EUA will remain  in effect (meaning this test can be used) for the duration of the COVID-19 declaration under Section 564(b)(1) of the Act, 21 U.S.C.section 360bbb-3(b)(1), unless the authorization is terminated  or revoked sooner.       Influenza A by PCR NEGATIVE NEGATIVE Final   Influenza B by PCR NEGATIVE NEGATIVE Final    Comment: (NOTE) The Xpert Xpress SARS-CoV-2/FLU/RSV plus assay is intended as an aid in the diagnosis of influenza from Nasopharyngeal swab specimens and should not be used as a sole basis for treatment.  Nasal washings and aspirates are unacceptable for Xpert Xpress SARS-CoV-2/FLU/RSV testing.  Fact Sheet for Patients: EntrepreneurPulse.com.au  Fact Sheet for Healthcare Providers: IncredibleEmployment.be  This test is not yet approved or cleared by the [place] FDA and has been authorized for detection and/or diagnosis of SARS-CoV-2 by FDA under an Emergency Use Authorization (EUA). This EUA will remain in effect (meaning this test can be used) for the duration of the COVID-19 declaration under Section 564(b)(1) of the Act, 21 U.S.C. section 360bbb-3(b)(1), unless the authorization is terminated or revoked.  Performed at [facility], [address] [place], [place] [address]   MRSA Next Gen by PCR, Nasal     Status: None   Collection Time: 06/10/21 12:00 PM   Specimen: Nasal Mucosa; Nasal Swab  Result Value Ref Range Status   MRSA  by PCR Next Gen NOT DETECTED NOT DETECTED Final    Comment: (NOTE) The GeneXpert MRSA Assay (FDA approved for NASAL specimens only), is one component of a comprehensive MRSA colonization surveillance program. It is not intended to diagnose MRSA infection nor to guide or monitor treatment for MRSA infections. Test performance is not FDA approved in patients less than 85 years old. Performed at [facility] [facility], [address], [place], [place] [id number]     Time coordinating discharge:  38 minutes   SIGNED:  Irwin Brakeman, MD  [facility] 06/12/2021, 10:15 AM How to contact the [facility] Attending or Consulting provider [place] or covering provider during after hours [place], for this patient?  Check the care team in [facility] and look for a) attending/consulting [facility] provider listed and b) the [facility] team listed Log into [email or web address] and use 's universal password to access. If you do not have the password, please contact the hospital operator. Locate the [facility] provider you are looking for under [facility] and page to a number that you can be directly reached. If you still have difficulty reaching the provider, please page the [facility] (Director on Call) for the Hospitalists listed on amion for assistance.

## 2021-06-12 NOTE — Discharge Instructions (Signed)
IMPORTANT INFORMATION: PAY CLOSE ATTENTION  ? ?PHYSICIAN DISCHARGE INSTRUCTIONS ? ?Follow with Primary care provider  Hasanaj, Xaje A, MD  and other consultants as instructed by your Hospitalist Physician ? ?SEEK MEDICAL CARE OR RETURN TO EMERGENCY ROOM IF SYMPTOMS COME BACK, WORSEN OR NEW PROBLEM DEVELOPS  ? ?Please note: ?You were cared for by a hospitalist during your hospital stay. Every effort will be made to forward records to your primary care provider.  You can request that your primary care provider send for your hospital records if they have not received them.  Once you are discharged, your primary care physician will handle any further medical issues. Please note that NO REFILLS for any discharge medications will be authorized once you are discharged, as it is imperative that you return to your primary care physician (or establish a relationship with a primary care physician if you do not have one) for your post hospital discharge needs so that they can reassess your need for medications and monitor your lab values. ? ?Please get a complete blood count and chemistry panel checked by your Primary MD at your next visit, and again as instructed by your Primary MD. ? ?Get Medicines reviewed and adjusted: ?Please take all your medications with you for your next visit with your Primary MD ? ?Laboratory/radiological data: ?Please request your Primary MD to go over all hospital tests and procedure/radiological results at the follow up, please ask your primary care provider to get all Hospital records sent to his/her office. ? ?In some cases, they will be blood work, cultures and biopsy results pending at the time of your discharge. Please request that your primary care provider follow up on these results. ? ?If you are diabetic, please bring your blood sugar readings with you to your follow up appointment with primary care.   ? ?Please call and make your follow up appointments as soon as possible.   ? ?Also Note  the following: ?If you experience worsening of your admission symptoms, develop shortness of breath, life threatening emergency, suicidal or homicidal thoughts you must seek medical attention immediately by calling 911 or calling your MD immediately  if symptoms less severe. ? ?You must read complete instructions/literature along with all the possible adverse reactions/side effects for all the Medicines you take and that have been prescribed to you. Take any new Medicines after you have completely understood and accpet all the possible adverse reactions/side effects.  ? ?Do not drive when taking Pain medications or sleeping medications (Benzodiazepines) ? ?Do not take more than prescribed Pain, Sleep and Anxiety Medications. It is not advisable to combine anxiety,sleep and pain medications without talking with your primary care practitioner ? ?Special Instructions: If you have smoked or chewed Tobacco  in the last 2 yrs please stop smoking, stop any regular Alcohol  and or any Recreational drug use. ? ?Wear Seat belts while driving.  Do not drive if taking any narcotic, mind altering or controlled substances or recreational drugs or alcohol.  ? ? ? ? ? ?

## 2021-06-12 NOTE — Progress Notes (Signed)
[facility] night shift SDU coverage note.  The patient's blood pressure decreased to 67/36 mmHg while sleeping.  LR 500 mL over 30 minutes ordered.  After bolus, the systolic BP has been ranging from the 90s to the 110s.  Tennis Must, MD.

## 2021-06-13 ENCOUNTER — Encounter (HOSPITAL_COMMUNITY): Payer: Self-pay | Admitting: Gastroenterology

## 2021-06-14 LAB — SURGICAL PATHOLOGY

## 2021-07-25 ENCOUNTER — Ambulatory Visit: Payer: Medicare Other | Admitting: Gastroenterology

## 2021-07-26 ENCOUNTER — Other Ambulatory Visit (HOSPITAL_COMMUNITY): Payer: Self-pay | Admitting: Family Medicine

## 2021-07-26 DIAGNOSIS — Z1231 Encounter for screening mammogram for malignant neoplasm of breast: Secondary | ICD-10-CM

## 2021-08-03 ENCOUNTER — Ambulatory Visit: Payer: Medicare Other | Admitting: Internal Medicine

## 2021-08-04 ENCOUNTER — Ambulatory Visit (HOSPITAL_COMMUNITY): Payer: Medicare Other

## 2022-01-23 ENCOUNTER — Encounter: Payer: Self-pay | Admitting: Internal Medicine

## 2022-02-12 ENCOUNTER — Telehealth: Payer: Self-pay

## 2022-02-12 NOTE — Telephone Encounter (Signed)
Pt LMOVM stating to cancel appt for 02/14/2022 @ 9 with Dr. Abbey Chatters ?

## 2022-02-14 ENCOUNTER — Ambulatory Visit: Payer: Medicare Other | Admitting: Internal Medicine

## 2022-03-01 ENCOUNTER — Other Ambulatory Visit: Payer: Self-pay

## 2022-03-01 ENCOUNTER — Ambulatory Visit (INDEPENDENT_AMBULATORY_CARE_PROVIDER_SITE_OTHER): Payer: Medicare Other | Admitting: Internal Medicine

## 2022-03-01 ENCOUNTER — Encounter: Payer: Self-pay | Admitting: Internal Medicine

## 2022-03-01 ENCOUNTER — Telehealth: Payer: Self-pay

## 2022-03-01 VITALS — BP 102/58 | HR 98 | Temp 97.3°F | Ht 64.0 in | Wt 138.4 lb

## 2022-03-01 DIAGNOSIS — R195 Other fecal abnormalities: Secondary | ICD-10-CM

## 2022-03-01 DIAGNOSIS — K257 Chronic gastric ulcer without hemorrhage or perforation: Secondary | ICD-10-CM

## 2022-03-01 DIAGNOSIS — D500 Iron deficiency anemia secondary to blood loss (chronic): Secondary | ICD-10-CM | POA: Diagnosis not present

## 2022-03-01 DIAGNOSIS — K219 Gastro-esophageal reflux disease without esophagitis: Secondary | ICD-10-CM

## 2022-03-01 MED ORDER — CLENPIQ 10-3.5-12 MG-GM -GM/175ML PO SOLN: 1.0000 | ORAL | 0 refills | Status: DC

## 2022-03-01 MED ORDER — PANTOPRAZOLE SODIUM 40 MG PO TBEC: 40.0000 mg | DELAYED_RELEASE_TABLET | Freq: Two times a day (BID) | ORAL | 11 refills | Status: DC

## 2022-03-01 NOTE — Telephone Encounter (Signed)
PA for TCS/EGD submitted via [facility] website. PA# [id number], valid 03/15/22-06/13/22.

## 2022-03-01 NOTE — H&P (View-Only) (Signed)
  Referring Provider: Hasanaj, Xaje A, MD Primary Care Physician:  Hasanaj, Xaje A, MD Primary GI:  Dr. Daquawn Seelman  Chief Complaint  Patient presents with   Colonoscopy    Pt here for consult for colonoscopy and EGD    HPI:   [name] is a 61 y.o. female who presents to the clinic today for hospital follow-up visit.   Has not been seen in our clinic since 2016.  She has a history of large hiatal hernia, Cameron's lesions, recurrent GI bleeding, iron deficiency anemia.  Last EGD 06/10/2021 inpatient for acute GI bleeding found to have nonbleeding Cameron's lesions.  Recommended twice daily PPI and Carafate.  She was referred to [facility]. Patient has met with Dr. Connor in [place] 07/2021 to discuss hiatal hernia repair.  She is at increased risk for complications from surgery given her numerous abdominal surgeries in the past as well as her chronic underlying lung disease.  Recommended conservative management and reevaluation in 4 months.  Unfortunately patient missed her follow-up visit as she was admitted to the hospital with COPD exacerbation.  Patient continues to have intermittent melena.  States she is required 6-7 transfusions over the last 5 months.  Last hemoglobin 8.3 on 02/09/2022.  She got as low as 5.4 on 12/05/2021.  Recent admission to [facility] for pulmonary arrest.  She was being treated for COPD exacerbation in the ER and given IV ciprofloxacin.  She then went unresponsive requiring emergent intubation.  States she was in the hospital for 4 days.  Not on chronic oxygen.  She has had numerous abdominal surgeries in the past including rectal prolapse repair, laparoscopy for ruptured ovarian cyst, laparoscopic pelvic adhesiolysis, hysterectomy, appendectomy, bowel resection x2, umbilical hernia repaired x5.  Last colonoscopy 11/17/2013 unremarkable, normal anastomosis.  Colonoscopy 03/21/2012 with tubular adenoma removed.  Past Medical History:   Diagnosis Date   ADHD (attention deficit hyperactivity disorder)    Anginal pain (HCC)    Asthma    BMI (body mass index) 20.0-29.9 2009 127 LBS   Chronic abdominal pain 2003   EX LAP APR 2004 RUPTURE L OV CYST, JUL 2004 ADHESIONS   Chronic nausea    Clostridium difficile colitis 04/10/11 &11/2011   tx w/ flagyl   COPD (chronic obstructive pulmonary disease) (HCC)    Degenerative disc disease    Depression    GERD (gastroesophageal reflux disease)    Hiatal hernia 2008   on EGD above   Irritable bowel syndrome 2004 CONSTIPATION   Migraines    MRSA infection    Dr. Wolicki->sinusitis, currently under treatment   PTSD (post-traumatic stress disorder)    PUD (peptic ulcer disease) 01/2007   EGD Dr Rourk, 2 antral ulcers, negative h pylori   Rectal prolapse    S/P colonoscopy 06/02/09   normal (Dr. Anwar)   S/P endoscopy 03/26/11   gastritis-Dr Fields   SBO (small bowel obstruction) (HCC)     Past Surgical History:  Procedure Laterality Date   ABDOMINAL HYSTERECTOMY     APPENDECTOMY     BACK SURGERY     BIOPSY  06/10/2021   Procedure: BIOPSY;  Surgeon: Castaneda Mayorga, Daniel, MD;  Location: [facility];  Service: Gastroenterology;;   BOWEL RESECTION     x2, secondary to adhesions   CERVICAL SPINE SURGERY     COLONOSCOPY  03/21/2012    SLF:SIMPLE ADENOMA(1) POOR PREP   COLONOSCOPY N/A 11/17/2013   SLF:Normal mucosa in the terminal ileum/NORMAL surgical   anastomosis/Small internal hemorrhoids   ESOPHAGOGASTRODUODENOSCOPY  03/23/2011    Normal esophagus without evidence of Barrett's, mass, erosions, or ulcerations./ Patchy erythema with occasional erosion in the antrum.  Biopsies  obtained via cold forceps to evaluate for H. pylori gastritis/ Small hiatal hernia./Normal duodenal bulb and second portion of the duodenum.   ESOPHAGOGASTRODUODENOSCOPY  03/21/2012   SLF:SMALL Hiatal hernia/ABDOMINALPAIN/DIARRHEA MOST LIKELY DUE TO IBS, GASTRITIS DUODENITIS    ESOPHAGOGASTRODUODENOSCOPY N/A 02/22/2021   Procedure: ESOPHAGOGASTRODUODENOSCOPY (EGD);  Surgeon: Schooler, Vincent, MD;  Location: [facility];  Service: Endoscopy;  Laterality: N/A;   ESOPHAGOGASTRODUODENOSCOPY (EGD) WITH PROPOFOL N/A 06/10/2021   Procedure: ESOPHAGOGASTRODUODENOSCOPY (EGD) WITH PROPOFOL;  Surgeon: Castaneda Mayorga, Daniel, MD;  Location: [facility];  Service: Gastroenterology;  Laterality: N/A;   FLEXIBLE SIGMOIDOSCOPY  07/14/2002   Normal limited flexible sigmoidoscopy with stool in the rectum and rectosigmoid precluding a full colonoscopy   JOINT REPLACEMENT     LAPAROSCOPIC LYSIS INTESTINAL ADHESIONS     NECK SURGERY  2009   PARTIAL HYSTERECTOMY     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   Procedure: RIGHT TOTAL KNEE ARTHROPLASTY;  Surgeon: Yates, Mark C, MD;  Location: [facility] OR;  Service: Orthopedics;  Laterality: Right;   TUBAL LIGATION     UMBILICAL HERNIA REPAIR  2008   x5   UPPER GASTROINTESTINAL ENDOSCOPY  APR 2008 [facility] BLEEDING/PAIN   PUD   UPPER GASTROINTESTINAL ENDOSCOPY  JUL 2012 SLF PAIN   MILD GASTRITIS    Current Outpatient Medications  Medication Sig Dispense Refill   albuterol (PROVENTIL) (2.5 MG/3ML) 0.083% nebulizer solution Take 2.5 mg by nebulization every 6 (six) hours as needed for wheezing or shortness of breath.     albuterol (PROVENTIL,VENTOLIN) 90 MCG/ACT inhaler Inhale 2 puffs into the lungs every 6 (six) hours as needed for wheezing or shortness of breath. For shortness of breath     ALPRAZolam (XANAX) 1 MG tablet Take 0.5 mg by mouth See admin instructions. Take 1/2 tablet 3 times daily as needed for anxiety     Aspirin-Acetaminophen-Caffeine (EXCEDRIN MIGRAINE PO) Take 2 tablets by mouth daily as needed (migraine).     DULoxetine (CYMBALTA) 30 MG capsule Take 30 mg by mouth daily. Take with 60 mg     DULoxetine (CYMBALTA) 60 MG capsule Take 60 mg by mouth daily. Take with 30 mg     gabapentin  (NEURONTIN) 400 MG capsule Take 400 mg by mouth 3 (three) times daily.     mirtazapine (REMERON) 15 MG tablet Take 15 mg by mouth at bedtime as needed (for rest).      nicotine polacrilex (NICORETTE) 4 MG gum Take 4 mg by mouth as needed for smoking cessation.     OLANZapine (ZYPREXA) 15 MG tablet Take 15 mg by mouth at bedtime.     ondansetron (ZOFRAN) 4 MG tablet Take 1 tablet (4 mg total) by mouth every 6 (six) hours as needed for nausea. 20 tablet 0   propranolol (INDERAL) 10 MG tablet Take 0.5 tablets (5 mg total) by mouth 2 (two) times daily.     tiZANidine (ZANAFLEX) 2 MG tablet Take 2 mg by mouth 2 (two) times daily as needed for muscle spasms.     traZODone (DESYREL) 50 MG tablet Take 50 mg by mouth at bedtime as needed for sleep.  1   ascorbic acid (VITAMIN C) 500 MG tablet Take 1 tablet by mouth daily. (Patient not taking: Reported on 03/01/2022)       HYDROcodone-acetaminophen (NORCO) 10-325 MG tablet Take 1 tablet by mouth 4 (four) times daily as needed for moderate pain. (Patient not taking: Reported on 03/01/2022)     lidocaine (LIDODERM) 5 % Place onto the skin. (Patient not taking: Reported on 03/01/2022)     pantoprazole (PROTONIX) 40 MG tablet Take 1 tablet (40 mg total) by mouth 2 (two) times daily. 60 tablet 11   promethazine (PHENERGAN) 25 MG tablet Take 12.5 mg by mouth every 6 (six) hours as needed for nausea or vomiting. (Patient not taking: Reported on 03/01/2022)     Sod Picosulfate-Mag Ox-Cit Acd (CLENPIQ) 10-3.5-12 MG-GM -GM/175ML SOLN Take 1 kit by mouth as directed. 350 mL 0   zinc gluconate 50 MG tablet Take 1 tablet by mouth daily. (Patient not taking: Reported on 03/01/2022)     No current facility-administered medications for this visit.    Allergies as of 03/01/2022 - Review Complete 03/01/2022  Allergen Reaction Noted   Penicillins Anaphylaxis and Other (See Comments)    Clarithromycin Nausea And Vomiting 03/14/2016   Moxifloxacin Nausea And Vomiting 03/14/2016    Quinolones Nausea And Vomiting 03/14/2016   Salicylates Nausea And Vomiting 03/14/2016   Cipro [ciprofloxacin hcl] Other (See Comments) 03/01/2022   Lactose intolerance (gi) Diarrhea 04/18/2014   Lactulose Diarrhea 04/18/2014   Aspirin Other (See Comments)    Buprenorphine hcl Itching and Other (See Comments) 01/05/2015   Ibuprofen Nausea Only and Other (See Comments) 10/08/2011   Morphine and related Itching 07/15/2011   Sulfa antibiotics Itching 05/24/2011    Family History  Adopted: Yes    Social History   Socioeconomic History   Marital status: Divorced    Spouse name: Not on file   Number of children: 1   Years of education: Not on file   Highest education level: Not on file  Occupational History   Occupation: disabled  Tobacco Use   Smoking status: Every Day    Packs/day: 0.50    Years: 10.00    Pack years: 5.00    Types: Cigarettes   Smokeless tobacco: Never  Vaping Use   Vaping Use: Former  Substance and Sexual Activity   Alcohol use: Not Currently    Comment: 05/07/2017 "quit when I was 19; drank for 1 year total"   Drug use: No   Sexual activity: Never    Birth control/protection: Surgical  Other Topics Concern   Not on file  Social History Narrative   Not on file   Social Determinants of Health   Financial Resource Strain: Not on file  Food Insecurity: Not on file  Transportation Needs: Not on file  Physical Activity: Not on file  Stress: Not on file  Social Connections: Not on file    Subjective: Review of Systems  Constitutional:  Positive for malaise/fatigue. Negative for chills and fever.  HENT:  Negative for congestion and hearing loss.   Eyes:  Negative for blurred vision and double vision.  Respiratory:  Positive for shortness of breath. Negative for cough.   Cardiovascular:  Negative for chest pain and palpitations.  Gastrointestinal:  Negative for abdominal pain, blood in stool, constipation, diarrhea, heartburn, melena and vomiting.   Genitourinary:  Negative for dysuria and urgency.  Musculoskeletal:  Negative for joint pain and myalgias.  Skin:  Negative for itching and rash.  Neurological:  Negative for dizziness and headaches.  Psychiatric/Behavioral:  Negative for depression. The patient is not nervous/anxious.     Objective: BP (!) 102/58   Pulse 98     Temp (!) 97.3 F (36.3 C)   Ht 5' 4" (1.626 m)   Wt 138 lb 6 oz (62.8 kg)   BMI 23.75 kg/m  Physical Exam Constitutional:      Appearance: Normal appearance.  HENT:     Head: Normocephalic and atraumatic.  Eyes:     Extraocular Movements: Extraocular movements intact.     Conjunctiva/sclera: Conjunctivae normal.  Cardiovascular:     Rate and Rhythm: Normal rate and regular rhythm.  Pulmonary:     Effort: Pulmonary effort is normal.     Breath sounds: Wheezing present.  Abdominal:     General: Bowel sounds are normal.     Palpations: Abdomen is soft.  Musculoskeletal:        General: No swelling. Normal range of motion.     Cervical back: Normal range of motion and neck supple.  Skin:    General: Skin is warm and dry.     Coloration: Skin is not jaundiced.  Neurological:     General: No focal deficit present.     Mental Status: She is alert and oriented to person, place, and time.  Psychiatric:        Mood and Affect: Mood normal.        Behavior: Behavior normal.     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  The risks including infection, bleed, or perforation as well as benefits, limitations, alternatives and imponderables have been reviewed with the patient. Potential for esophageal dilation, biopsy, etc. have also been reviewed.  Questions have been answered. All parties agreeable.  Patient has met with Dr.  Connor [facility] in [place] to discuss hiatal hernia repair.  She is at increased risk for complications from surgery given her numerous abdominal surgeries in the past as well as her chronic underlying lung disease.  If no other identifiable cause of patient's recurrent anemia requiring blood transfusions found besides Cameron lesions, may reconsider surgical fixation despite risks.  Patient has been off her pantoprazole for months now as she "ran out of refills."  I have refilled today.  Year supply sent to pharmacy.  Further recommendations to follow.  03/01/2022 11:01 AM   Disclaimer: This note was dictated with voice recognition software. Similar sounding words can inadvertently be transcribed and may not be corrected upon review.  

## 2022-03-01 NOTE — Progress Notes (Signed)
Referring Provider: Neale Burly, MD Primary Care Physician:  Neale Burly, MD Primary GI:  Dr. Abbey Chatters  Chief Complaint  Patient presents with   Colonoscopy    Pt here for consult for colonoscopy and EGD    HPI:   [name] is a 61 y.o. female who presents to the clinic today for hospital follow-up visit.   Has not been seen in our clinic since 2016.  She has a history of large hiatal hernia, Cameron's lesions, recurrent GI bleeding, iron deficiency anemia.  Last EGD 06/10/2021 inpatient for acute GI bleeding found to have nonbleeding Cameron's lesions.  Recommended twice daily PPI and Carafate.  She was referred to [facility] surgery. Patient has met with Dr. Kae Heller in [facility] 07/2021 to discuss hiatal hernia repair.  She is at increased risk for complications from surgery given her numerous abdominal surgeries in the past as well as her chronic underlying lung disease.  Recommended conservative management and reevaluation in 4 months.  Unfortunately patient missed her follow-up visit as she was admitted to the hospital with COPD exacerbation.  Patient continues to have intermittent melena.  States she is required 6-7 transfusions over the last 5 months.  Last hemoglobin 8.3 on 02/09/2022.  She got as low as 5.4 on 12/05/2021.  Recent admission to [facility] for pulmonary arrest.  She was being treated for COPD exacerbation in the ER and given IV ciprofloxacin.  She then went unresponsive requiring emergent intubation.  States she was in the hospital for 4 days.  Not on chronic oxygen.  She has had numerous abdominal surgeries in the past including rectal prolapse repair, laparoscopy for ruptured ovarian cyst, laparoscopic pelvic adhesiolysis, hysterectomy, appendectomy, bowel resection x2, umbilical hernia repaired x5.  Last colonoscopy 11/17/2013 unremarkable, normal anastomosis.  Colonoscopy 03/21/2012 with tubular adenoma removed.  Past Medical History:   Diagnosis Date   ADHD (attention deficit hyperactivity disorder)    Anginal pain (HCC)    Asthma    BMI (body mass index) 20.0-29.9 2009 127 LBS   Chronic abdominal pain 2003   EX LAP APR 2004 RUPTURE L OV CYST, JUL 2004 ADHESIONS   Chronic nausea    Clostridium difficile colitis 04/10/11 &11/2011   tx w/ flagyl   COPD (chronic obstructive pulmonary disease) ([place])    Degenerative disc disease    Depression    GERD (gastroesophageal reflux disease)    Hiatal hernia 2008   on EGD above   Irritable bowel syndrome 2004 CONSTIPATION   Migraines    MRSA infection    Dr. [name], currently under treatment   PTSD (post-traumatic stress disorder)    PUD (peptic ulcer disease) 01/2007   EGD Dr [name], 2 antral ulcers, negative h pylori   Rectal prolapse    S/P colonoscopy 06/02/09   normal ([name])   S/P endoscopy 03/26/11   gastritis-Dr [name]   SBO (small bowel obstruction) [facility])     Past Surgical History:  Procedure Laterality Date   ABDOMINAL HYSTERECTOMY     APPENDECTOMY     BACK SURGERY     BIOPSY  06/10/2021   Procedure: BIOPSY;  Surgeon: Harvel Quale, MD;  [facility];  Service: Gastroenterology;;   BOWEL RESECTION     x2, secondary to adhesions   CERVICAL SPINE SURGERY     COLONOSCOPY  03/21/2012    NID:[id number] ADENOMA(1) POOR PREP   COLONOSCOPY N/A 11/17/2013   NTI:RWERXV mucosa in the terminal ileum/NORMAL surgical  anastomosis/Small internal hemorrhoids   ESOPHAGOGASTRODUODENOSCOPY  03/23/2011    Normal esophagus without evidence of Barrett's, mass, erosions, or ulcerations./ Patchy erythema with occasional erosion in the antrum.  Biopsies  obtained via cold forceps to evaluate for H. pylori gastritis/ Small hiatal hernia./Normal duodenal bulb and second portion of the duodenum.   ESOPHAGOGASTRODUODENOSCOPY  03/21/2012   QQP:[id number] Hiatal hernia/ABDOMINALPAIN/DIARRHEA MOST LIKELY DUE TO IBS, GASTRITIS DUODENITIS    ESOPHAGOGASTRODUODENOSCOPY N/A 02/22/2021   Procedure: ESOPHAGOGASTRODUODENOSCOPY (EGD);  Surgeon: Wilford Corner, MD;  Location: [facility];  Service: Endoscopy;  Laterality: N/A;   ESOPHAGOGASTRODUODENOSCOPY (EGD) WITH PROPOFOL N/A 06/10/2021   Procedure: ESOPHAGOGASTRODUODENOSCOPY (EGD) WITH PROPOFOL;  Surgeon: Harvel Quale, MD;  [facility];  Service: Gastroenterology;  Laterality: N/A;   FLEXIBLE SIGMOIDOSCOPY  07/14/2002   Normal limited flexible sigmoidoscopy with stool in the rectum and rectosigmoid precluding a full colonoscopy   JOINT REPLACEMENT     LAPAROSCOPIC LYSIS INTESTINAL ADHESIONS     NECK SURGERY  2009   PARTIAL HYSTERECTOMY     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   Procedure: RIGHT TOTAL KNEE ARTHROPLASTY;  Surgeon: Marybelle Killings, MD;  Location: [place];  Service: Orthopedics;  Laterality: Right;   TUBAL LIGATION     UMBILICAL HERNIA REPAIR  2008   x5   UPPER GASTROINTESTINAL ENDOSCOPY  APR 2008 [facility] BLEEDING/PAIN   PUD   UPPER GASTROINTESTINAL ENDOSCOPY  JUL 2012 [facility] PAIN   MILD GASTRITIS    Current Outpatient Medications  Medication Sig Dispense Refill   albuterol (PROVENTIL) (2.5 MG/3ML) 0.083% nebulizer solution Take 2.5 mg by nebulization every 6 (six) hours as needed for wheezing or shortness of breath.     albuterol (PROVENTIL,VENTOLIN) 90 MCG/ACT inhaler Inhale 2 puffs into the lungs every 6 (six) hours as needed for wheezing or shortness of breath. For shortness of breath     ALPRAZolam (XANAX) 1 MG tablet Take 0.5 mg by mouth See admin instructions. Take 1/2 tablet 3 times daily as needed for anxiety     Aspirin-Acetaminophen-Caffeine (EXCEDRIN MIGRAINE PO) Take 2 tablets by mouth daily as needed (migraine).     DULoxetine (CYMBALTA) 30 MG capsule Take 30 mg by mouth daily. Take with 60 mg     DULoxetine (CYMBALTA) 60 MG capsule Take 60 mg by mouth daily. Take with 30 mg     gabapentin  (NEURONTIN) 400 MG capsule Take 400 mg by mouth 3 (three) times daily.     mirtazapine (REMERON) 15 MG tablet Take 15 mg by mouth at bedtime as needed (for rest).      nicotine polacrilex (NICORETTE) 4 MG gum Take 4 mg by mouth as needed for smoking cessation.     OLANZapine (ZYPREXA) 15 MG tablet Take 15 mg by mouth at bedtime.     ondansetron (ZOFRAN) 4 MG tablet Take 1 tablet (4 mg total) by mouth every 6 (six) hours as needed for nausea. 20 tablet 0   propranolol (INDERAL) 10 MG tablet Take 0.5 tablets (5 mg total) by mouth 2 (two) times daily.     tiZANidine (ZANAFLEX) 2 MG tablet Take 2 mg by mouth 2 (two) times daily as needed for muscle spasms.     traZODone (DESYREL) 50 MG tablet Take 50 mg by mouth at bedtime as needed for sleep.  1   ascorbic acid (VITAMIN C) 500 MG tablet Take 1 tablet by mouth daily. (Patient not taking: Reported on 03/01/2022)  HYDROcodone-acetaminophen (NORCO) 10-325 MG tablet Take 1 tablet by mouth 4 (four) times daily as needed for moderate pain. (Patient not taking: Reported on 03/01/2022)     lidocaine (LIDODERM) 5 % Place onto the skin. (Patient not taking: Reported on 03/01/2022)     pantoprazole (PROTONIX) 40 MG tablet Take 1 tablet (40 mg total) by mouth 2 (two) times daily. 60 tablet 11   promethazine (PHENERGAN) 25 MG tablet Take 12.5 mg by mouth every 6 (six) hours as needed for nausea or vomiting. (Patient not taking: Reported on 03/01/2022)     Sod Picosulfate-Mag Ox-Cit Acd (CLENPIQ) 10-3.5-12 MG-GM -GM/175ML SOLN Take 1 kit by mouth as directed. 350 mL 0   zinc gluconate 50 MG tablet Take 1 tablet by mouth daily. (Patient not taking: Reported on 03/01/2022)     No current facility-administered medications for this visit.    Allergies as of 03/01/2022 - Review Complete 03/01/2022  Allergen Reaction Noted   Penicillins Anaphylaxis and Other (See Comments)    Clarithromycin Nausea And Vomiting 03/14/2016   Moxifloxacin Nausea And Vomiting 03/14/2016    Quinolones Nausea And Vomiting 21/30/8657   Salicylates Nausea And Vomiting 03/14/2016   Cipro [ciprofloxacin hcl] Other (See Comments) 03/01/2022   Lactose intolerance (gi) Diarrhea 04/18/2014   Lactulose Diarrhea 04/18/2014   Aspirin Other (See Comments)    Buprenorphine hcl Itching and Other (See Comments) 01/05/2015   Ibuprofen Nausea Only and Other (See Comments) 10/08/2011   Morphine and related Itching 07/15/2011   Sulfa antibiotics Itching 05/24/2011    Family History  Adopted: Yes    Social History   Socioeconomic History   Marital status: Divorced    Spouse name: Not on file   Number of children: 1   Years of education: Not on file   Highest education level: Not on file  Occupational History   Occupation: disabled  Tobacco Use   Smoking status: Every Day    Packs/day: 0.50    Years: 10.00    Pack years: 5.00    Types: Cigarettes   Smokeless tobacco: Never  Vaping Use   Vaping Use: Former  Substance and Sexual Activity   Alcohol use: Not Currently    Comment: 05/07/2017 "quit when I was 19; drank for 1 year total"   Drug use: No   Sexual activity: Never    Birth control/protection: Surgical  Other Topics Concern   Not on file  Social History Narrative   Not on file   Social Determinants of Health   Financial Resource Strain: Not on file  Food Insecurity: Not on file  Transportation Needs: Not on file  Physical Activity: Not on file  Stress: Not on file  Social Connections: Not on file    Subjective: Review of Systems  Constitutional:  Positive for malaise/fatigue. Negative for chills and fever.  HENT:  Negative for congestion and hearing loss.   Eyes:  Negative for blurred vision and double vision.  Respiratory:  Positive for shortness of breath. Negative for cough.   Cardiovascular:  Negative for chest pain and palpitations.  Gastrointestinal:  Negative for abdominal pain, blood in stool, constipation, diarrhea, heartburn, melena and vomiting.   Genitourinary:  Negative for dysuria and urgency.  Musculoskeletal:  Negative for joint pain and myalgias.  Skin:  Negative for itching and rash.  Neurological:  Negative for dizziness and headaches.  Psychiatric/Behavioral:  Negative for depression. The patient is not nervous/anxious.     Objective: BP (!) 102/58   Pulse 98  Temp (!) 97.3 F (36.3 C)   Ht _0  (1.626 m)   Wt 138 lb 6 oz (62.8 kg)   BMI 23.75 kg/m  Physical Exam Constitutional:      Appearance: Normal appearance.  HENT:     Head: Normocephalic and atraumatic.  Eyes:     Extraocular Movements: Extraocular movements intact.     Conjunctiva/sclera: Conjunctivae normal.  Cardiovascular:     Rate and Rhythm: Normal rate and regular rhythm.  Pulmonary:     Effort: Pulmonary effort is normal.     Breath sounds: Wheezing present.  Abdominal:     General: Bowel sounds are normal.     Palpations: Abdomen is soft.  Musculoskeletal:        General: No swelling. Normal range of motion.     Cervical back: Normal range of motion and neck supple.  Skin:    General: Skin is warm and dry.     Coloration: Skin is not jaundiced.  Neurological:     General: No focal deficit present.     Mental Status: She is alert and oriented to person, place, and time.  Psychiatric:        Mood and Affect: Mood normal.        Behavior: Behavior normal.     Assessment: *Iron deficiency anemia-due to chronic blood loss *Hiatal hernia complicated by Lysbeth Galas lesions *Heme positive stool  Plan: Given patient's worsening anemia and heme positive stool, we will schedule for upper endoscopy and colonoscopy to further evaluate.  The risks including infection, bleed, or perforation as well as benefits, limitations, alternatives and imponderables have been reviewed with the patient. Potential for esophageal dilation, biopsy, etc. have also been reviewed.  Questions have been answered. All parties agreeable.  Patient has met with Dr.  Laretta Bolster [facility] surgery in [place] to discuss hiatal hernia repair.  She is at increased risk for complications from surgery given her numerous abdominal surgeries in the past as well as her chronic underlying lung disease.  If no other identifiable cause of patient's recurrent anemia requiring blood transfusions found besides Cameron lesions, may reconsider surgical fixation despite risks.  Patient has been off her pantoprazole for months now as she "ran out of refills."  I have refilled today.  Year supply sent to pharmacy.  Further recommendations to follow.  03/01/2022 11:01 AM   Disclaimer: This note was dictated with voice recognition software. Similar sounding words can inadvertently be transcribed and may not be corrected upon review.

## 2022-03-01 NOTE — Patient Instructions (Signed)
We will schedule you for upper endoscopy and colonoscopy to further evaluate your chronic anemia, history of Cameron's ulcers, heme positive stool.  I have refilled your pantoprazole and sent a year supply to [facility].  I want you to take this instead of your Pepcid.  Further recommendations to follow.  It was nice meeting you today.  Dr. Abbey Chatters  At [facility] we value your feedback. You may receive a survey about your visit today. Please share your experience as we strive to create trusting relationships with our patients to provide genuine, compassionate, quality care.  We appreciate your understanding and patience as we review any laboratory studies, imaging, and other diagnostic tests that are ordered as we care for you. Our office policy is 5 business days for review of these results, and any emergent or urgent results are addressed in a timely manner for your best interest. If you do not hear from our office in 1 week, please contact us.   We also encourage the use of MyChart, which contains your medical information for your review as well. If you are not enrolled in this feature, an access code is on this after visit summary for your convenience. Thank you for allowing [place] to be involved in your care.  It was great to see you today!  I hope you have a great rest of your Spring!    Elon Alas. Abbey Chatters, D.O. Gastroenterology and Hepatology [facility]

## 2022-03-12 NOTE — Patient Instructions (Signed)
[facility]IT[facility]  03/12/2022     '@PREFPERIOPPHARMACY'$ @   Your procedure is scheduled on  03/15/2022.   Report to [facility] at  [place]  A.[facility].   Call this number if you have problems the morning of surgery:  [phone]   Remember:  Follow the diet and prep instructions given to you by the office.    Use your nebulizer and your inhaler before you come and bring yor rescue inhaler with you.     Take these medicines the morning of surgery with A SIP OF WATER       cymbalta, nexium, gabapentin, hydrocodone(if needed), protonix, inderal, zanaflex (if needed).     Do not wear jewelry, make-up or nail polish.  Do not wear lotions, powders, or perfumes, or deodorant.  Do not shave 48 hours prior to surgery.  Men may shave face and neck.  Do not bring valuables to the hospital.  [facility] is not responsible for any belongings or valuables.  Contacts, dentures or bridgework may not be worn into surgery.  Leave your suitcase in the car.  After surgery it may be brought to your room.  For patients admitted to the hospital, discharge time will be determined by your treatment team.  Patients discharged the day of surgery will not be allowed to drive home and must have someone with them for 24 hours.    Special instructions:   DO NOT smoke tobacco or vape for 24 hours before your procedure.  Please read over the following fact sheets that you were given. Anesthesia Post-op Instructions and Care and Recovery After Surgery      Upper Endoscopy, Adult, Care After This sheet gives you information about how to care for yourself after your procedure. Your health care provider may also give you more specific instructions. If you have problems or questions, contact your health care provider. What can I expect after the procedure? After the procedure, it is common to have: A sore throat. Mild stomach pain or discomfort. Bloating. Nausea. Follow these instructions at  home:  Follow instructions from your health care provider about what to eat or drink after your procedure. Return to your normal activities as told by your health care provider. Ask your health care provider what activities are safe for you. Take over-the-counter and prescription medicines only as told by your health care provider. If you were given a sedative during the procedure, it can affect you for several hours. Do not drive or operate machinery until your health care provider says that it is safe. Keep all follow-up visits as told by your health care provider. This is important. Contact a health care provider if you have: A sore throat that lasts longer than one day. Trouble swallowing. Get help right away if: You vomit blood or your vomit looks like coffee grounds. You have: A fever. Bloody, black, or tarry stools. A severe sore throat or you cannot swallow. Difficulty breathing. Severe pain in your chest or abdomen. Summary After the procedure, it is common to have a sore throat, mild stomach discomfort, bloating, and nausea. If you were given a sedative during the procedure, it can affect you for several hours. Do not drive or operate machinery until your health care provider says that it is safe. Follow instructions from your health care provider about what to eat or drink after your procedure. Return to your normal activities as told by your health care provider. This information is  not intended to replace advice given to you by your health care provider. Make sure you discuss any questions you have with your health care provider. Document Revised: 07/31/2019 Document Reviewed: 02/24/2018 Elsevier Patient Education  [place]. Colonoscopy, Adult, Care After The following information offers guidance on how to care for yourself after your procedure. Your health care provider may also give you more specific instructions. If you have problems or questions, contact your  health care provider. What can I expect after the procedure? After the procedure, it is common to have: A small amount of blood in your stool for 24 hours after the procedure. Some gas. Mild cramping or bloating of your abdomen. Follow these instructions at home: Eating and drinking  Drink enough fluid to keep your urine pale yellow. Follow instructions from your health care provider about eating or drinking restrictions. Resume your normal diet as told by your health care provider. Avoid heavy or fried foods that are hard to digest. Activity Rest as told by your health care provider. Avoid sitting for a long time without moving. Get up to take short walks every 1-2 hours. This is important to improve blood flow and breathing. Ask for help if you feel weak or unsteady. Return to your normal activities as told by your health care provider. Ask your health care provider what activities are safe for you. Managing cramping and bloating  Try walking around when you have cramps or feel bloated. If directed, apply heat to your abdomen as told by your health care provider. Use the heat source that your health care provider recommends, such as a moist heat pack or a heating pad. Place a towel between your skin and the heat source. Leave the heat on for 20-30 minutes. Remove the heat if your skin turns bright red. This is especially important if you are unable to feel pain, heat, or cold. You have a greater risk of getting burned. General instructions If you were given a sedative during the procedure, it can affect you for several hours. Do not drive or operate machinery until your health care provider says that it is safe. For the first 24 hours after the procedure: Do not sign important documents. Do not drink alcohol. Do your regular daily activities at a slower pace than normal. Eat soft foods that are easy to digest. Take over-the-counter and prescription medicines only as told by your  health care provider. Keep all follow-up visits. This is important. Contact a health care provider if: You have blood in your stool 2-3 days after the procedure. Get help right away if: You have more than a small spotting of blood in your stool. You have large blood clots in your stool. You have swelling of your abdomen. You have nausea or vomiting. You have a fever. You have increasing pain in your abdomen that is not relieved with medicine. These symptoms may be an emergency. Get help right away. Call 911. Do not wait to see if the symptoms will go away. Do not drive yourself to the hospital. Summary After the procedure, it is common to have a small amount of blood in your stool. You may also have mild cramping and bloating of your abdomen. If you were given a sedative during the procedure, it can affect you for several hours. Do not drive or operate machinery until your health care provider says that it is safe. Get help right away if you have a lot of blood in your stool, nausea or  vomiting, a fever, or increased pain in your abdomen. This information is not intended to replace advice given to you by your health care provider. Make sure you discuss any questions you have with your health care provider. Document Revised: 05/17/2021 Document Reviewed: 05/17/2021 Elsevier Patient Education  [place] After This sheet gives you information about how to care for yourself after your procedure. Your health care provider may also give you more specific instructions. If you have problems or questions, contact your health care provider. What can I expect after the procedure? After the procedure, it is common to have: Tiredness. Forgetfulness about what happened after the procedure. Impaired judgment for important decisions. Nausea or vomiting. Some difficulty with balance. Follow these instructions at home: For the time period you were told by your  health care provider:     Rest as needed. Do not participate in activities where you could fall or become injured. Do not drive or use machinery. Do not drink alcohol. Do not take sleeping pills or medicines that cause drowsiness. Do not make important decisions or sign legal documents. Do not take care of children on your own. Eating and drinking Follow the diet that is recommended by your health care provider. Drink enough fluid to keep your urine pale yellow. If you vomit: Drink water, juice, or soup when you can drink without vomiting. Make sure you have little or no nausea before eating solid foods. General instructions Have a responsible adult stay with you for the time you are told. It is important to have someone help care for you until you are awake and alert. Take over-the-counter and prescription medicines only as told by your health care provider. If you have sleep apnea, surgery and certain medicines can increase your risk for breathing problems. Follow instructions from your health care provider about wearing your sleep device: Anytime you are sleeping, including during daytime naps. While taking prescription pain medicines, sleeping medicines, or medicines that make you drowsy. Avoid smoking. Keep all follow-up visits as told by your health care provider. This is important. Contact a health care provider if: You keep feeling nauseous or you keep vomiting. You feel light-headed. You are still sleepy or having trouble with balance after 24 hours. You develop a rash. You have a fever. You have redness or swelling around the IV site. Get help right away if: You have trouble breathing. You have new-onset confusion at home. Summary For several hours after your procedure, you may feel tired. You may also be forgetful and have poor judgment. Have a responsible adult stay with you for the time you are told. It is important to have someone help care for you until you are  awake and alert. Rest as told. Do not drive or operate machinery. Do not drink alcohol or take sleeping pills. Get help right away if you have trouble breathing, or if you suddenly become confused. This information is not intended to replace advice given to you by your health care provider. Make sure you discuss any questions you have with your health care provider. Document Revised: 08/29/2021 Document Reviewed: 08/27/2019 Elsevier Patient Education  [place].

## 2022-03-13 ENCOUNTER — Encounter (HOSPITAL_COMMUNITY): Payer: Self-pay

## 2022-03-13 ENCOUNTER — Encounter (HOSPITAL_COMMUNITY)
Admission: RE | Admit: 2022-03-13 | Discharge: 2022-03-13 | Disposition: A | Discharge status: Home / Self Care | Payer: Medicare Other | Source: Ambulatory Visit | Attending: Internal Medicine | Admitting: Internal Medicine

## 2022-03-13 ENCOUNTER — Other Ambulatory Visit: Payer: Self-pay

## 2022-03-13 VITALS — BP 117/70 | HR 97 | Temp 97.4°F | Resp 18 | Ht 64.0 in | Wt 138.4 lb

## 2022-03-13 DIAGNOSIS — F17200 Nicotine dependence, unspecified, uncomplicated: Secondary | ICD-10-CM

## 2022-03-13 DIAGNOSIS — Z01810 Encounter for preprocedural cardiovascular examination: Secondary | ICD-10-CM | POA: Diagnosis present

## 2022-03-15 ENCOUNTER — Ambulatory Visit (HOSPITAL_COMMUNITY): Payer: Medicare Other | Admitting: Anesthesiology

## 2022-03-15 ENCOUNTER — Ambulatory Visit (HOSPITAL_BASED_OUTPATIENT_CLINIC_OR_DEPARTMENT_OTHER): Payer: Medicare Other | Admitting: Anesthesiology

## 2022-03-15 ENCOUNTER — Ambulatory Visit (HOSPITAL_COMMUNITY)
Admission: RE | Admit: 2022-03-15 | Discharge: 2022-03-15 | Disposition: A | Discharge status: Home / Self Care | Payer: Medicare Other | Attending: Internal Medicine | Admitting: Internal Medicine

## 2022-03-15 ENCOUNTER — Encounter (HOSPITAL_COMMUNITY): Payer: Self-pay

## 2022-03-15 ENCOUNTER — Encounter (HOSPITAL_COMMUNITY)
Admission: RE | Disposition: A | Discharge status: Home / Self Care | Payer: Self-pay | Source: Home / Self Care | Attending: Internal Medicine

## 2022-03-15 DIAGNOSIS — D509 Iron deficiency anemia, unspecified: Secondary | ICD-10-CM

## 2022-03-15 DIAGNOSIS — A0472 Enterocolitis due to Clostridium difficile, not specified as recurrent: Secondary | ICD-10-CM

## 2022-03-15 DIAGNOSIS — F411 Generalized anxiety disorder: Secondary | ICD-10-CM

## 2022-03-15 DIAGNOSIS — K3189 Other diseases of stomach and duodenum: Secondary | ICD-10-CM

## 2022-03-15 DIAGNOSIS — J449 Chronic obstructive pulmonary disease, unspecified: Secondary | ICD-10-CM | POA: Diagnosis not present

## 2022-03-15 DIAGNOSIS — Z720 Tobacco use: Secondary | ICD-10-CM

## 2022-03-15 DIAGNOSIS — F418 Other specified anxiety disorders: Secondary | ICD-10-CM

## 2022-03-15 DIAGNOSIS — K259 Gastric ulcer, unspecified as acute or chronic, without hemorrhage or perforation: Secondary | ICD-10-CM | POA: Insufficient documentation

## 2022-03-15 DIAGNOSIS — G9340 Encephalopathy, unspecified: Secondary | ICD-10-CM

## 2022-03-15 DIAGNOSIS — Z860101 Personal history of adenomatous and serrated colon polyps: Secondary | ICD-10-CM

## 2022-03-15 DIAGNOSIS — N764 Abscess of vulva: Secondary | ICD-10-CM

## 2022-03-15 DIAGNOSIS — R195 Other fecal abnormalities: Secondary | ICD-10-CM

## 2022-03-15 DIAGNOSIS — F419 Anxiety disorder, unspecified: Secondary | ICD-10-CM | POA: Insufficient documentation

## 2022-03-15 DIAGNOSIS — F32A Depression, unspecified: Secondary | ICD-10-CM | POA: Diagnosis not present

## 2022-03-15 DIAGNOSIS — M542 Cervicalgia: Secondary | ICD-10-CM

## 2022-03-15 DIAGNOSIS — G894 Chronic pain syndrome: Secondary | ICD-10-CM

## 2022-03-15 DIAGNOSIS — K449 Diaphragmatic hernia without obstruction or gangrene: Secondary | ICD-10-CM

## 2022-03-15 DIAGNOSIS — E860 Dehydration: Secondary | ICD-10-CM

## 2022-03-15 DIAGNOSIS — M1711 Unilateral primary osteoarthritis, right knee: Secondary | ICD-10-CM

## 2022-03-15 DIAGNOSIS — K2100 Gastro-esophageal reflux disease with esophagitis, without bleeding: Secondary | ICD-10-CM

## 2022-03-15 DIAGNOSIS — D62 Acute posthemorrhagic anemia: Secondary | ICD-10-CM

## 2022-03-15 DIAGNOSIS — B3781 Candidal esophagitis: Secondary | ICD-10-CM | POA: Insufficient documentation

## 2022-03-15 DIAGNOSIS — K219 Gastro-esophageal reflux disease without esophagitis: Secondary | ICD-10-CM | POA: Insufficient documentation

## 2022-03-15 DIAGNOSIS — Z79899 Other long term (current) drug therapy: Secondary | ICD-10-CM | POA: Diagnosis not present

## 2022-03-15 DIAGNOSIS — K921 Melena: Secondary | ICD-10-CM

## 2022-03-15 DIAGNOSIS — M62838 Other muscle spasm: Secondary | ICD-10-CM

## 2022-03-15 DIAGNOSIS — Z91199 Patient's noncompliance with other medical treatment and regimen due to unspecified reason: Secondary | ICD-10-CM

## 2022-03-15 DIAGNOSIS — F17200 Nicotine dependence, unspecified, uncomplicated: Secondary | ICD-10-CM

## 2022-03-15 DIAGNOSIS — J441 Chronic obstructive pulmonary disease with (acute) exacerbation: Secondary | ICD-10-CM

## 2022-03-15 DIAGNOSIS — R110 Nausea: Secondary | ICD-10-CM

## 2022-03-15 DIAGNOSIS — K279 Peptic ulcer, site unspecified, unspecified as acute or chronic, without hemorrhage or perforation: Secondary | ICD-10-CM

## 2022-03-15 DIAGNOSIS — W1830XA Fall on same level, unspecified, initial encounter: Secondary | ICD-10-CM

## 2022-03-15 DIAGNOSIS — N19 Unspecified kidney failure: Secondary | ICD-10-CM

## 2022-03-15 DIAGNOSIS — G9341 Metabolic encephalopathy: Secondary | ICD-10-CM

## 2022-03-15 DIAGNOSIS — M7989 Other specified soft tissue disorders: Secondary | ICD-10-CM

## 2022-03-15 DIAGNOSIS — K922 Gastrointestinal hemorrhage, unspecified: Secondary | ICD-10-CM

## 2022-03-15 DIAGNOSIS — K567 Ileus, unspecified: Secondary | ICD-10-CM

## 2022-03-15 DIAGNOSIS — M25561 Pain in right knee: Secondary | ICD-10-CM

## 2022-03-15 DIAGNOSIS — Z86010 Personal history of colonic polyps: Secondary | ICD-10-CM

## 2022-03-15 HISTORY — PX: ESOPHAGEAL BRUSHING: SHX6842

## 2022-03-15 HISTORY — PX: ESOPHAGOGASTRODUODENOSCOPY (EGD) WITH PROPOFOL: SHX5813

## 2022-03-15 HISTORY — PX: COLONOSCOPY WITH PROPOFOL: SHX5780

## 2022-03-15 HISTORY — PX: BIOPSY: SHX5522

## 2022-03-15 SURGERY — COLONOSCOPY WITH PROPOFOL
Anesthesia: General

## 2022-03-15 MED ORDER — SODIUM CHLORIDE FLUSH 0.9 % IV SOLN
INTRAVENOUS | Status: AC
  Filled 2022-03-15: qty 10

## 2022-03-15 MED ORDER — LACTATED RINGERS IV SOLN: INTRAVENOUS | Status: DC

## 2022-03-15 MED ORDER — PROPOFOL 500 MG/50ML IV EMUL
INTRAVENOUS | Status: AC
  Filled 2022-03-15: qty 50

## 2022-03-15 MED ORDER — ETOMIDATE 2 MG/ML IV SOLN
INTRAVENOUS | Status: DC | PRN
  Administered 2022-03-15: 6 mg via INTRAVENOUS

## 2022-03-15 MED ORDER — ETOMIDATE 2 MG/ML IV SOLN
INTRAVENOUS | Status: AC
  Filled 2022-03-15: qty 10

## 2022-03-15 MED ORDER — PROPOFOL 500 MG/50ML IV EMUL
INTRAVENOUS | Status: DC | PRN
  Administered 2022-03-15: 180 ug/kg/min via INTRAVENOUS

## 2022-03-15 MED ORDER — PROPOFOL 10 MG/ML IV BOLUS
INTRAVENOUS | Status: DC | PRN
  Administered 2022-03-15: 50 mg via INTRAVENOUS

## 2022-03-15 NOTE — Transfer of Care (Signed)
Immediate Anesthesia Transfer of Care Note  Patient: [name]  Procedure(s) Performed: COLONOSCOPY WITH PROPOFOL ESOPHAGOGASTRODUODENOSCOPY (EGD) WITH PROPOFOL ESOPHAGEAL BRUSHING BIOPSY  Patient Location: PACU  Anesthesia Type:General  Level of Consciousness: awake, alert  and oriented  Airway & Oxygen Therapy: Patient Spontanous Breathing  Post-op Assessment: Report given to RN, Post -op Vital signs reviewed and stable, Patient moving all extremities X 4 and Patient able to stick tongue midline  Post vital signs: Reviewed  Last Vitals:  Vitals Value Taken Time  BP 103/59 03/15/22 1027  Temp 36.4 C 03/15/22 1027  Pulse 71 03/15/22 1027  Resp 17 03/15/22 1027  SpO2 100 % 03/15/22 1027    Last Pain:  Vitals:   03/15/22 1027  TempSrc: Oral  PainSc: 0-No pain      Patients Stated Pain Goal: 6 (92/11/94 1740)  Complications: No notable events documented.

## 2022-03-15 NOTE — Anesthesia Preprocedure Evaluation (Addendum)
Anesthesia Evaluation  Patient identified by MRN, date of birth, ID band Patient awake    Reviewed: Allergy & Precautions, H&P , NPO status , Patient's Chart, lab work & pertinent test results, reviewed documented beta blocker date and time   Airway Mallampati: II  TM Distance: >3 FB Neck ROM: full    Dental no notable dental hx. (+) Partial Upper, Partial Lower   Pulmonary asthma , COPD, Current Smoker,    Pulmonary exam normal breath sounds clear to auscultation       Cardiovascular Exercise Tolerance: Good negative cardio ROS   Rhythm:regular Rate:Normal     Neuro/Psych  Headaches, PSYCHIATRIC DISORDERS Anxiety Depression  Neuromuscular disease    GI/Hepatic Neg liver ROS, hiatal hernia, PUD, GERD  Medicated,  Endo/Other  negative endocrine ROS  Renal/GU negative Renal ROS  negative genitourinary   Musculoskeletal   Abdominal   Peds  Hematology  (+) Blood dyscrasia, anemia ,   Anesthesia Other Findings   Reproductive/Obstetrics negative OB ROS                            Anesthesia Physical Anesthesia Plan  ASA: 3  Anesthesia Plan: General   Post-op Pain Management:    Induction:   PONV Risk Score and Plan: Propofol infusion  Airway Management Planned:   Additional Equipment:   Intra-op Plan:   Post-operative Plan:   Informed Consent: I have reviewed the patients History and Physical, chart, labs and discussed the procedure including the risks, benefits and alternatives for the proposed anesthesia with the patient or authorized representative who has indicated his/her understanding and acceptance.     Dental Advisory Given  Plan Discussed with: CRNA  Anesthesia Plan Comments:         Anesthesia Quick Evaluation

## 2022-03-15 NOTE — Op Note (Signed)
[facility] Patient Name: [name] Procedure Date: 03/15/2022 9:48 AM MRN: [id number] Date of Birth: 12/29/1960 Attending MD: Elon Alas. Edgar Frisk CSN: [id number] Age: 61 Admit Type: Outpatient Procedure:                Upper GI endoscopy Indications:              Iron deficiency anemia, Heme positive stool Providers:                Elon Alas. Abbey Chatters, DO, Charlsie Quest. Insurance claims handler, Therapist, sports,                            Suzan Garibaldi. Risa Grill, Technician Referring MD:              Medicines:                See the Anesthesia note for documentation of the                            administered medications Complications:            No immediate complications. Estimated Blood Loss:     Estimated blood loss was minimal. Procedure:                Pre-Anesthesia Assessment:                           - The anesthesia plan was to use monitored                            anesthesia care (MAC).                           After obtaining informed consent, the endoscope was                            passed under direct vision. Throughout the                            procedure, the patient's blood pressure, pulse, and                            oxygen saturations were monitored continuously. The                            [id number]-H190 ([id number]) scope was introduced through the                            mouth, and advanced to the second part of duodenum.                            The upper GI endoscopy was accomplished without                            difficulty. The patient tolerated the procedure  well. Scope In: 10:05:09 AM Scope Out: 10:11:19 AM Total Procedure Duration: 0 hours 6 minutes 10 seconds  Findings:      Non-severe esophagitis with no bleeding was found in the middle third of       the esophagus. Cells for cytology were obtained by brushing.      A large hiatal hernia with multiple Cameron ulcers was found.      Diffuse mildly erythematous mucosa  without bleeding was found in the       entire examined stomach. Biopsies were taken with a cold forceps for       Helicobacter pylori testing.      The duodenal bulb, first portion of the duodenum and second portion of       the duodenum were normal. Impression:               - Non-severe candidiasis esophagitis with no                            bleeding. Cells for cytology obtained.                           - Large hiatal hernia with multiple Cameron ulcers.                           - Erythematous mucosa in the stomach. Biopsied.                           - Normal duodenal bulb, first portion of the                            duodenum and second portion of the duodenum. Moderate Sedation:      Per Anesthesia Care Recommendation:           - Patient has a contact number available for                            emergencies. The signs and symptoms of potential                            delayed complications were discussed with the                            patient. Return to normal activities tomorrow.                            Written discharge instructions were provided to the                            patient.                           - Resume previous diet.                           - Continue present medications.                           -  Await pathology results.                           - Use Protonix (pantoprazole) 40 mg PO BID.                           - Treat for candidal esophagitis if cytology                            positive. Procedure Code(s):        --- Professional ---                           [phone], Esophagogastroduodenoscopy, flexible,                            transoral; with biopsy, single or multiple Diagnosis Code(s):        --- Professional ---                           B37.81, Candidal esophagitis                           K44.9, Diaphragmatic hernia without obstruction or                            gangrene                           K25.9,  Gastric ulcer, unspecified as acute or                            chronic, without hemorrhage or perforation                           K31.89, Other diseases of stomach and duodenum                           D50.9, Iron deficiency anemia, unspecified                           R19.5, Other fecal abnormalities CPT copyright 2019 American Medical Association. All rights reserved. The codes documented in this report are preliminary and upon coder review may  be revised to meet current compliance requirements. Elon Alas. Abbey Chatters, DO [place] Abbey Chatters, DO 03/15/2022 10:15:12 AM This report has been signed electronically. Number of Addenda: 0

## 2022-03-15 NOTE — Anesthesia Postprocedure Evaluation (Signed)
Anesthesia Post Note  Patient: [name]  Procedure(s) Performed: COLONOSCOPY WITH PROPOFOL ESOPHAGOGASTRODUODENOSCOPY (EGD) WITH PROPOFOL ESOPHAGEAL BRUSHING BIOPSY  Patient location during evaluation: Phase II Anesthesia Type: General Level of consciousness: awake Pain management: pain level controlled Vital Signs Assessment: post-procedure vital signs reviewed and stable Respiratory status: spontaneous breathing and respiratory function stable Cardiovascular status: blood pressure returned to baseline and stable Postop Assessment: no headache and no apparent nausea or vomiting Anesthetic complications: no Comments: Late entry   No notable events documented.   Last Vitals:  Vitals:   03/15/22 0909 03/15/22 1027  BP: 119/75 (!) 103/59  Pulse:  71  Resp: 16 17  Temp: 36.6 C (!) 36.4 C  SpO2: 97% 100%    Last Pain:  Vitals:   03/15/22 1027  TempSrc: Oral  PainSc: 0-No pain                 Louann Sjogren

## 2022-03-15 NOTE — Discharge Instructions (Addendum)
EGD Discharge instructions Please read the instructions outlined below and refer to this sheet in the next few weeks. These discharge instructions provide you with general information on caring for yourself after you leave the hospital. Your doctor may also give you specific instructions. While your treatment has been planned according to the most current medical practices available, unavoidable complications occasionally occur. If you have any problems or questions after discharge, please call your doctor. ACTIVITY You may resume your regular activity but move at a slower pace for the next 24 hours.  Take frequent rest periods for the next 24 hours.  Walking will help expel (get rid of) the air and reduce the bloated feeling in your abdomen.  No driving for 24 hours (because of the anesthesia (medicine) used during the test).  You may shower.  Do not sign any important legal documents or operate any machinery for 24 hours (because of the anesthesia used during the test).  NUTRITION Drink plenty of fluids.  You may resume your normal diet.  Begin with a light meal and progress to your normal diet.  Avoid alcoholic beverages for 24 hours or as instructed by your caregiver.  MEDICATIONS You may resume your normal medications unless your caregiver tells you otherwise.  WHAT YOU CAN EXPECT TODAY You may experience abdominal discomfort such as a feeling of fullness or "gas" pains.  FOLLOW-UP Your doctor will discuss the results of your test with you.  SEEK IMMEDIATE MEDICAL ATTENTION IF ANY OF THE FOLLOWING OCCUR: Excessive nausea (feeling sick to your stomach) and/or vomiting.  Severe abdominal pain and distention (swelling).  Trouble swallowing.  Temperature over 101 F (37.8 C).  Rectal bleeding or vomiting of blood.     Colonoscopy Discharge Instructions  Read the instructions outlined below and refer to this sheet in the next few weeks. These discharge instructions provide you with  general information on caring for yourself after you leave the hospital. Your doctor may also give you specific instructions. While your treatment has been planned according to the most current medical practices available, unavoidable complications occasionally occur.   ACTIVITY You may resume your regular activity, but move at a slower pace for the next 24 hours.  Take frequent rest periods for the next 24 hours.  Walking will help get rid of the air and reduce the bloated feeling in your belly (abdomen).  No driving for 24 hours (because of the medicine (anesthesia) used during the test).   Do not sign any important legal documents or operate any machinery for 24 hours (because of the anesthesia used during the test).  NUTRITION Drink plenty of fluids.  You may resume your normal diet as instructed by your doctor.  Begin with a light meal and progress to your normal diet. Heavy or fried foods are harder to digest and may make you feel sick to your stomach (nauseated).  Avoid alcoholic beverages for 24 hours or as instructed.  MEDICATIONS You may resume your normal medications unless your doctor tells you otherwise.  WHAT YOU CAN EXPECT TODAY Some feelings of bloating in the abdomen.  Passage of more gas than usual.  Spotting of blood in your stool or on the toilet paper.  IF YOU HAD POLYPS REMOVED DURING THE COLONOSCOPY: No aspirin products for 7 days or as instructed.  No alcohol for 7 days or as instructed.  Eat a soft diet for the next 24 hours.  FINDING OUT THE RESULTS OF YOUR TEST Not all test results are  available during your visit. If your test results are not back during the visit, make an appointment with your caregiver to find out the results. Do not assume everything is normal if you have not heard from your caregiver or the medical facility. It is important for you to follow up on all of your test results.  SEEK IMMEDIATE MEDICAL ATTENTION IF: You have more than a spotting of  blood in your stool.  Your belly is swollen (abdominal distention).  You are nauseated or vomiting.  You have a temperature over 101.  You have abdominal pain or discomfort that is severe or gets worse throughout the day.   Your upper endoscopy again showed large hiatal hernia with a few Cameron erosions.  No active bleeding.  I took biopsies of your stomach to rule out infection with a bacteria called H. pylori.  Also appears that you may have candidal esophagitis which is a yeast infection.  I took samples of your esophagus as well.  Await pathology results, my office will contact you.  Continue on pantoprazole twice daily.  Unfortunately, your colon was not adequately prepped today for colonoscopy.  I did not see any evidence of colon cancer or large polyps, but certainly could have missed smaller polyps due to poor visualization.  Would recommend repeat colonoscopy in 3-6 months with a different colon prep.   I hope you have a great rest of your week!  Elon Alas. Abbey Chatters, D.O. Gastroenterology and Hepatology [facility]

## 2022-03-15 NOTE — Op Note (Signed)
[facility] Patient Name: [name] Procedure Date: 03/15/2022 10:13 AM MRN: [id number] Date of Birth: 02-Mar-1961 Attending MD: Elon Alas. Edgar Frisk CSN: [id number] Age: 61 Admit Type: Outpatient Procedure:                Colonoscopy Indications:              Heme positive stool, Iron deficiency anemia Providers:                Elon Alas. Abbey Chatters, DO, Charlsie Quest. Insurance claims handler, Therapist, sports,                            Suzan Garibaldi. Risa Grill, Technician Referring MD:              Medicines:                See the Anesthesia note for documentation of the                            administered medications Complications:            No immediate complications. Estimated Blood Loss:     Estimated blood loss: none. Procedure:                Pre-Anesthesia Assessment:                           - The anesthesia plan was to use monitored                            anesthesia care (MAC).                           After obtaining informed consent, the colonoscope                            was passed under direct vision. Throughout the                            procedure, the patient's blood pressure, pulse, and                            oxygen saturations were monitored continuously. The                            PCF-HQ190L ([id number]) scope was introduced through                            the anus and advanced to the the cecum, identified                            by appendiceal orifice and ileocecal valve. The                            colonoscopy was performed without difficulty. The                            patient tolerated  the procedure well. The quality                            of the bowel preparation was evaluated using the                            BBPS [facility] Bowel Preparation Scale) with scores                            of: Right Colon = 1 (portion of mucosa seen, but                            other areas not well seen due to staining, residual                             stool and/or opaque liquid), Transverse Colon = 1                            (portion of mucosa seen, but other areas not well                            seen due to staining, residual stool and/or opaque                            liquid) and Left Colon = 1 (portion of mucosa seen,                            but other areas not well seen due to staining,                            residual stool and/or opaque liquid). The total                            BBPS score equals 3. The quality of the bowel                            preparation was inadequate. Scope In: 10:16:08 AM Scope Out: 10:21:48 AM Scope Withdrawal Time: 0 hours 0 minutes 52 seconds  Total Procedure Duration: 0 hours 5 minutes 40 seconds  Findings:      The perianal and digital rectal examinations were normal.      Extensive amounts of semi-liquid stool was found in the entire colon,       precluding visualization. Lavage of the area was performed using copious       amounts of sterile water, resulting in incomplete clearance with       continued poor visualization. Impression:               - Preparation of the colon was inadequate.                           - Stool in the entire examined colon.                           -  No specimens collected. Moderate Sedation:      Per Anesthesia Care Recommendation:           - Patient has a contact number available for                            emergencies. The signs and symptoms of potential                            delayed complications were discussed with the                            patient. Return to normal activities tomorrow.                            Written discharge instructions were provided to the                            patient.                           - Resume previous diet.                           - Continue present medications.                           - Repeat colonoscopy in 3-6 months because the                            bowel preparation was  poor. Procedure Code(s):        --- Professional ---                           [phone], Colonoscopy, flexible; diagnostic, including                            collection of specimen(s) by brushing or washing,                            when performed (separate procedure) Diagnosis Code(s):        --- Professional ---                           R19.5, Other fecal abnormalities                           D50.9, Iron deficiency anemia, unspecified CPT copyright 2019 American Medical Association. All rights reserved. The codes documented in this report are preliminary and upon coder review may  be revised to meet current compliance requirements. Elon Alas. Abbey Chatters, DO [employer] Abbey Chatters, DO 03/15/2022 10:24:25 AM This report has been signed electronically. Number of Addenda: 0

## 2022-03-15 NOTE — Interval H&P Note (Signed)
History and Physical Interval Note:  03/15/2022 9:48 AM  [name]  has presented today for surgery, with the diagnosis of Anemia, heme positive stool, Cameron lesion, GERD.  The various methods of treatment have been discussed with the patient and family. After consideration of risks, benefits and other options for treatment, the patient has consented to  Procedure(s) with comments: COLONOSCOPY WITH PROPOFOL (N/A) - 10:00am ESOPHAGOGASTRODUODENOSCOPY (EGD) WITH PROPOFOL (N/A) as a surgical intervention.  The patient's history has been reviewed, patient examined, no change in status, stable for surgery.  I have reviewed the patient's chart and labs.  Questions were answered to the patient's satisfaction.     [name]

## 2022-03-16 ENCOUNTER — Other Ambulatory Visit: Payer: Self-pay | Admitting: Internal Medicine

## 2022-03-16 DIAGNOSIS — R1319 Other dysphagia: Secondary | ICD-10-CM

## 2022-03-16 LAB — CYTOLOGY - NON PAP

## 2022-03-19 LAB — SURGICAL PATHOLOGY

## 2022-03-22 ENCOUNTER — Encounter (HOSPITAL_COMMUNITY): Payer: Self-pay | Admitting: Internal Medicine

## 2022-03-26 ENCOUNTER — Other Ambulatory Visit: Payer: Self-pay | Admitting: Internal Medicine

## 2022-03-26 MED ORDER — FLUCONAZOLE 200 MG PO TABS: ORAL_TABLET | ORAL | 0 refills | Status: AC

## 2022-09-13 ENCOUNTER — Ambulatory Visit: Payer: Medicare Other | Admitting: Internal Medicine

## 2023-02-28 ENCOUNTER — Encounter: Payer: Self-pay | Admitting: *Deleted

## 2023-03-19 ENCOUNTER — Other Ambulatory Visit: Payer: Self-pay | Admitting: Internal Medicine

## 2023-04-17 ENCOUNTER — Telehealth: Payer: Self-pay

## 2023-04-17 NOTE — Telephone Encounter (Signed)
Pt called and left a message requesting a return phone call from the nurse.

## 2023-04-19 ENCOUNTER — Telehealth: Payer: Self-pay | Admitting: *Deleted

## 2023-04-19 NOTE — Telephone Encounter (Signed)
Returned the pt's call and it was regarding scheduling a colonoscopy. Transferred to the schedulers  FYI: Then pt had another issue she had been vomiting all night into this am, advised the pt to proceed to the ED to be evaluated. Advised the pt that she did not need to get dehydrated. Pt expressed that she thought she would have to but she will continue to wait and see if it resolves on it's own. Pt advised again to proceed to the ED because we close at 12 today.

## 2023-04-19 NOTE — Telephone Encounter (Signed)
Received VM from pt stating she needed to schedule a colonoscopy with Dr. Marletta Lor. Pulled reports from 2023. She had poor bowel prep and advised to repeat 3-6 months.   Called pt, she stated the prep she was given was clenpiq and she did not have 1 bowel movement at all. I advised her this time she will do extended prep and will call once we have schedule available. She voiced understanding.  Dr. Marletta Lor, please advise her ASA thanks!

## 2023-04-30 MED ORDER — PEG 3350-KCL-NA BICARB-NACL 420 G PO SOLR: 4000.0000 mL | Freq: Once | ORAL | 0 refills | Status: AC

## 2023-04-30 NOTE — Telephone Encounter (Signed)
Pt aware of preop appt.

## 2023-04-30 NOTE — Telephone Encounter (Signed)
Spoke with pt. She has been scheduled for 8/12. Aware will send instructions to her. Rx for prep to pharmacy. Will call back with pre-op appt.

## 2023-04-30 NOTE — Addendum Note (Signed)
Addended by: Armstead Peaks on: 04/30/2023 09:01 AM   Modules accepted: Orders

## 2023-05-14 ENCOUNTER — Telehealth: Payer: Self-pay | Admitting: *Deleted

## 2023-05-14 NOTE — Pre-Procedure Instructions (Signed)
Attempted pre-op phone call. Left VM for her to call us back. 

## 2023-05-14 NOTE — Telephone Encounter (Signed)
Pt called to cancel her procedure on 05/20/23 with Dr.Carver.  She will be rescheduled when we get providers September schedule. FYI

## 2023-05-15 ENCOUNTER — Encounter (HOSPITAL_COMMUNITY)
Admission: RE | Admit: 2023-05-15 | Discharge: 2023-05-15 | Disposition: A | Discharge status: Home / Self Care | Payer: 59 | Source: Ambulatory Visit | Attending: Internal Medicine | Admitting: Internal Medicine

## 2023-05-20 ENCOUNTER — Ambulatory Visit (HOSPITAL_COMMUNITY): Admission: RE | Admit: 2023-05-20 | Payer: 59 | Source: Home / Self Care

## 2023-05-20 ENCOUNTER — Encounter (HOSPITAL_COMMUNITY): Admission: RE | Payer: Self-pay | Source: Home / Self Care

## 2023-05-20 SURGERY — COLONOSCOPY WITH PROPOFOL
Anesthesia: Monitor Anesthesia Care

## 2023-05-22 NOTE — Telephone Encounter (Signed)
LMTRC to schedule colonoscopy

## 2023-07-12 ENCOUNTER — Encounter: Payer: Self-pay | Admitting: *Deleted

## 2023-07-12 NOTE — Telephone Encounter (Signed)
Mailed letter

## 2023-11-10 NOTE — Progress Notes (Deleted)
 [name], female    DOB: December 27, 1960    MRN: [id number]   Brief patient profile:  63  yo*** *** referred to pulmonary clinic in [place]  11/12/2023 by *** for ***      History of Present Illness  11/12/2023  Pulmonary/ 1st office eval/ [facility] /  Office  No chief complaint on file.    Dyspnea:  *** Cough: *** Sleep: *** SABA use: *** 02: *** LDSCT:***  No obvious day to day or daytime pattern/variability or assoc excess/ purulent sputum or mucus plugs or hemoptysis or cp or chest tightness, subjective wheeze or overt sinus or hb symptoms.    Also denies any obvious fluctuation of symptoms with weather or environmental changes or other aggravating or alleviating factors except as outlined above   No unusual exposure hx or h/o childhood pna/ asthma or knowledge of premature birth.  Current Allergies, Complete Past Medical History, Past Surgical History, Family History, and Social History were reviewed in [employer] record.  ROS  The following are not active complaints unless bolded Hoarseness, sore throat, dysphagia, dental problems, itching, sneezing,  nasal congestion or discharge of excess mucus or purulent secretions, ear ache,   fever, chills, sweats, unintended wt loss or wt gain, classically pleuritic or exertional cp,  orthopnea pnd or arm/hand swelling  or leg swelling, presyncope, palpitations, abdominal pain, anorexia, nausea, vomiting, diarrhea  or change in bowel habits or change in bladder habits, change in stools or change in urine, dysuria, hematuria,  rash, arthralgias, visual complaints, headache, numbness, weakness or ataxia or problems with walking or coordination,  change in mood or  memory.            Outpatient Medications Prior to Visit  Medication Sig Dispense Refill   albuterol  (PROVENTIL ) (2.5 MG/3ML) 0.083% nebulizer solution Take 2.5 mg by nebulization every 6 (six) hours as needed for wheezing or shortness of  breath.     albuterol  (PROVENTIL ,VENTOLIN ) 90 MCG/ACT inhaler Inhale 2 puffs into the lungs every 6 (six) hours as needed for wheezing or shortness of breath. For shortness of breath     ALPRAZolam  (XANAX ) 1 MG tablet Take 1 mg by mouth at bedtime.     Aspirin-Acetaminophen -Caffeine  (EXCEDRIN MIGRAINE PO) Take 2 tablets by mouth daily as needed (migraine).     DULoxetine  (CYMBALTA ) 30 MG capsule Take 30 mg by mouth daily. Take with 60 mg     DULoxetine  (CYMBALTA ) 60 MG capsule Take 60 mg by mouth daily. Take with 30 mg     fluconazole  (DIFLUCAN ) 200 MG tablet Take 2 tablets on day 1 then 1 tablet daily thereafter for a total of 14 days. 15 tablet 0   gabapentin  (NEURONTIN ) 400 MG capsule Take 400 mg by mouth 3 (three) times daily.     HYDROcodone -acetaminophen  (NORCO/VICODIN) 5-325 MG tablet Take 1 tablet by mouth 3 (three) times daily as needed for moderate pain.     lidocaine  (LIDODERM ) 5 % Place 1 patch onto the skin daily as needed (pain).     OLANZapine  (ZYPREXA ) 15 MG tablet Take 15 mg by mouth at bedtime.     pantoprazole  (PROTONIX ) 40 MG tablet TAKE 1 TABLET BY MOUTH TWICE DAILY 60 tablet 11   propranolol  (INDERAL ) 10 MG tablet Take 0.5 tablets (5 mg total) by mouth 2 (two) times daily. (Patient taking differently: Take 10 mg by mouth 2 (two) times daily.)     tiZANidine (ZANAFLEX) 2 MG tablet Take 2 mg by mouth  2 (two) times daily as needed for muscle spasms.     traZODone  (DESYREL ) 50 MG tablet Take 50 mg by mouth at bedtime.  1   No facility-administered medications prior to visit.    Past Medical History:  Diagnosis Date   ADHD (attention deficit hyperactivity disorder)    Anginal pain (HCC)    Asthma    BMI (body mass index) 20.0-29.9 2009 127 LBS   Chronic abdominal pain 2003   EX LAP APR 2004 RUPTURE L OV CYST, JUL 2004 ADHESIONS   Chronic nausea    Clostridium difficile colitis 04/10/11 &11/2011   tx w/ flagyl    COPD (chronic obstructive pulmonary disease) (HCC)     Degenerative disc disease    Depression    GERD (gastroesophageal reflux disease)    Hiatal hernia 2008   on EGD above   Irritable bowel syndrome 2004 CONSTIPATION   Migraines    MRSA infection    Dr. Sybil, currently under treatment   PTSD (post-traumatic stress disorder)    PUD (peptic ulcer disease) 01/2007   EGD Dr Shaaron, 2 antral ulcers, negative h pylori   Rectal prolapse    S/P colonoscopy 06/02/09   normal (Dr. Jeryl)   S/P endoscopy 03/26/11   gastritis-Dr Harvey   SBO (small bowel obstruction) (HCC)       Objective:     There were no vitals taken for this visit.         Assessment   No problem-specific Assessment & Plan notes found for this encounter.     Ozell America, MD 11/10/2023

## 2023-11-12 ENCOUNTER — Encounter: Payer: Self-pay | Admitting: Internal Medicine

## 2023-11-12 ENCOUNTER — Institutional Professional Consult (permissible substitution): Payer: 59 | Admitting: Internal Medicine

## 2023-12-12 ENCOUNTER — Encounter (HOSPITAL_COMMUNITY): Payer: Self-pay

## 2023-12-12 ENCOUNTER — Emergency Department (HOSPITAL_COMMUNITY)

## 2023-12-12 ENCOUNTER — Emergency Department (HOSPITAL_COMMUNITY)
Admission: EM | Admit: 2023-12-12 | Discharge: 2023-12-12 | Disposition: A | Discharge status: Home / Self Care | Attending: Emergency Medicine | Admitting: Emergency Medicine

## 2023-12-12 ENCOUNTER — Other Ambulatory Visit: Payer: Self-pay

## 2023-12-12 DIAGNOSIS — J45909 Unspecified asthma, uncomplicated: Secondary | ICD-10-CM | POA: Insufficient documentation

## 2023-12-12 DIAGNOSIS — K920 Hematemesis: Secondary | ICD-10-CM | POA: Insufficient documentation

## 2023-12-12 DIAGNOSIS — F17210 Nicotine dependence, cigarettes, uncomplicated: Secondary | ICD-10-CM | POA: Insufficient documentation

## 2023-12-12 DIAGNOSIS — J449 Chronic obstructive pulmonary disease, unspecified: Secondary | ICD-10-CM | POA: Insufficient documentation

## 2023-12-12 DIAGNOSIS — R1013 Epigastric pain: Secondary | ICD-10-CM | POA: Diagnosis not present

## 2023-12-12 DIAGNOSIS — K219 Gastro-esophageal reflux disease without esophagitis: Secondary | ICD-10-CM | POA: Diagnosis not present

## 2023-12-12 DIAGNOSIS — R0602 Shortness of breath: Secondary | ICD-10-CM | POA: Insufficient documentation

## 2023-12-12 DIAGNOSIS — R062 Wheezing: Secondary | ICD-10-CM

## 2023-12-12 LAB — LIPASE, BLOOD: Lipase: 45 U/L (ref 11–51)

## 2023-12-12 LAB — COMPREHENSIVE METABOLIC PANEL
ALT: 17 U/L (ref 0–44)
AST: 19 U/L (ref 15–41)
Albumin: 3.4 g/dL — ABNORMAL LOW (ref 3.5–5.0)
Alkaline Phosphatase: 48 U/L (ref 38–126)
Anion gap: 4 — ABNORMAL LOW (ref 5–15)
BUN: 16 mg/dL (ref 8–23)
CO2: 25 mmol/L (ref 22–32)
Calcium: 8.4 mg/dL — ABNORMAL LOW (ref 8.9–10.3)
Chloride: 110 mmol/L (ref 98–111)
Creatinine, Ser: 0.42 mg/dL — ABNORMAL LOW (ref 0.44–1.00)
GFR, Estimated: >60 mL/min (ref >60)
Glucose, Bld: 112 mg/dL — ABNORMAL HIGH (ref 70–99)
Potassium: 3.2 mmol/L — ABNORMAL LOW (ref 3.5–5.1)
Sodium: 139 mmol/L (ref 135–145)
Total Bilirubin: <0.2 mg/dL (ref 0.0–1.2)
Total Protein: 5.9 g/dL — ABNORMAL LOW (ref 6.5–8.1)

## 2023-12-12 LAB — CBC
HCT: 34.5 % — ABNORMAL LOW (ref 36.0–46.0)
Hemoglobin: 9.9 g/dL — ABNORMAL LOW (ref 12.0–15.0)
MCH: 24.1 pg — ABNORMAL LOW (ref 26.0–34.0)
MCHC: 28.7 g/dL — ABNORMAL LOW (ref 30.0–36.0)
MCV: 83.9 fL (ref 80.0–100.0)
Platelets: 327 10*3/uL (ref 150–400)
RBC: 4.11 MIL/uL (ref 3.87–5.11)
RDW: 19.5 % — ABNORMAL HIGH (ref 11.5–15.5)
WBC: 11.1 10*3/uL — ABNORMAL HIGH (ref 4.0–10.5)
nRBC: 0 % (ref 0.0–0.2)

## 2023-12-12 LAB — PROTIME-INR
INR: 0.9 (ref 0.8–1.2)
Prothrombin Time: 12.1 s (ref 11.4–15.2)

## 2023-12-12 LAB — TYPE AND SCREEN
ABO/RH(D): A POS
Antibody Screen: NEGATIVE

## 2023-12-12 LAB — URINALYSIS, ROUTINE W REFLEX MICROSCOPIC
Bilirubin Urine: NEGATIVE
Glucose, UA: NEGATIVE mg/dL
Hgb urine dipstick: NEGATIVE
Ketones, ur: NEGATIVE mg/dL
Leukocytes,Ua: NEGATIVE
Nitrite: NEGATIVE
Protein, ur: NEGATIVE mg/dL
Specific Gravity, Urine: >1.046 — ABNORMAL HIGH (ref 1.005–1.030)
pH: 5 (ref 5.0–8.0)

## 2023-12-12 LAB — TROPONIN I (HIGH SENSITIVITY)
Troponin I (High Sensitivity): 2 ng/L (ref <18)
Troponin I (High Sensitivity): 2 ng/L (ref <18)

## 2023-12-12 MED ORDER — ONDANSETRON HCL 4 MG/2ML IJ SOLN
4.0000 mg | Freq: Once | INTRAMUSCULAR | Status: AC
  Administered 2023-12-12: 4 mg via INTRAVENOUS
  Filled 2023-12-12: qty 2

## 2023-12-12 MED ORDER — IPRATROPIUM-ALBUTEROL 0.5-2.5 (3) MG/3ML IN SOLN
3.0000 mL | Freq: Once | RESPIRATORY_TRACT | Status: AC
  Administered 2023-12-12: 3 mL via RESPIRATORY_TRACT
  Filled 2023-12-12: qty 3

## 2023-12-12 MED ORDER — FENTANYL CITRATE PF 50 MCG/ML IJ SOSY
50.0000 ug | PREFILLED_SYRINGE | Freq: Once | INTRAMUSCULAR | Status: AC
  Administered 2023-12-12: 50 ug via INTRAVENOUS
  Filled 2023-12-12: qty 1

## 2023-12-12 MED ORDER — SODIUM CHLORIDE 0.9 % IV BOLUS
1000.0000 mL | Freq: Once | INTRAVENOUS | Status: AC
  Administered 2023-12-12: 1000 mL via INTRAVENOUS

## 2023-12-12 MED ORDER — FAMOTIDINE IN NACL 20-0.9 MG/50ML-% IV SOLN
20.0000 mg | Freq: Once | INTRAVENOUS | Status: AC
  Administered 2023-12-12: 20 mg via INTRAVENOUS
  Filled 2023-12-12: qty 50

## 2023-12-12 MED ORDER — SUCRALFATE 1 G PO TABS: 1.0000 g | ORAL_TABLET | Freq: Four times a day (QID) | ORAL | 0 refills | Status: AC | PRN

## 2023-12-12 MED ORDER — IOHEXOL 300 MG/ML  SOLN
100.0000 mL | Freq: Once | INTRAMUSCULAR | Status: AC | PRN
  Administered 2023-12-12: 100 mL via INTRAVENOUS

## 2023-12-12 NOTE — Discharge Instructions (Signed)
 You were evaluated in the Emergency Department and after careful evaluation, we did not find any emergent condition requiring admission or further testing in the hospital.  Your exam/testing today is overall reassuring.  Wheezing likely related to your COPD.  Continue your inhalers at home as needed.  Continue your Protonix medication twice daily.  Can also use the Carafate as needed for discomfort.  Keep your follow-up with gastroenterology.  Please return to the Emergency Department if you experience any worsening of your condition.   Thank you for allowing [place] to be a part of your care.

## 2023-12-12 NOTE — ED Notes (Signed)
 Pt made aware of needing a urine sample. Pt feel back asleep when asked if she could walk or wanted a bedpan.

## 2023-12-12 NOTE — ED Provider Notes (Signed)
 [facility] Emergency Department Provider Note MRN:  [id number]  Arrival date & time: 12/12/23     Chief Complaint   Stomach Ulcer and Hematemesis   History of Present Illness   [name] is a 63 y.o. year-old female with a history of COPD presenting to the ED with chief complaint of hematemesis.  Recent issues with trouble breathing, wheezing.  Also recently diagnosed with a stomach ulcer that she was told was likely caused by frequent use of steroids.  Having some vomiting of blood this evening, 3 episodes.  Has had some black stool recently.  Also having trouble breathing with really bad wheezing at this time.  Denies chest pain.  Review of Systems  A thorough review of systems was obtained and all systems are negative except as noted in the HPI and PMH.   Patient's Health History    Past Medical History:  Diagnosis Date   ADHD (attention deficit hyperactivity disorder)    Anginal pain (HCC)    Asthma    BMI (body mass index) 20.0-29.9 2009 127 LBS   Chronic abdominal pain 2003   EX LAP APR 2004 RUPTURE L OV CYST, JUL 2004 ADHESIONS   Chronic nausea    Clostridium difficile colitis 04/10/11 &11/2011   tx w/ flagyl   COPD (chronic obstructive pulmonary disease) (HCC)    Degenerative disc disease    Depression    GERD (gastroesophageal reflux disease)    Hiatal hernia 2008   on EGD above   Irritable bowel syndrome 2004 CONSTIPATION   Migraines    MRSA infection    Dr. Lenice Pressman, currently under treatment   PTSD (post-traumatic stress disorder)    PUD (peptic ulcer disease) 01/2007   EGD Dr Jena Gauss, 2 antral ulcers, negative h pylori   Rectal prolapse    S/P colonoscopy 06/02/09   normal (Dr. Linna Darner)   S/P endoscopy 03/26/11   gastritis-Dr Darrick Penna   SBO (small bowel obstruction) [facility])     Past Surgical History:  Procedure Laterality Date   ABDOMINAL HYSTERECTOMY     APPENDECTOMY     BACK SURGERY     BIOPSY  06/10/2021   Procedure:  BIOPSY;  Surgeon: Dolores Frame, MD;  Location: [facility];  Service: Gastroenterology;;   BIOPSY  03/15/2022   Procedure: BIOPSY;  Surgeon: Lanelle Bal, DO;  Location: [facility];  Service: Endoscopy;;   BOWEL RESECTION     x2, secondary to adhesions   CERVICAL SPINE SURGERY     COLONOSCOPY  03/21/2012    NWG:NFAOZH ADENOMA(1) POOR PREP   COLONOSCOPY N/A 11/17/2013   YQM:VHQION mucosa in the terminal ileum/NORMAL surgical anastomosis/Small internal hemorrhoids   COLONOSCOPY WITH PROPOFOL N/A 03/15/2022   Procedure: COLONOSCOPY WITH PROPOFOL;  Surgeon: Lanelle Bal, DO;  Location: [facility];  Service: Endoscopy;  Laterality: N/A;  10:00am   ESOPHAGEAL BRUSHING  03/15/2022   Procedure: ESOPHAGEAL BRUSHING;  Surgeon: Lanelle Bal, DO;  Location: [facility];  Service: Endoscopy;;   ESOPHAGOGASTRODUODENOSCOPY  03/23/2011    Normal esophagus without evidence of Barrett's, mass, erosions, or ulcerations./ Patchy erythema with occasional erosion in the antrum.  Biopsies  obtained via cold forceps to evaluate for H. pylori gastritis/ Small hiatal hernia./Normal duodenal bulb and second portion of the duodenum.   ESOPHAGOGASTRODUODENOSCOPY  03/21/2012   GEX:BMWUX Hiatal hernia/ABDOMINALPAIN/DIARRHEA MOST LIKELY DUE TO IBS, GASTRITIS DUODENITIS   ESOPHAGOGASTRODUODENOSCOPY N/A 02/22/2021   Procedure: ESOPHAGOGASTRODUODENOSCOPY (EGD);  Surgeon: Charlott Rakes, MD;  Location: [facility];  Service: Endoscopy;  Laterality: N/A;   ESOPHAGOGASTRODUODENOSCOPY (EGD) WITH PROPOFOL N/A 06/10/2021   Procedure: ESOPHAGOGASTRODUODENOSCOPY (EGD) WITH PROPOFOL;  Surgeon: Dolores Frame, MD;  Location[facility];  Service: Gastroenterology;  Laterality: N/A;   ESOPHAGOGASTRODUODENOSCOPY (EGD) WITH PROPOFOL N/A 03/15/2022   Procedure: ESOPHAGOGASTRODUODENOSCOPY (EGD) WITH PROPOFOL;  Surgeon: Lanelle Bal, DO;  Location[facility];  Service: Endoscopy;   Laterality: N/A;   FLEXIBLE SIGMOIDOSCOPY  07/14/2002   Normal limited flexible sigmoidoscopy with stool in the rectum and rectosigmoid precluding a full colonoscopy   JOINT REPLACEMENT     LAPAROSCOPIC LYSIS INTESTINAL ADHESIONS     NECK SURGERY  2009   PARTIAL HYSTERECTOMY     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   Procedure: RIGHT TOTAL KNEE ARTHROPLASTY;  Surgeon: Eldred Manges, MD;  Location: [facility] OR;  Service: Orthopedics;  Laterality: Right;   TUBAL LIGATION     UMBILICAL HERNIA REPAIR  2008   x5   UPPER GASTROINTESTINAL ENDOSCOPY  APR 2008 [facility] BLEEDING/PAIN   PUD   UPPER GASTROINTESTINAL ENDOSCOPY  JUL 2012 [facility] PAIN   MILD GASTRITIS    Family History  Adopted: Yes    Social History   Socioeconomic History   Marital status: Divorced    Spouse name: Not on file   Number of children: 1   Years of education: Not on file   Highest education level: Not on file  Occupational History   Occupation: disabled  Tobacco Use   Smoking status: Every Day    Current packs/day: 0.50    Average packs/day: 0.5 packs/day for 10.0 years (5.0 ttl pk-yrs)    Types: Cigarettes   Smokeless tobacco: Never  Vaping Use   Vaping status: Former  Substance and Sexual Activity   Alcohol use: Not Currently    Comment: 05/07/2017 "quit when I was 19; drank for 1 year total"   Drug use: No   Sexual activity: Never    Birth control/protection: Surgical  Other Topics Concern   Not on file  Social History Narrative   Not on file   Social Drivers of Health   Financial Resource Strain: Low Risk  (02/08/2022)   Received from [facility]   Overall Financial Resource Strain (CARDIA)    Difficulty of Paying Living Expenses: Not hard at all  Food Insecurity: Food Insecurity Present (09/12/2023)   Received from [facility]   Hunger Vital Sign    Worried About Running Out of Food in the Last Year: Never true    Ran Out of Food in the  Last Year: Sometimes true  Transportation Needs: No Transportation Needs (09/15/2023)   Received from [facility] - Transportation    Lack of Transportation (Medical): No    Lack of Transportation (Non-Medical): No  Physical Activity: Not on file  Stress: Stress Concern Present (09/12/2023)   Received from [facility] - Occupational Stress Questionnaire    Feeling of Stress : To some extent  Social Connections: Unknown (04/03/2022)   Received from [facility]   Social Network    Social Network: Not on file  Intimate Partner Violence: Not At Risk (09/12/2023)   Received from [facility]   HITS    Over the last 12 months how often did your partner physically hurt you?: Never    Over the last 12 months how  often did your partner insult you or talk down to you?: Never    Over the last 12 months how often did your partner threaten you with physical harm?: Never    Over the last 12 months how often did your partner scream or curse at you?: Never     Physical Exam   Vitals:   12/12/23 0500 12/12/23 0645  BP: 97/62 101/61  Pulse:  81  Resp:  16  Temp:    SpO2:  95%    CONSTITUTIONAL: Chronically ill-appearing, NAD NEURO/PSYCH:  Alert and oriented x 3, no focal deficits EYES:  eyes equal and reactive ENT/NECK:  no LAD, no JVD CARDIO: Regular rate, well-perfused, normal S1 and S2 PULM: Tachypnea, mild retractions, diffuse loud wheezing GI/GU:  non-distended, non-tender MSK/SPINE:  No gross deformities, no edema SKIN:  no rash, atraumatic   *Additional and/or pertinent findings included in MDM below  Diagnostic and Interventional Summary    EKG Interpretation Date/Time:  Thursday December 12 2023 02:49:41 EST Ventricular Rate:  89 PR Interval:  126 QRS Duration:  88 QT Interval:  390 QTC Calculation: 475 R Axis:   41  Text Interpretation: Sinus rhythm Borderline low voltage, extremity leads Confirmed  by Kennis Carina [phone]) on 12/12/2023 5:03:57 AM       Labs Reviewed  CBC - Abnormal; Notable for the following components:      Result Value   WBC 11.1 (*)    Hemoglobin 9.9 (*)    HCT 34.5 (*)    MCH 24.1 (*)    MCHC 28.7 (*)    RDW 19.5 (*)    All other components within normal limits  COMPREHENSIVE METABOLIC PANEL - Abnormal; Notable for the following components:   Potassium 3.2 (*)    Glucose, Bld 112 (*)    Creatinine, Ser 0.42 (*)    Calcium 8.4 (*)    Total Protein 5.9 (*)    Albumin 3.4 (*)    Anion gap 4 (*)    All other components within normal limits  LIPASE, BLOOD  PROTIME-INR  URINALYSIS, ROUTINE W REFLEX MICROSCOPIC  TYPE AND SCREEN  TROPONIN I (HIGH SENSITIVITY)  TROPONIN I (HIGH SENSITIVITY)    CT ABDOMEN PELVIS W CONTRAST  Final Result    DG Chest Port 1 View  Final Result      Medications  ipratropium-albuterol (DUONEB) 0.5-2.5 (3) MG/3ML nebulizer solution 3 mL (3 mLs Nebulization Given 12/12/23 0309)  famotidine (PEPCID) IVPB 20 mg premix (0 mg Intravenous Stopped 12/12/23 0402)  fentaNYL (SUBLIMAZE) injection 50 mcg (50 mcg Intravenous Given 12/12/23 0332)  iohexol (OMNIPAQUE) 300 MG/ML solution 100 mL (100 mLs Intravenous Contrast Given 12/12/23 0510)  ipratropium-albuterol (DUONEB) 0.5-2.5 (3) MG/3ML nebulizer solution 3 mL (3 mLs Nebulization Given 12/12/23 0542)  sodium chloride 0.9 % bolus 1,000 mL (1,000 mLs Intravenous New Bag/Given 12/12/23 0642)  fentaNYL (SUBLIMAZE) injection 50 mcg (50 mcg Intravenous Given 12/12/23 0636)  ondansetron (ZOFRAN) injection 4 mg (4 mg Intravenous Given 12/12/23 0636)     Procedures  /  Critical Care Procedures  ED Course and Medical Decision Making  Initial Impression and Ddx Patient seems to be having a prominent wheezing spell related to her COPD.  Also may be having an upper GI bleed, reports that she has a stomach ulcer.  Reassuring blood pressure, seems well-perfused.  Fairly tachypneic, will provide breathing  treatment, check H&H.  Fair amount of abdominal tenderness and so will need CT imaging to exclude perforated viscus.  Past  medical/surgical history that increases complexity of ED encounter: COPD  Interpretation of Diagnostics I personally reviewed the EKG and my interpretation is as follows: Sinus rhythm without ischemic changes  No significant blood count or electrolyte disturbance.  H&H is lower compared to prior on record from 2 years ago, however there is no BUN elevation that would suggest upper GI bleeding.  And so unclear acuity of this H&H change.  Patient Reassessment and Ultimate Disposition/Management     Patient has had no recurrent episodes of vomiting here in the emergency department, her breathing is much improved after DuoNebs.  Symptoms are better controlled.  CT scan is without emergent pathology, question UTI and so we will follow-up urinalysis.  Patient explains that she has follow-up with GI for her ulcer and she is happy to be discharged home and follow-up.  Patient management required discussion with the following services or consulting groups:  None  Complexity of Problems Addressed Acute illness or injury that poses threat of life of bodily function  Additional Data Reviewed and Analyzed Further history obtained from: Prior labs/imaging results  Additional Factors Impacting ED Encounter Risk Prescriptions and Consideration of hospitalization  Elmer Sow. Pilar Plate, MD [facility] Emergency Medicine [facility] [email or web address]  Final Clinical Impressions(s) / ED Diagnoses     ICD-10-CM   1. Epigastric pain  R10.13     2. Wheezing  R06.2       ED Discharge Orders          Ordered    sucralfate (CARAFATE) 1 g tablet  4 times daily PRN        12/12/23 0656             Discharge Instructions Discussed with and Provided to Patient:     Discharge Instructions      You were evaluated in the Emergency Department and after  careful evaluation, we did not find any emergent condition requiring admission or further testing in the hospital.  Your exam/testing today is overall reassuring.  Wheezing likely related to your COPD.  Continue your inhalers at home as needed.  Continue your Protonix medication twice daily.  Can also use the Carafate as needed for discomfort.  Keep your follow-up with gastroenterology.  Please return to the Emergency Department if you experience any worsening of your condition.   Thank you for allowing [place] to be a part of your care.       Sabas Sous, MD 12/12/23 [phone]

## 2023-12-12 NOTE — ED Notes (Signed)
 Patient transported to CT

## 2023-12-12 NOTE — ED Triage Notes (Signed)
 Pov from home. Cc of stomach ulcer x3 weeks. Says she threw up blood twice tonight. Wheezing in triage. Also c/o SOB form COPD. Requesting Neb.

## 2024-03-19 ENCOUNTER — Other Ambulatory Visit: Payer: Self-pay | Admitting: Internal Medicine

## 2024-05-26 ENCOUNTER — Telehealth: Payer: Self-pay

## 2024-05-26 NOTE — Telephone Encounter (Signed)
 Received referral from Darl Orn DNP at [facility]. Referral is for sleep. Placed in sleep mailbox

## 2024-05-28 NOTE — Telephone Encounter (Signed)
 Due to pt Pulm hx, recommended pt see Pulm for Sleep. Referring office notified via fax

## 2024-07-24 DIAGNOSIS — Z79899 Other long term (current) drug therapy: Secondary | ICD-10-CM | POA: Diagnosis not present

## 2024-07-24 DIAGNOSIS — R0602 Shortness of breath: Secondary | ICD-10-CM | POA: Diagnosis not present

## 2024-07-24 DIAGNOSIS — R5383 Other fatigue: Secondary | ICD-10-CM | POA: Diagnosis not present

## 2024-07-24 DIAGNOSIS — M545 Low back pain, unspecified: Secondary | ICD-10-CM | POA: Diagnosis not present

## 2024-07-24 DIAGNOSIS — R112 Nausea with vomiting, unspecified: Secondary | ICD-10-CM | POA: Diagnosis not present

## 2024-07-24 DIAGNOSIS — M129 Arthropathy, unspecified: Secondary | ICD-10-CM | POA: Diagnosis not present

## 2024-07-24 DIAGNOSIS — K460 Unspecified abdominal hernia with obstruction, without gangrene: Secondary | ICD-10-CM | POA: Diagnosis not present

## 2024-07-24 DIAGNOSIS — Z87891 Personal history of nicotine dependence: Secondary | ICD-10-CM | POA: Diagnosis not present

## 2024-07-24 DIAGNOSIS — R1084 Generalized abdominal pain: Secondary | ICD-10-CM | POA: Diagnosis not present

## 2024-07-24 DIAGNOSIS — Z9189 Other specified personal risk factors, not elsewhere classified: Secondary | ICD-10-CM | POA: Diagnosis not present

## 2024-07-24 DIAGNOSIS — F17210 Nicotine dependence, cigarettes, uncomplicated: Secondary | ICD-10-CM | POA: Diagnosis not present

## 2024-07-27 DIAGNOSIS — Z79899 Other long term (current) drug therapy: Secondary | ICD-10-CM | POA: Diagnosis not present

## 2024-08-02 DIAGNOSIS — J9601 Acute respiratory failure with hypoxia: Secondary | ICD-10-CM | POA: Diagnosis not present

## 2024-08-02 DIAGNOSIS — J441 Chronic obstructive pulmonary disease with (acute) exacerbation: Secondary | ICD-10-CM | POA: Diagnosis not present

## 2024-09-24 ENCOUNTER — Encounter: Payer: Self-pay | Admitting: General Practice

## 2024-10-15 NOTE — Progress Notes (Signed)
 NICS Progress Note [facility] General Medicine Progress Note  Date of Admission:  10/12/2024 Length of Stay:  2 Days  Assessment/Plan:  [name] is a 64 y.o. White or Caucasian [1] female with: Brief H&P: CC: abd pain   History of Present Illness:  [name] is a 64 y.o. female with history of COPD, depression, GERD who presented to the emergency department with abdominal pain and cramping.  Patient's had several days of worsening symptoms.  Was found to be in respiratory distress at psychiatrist appointment so was referred to the ER for further evaluation.  On arrival she was afebrile hemodynamically stable.  Labs were obtained which showed COVID flu negative, WBC 15, hemoglobin 10.5, CMP unrevealing, magnesium  within normal limits, pH 7.43, urinalysis negative for infection.  Patient underwent CT abdomen pelvis which showed ileocolic anastomosis possible mild colitis chest x-ray showed pneumonia.  Patient was started on ceftriaxone and azithromycin and admitted for further workup.   Review of Systems:  All others reviewed and negative.  Assessment: Principal Problem:   Acute respiratory failure with hypoxia (*) Active Problems:   Chronic pain syndrome   Pulmonary emphysema (*)   Anxiety   Iron deficiency anemia   Community acquired bacterial pneumonia   Abdominal pain, unspecified abdominal location  Plan: Acute on chronic hypoxic resp faillure 2/2 Bacterial community acquired pneumonia, likely gram-positive organism: Patient reports that she is on oxygen 2 L at bedtime at home.  Continue Rocephin and doxycycline .  Continue scheduled DuoNebs and as needed DuoNebs.  Added nicotine  patch as she continues to smoke.  Continue PRN cough medications.  Continue incentive spirometry.  RT to evaluate.  She is currently on 2 L nasal cannula.  Wean oxygen as able for goal sat of 90%.  If able to get off oxygen at rest will check exertional room air sats.  Will continue  oxygen HS as she is on at home.  I have asked RN to continue efforts at weaning today. Respiratory BioFire is negative.  She reports that she ran out of her Advair prior to admission as well.  Will need refill on this. CT abdomen pelvis also showed incidentally noted, mildly irregular 11 mm nodule density in the lingula, possibly small inflammatory/infectious amatory.  Recommend CT chest follow-up in 4 to 6 weeks.   Hyperlipidemia: Continue statin   Abdominal pain, most likely secondary to constipation: Placed on bowel regimen upon admission but she refused 2 doses of MiraLAX  as well as 2 doses of Senokot-S 1/6.SABRA  KUB yesterday with constipation with no obstruction.  I discussed with patient need to be compliant with her bowel regimen..  Yesterday added as needed Dulcolax suppository and gave dose of Relistor.  Patient is on chronic Norco 7.5-325 mg every 8 hours as needed at home and reports she follows the pain clinic and has been on this for 5 to 6 months.  Patient complaining of headache and abdominal pain.  Resumed home Norco yesterday to help prevent withdrawal.  I did explain to patient that the Norco is confounding her abdominal pain/constipation.  Of note, patient did get fentanyl  and Norco in the ED.   Today she tells me that she has Linzess at home but only uses it once or twice a month.  This is not on her home medication list.  Have ordered Linzess x 1 dose now.  She reports chronic constipation with usually only 1-2 bowel movements a week. Will need outpatient CT abdomen pelvis in 4  to 6 weeks per radiology recommendations.   Depression/anxiety: Continue venlafaxine, Cymbalta , Zyprexa , and as needed Xanax .   Chronic pain/headache: Continue gabapentin  60 mg p.o. 3 times daily and Cymbalta  30 mg daily.  As above, resumed PRN Norco. Patient complaining of headache as well.  Noted that Maxalt used to be on her home medicine list in the past but no longer on it. Added PRN Imitrex and as  needed Compazine  1/6.   GERD: Continue Protonix .   Insomnia: Continue Zyprexa   Anemia, present on admission, iron deficiency/Thrombocytosis Denies bleeding.  Platelets elevated and may be secondary to acute infection.  Hemoglobin is trended down after IV fluids and is consistent with prior checks.  Hemoglobin remains an 8.8.  Platelets continue to trend down.  Lab holiday tomorrow.     Nutritional status: Body mass index is 24.07 kg/m. Normal Weight Plan:n/aConsistent Carbohydrate 2 gm NA. Check A1C.      Fluids, electrolytes, nutrition Fluids: SLIV, monitor lytes and replete prn, Diet: ADA  Prophylaxis Prophylaxis:    DVT: SCDs   GI:  PPI  General  Ethics: Full Code  Surrogate decision maker   PCP: No primary care provider on file. None        Disposition Pending.  Possible discharge home in the next 1 to 2 days.  Discussed plan of care with patient and RN      Subjective/Events Overnight: Patient reports cough and shortness of breath.  Reports intermittent wheezing.  Continues to smoke.  Reports she is on oxygen 2 L at bedtime.  She has not had a bowel movement yet.  Still eating well.  Still having lower abdominal cramping.  Denies emesis.  Could not tell me when the last bowel movement she had was.  Reports that she typically only has 1-2 bowel movements a week.  Reports that her breathing is improved.  Objective   Vital signs in last 24 hours: Intake/Output:  Temp:  [97.3 F (36.3 C)-98.7 F (37.1 C)] 98.7 F (37.1 C) Heart Rate:  [66-98] 66 Resp:  [20] 20 BP: (93-104)/(47-66) 102/58 SpO2:  [89 %-100 %] 99 %   Wt Readings from Last 1 Encounters:  10/12/24 63.6 kg (140 lb 3.4 oz)     Intake/Output Summary (Last 24 hours) at 10/15/2024 1322 Last data filed at 10/15/2024 9182 Gross per 24 hour  Intake 620 ml  Output --  Net 620 ml     Physical Exam   Constitutional -chronically ill-appearing Eyes - pupils equal round and reactive to light and  accomodation Nose - no gross deformity or drainage Mouth - no oral lesions noted Throat - no swelling or erythema Neck - supple, no JVD   CV - (+)S1S2, no murmurs, no lower extremity edema Resp -nasal cannula 2 L, no cough on exam.  Still has scattered rhonchi and mild end expiratory wheezing.  Less tachypneic today.   GI - (+)BS, soft, mild generalized tenderness.  No guarding, no rebound Extremities- no clubbing, cyanosis, or peripheral edema  Skin - no rashes or wounds Neuro - alert, aware, oriented to person/place/time  Psych - normal affect, not currently anxious     Recent Results (from the past 24 hours)  CBC   Collection Time: 10/15/24  7:01 AM  Result Value Ref Range   WBC 7.6 4.0 - 10.5 thou/mcL   RBC 4.31 3.93 - 5.22 million/mcL   HGB 8.8 (L) 11.2 - 15.7 gm/dL   HCT 67.4 (L) 65.8 - 55.0 %   MCV 75.4 (  L) 79.4 - 94.8 fL   MCH 20.4 (L) 25.6 - 32.2 pg   MCHC 27.1 (L) 32.2 - 35.5 gm/dL   Plt Ct 492 (H) 849 - 400 thou/mcL   RDW SD 49.8 (H) 35.1 - 46.3 fL   MPV 10.3 9.4 - 12.4 fL   NRBC% 0.0 0.0 - 0.2 /100WBC   Absolute NRBC Count 0.00 0.00 - 0.01 thou/mcL  Basic Metabolic Panel   Collection Time: 10/15/24  7:02 AM  Result Value Ref Range   Na 139 136 - 146 mmol/L   Potassium 4.2 3.7 - 5.4 mmol/L   Cl 102 97 - 108 mmol/L   CO2 31 20 - 32 mmol/L   AGAP 6 (L) 7 - 16 mmol/L   Glucose 106 (H) 65 - 99 mg/dL   BUN 18 8 - 27 mg/dL   Creatinine 9.59 (L) 9.42 - 1.00 mg/dL   Ca 8.7 8.6 - 89.7 mg/dL   BUN/CREAT RATIO 54.9 (H) 11.0 - 26.0   eGFR 110 >=60 mL/min/1.83m2   Diagnostic studies: CT Abdomen Pelvis WO IV Contrast Result Date: 09/17/2024 Exam: CT of the Abdomen and Pelvis without Contrast History: Abdominal pain. Technique: Routine CT of the abdomen and pelvis without IV contrast. AEC (automated exposure control) and/or manual techniques such as size-specific kV and mAs are employed where appropriate to reduce radiation exposure for all CT exams. Comparison:  CTs of the  abdomen and pelvis dated 08/14/2024 and 12/27/2023 Findings: LOWER THORAX:  No new airspace disease identified. No pleural effusion. LIVER:  The liver is normal in size, contour, and attenuation. BILIARY: No radiopaque stones or sludge. No evidence of cholecystitis. No significant intra- or extrahepatic biliary ductal dilation. PANCREAS: The pancreas is normal in volume and attenuation. SPLEEN: Normal size. ADRENALS:  Normal morphology. KIDNEYS/URETERS:  No hydroureteronephrosis. No nephroureteral calculus. No suspicious lesion. BOWEL:  A moderate to large hiatal hernia is present. The stomach is contrast and debris filled. The small bowel and colon are normal in caliber. The appendix is poorly visualized, likely absent on a surgical basis. VASCULAR:  The abdominal aorta is normal in caliber. LYMPH NODES:  No lymphadenopathy. PERITONEUM: No ascites. PELVIC ORGANS:  The bladder is partially decompressed and unremarkable in appearance. The uterus is absent on a surgical basis. BONES:  The patient is status post posterior pedicle screw fusion within the lower lumbosacral spine at L4-S1. There is no acute osseous pathology identified. SOFT TISSUES: Normal.  1.    No acute intra-abdominal or pelvic pathology identified. 2.    Moderate to large hiatal hernia. Signed (Electronic Signature): 09/17/2024 9:38 PM Signed By: Cleatus SHAUNNA Brazier, MD  CT Abdomen Pelvis W IV Contrast Result Date: 10/12/2024 INDICATION: Abdominal Pain. COMPARISON: CTA chest 09/23/2023 TECHNIQUE:  CT ABDOMEN PELVIS W IV CONTRAST - :  60 mL mL of Isovue  370 was administered intravenously. Radiation dose reduction was utilized (automated exposure control, mA or kV adjustment based on patient size, or iterative image reconstruction). FINDINGS: VISUALIZED LOWER THORAX: Mild patchy linear bibasilar opacities, probably atelectasis/scarring. Similar appearing emphysematous changes with lucent bleb in the anterior left lung base. There is a mildly  irregular 11 mm nodular density in the lingula, series 4 image 30. 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 Moderate to large hiatal hernia again noted. Small pericardial effusion. SOLID VISCERA: Liver: Normal. Gallbladder: Unremarkable. Pancreas: Normal. Adrenal glands: Normal. Spleen: Normal. Kidneys: No acute findings. Tiny probable right renal cyst, too small to characterize. GI: No bowel obstruction. Moderate amount of  stool throughout the colon and rectum. The cecum appears to be within the right and posterior central pelvis. There appears to be an ileocolic anastomosis, and prior appendectomy. There appears to be wall thickening and mild inflammatory changes involving the cecum, series 2, image 149. There appear to be redundant loops of distal sigmoid colon with rectosigmoid surgical anastomosis. There appears to be  mild wall thickening and inflammatory changes involving a short segment of sigmoid colon in the left pelvis, series 2 image 142. There appears to be a small bowel surgical anastomosis in the anterior left mid abdomen. PERITONEAL CAVITY/RETROPERITONEUM: No free fluid. No pneumoperitoneum. No lymphadenopathy. Aorta, IVC, iliac arteries, and major visceral arteries are grossly normal. Mild calcified atherosclerotic disease. PELVIS: Partially decompressed urinary bladder. Prior hysterectomy. MUSCULOSKELETAL: Mild osteopenia. Multilevel degenerative changes in the visualized spine. Status post L4-S1 lumbosacral fusion with metallic surgical hardware. Mild arthritis of both hips. MISC: N/A.   IMPRESSION: 1.  The cecum appears to be within the right and posterior central pelvis with associated postoperative changes, with what appears to be an ileocolic anastomosis and prior appendectomy. There appears to be wall thickening and mild inflammatory changes involving the cecum, possibly representing mild acute colitis. 2.  Redundant loops of distal sigmoid colon with rectosigmoid  surgical anastomosis. There appears to be mild wall thickening and inflammatory changes involving a short segment of sigmoid colon in the left pelvis, possibly representing mild acute colitis. 3.  Recommend short interval follow-up CT abdomen/pelvis, preferably with IV contrast. 4.  Moderate amount of stool throughout the colon and rectum. 5.  Moderate to large hiatal hernia. 6.  Small pericardial effusion. 7.  Incidentally noted, mildly irregular 11 mm nodular density in the lingula, series 4 image 30, possibly a small inflammatory/infectious infiltrate. 8.  Recommend short interval follow-up chest CT in 4-6 weeks. 9.  Other findings as described Electronically Signed by: Donnice Mains, MD on 10/12/2024 4:00 PM  XR Chest Ap Portable Result Date: 10/12/2024 TECHNIQUE: XR CHEST AP PORTABLE INDICATION: Shortness of breath Respiratory Distress COMPARISON: Portable chest radiograph 07/02/2024 FINDINGS: Cardiomediastinal silhouette is within normal limits.  Right IJ approach Port-A-Cath terminating in the distal SVC. Hazy right lower lung airspace opacities. No pleural effusion or pneumothorax. Unremarkable appearance of the upper abdomen.  No acute osseous abnormalities. Partially imaged ACDF hardware.   IMPRESSION: Pneumonia. Electronically Signed by: Curtistine Hummer on 10/12/2024 2:16 PM   Other pertinent labs:   Pertinent Radiological findings (summarize): I reviewed admission imaging studies.  Telemetry: none  This note was dictated with voice recognition software. Similar sounding words can inadvertently be transcribed and may not be corrected upon review.    Venetia LOISE Salt, MD 10/15/2024 1:22 PM

## 2024-10-16 NOTE — Discharge Summary (Signed)
 [facility]  Discharge Summary  PCP: No primary care provider on file. Discharge Details   Admit date:         10/12/2024 Discharge date and time:       10/16/2024 Hospital LOS:    4 days  Active Hospital Problems   Diagnosis Date Noted POA   *Acute respiratory failure with hypoxia (*) 09/12/2023 Yes   Community acquired bacterial pneumonia 10/13/2024 Yes   Abdominal pain, unspecified abdominal location 10/13/2024 Unknown   Iron deficiency anemia 06/28/2024 Yes   Anxiety 06/28/2024 Yes   Pulmonary emphysema (*) 06/28/2024 Yes   Chronic pain syndrome 09/16/2023 Yes    Resolved Hospital Problems  No resolved problems to display.      Current Discharge Medication List     START taking these medications      Details  acetaminophen  325 mg tablet Commonly known as: TYLENOL   Take two tablets (650 mg dose) by mouth every 6 (six) hours as needed for Pain (T>101F, HAs and pain 1-10) for up to 10 days. Quantity: 30 tablet   bisacodyl  10 mg suppository Commonly known as: BISCOLAX,DULCOLAX  Place one suppository (10 mg dose) rectally daily as needed for up to 30 days. Quantity: 12 suppository   cefdinir 300 mg capsule Commonly known as: OMNICEF  Take one capsule (300 mg dose) by mouth 2 (two) times daily for 3 days. Quantity: 6 capsule   doxycycline  monohydrate 100 mg capsule Commonly known as: MONODOX   Take one capsule (100 mg dose) by mouth every 12 (twelve) hours for 2 doses. Indication: Community Acquired Pneumonia Quantity: 2 capsule   guaiFENesin  100 mg/5 mL Soln Commonly known as: SM TUSSIN MUCUS,ROBITUSSIN  Take 10 mLs (200 mg dose) by mouth every 4 (four) hours as needed. Quantity: 236 mL   polyethylene glycol 17 g packet Commonly known as: MIRALAX   Dissolve 17 grams (1 packet) in liquid and drink by mouth 2 times a day as needed. Quantity: 30 each   sennosides-docusate sodium  8.6-50 mg per tablet Commonly  known as: SENOKOT-S  Take two tablets by mouth daily. Quantity: 60 tablet       CONTINUE these medications which have CHANGED      Details  * albuterol  0.63 mg/3 mL nebulizer solution Commonly known as: ACCUNEB  What changed: Another medication with the same name was removed. Continue taking this medication, and follow the directions you see here.  Take 3 mLs (0.63 mg dose) by nebulization 4 (four) times a day as needed for Wheezing or Shortness of Breath.   * albuterol  sulfate HFA 108 (90 Base) MCG/ACT inhaler Commonly known as: PROAIR  HFA What changed: Another medication with the same name was removed. Continue taking this medication, and follow the directions you see here.  Inhale one puff into the lungs every 4 (four) hours as needed for Wheezing. Indication: Spasm of Lung Air Passages, Chronic Obstructive Lung Disease Quantity: 8 g      * * This list has 2 medication(s) that are the same as other medications prescribed for you. Read the directions carefully, and ask your doctor or other care provider to review them with you.          CONTINUE these medications which have NOT CHANGED      Details  ALPRAZolam  1 mg tablet Commonly known as: XANAX   Take one half tablet (0.5 mg dose) by mouth 2 (two) times a day as needed for Anxiety or Sleep.   atorvastatin 80 mg  tablet Commonly known as: LIPITOR  Take one tablet (80 mg dose) by mouth daily.   desvenlafaxine 100 MG 24 hr tablet Commonly known as: PRISTIQ  Take one tablet (100 mg dose) by mouth every evening.   DULoxetine  HCl 30 mg capsule Commonly known as: CYMBALTA   Take one capsule (30 mg dose) by mouth every morning.   fluticasone-salmeterol 250-50 mcg/dose Aepb inhalation powder Commonly known as: ADVAIR DISKUS/WIXELA  Inhale one puff into the lungs 2 (two) times a day as needed (SHORTNESS OF BREATH). Quantity: 60 each   gabapentin  600 mg tablet Commonly known as: NEURONTIN   Take one tablet (600 mg dose) by  mouth 3 (three) times a day.   HYDROcodone -acetaminophen  7.5-325 mg per tablet Commonly known as: NORCO  Take one tablet by mouth 3 (three) times a day as needed for Pain.   hyoscyamine  sulfate 0.125 mg SL tablet Commonly known as: LEVSIN /SL  Take one tablet (0.125 mg dose) by mouth every 4 (four) hours as needed for Cramping. Quantity: 120 tablet   ipratropium-albuterol  0.5-2.5 mg/3 mL ML Soln nebulizer solution Commonly known as: DUONEB  Inhale 3 mLs into the lungs see administration instructions. EVRY 6 TO 8 HOURS AS NEEDED FOR SHORTNESS OF BREATH   Nicotine  21-14-7 MG/24HR Kit  Apply 1 patch topically daily.   OLANZapine  7.5 mg tablet Commonly known as: ZYPREXA   Take one tablet (7.5 mg dose) by mouth at bedtime.   * ondansetron  4 mg tablet Commonly known as: ZOFRAN   Take one tablet (4 mg dose) by mouth every 6 (six) hours as needed.   * ondansetron  8 mg tablet Commonly known as: ZOFRAN   Take one tablet (8 mg dose) by mouth at bedtime as needed.   OXYGEN  Inhale 2 L/min into the lungs at bedtime.   pantoprazole  sodium 40 mg tablet Commonly known as: PROTONIX   Take one tablet (40 mg dose) by mouth 2 (two) times daily for 30 days. Quantity: 60 tablet   propranolol  HCl 10 mg tablet Commonly known as: INDERAL   Take one tablet (10 mg dose) by mouth 2 (two) times daily.   tizanidine 2 MG tablet Commonly known as: ZANAFLEX  Take one tablet (2 mg dose) by mouth every 8 (eight) hours as needed (MUSCLE SPASMS).      * * This list has 2 medication(s) that are the same as other medications prescribed for you. Read the directions carefully, and ask your doctor or other care provider to review them with you.         * You might also be taking other medications not listed above. If you have questions about any of your other medications, talk to the person who prescribed them or your Primary Care Provider.          STOP taking these medications    cetirizine 10 mg  tablet Commonly known as: ZYRTEC   meclizine HCl 12.5 mg tablet Commonly known as: ANTIVERT   ondansetron  4 mg disintegrating tablet Commonly known as: ZOFRAN -ODT   predniSONE  10 mg tablet Commonly known as: DELTASONE    promethazine  25 MG tablet Commonly known as: PHENERGAN    rizatriptan 10 MG tablet Commonly known as: MAXALT   sertraline 50 mg tablet Commonly known as: ZOLOFT   sucralfate  1 g tablet Commonly known as: CARAFATE    traZODone  150 MG tablet Commonly known as: DESYREL        Hospital Course   Indication for Admission:  Brief H&P: CC: abd pain   History of Present Illness:  [name] is a 64 y.o. female with history of COPD, depression, GERD who presented to the emergency department with abdominal pain and cramping.  Patient's had several days of worsening symptoms.  Was found to be in respiratory distress at psychiatrist appointment so was referred to the ER for further evaluation.  On arrival she was afebrile hemodynamically stable.  Labs were obtained which showed COVID flu negative, WBC 15, hemoglobin 10.5, CMP unrevealing, magnesium  within normal limits, pH 7.43, urinalysis negative for infection.  Patient underwent CT abdomen pelvis which showed ileocolic anastomosis possible mild colitis chest x-ray showed pneumonia.  Patient was started on ceftriaxone and azithromycin and admitted for further workup.   Review of Systems:  All others reviewed and negative.  Hospital Course:   Acute on chronic hypoxic resp faillure 2/2 Bacterial community acquired pneumonia, likely gram-positive organism: Patient reports that she is on oxygen 2 L at bedtime at home.  Patient continue Rocephin and doxycycline  in addition.  Will transition to cefdinir for 3 more days and will complete 5-day course of doxycycline .  Patient was on scheduled DuoNebs during admission.   Added nicotine  patch as she continues to smoke.  She reports that she has nicotine  patches already at  home but does not need a prescription for this.  Patient also PRN guaifenesin .  Patient reports that she is on 2 L oxygen at bedtime at baseline.  Will continue this.  Patient is not requiring oxygen at rest or with exertion.  PCP please reassess oxygen requirements at follow-up appointment..  Patient had incentive spirometry during admission.  RT followed during admission.  I provided smoking cessation counseling.  Have asked care connection to arrange follow-up with PCP within 7 days.  [facility] as she reports that she ran out of it.   Respiratory BioFire is negative.   CT abdomen pelvis also showed incidentally noted, mildly irregular 11 mm nodule density in the lingula, possibly small inflammatory/infectious amatory.  Recommend CT chest and Abdomen Pelvis follow-up in 4 to 6 weeks.   Hyperlipidemia: Continue statin   Abdominal pain, most likely secondary to constipation: Placed on bowel regimen upon admission but she refused 2 doses of MiraLAX  as well as 2 doses of Senokot-S 1/6.SABRA  KUB  with constipation with no obstruction.  I discussed with patient need to be compliant with her bowel regimen.. Patient received dose of Relistor on 1/7.  Patient reports that she has chronic constipation typically only has a bowel movement once or twice a week.   Patient is on chronic Norco 7.5-325 mg every 8 hours as needed at home and reports she follows the pain clinic and has been on this for 5 to 6 months.  Will have patient on Senokot-S 2 tablets p.o. daily, twice daily.  MiraLAX .  Patient also reported that she has Linzess at home and typically only uses it once or twice a month.  I encouraged her to continue to use this medication as needed.  Her abdomen is easily compressible.  No nausea or vomiting.  She is tolerating her food.  Of note the Linzess was not on her home medication list.  Continue PRN Zofran  which she is on at home.   Will need outpatient CT abdomen  pelvis in 4 to 6 weeks per radiology recommendations.   Depression/anxiety: Continue venlafaxine, Cymbalta , Zyprexa , and as needed Xanax .   Chronic pain/headache: Continue gabapentin  600 mg p.o. 3 times daily and Cymbalta  30 mg  daily.  As above, resumed PRN Norco. Patient complaining of headache as well.  Noted that Maxalt used to be on her home medicine list in the past but no longer on it.  Patient received PRN Imitrex and Compazine  during admission.  This is improved.  Continue home pain regimen at discharge.   GERD: Continue Protonix .   Insomnia: Continue Zyprexa    Anemia, present on admission, iron deficiency/Thrombocytosis Denies bleeding.  Platelets elevated and may be secondary to acute infection.  Hemoglobin is trended down after IV fluids and is consistent with prior checks.  Hemoglobin remains at 8.8 with repeat check and Platelets continued to trend down at last check.  Recommend CBC at follow-up with PCP.  Platelets continue to trend down.          Bedside Procedures   No orders found     Labs on Discharge:  Recent Labs    Units 10/15/24 0701 10/14/24 0509 10/12/24 1310  WBC thou/mcL 7.6 7.3 15.0*  HGB gm/dL 8.8* 8.8* 89.4*  HCT % 32.5* 33.3* 37.6  PLT thou/mcL 507* 575* 777*    Recent Labs    Units 10/15/24 0702 10/14/24 0509 10/12/24 1310  NA mmol/L 139 140 137  K mmol/L 4.2 4.3 4.2  CL mmol/L 102 105 99  CO2 mmol/L 31 27 25   BUN mg/dL 18 25 23   CREATININE mg/dL 9.59* 9.59* 9.59*  CALCIUM mg/dL 8.7 8.6 9.8   No results for input(s): TSH, HBA1C in the last 168 hours. Recent Labs    Units 10/12/24 1310  BILITOT mg/dL 0.4  AST U/L 17  ALT U/L 11  ALKPHOS U/L 88  ALBUMIN gm/dL 4.5   No results for input(s): LABPROT, INR, PTT in the last 168 hours.  No results for input(s): CHOL, LDL, HDL, TRIG in the last 168 hours. No results found for: CKMB, TROPONINI  Diagnostics:  CT Abdomen Pelvis WO IV Contrast Result Date:  09/17/2024 Exam: CT of the Abdomen and Pelvis without Contrast History: Abdominal pain. Technique: Routine CT of the abdomen and pelvis without IV contrast. AEC (automated exposure control) and/or manual techniques such as size-specific kV and mAs are employed where appropriate to reduce radiation exposure for all CT exams. Comparison:  CTs of the abdomen and pelvis dated 08/14/2024 and 12/27/2023 Findings: LOWER THORAX:  No new airspace disease identified. No pleural effusion. LIVER:  The liver is normal in size, contour, and attenuation. BILIARY: No radiopaque stones or sludge. No evidence of cholecystitis. No significant intra- or extrahepatic biliary ductal dilation. PANCREAS: The pancreas is normal in volume and attenuation. SPLEEN: Normal size. ADRENALS:  Normal morphology. KIDNEYS/URETERS:  No hydroureteronephrosis. No nephroureteral calculus. No suspicious lesion. BOWEL:  A moderate to large hiatal hernia is present. The stomach is contrast and debris filled. The small bowel and colon are normal in caliber. The appendix is poorly visualized, likely absent on a surgical basis. VASCULAR:  The abdominal aorta is normal in caliber. LYMPH NODES:  No lymphadenopathy. PERITONEUM: No ascites. PELVIC ORGANS:  The bladder is partially decompressed and unremarkable in appearance. The uterus is absent on a surgical basis. BONES:  The patient is status post posterior pedicle screw fusion within the lower lumbosacral spine at L4-S1. There is no acute osseous pathology identified. SOFT TISSUES: Normal.  1.    No acute intra-abdominal or pelvic pathology identified. 2.    Moderate to large hiatal hernia. Signed (Electronic Signature): 09/17/2024 9:38 PM Signed By: Cleatus SHAUNNA Brazier, MD  XR Abdomen Ap Result Date:  10/14/2024 TECHNIQUE: 2 View KUB INDICATION: Abdominal pain. COMPARISON: 10/02/2023. FINDINGS: Mild colonic fecal retention. No bowel obstruction or free air. No abnormal calcifications over the abdomen  or pelvis. No acute bone or soft tissue findings.. Postop change in the spine.   IMPRESSION: Constipation. Electronically Signed by: Sonny Appl, MD on 10/14/2024 8:49 AM  CT Abdomen Pelvis W IV Contrast Result Date: 10/12/2024 INDICATION: Abdominal Pain. COMPARISON: CTA chest 09/23/2023 TECHNIQUE:  CT ABDOMEN PELVIS W IV CONTRAST - :  60 mL mL of Isovue  370 was administered intravenously. Radiation dose reduction was utilized (automated exposure control, mA or kV adjustment based on patient size, or iterative image reconstruction). FINDINGS: VISUALIZED LOWER THORAX: Mild patchy linear bibasilar opacities, probably atelectasis/scarring. Similar appearing emphysematous changes with lucent bleb in the anterior left lung base. There is a mildly irregular 11 mm nodular density in the lingula, series 4 image 30. 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 Moderate to large hiatal hernia again noted. Small pericardial effusion. SOLID VISCERA: Liver: Normal. Gallbladder: Unremarkable. Pancreas: Normal. Adrenal glands: Normal. Spleen: Normal. Kidneys: No acute findings. Tiny probable right renal cyst, too small to characterize. GI: No bowel obstruction. Moderate amount of stool throughout the colon and rectum. The cecum appears to be within the right and posterior central pelvis. There appears to be an ileocolic anastomosis, and prior appendectomy. There appears to be wall thickening and mild inflammatory changes involving the cecum, series 2, image 149. There appear to be redundant loops of distal sigmoid colon with rectosigmoid surgical anastomosis. There appears to be  mild wall thickening and inflammatory changes involving a short segment of sigmoid colon in the left pelvis, series 2 image 142. There appears to be a small bowel surgical anastomosis in the anterior left mid abdomen. PERITONEAL CAVITY/RETROPERITONEUM: No free fluid. No pneumoperitoneum. No lymphadenopathy. Aorta, IVC, iliac arteries,  and major visceral arteries are grossly normal. Mild calcified atherosclerotic disease. PELVIS: Partially decompressed urinary bladder. Prior hysterectomy. MUSCULOSKELETAL: Mild osteopenia. Multilevel degenerative changes in the visualized spine. Status post L4-S1 lumbosacral fusion with metallic surgical hardware. Mild arthritis of both hips. MISC: N/A.   IMPRESSION: 1.  The cecum appears to be within the right and posterior central pelvis with associated postoperative changes, with what appears to be an ileocolic anastomosis and prior appendectomy. There appears to be wall thickening and mild inflammatory changes involving the cecum, possibly representing mild acute colitis. 2.  Redundant loops of distal sigmoid colon with rectosigmoid surgical anastomosis. There appears to be mild wall thickening and inflammatory changes involving a short segment of sigmoid colon in the left pelvis, possibly representing mild acute colitis. 3.  Recommend short interval follow-up CT abdomen/pelvis, preferably with IV contrast. 4.  Moderate amount of stool throughout the colon and rectum. 5.  Moderate to large hiatal hernia. 6.  Small pericardial effusion. 7.  Incidentally noted, mildly irregular 11 mm nodular density in the lingula, series 4 image 30, possibly a small inflammatory/infectious infiltrate. 8.  Recommend short interval follow-up chest CT in 4-6 weeks. 9.  Other findings as described Electronically Signed by: Donnice Mains, MD on 10/12/2024 4:00 PM  XR Chest Ap Portable Result Date: 10/12/2024 TECHNIQUE: XR CHEST AP PORTABLE INDICATION: Shortness of breath Respiratory Distress COMPARISON: Portable chest radiograph 07/02/2024 FINDINGS: Cardiomediastinal silhouette is within normal limits.  Right IJ approach Port-A-Cath terminating in the distal SVC. Hazy right lower lung airspace opacities. No pleural effusion or pneumothorax. Unremarkable appearance of the upper abdomen.  No acute osseous abnormalities. Partially  imaged ACDF  hardware.   IMPRESSION: Pneumonia. Electronically Signed by: Curtistine Hummer on 10/12/2024 2:16 PM     Post Hospital Care   Discharge Procedure Orders  Follow up with Primary Care Physician:  Standing Status: Future  Referral Priority: Routine Referral Type: Consultation  Referral Reason: Evaluate and Return  Number of Visits Requested: 1 Expiration Date: 04/13/25   Regular Diet   Notify physician - Temp  Order Comments: Call MD if Temperature above 100.5  Degrees F   Notify Physician for trouble breathing or symptoms that are worse   Notify physician - Call your doctor if you experience nausea/vomiting   Notify Physician for increased abdominal swelling or bloating   Notify Physician for increased shortness of breath   Notify Physician for change in color/consistency of sputum   Notify Physician for worsening diarrhea   Activity as tolerated   Discharge instructions  Order Comments: Please stop smoking.   Unresulted [employer] Current Status   Culture, Blood Blood Arm, Left Preliminary result   Culture, Blood Blood Hand, Right Preliminary result      Recommendations to physicians: Please see above.  Patient will need repeat CT chest, abdomen, and pelvis in 4 to 6 weeks as mentioned above.  Recommend CBC and BMP.  Provide further smoking cessation counseling. Potential for Rehab:        Fair Code Status:   Full Code  Followup appointments:  PCP: Someone to call with follow-up appointment.  Time spent in discharge process: 40 minutes  This note was dictated with voice recognition software. Similar sounding words can inadvertently be transcribed and may not be corrected upon review.    Electronically signed: Venetia LOISE Salt, MD 10/16/2024 / 10:47 AM   *Some images could not be shown.

## 2024-10-16 NOTE — Nursing Note (Signed)
 BP 94/56 manually. Patient asymptomatic. Dr. Ozell Duane notified, no new orders. Will continue to monitor.

## 2024-10-17 NOTE — ED Notes (Signed)
 Pt discharged home with family.  Pt verbalized understanding of discharge instructions.  IV removed, Pt A&O x4 with steady gait and in no respiratory distress upon discharge.

## 2024-10-17 NOTE — ED Triage Notes (Signed)
 BIB [facility] for coffee ground emesis and having SOB. Actively vomiting upon arrival to ER

## 2024-10-18 NOTE — ED Triage Notes (Signed)
 Patient is from home for uncontrolled nausea and vomiting. Patient states she tried the take home Zofran , but no relief. Patient was just seen and discharged from ED earlier in the night.

## 2024-10-18 NOTE — Nursing Note (Signed)
 Attempted to call [facility] for transport home, left message and callback number.

## 2024-10-19 ENCOUNTER — Telehealth: Payer: Self-pay

## 2024-10-19 NOTE — Transitions of Care (Post Inpatient/ED Visit) (Signed)
" ° °  10/19/2024  Name: [name] MRN: [id number] DOB: 13-Jun-1961  Today's TOC FU Call Status: Today's TOC FU Call Status:: Successful TOC FU Call Completed TOC FU Call Complete Date: 10/19/24  Patient's Name and Date of Birth confirmed. Name, DOB  Transition Care Management Follow-up Telephone Call Date of Discharge: 10/16/24 Discharge Facility: Other [facility]) Name of Other (Non[facility]) Discharge Facility: [facility] Type of Discharge: Inpatient Admission Primary Inpatient Discharge Diagnosis:: Acute on chronic hypoxic resp faillure 2/2 Bacterial community acquired pneumonia How have you been since you were released from the hospital?: Better Any questions or concerns?: No  Items Reviewed: Did you receive and understand the discharge instructions provided?: Yes Medications obtained,verified, and reconciled?: No (Patinet refused to review medications.  States she has all her medications) Any new allergies since your discharge?: No Dietary orders reviewed?: Yes Type of Diet Ordered:: Low sodium Do you have support at home?: Yes People in Home [RPT]: alone, sibling(s) Name of Support/Comfort Primary Source: Debby  Medications Reviewed Today: Patient refused to review medications Medications Reviewed Today   Medications were not reviewed in this encounter     Home Care and Equipment/Supplies: Were Home Health Services Ordered?: No Any new equipment or medical supplies ordered?: No  Functional Questionnaire: Do you need assistance with bathing/showering or dressing?: Yes (Patient states she has a home health aide to assist with bathing 5 days a week.) Do you need assistance with meal preparation?: No Do you need assistance with eating?: No Do you have difficulty maintaining continence: No Do you need assistance with getting out of bed/getting out of a chair/moving?: No Do you have difficulty managing or taking your medications?:  No  Follow up appointments reviewed: PCP Follow-up appointment confirmed?: No (Patient states she knows she needs to call PCP for follow up and will.  Declined assistance) MD Provider Line Number:[phone] Given: No Specialist Hospital Follow-up appointment confirmed?: NA Do you need transportation to your follow-up appointment?: No (Patient still drives.) Do you understand care options if your condition(s) worsen?: Yes-patient verbalized understanding  SDOH Interventions Today    Flowsheet Row Most Recent Value  SDOH Interventions   Food Insecurity Interventions Intervention Not Indicated  Housing Interventions Intervention Not Indicated  Transportation Interventions Intervention Not Indicated  Utilities Interventions Intervention Not Indicated    Larkin Alfred J. Shaquela Weichert RN, MSN [facility]  [facility], [facility] RN Care Manager Direct Dial: [phone]  Fax: [phone] Website: [email or web address]   "

## 2024-10-19 NOTE — Patient Instructions (Signed)
 Visit Information  Thank you for taking time to visit with me today. Please don't hesitate to contact me if I can be of assistance to you    Pneumonia: Contact physician if: You have a new or higher fever. You are coughing more deeply or more often. You are having increased shortness of breath. You are not getting better after 2 days (48 hours). You do not get better as expected.  The patient verbalized understanding of instructions, educational materials, and care plan provided today and DECLINED offer to receive copy of patient instructions, educational materials, and care plan.   The patient has been provided with contact information for the care management team and has been advised to call with any health related questions or concerns.   Please call the care guide team at [phone] if you need to cancel or reschedule your appointment.   Please call the Suicide and Crisis Lifeline: 988 if you are experiencing a Mental Health or Behavioral Health Crisis or need someone to talk to.  Isatu Macinnes J. Dewain Platz RN, MSN [facility], [facility] RN Care Manager Direct Dial: [phone]  Fax: [phone] Website: [email or web address]

## 2024-10-19 NOTE — Telephone Encounter (Signed)
 This encounter was created in error - please disregard.
